# Patient Record
Sex: Male | Born: 1985 | Smoking: Current some day smoker
Health system: Southern US, Community
[De-identification: ages and names within clinical notes are randomized; demographics above are authoritative.]

## PROBLEM LIST (undated history)

## (undated) DIAGNOSIS — B20 Human immunodeficiency virus [HIV] disease: Secondary | ICD-10-CM

## (undated) DIAGNOSIS — E119 Type 2 diabetes mellitus without complications: Secondary | ICD-10-CM

## (undated) DIAGNOSIS — F209 Schizophrenia, unspecified: Secondary | ICD-10-CM

## (undated) DIAGNOSIS — Z21 Asymptomatic human immunodeficiency virus [HIV] infection status: Secondary | ICD-10-CM

## (undated) DIAGNOSIS — B029 Zoster without complications: Secondary | ICD-10-CM

## (undated) DIAGNOSIS — F319 Bipolar disorder, unspecified: Secondary | ICD-10-CM

## (undated) HISTORY — PX: IRRIGATION AND DEBRIDEMENT FOOT: SHX6602

## (undated) HISTORY — DX: Human immunodeficiency virus (HIV) disease: B20

## (undated) HISTORY — DX: Asymptomatic human immunodeficiency virus (hiv) infection status: Z21

---

## 2007-03-13 ENCOUNTER — Emergency Department (HOSPITAL_COMMUNITY): Admission: EM | Admit: 2007-03-13 | Discharge: 2007-03-13 | Payer: Self-pay | Admitting: Emergency Medicine

## 2016-08-14 ENCOUNTER — Encounter (INDEPENDENT_AMBULATORY_CARE_PROVIDER_SITE_OTHER): Payer: Self-pay

## 2016-08-14 ENCOUNTER — Ambulatory Visit (INDEPENDENT_AMBULATORY_CARE_PROVIDER_SITE_OTHER): Payer: No Typology Code available for payment source | Admitting: Family Medicine

## 2016-08-14 VITALS — BP 145/74 | HR 66 | Temp 97.4°F | Resp 18 | Ht 69.0 in | Wt 165.0 lb

## 2016-08-14 DIAGNOSIS — B029 Zoster without complications: Secondary | ICD-10-CM

## 2016-08-14 MED ORDER — VALACYCLOVIR HCL 1 G PO TABS
1000.0000 mg | ORAL_TABLET | Freq: Three times a day (TID) | ORAL | Status: AC
Start: 2016-08-14 — End: 2016-08-21

## 2016-08-14 NOTE — Patient Instructions (Signed)
Shingles (Herpes Zoster)     The shingles rash usually appears on just one side of the body.   Shingles is also called herpes zoster. Itis a painful skin rash caused by the herpes zoster virus. This is the same virus that causes chickenpox. After a personhas chickenpox, the virus remains inactive in the nerve cells. Years later, the virus can become active again and travel to the skin. Most people have shingles only once, but it is possible to have it more than once.  What are the risk factors for shingles?  Anyone who has ever had chickenpox can develop shingles. But your risk is greater if you:   STRESS   Take medicines that weaken your immune system  What are the symptoms of shingles?   The first sign of shingles is usually pain, burning, tingling, or itching on one part of your face or body. You may also feel as if you have the flu, with fever and chills.   A red rash with small blisters appears within a few days. The rash may appear as follows:   The blisters can occur anywhere, but they're most common on the back, chest, or abdomen.   They usually appear on only one side of the body, spreading along the nerve pathway where the virus was inactive.   The rash can also form around an eye, along one side of the face or neck, or in the mouth.   In a few people, usually those with weakened immune systems,shingles appear on more than one part of the body at once.   After a few days, the blisters become dry and form a crust. The crust falls off in days to weeks. The blisters generally do not leave scars.  How is shingles treated?  For most people, shingles heals on its own in a few weeks. But treatment is recommended tohelp relieve pain, speed healing, and reduce the risk of complications.Antiviral medicines are prescribed within the first 72 hours of the appearance of the rash.To lessen symptoms:   Apply ice packs (wrapped in a thin towel) or cool compresses, or soak in a cool bath.   Use  calamine lotion to calm itchy skin.   Ask your healthcare provider about over-the-counter pain relievers. If your pain is severe, your healthcare provider may prescribe stronger pain medicines.  What are the complicationsof shingles?  Shingles often goes away with no lasting effects. But some people have serious problems long after the blisters have healed:   Postherpetic neuralgia.This is the most common complication. It is severe nerve pain at the place where the rash used to be. It can last for months, or even years after you have had shingles. Medicines can be prescribed to help relieve the pain and improve quality of life.   Bacterial infection.Shingles blisters may become infected with bacteria. Antibiotic medicine is used to treat the infection.   Eye problems. A person with shingles on the face should see his or her healthcare provider right away. Shingles can cause serious problems with vision, and even blindness.  Very rarely shingles can also lead to pneumonia, hearing problems, brain inflammation, or even death.  When to seek medical care  Contact your healthcare provider if you experience any of the following:   Symptoms that don't go away with treatment   A rash or blisters near your eye   Increased drainage, fever, or rash after treatment, or severe pain that doesn't go away   How can shingles be prevented?  You can only get shingles if you have had chicken pox in the past. Those who have never had chickenpox can get the virus from you. Although instead of developing shingles, the person may get chickenpox. Until your blisters form scabs, avoid contact with others, especially the following:   Pregnant women who have never had chickenpox or the vaccine   Infants who were born early (prematurely) or who had low weight at birth   People with weak immune system (for example, people receiving chemotherapy for cancer, people who have had organ transplants, or people with HIV  infections)    The shingles vaccine  If you're 69 years of age or older, ask your healthcare provider if you should receive the shingles vaccine. The vaccine makes it less likely that you will develop shingles. If you do develop shingles, your symptoms will likely be milder than if you hadn't been vaccinated. Note: Although the vaccine is licensed for people 46 years of age or older, the CDC does not recommend the vaccine for those who are 76 to 29 years old.   Date Last Reviewed: 10/01/2015   2000-2016 [employer] [address], [place], [place] [address]. All rights reserved. This information is not intended as a substitute for professional medical care. Always follow your healthcare professional's instructions.    [employer]    [employer]   [employer]

## 2016-08-14 NOTE — Progress Notes (Signed)
[facility]  PROGRESS NOTE     Patient: David Carson   Date: 08/14/2016   MRN: [id number]       David Carson is a 30 y.o. male      SUBJECTIVE     Chief Complaint   Patient presents with   . Rash     C/o a a skin rash that began a couple of days ago, the site goes from the left pectoral muscle to the back and the arm. Papules, dry skin,          Rash  This is a new problem. The current episode started yesterday. The problem is unchanged. The affected locations include the torso, chest and left arm. The rash is characterized by pain, itchiness, redness and blistering. It is unknown if there was an exposure to a precipitant. Pertinent negatives include no congestion, cough, fatigue, fever, rhinorrhea, shortness of breath, sore throat or vomiting. Past treatments include nothing. The treatment provided no relief.       Review of Systems   Constitutional: Negative for fever, chills, diaphoresis and fatigue.   HENT: Negative for congestion, rhinorrhea, sneezing and sore throat.    Eyes: Negative for redness and itching.   Respiratory: Negative for cough and shortness of breath.    Cardiovascular: Negative for chest pain.   Gastrointestinal: Negative for nausea, vomiting and abdominal pain.   Genitourinary: Negative for difficulty urinating.   Musculoskeletal: Negative for myalgias, back pain, arthralgias and neck pain.   Skin: Positive for rash.   Neurological: Negative for dizziness, light-headedness, numbness and headaches.   Hematological: Negative for adenopathy.   Psychiatric/Behavioral: Negative for confusion and sleep disturbance. The patient is not nervous/anxious.        The following portions of the patient's history were reviewed and updated as appropriate: Allergies, Current Medications, Past Family History, Past Medical history, Past social history, Past surgical history, and Problem List.    OBJECTIVE     Vitals   Filed Vitals:    08/14/16 1244   BP: 145/74   Pulse: 66   Temp: 97.4 F (36.3 C)    TempSrc: Tympanic   Resp: 18   Height: 1.753 m (5\' 9" )   Weight: 74.844 kg (165 lb)   SpO2: 99%       Physical Exam   Nursing note and vitals reviewed.  Constitutional: He is oriented to person, place, and time. He appears well-developed and well-nourished. No distress.   Eyes: EOM are normal. Pupils are equal, round, and reactive to light.   Neck: Normal range of motion. Neck supple.   Musculoskeletal: Normal range of motion.   Lymphadenopathy:     He has no cervical adenopathy.   Neurological: He is alert and oriented to person, place, and time. No cranial nerve deficit.   Skin: Skin is warm and dry. Rash noted. He is not diaphoretic. No erythema. No pallor.   Thoracic - 2 or 3 Dermatome erythematous areas with vesicles and umbilication continued from spine to midline    No LN     Psychiatric: He has a normal mood and affect. Judgment and thought content normal.       Lab Results (24 Hour)   Results     ** No results found for the last 24 hours. **          Radiology Results (24 Hour)     ** No results found for the last 24 hours. **  ASSESSMENT     Encounter Diagnosis   Name Primary?   . Herpes zoster without complication Yes          PLAN     Procedures    1. Herpes zoster without complication  - valacyclovir (VALTREX) 1000 MG tablet; Take 1 tablet (1,000 mg total) by mouth 3 (three) times daily.  Dispense: 21 tablet; Refill: 0    An After Visit Summary was printed and given to the patient.  WD CARE    Patient to follow up with [facility] primary care doctor or GO TO THE E.R. if symptoms worsening or not resolving as expected.   Patient verbalized understanding.   Reviewed discharge instructions that are included here with patient, and printed in AVS. Questions answered.        Signed,  Daneil Dan, MD  08/14/2016

## 2018-11-18 ENCOUNTER — Other Ambulatory Visit: Payer: Self-pay

## 2018-11-18 ENCOUNTER — Encounter (HOSPITAL_COMMUNITY): Payer: Self-pay

## 2018-11-18 ENCOUNTER — Emergency Department (HOSPITAL_COMMUNITY)
Admission: EM | Admit: 2018-11-18 | Discharge: 2018-11-18 | Disposition: A | Discharge status: Home / Self Care | Payer: Self-pay | Attending: Emergency Medicine | Admitting: Emergency Medicine

## 2018-11-18 ENCOUNTER — Encounter (HOSPITAL_COMMUNITY): Payer: Self-pay | Admitting: Emergency Medicine

## 2018-11-18 DIAGNOSIS — F17200 Nicotine dependence, unspecified, uncomplicated: Secondary | ICD-10-CM | POA: Insufficient documentation

## 2018-11-18 DIAGNOSIS — Z7721 Contact with and (suspected) exposure to potentially hazardous body fluids: Secondary | ICD-10-CM | POA: Insufficient documentation

## 2018-11-18 DIAGNOSIS — B029 Zoster without complications: Secondary | ICD-10-CM | POA: Insufficient documentation

## 2018-11-18 HISTORY — DX: Zoster without complications: B02.9

## 2018-11-18 LAB — COMPREHENSIVE METABOLIC PANEL
ALK PHOS: 56 U/L (ref 38–126)
ALT: 23 U/L (ref 0–44)
AST: 34 U/L (ref 15–41)
Albumin: 5.2 g/dL — ABNORMAL HIGH (ref 3.5–5.0)
Anion gap: 8 (ref 5–15)
BILIRUBIN TOTAL: 0.6 mg/dL (ref 0.3–1.2)
BUN: 10 mg/dL (ref 6–20)
CALCIUM: 9.9 mg/dL (ref 8.9–10.3)
CO2: 26 mmol/L (ref 22–32)
Chloride: 104 mmol/L (ref 98–111)
Creatinine, Ser: 0.77 mg/dL (ref 0.61–1.24)
GFR calc Af Amer: >60 mL/min (ref >60)
GLUCOSE: 103 mg/dL — AB (ref 70–99)
POTASSIUM: 3.7 mmol/L (ref 3.5–5.1)
Sodium: 138 mmol/L (ref 135–145)
TOTAL PROTEIN: 10.8 g/dL — AB (ref 6.5–8.1)

## 2018-11-18 MED ORDER — ELVITEG-COBIC-EMTRICIT-TENOFAF 150-150-200-10 MG PO TABS: 1.0000 | ORAL_TABLET | Freq: Every day | ORAL | 0 refills | Status: DC

## 2018-11-18 MED ORDER — VALACYCLOVIR HCL 1 G PO TABS: 1000.0000 mg | ORAL_TABLET | Freq: Three times a day (TID) | ORAL | 0 refills | Status: AC

## 2018-11-18 MED ORDER — ELVITEG-COBIC-EMTRICIT-TENOFAF 150-150-200-10 MG PREPACK
5.0000 | ORAL_TABLET | Freq: Once | ORAL | Status: AC
  Administered 2018-11-18: 5 via ORAL
  Filled 2018-11-18: qty 5

## 2018-11-18 MED ORDER — GABAPENTIN 100 MG PO CAPS: 100.0000 mg | ORAL_CAPSULE | Freq: Three times a day (TID) | ORAL | 0 refills | Status: DC

## 2018-11-18 NOTE — Discharge Instructions (Signed)
Take prescribed postexposure prophylaxis medications regularly as directed, you will need to follow-up with infectious disease, you have lab work collected and pending and will be contacted if any abnormal results return.

## 2018-11-18 NOTE — Discharge Instructions (Addendum)
Take Valtrex and gabapentin as directed for the next week, if rash persists you will need to follow-up with a regular doctor for further evaluation.

## 2018-11-18 NOTE — ED Triage Notes (Signed)
Pt reports hx of rash on torso, intermittently x 1 years. Tx for shingles in the past. Pronounced rash on r/upper chest , extending to r/axillary area. Pt reports pain and itching

## 2018-11-18 NOTE — ED Provider Notes (Signed)
[facility]-EMERGENCY DEPT Provider Note   CSN: [id number] Arrival date & time: 11/18/18  1422     History   Chief Complaint Chief Complaint  Patient presents with  . Rash    HPI Mark Galloway is a 32 y.o. male.  Mark Galloway is a 32 y.o. Male with a history of shingles, who presents for evaluation of a rash that he noticed a few days ago history of similar rash in the past about a year ago that he was told was shingles, he was treated with antivirals and gabapentin with improvement.  Reports he usually sees a doctor in [place] but recently transition to the area.  He reports the rash is somewhat painful and he reports a tingling hypersensitive sensation over the area, occasional itching.  Describes it as several little bumps in a distribution across the right side of his chest and back.  No lesions elsewhere.  No visual symptoms, no headache, neck pain or stiffness.  He has not taken anything for the rash prior to arrival, denies any other aggravating or relieving factors.     Past Medical History:  Diagnosis Date  . Shingles     There are no active problems to display for this patient.   History reviewed. No pertinent surgical history.      Home Medications    Prior to Admission medications   Medication Sig Start Date End Date Taking? Authorizing Provider  gabapentin (NEURONTIN) 100 MG capsule Take 1 capsule (100 mg total) by mouth 3 (three) times daily for 7 days. 11/18/18 11/25/18  Dartha LodgeFord,  N, PA-C  valACYclovir (VALTREX) 1000 MG tablet Take 1 tablet (1,000 mg total) by mouth 3 (three) times daily for 7 days. 11/18/18 11/25/18  Dartha LodgeFord,  N, PA-C    Family History History reviewed. No pertinent family history.  Social History Social History   Tobacco Use  . Smoking status: Current Some Day Smoker  . Smokeless tobacco: Never Used  Substance Use Topics  . Alcohol use: Yes    Comment: weekly  . Drug use: Never     Allergies     Patient has no known allergies.   Review of Systems Review of Systems  Constitutional: Negative for chills and fever.  Eyes: Negative for visual disturbance.  Gastrointestinal: Negative for nausea and vomiting.  Skin: Positive for rash.  Neurological: Negative for weakness, numbness and headaches.  All other systems reviewed and are negative.    Physical Exam Updated Vital Signs BP (!) 145/80 (BP Location: Left Arm)   Pulse 90   Temp 98.8 F (37.1 C) (Oral)   Resp 16   Wt 76.2 kg   SpO2 100%   Physical Exam  Constitutional: He appears well-developed and well-nourished. No distress.  HENT:  Head: Normocephalic and atraumatic.  Eyes: Right eye exhibits no discharge. Left eye exhibits no discharge.  Cardiovascular: Normal rate, regular rhythm, normal heart sounds and intact distal pulses.  Pulmonary/Chest: Effort normal and breath sounds normal. No stridor. No respiratory distress. He has no wheezes. He has no rales.  Vesicular rash across the T4 dermatome that is mildly tender to palpation, consistent with shingles.  History of similar infection on the other side with hyperpigmentation from prior lesions noted  Neurological: He is alert. Coordination normal.  Skin: Skin is warm and dry. Capillary refill takes less than 2 seconds. Rash noted. He is not diaphoretic.  Rash as noted above  Psychiatric: He has a normal mood and affect. His behavior is  normal.  Nursing note and vitals reviewed.    ED Treatments / Results  Labs (all labs ordered are listed, but only abnormal results are displayed) Labs Reviewed - No data to display  EKG None  Radiology No results found.  Procedures Procedures (including critical care time)  Medications Ordered in ED Medications - No data to display   Initial Impression / Assessment and Plan / ED Course  I have reviewed the triage vital signs and the nursing notes.  Pertinent labs & imaging results that were available during my  care of the patient were reviewed by me and considered in my medical decision making (see chart for details).  Rash is tender with grouped clear vesicles on an erythematous base located along dermatome T4 unilaterally on the right.  Pt without signs of CNS involvement and there is no involvement of the face or eyes; no concern for opthalmic zoster.  Will discharge home with pain management and valtrex.  Also recommend calamine lotion and cool compresses as needed for pain control  Final Clinical Impressions(s) / ED Diagnoses   Final diagnoses:  Herpes zoster without complication    ED Discharge Orders         Ordered    valACYclovir (VALTREX) 1000 MG tablet  3 times daily     11/18/18 1451    gabapentin (NEURONTIN) 100 MG capsule  3 times daily     11/18/18 1451           Legrand Rams 11/18/18 1538    Azalia Bilis, MD 11/18/18 1631
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
## 2018-11-20 LAB — HEPATITIS PANEL, ACUTE
HCV AB: 0.2 {s_co_ratio} (ref 0.0–0.9)
HEP A IGM: NEGATIVE
HEP B S AG: NEGATIVE
Hep B C IgM: NEGATIVE

## 2018-11-20 LAB — HIV ANTIBODY (ROUTINE TESTING W REFLEX): HIV SCREEN 4TH GENERATION: REACTIVE — AB

## 2018-11-20 LAB — HIV 1/2 AB DIFFERENTIATION
HIV 1 Ab: POSITIVE — AB
HIV 2 Ab: UNDETERMINED

## 2018-11-20 LAB — RPR, QUANT+TP ABS (REFLEX)
Rapid Plasma Reagin, Quant: 1:1 {titer} — ABNORMAL HIGH
TREPONEMA PALLIDUM AB: NONREACTIVE

## 2018-11-20 LAB — RPR: RPR: REACTIVE — AB

## 2018-11-21 ENCOUNTER — Telehealth: Payer: Self-pay | Admitting: *Deleted

## 2018-11-21 NOTE — Telephone Encounter (Signed)
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

## 2018-11-21 NOTE — Telephone Encounter (Signed)
Information sent to DIS per Hatcher.

## 2018-12-25 LAB — HIV GENOSURE(R) MG

## 2018-12-25 LAB — REFLEX TO GENOSURE(R) MG EDI: HIV GenoSure(R): 1

## 2018-12-25 LAB — HIV-1 RNA, PCR (GRAPH) RFX/GENO EDI
HIV-1 RNA BY PCR: 21500 copies/mL
HIV-1 RNA QUANT, LOG: 4.332 {Log_copies}/mL

## 2020-03-22 ENCOUNTER — Ambulatory Visit (HOSPITAL_COMMUNITY)
Admission: AD | Admit: 2020-03-22 | Discharge: 2020-03-22 | Disposition: A | Discharge status: Home / Self Care | Payer: Self-pay | Attending: Psychiatry | Admitting: Psychiatry

## 2020-03-22 ENCOUNTER — Inpatient Hospital Stay (HOSPITAL_COMMUNITY): Admit: 2020-03-22 | Payer: Self-pay

## 2020-03-22 DIAGNOSIS — F419 Anxiety disorder, unspecified: Secondary | ICD-10-CM | POA: Insufficient documentation

## 2020-03-22 DIAGNOSIS — R52 Pain, unspecified: Secondary | ICD-10-CM | POA: Insufficient documentation

## 2020-03-22 DIAGNOSIS — B0229 Other postherpetic nervous system involvement: Secondary | ICD-10-CM | POA: Insufficient documentation
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
## 2020-03-25 ENCOUNTER — Encounter (HOSPITAL_COMMUNITY): Payer: Self-pay

## 2020-03-25 ENCOUNTER — Emergency Department (HOSPITAL_COMMUNITY)
Admission: EM | Admit: 2020-03-25 | Discharge: 2020-03-29 | Disposition: A | Discharge status: Other Acute Inpatient Hospital | Payer: Self-pay | Attending: Emergency Medicine | Admitting: Emergency Medicine

## 2020-03-25 ENCOUNTER — Other Ambulatory Visit: Payer: Self-pay

## 2020-03-25 DIAGNOSIS — F29 Unspecified psychosis not due to a substance or known physiological condition: Secondary | ICD-10-CM

## 2020-03-25 DIAGNOSIS — F312 Bipolar disorder, current episode manic severe with psychotic features: Secondary | ICD-10-CM | POA: Insufficient documentation

## 2020-03-25 DIAGNOSIS — R442 Other hallucinations: Secondary | ICD-10-CM | POA: Insufficient documentation

## 2020-03-25 DIAGNOSIS — F419 Anxiety disorder, unspecified: Secondary | ICD-10-CM

## 2020-03-25 DIAGNOSIS — Z20822 Contact with and (suspected) exposure to covid-19: Secondary | ICD-10-CM | POA: Insufficient documentation

## 2020-03-25 DIAGNOSIS — R441 Visual hallucinations: Secondary | ICD-10-CM

## 2020-03-25 DIAGNOSIS — F17200 Nicotine dependence, unspecified, uncomplicated: Secondary | ICD-10-CM | POA: Insufficient documentation

## 2020-03-25 DIAGNOSIS — F319 Bipolar disorder, unspecified: Secondary | ICD-10-CM

## 2020-03-25 DIAGNOSIS — Z79899 Other long term (current) drug therapy: Secondary | ICD-10-CM | POA: Insufficient documentation

## 2020-03-25 LAB — ACETAMINOPHEN LEVEL: Acetaminophen (Tylenol), Serum: <10 ug/mL — ABNORMAL LOW (ref 10–30)

## 2020-03-25 LAB — COMPREHENSIVE METABOLIC PANEL
ALT: 19 U/L (ref 0–44)
AST: 32 U/L (ref 15–41)
Albumin: 3.7 g/dL (ref 3.5–5.0)
Alkaline Phosphatase: 64 U/L (ref 38–126)
Anion gap: 6 (ref 5–15)
BUN: 8 mg/dL (ref 6–20)
CO2: 28 mmol/L (ref 22–32)
Calcium: 8.9 mg/dL (ref 8.9–10.3)
Chloride: 102 mmol/L (ref 98–111)
Creatinine, Ser: 0.77 mg/dL (ref 0.61–1.24)
GFR calc Af Amer: >60 mL/min (ref >60)
GFR calc non Af Amer: >60 mL/min (ref >60)
Glucose, Bld: 93 mg/dL (ref 70–99)
Potassium: 4.2 mmol/L (ref 3.5–5.1)
Sodium: 136 mmol/L (ref 135–145)
Total Bilirubin: 0.4 mg/dL (ref 0.3–1.2)
Total Protein: 8.8 g/dL — ABNORMAL HIGH (ref 6.5–8.1)

## 2020-03-25 LAB — CBC WITH DIFFERENTIAL/PLATELET
Abs Immature Granulocytes: 0.01 10*3/uL (ref 0.00–0.07)
Basophils Absolute: 0 10*3/uL (ref 0.0–0.1)
Basophils Relative: 1 %
Eosinophils Absolute: 0.1 10*3/uL (ref 0.0–0.5)
Eosinophils Relative: 3 %
HCT: 33.2 % — ABNORMAL LOW (ref 39.0–52.0)
Hemoglobin: 10.6 g/dL — ABNORMAL LOW (ref 13.0–17.0)
Immature Granulocytes: 0 %
Lymphocytes Relative: 33 %
Lymphs Abs: 1.1 10*3/uL (ref 0.7–4.0)
MCH: 31.9 pg (ref 26.0–34.0)
MCHC: 31.9 g/dL (ref 30.0–36.0)
MCV: 100 fL (ref 80.0–100.0)
Monocytes Absolute: 0.4 10*3/uL (ref 0.1–1.0)
Monocytes Relative: 13 %
Neutro Abs: 1.6 10*3/uL — ABNORMAL LOW (ref 1.7–7.7)
Neutrophils Relative %: 50 %
Platelets: 190 10*3/uL (ref 150–400)
RBC: 3.32 MIL/uL — ABNORMAL LOW (ref 4.22–5.81)
RDW: 12.4 % (ref 11.5–15.5)
WBC: 3.3 10*3/uL — ABNORMAL LOW (ref 4.0–10.5)
nRBC: 0 % (ref 0.0–0.2)

## 2020-03-25 LAB — URINALYSIS, ROUTINE W REFLEX MICROSCOPIC
Bilirubin Urine: NEGATIVE
Glucose, UA: NEGATIVE mg/dL
Hgb urine dipstick: NEGATIVE
Ketones, ur: NEGATIVE mg/dL
Nitrite: NEGATIVE
Protein, ur: NEGATIVE mg/dL
Specific Gravity, Urine: 1.003 — ABNORMAL LOW (ref 1.005–1.030)
pH: 7 (ref 5.0–8.0)

## 2020-03-25 LAB — SALICYLATE LEVEL: Salicylate Lvl: <7 mg/dL — ABNORMAL LOW (ref 7.0–30.0)

## 2020-03-25 LAB — CBG MONITORING, ED
Glucose-Capillary: 80 mg/dL (ref 70–99)
Glucose-Capillary: 68 mg/dL — ABNORMAL LOW (ref 70–99)
Glucose-Capillary: 116 mg/dL — ABNORMAL HIGH (ref 70–99)
Glucose-Capillary: 157 mg/dL — ABNORMAL HIGH (ref 70–99)
Glucose-Capillary: 95 mg/dL (ref 70–99)
Glucose-Capillary: 74 mg/dL (ref 70–99)
Glucose-Capillary: 238 mg/dL — ABNORMAL HIGH (ref 70–99)

## 2020-03-25 LAB — RAPID URINE DRUG SCREEN, HOSP PERFORMED
Amphetamines: NOT DETECTED
Barbiturates: NOT DETECTED
Benzodiazepines: NOT DETECTED
Cocaine: NOT DETECTED
Opiates: NOT DETECTED
Tetrahydrocannabinol: NOT DETECTED

## 2020-03-25 LAB — ETHANOL: Alcohol, Ethyl (B): <10 mg/dL (ref <10)

## 2020-03-25 LAB — RESPIRATORY PANEL BY RT PCR (FLU A&B, COVID)
Influenza A by PCR: NEGATIVE
Influenza B by PCR: NEGATIVE
SARS Coronavirus 2 by RT PCR: NEGATIVE

## 2020-03-25 MED ORDER — DEXTROSE 50 % IV SOLN
1.0000 | Freq: Once | INTRAVENOUS | Status: AC
  Administered 2020-03-25: 50 mL via INTRAVENOUS
  Filled 2020-03-25: qty 50

## 2020-03-25 MED ORDER — STERILE WATER FOR INJECTION IJ SOLN
INTRAMUSCULAR | Status: AC
  Filled 2020-03-25: qty 10

## 2020-03-25 MED ORDER — CEPHALEXIN 500 MG PO CAPS
500.0000 mg | ORAL_CAPSULE | Freq: Four times a day (QID) | ORAL | Status: DC
  Administered 2020-03-25 – 2020-03-29 (×16): 500 mg via ORAL
  Filled 2020-03-25 (×14): qty 1

## 2020-03-25 MED ORDER — CEPHALEXIN 500 MG PO CAPS
500.0000 mg | ORAL_CAPSULE | Freq: Once | ORAL | Status: AC
  Administered 2020-03-25: 500 mg via ORAL
  Filled 2020-03-25: qty 1

## 2020-03-25 MED ORDER — ZIPRASIDONE MESYLATE 20 MG IM SOLR
20.0000 mg | Freq: Once | INTRAMUSCULAR | Status: AC
  Administered 2020-03-25: 20 mg via INTRAMUSCULAR
  Filled 2020-03-25: qty 20

## 2020-03-25 MED ORDER — OLANZAPINE 10 MG PO TBDP
10.0000 mg | ORAL_TABLET | Freq: Every day | ORAL | Status: DC
  Administered 2020-03-25 – 2020-03-28 (×4): 10 mg via ORAL
  Filled 2020-03-25 (×4): qty 1

## 2020-03-25 MED ORDER — DEXTROSE 50 % IV SOLN
INTRAVENOUS | Status: AC
  Filled 2020-03-25: qty 50

## 2020-03-25 MED ORDER — LORAZEPAM 2 MG/ML IJ SOLN
1.0000 mg | Freq: Four times a day (QID) | INTRAMUSCULAR | Status: DC | PRN
  Administered 2020-03-25 – 2020-03-26 (×2): 1 mg via INTRAMUSCULAR
  Filled 2020-03-25 (×2): qty 1

## 2020-03-25 MED ORDER — LORAZEPAM 1 MG PO TABS
1.0000 mg | ORAL_TABLET | Freq: Four times a day (QID) | ORAL | Status: DC | PRN
  Administered 2020-03-27 – 2020-03-28 (×2): 1 mg via ORAL
  Filled 2020-03-25 (×2): qty 1

## 2020-03-25 NOTE — Progress Notes (Addendum)
Received Mark Galloway from the main ED, he is loud, arrogant and assigning the staff personal names. He attempted to leave the unit. He refused to use his bathroom in his room because it is small and stated he is claustrophobia. Security was called and he was directed back to his room. This Clinical research associate suggested he leave the bathroom door open to prevent from feeling closed in. He eventually drifted off to sleep. The sitter remains at the bedside.

## 2020-03-25 NOTE — ED Notes (Signed)
Pt interactions with staff has improved. He is asking for what he wants politely and not insulting people.
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
## 2020-03-25 NOTE — ED Notes (Signed)
Pt is disrespectful to staff and is asking sitter to "rub my feet with [employer]".

## 2020-03-25 NOTE — ED Notes (Addendum)
Pt became angry when this writer gave him lotion and suggested that he rub his own feet, that staff here will not be rubbing his feet. He said, "Get out of here you white-haired whore.."  He then got OOB and came out to further insult this Clinical research associate by saying, "You should be able to reach your old titties, bitch."
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
## 2020-03-25 NOTE — ED Notes (Signed)
Called another staff member a, "Faggot".

## 2020-03-25 NOTE — ED Provider Notes (Signed)
Patient apparently became quite agitated and required Geodon by psychiatry.  Now in handcuffs by police.  Will place in restraints.  Appears medically stable and is currently awaiting psychiatric admission.   Pricilla Loveless, MD 03/25/20 1258

## 2020-03-25 NOTE — BH Assessment (Addendum)
[facility] Assessment Progress Note  Per Fransisca Kaufmann, NP, this pt requires psychiatric hospitalization at this time.  Pt presents under IVC initiated by law enforcement and upheld by EDP Kristen Ward, DO.  The following facilities have been contacted to seek placement for this pt, with results as noted:  Beds available, information sent, decision pending: [facility] [place] [facility]  Unable to reach: [facility] (left message at 14:33) [facility] (left message at 15:01) [facility] (left message at 16:16)  At capacity: [facility] [place] [place] [facility]   [place], [place] [facility] Coordinator [phone]

## 2020-03-25 NOTE — ED Provider Notes (Signed)
Patient seen after reported history of falling off the toilet.  He has no acute injuries at this time.  Will monitor   Lorre Nick, MD 03/25/20 1645

## 2020-03-25 NOTE — Consult Note (Signed)
[facility] Consult   Reason for Consult:  Aggressive behavior  Referring Physician: EDP Patient Identification: Mark Galloway MRN:  [id number] Principal Diagnosis: Severe manic bipolar 1 disorder with psychotic behavior ([place]) Diagnosis:  Principal Problem:   Severe manic bipolar 1 disorder with psychotic behavior ([place])   Total Time spent with patient: 20 minutes  Subjective:   Deny Mark Galloway is a 34 y.o. male patient admitted with aggressive behaviors toward self. Patient states "I'm ready to go. You would be mad too if your mother would not let you into your own house."   HPI:    Per initial [facility] Assessment by Windell Hummingbird, Counselor 03/25/2020:   Mark Galloway is a 34 y.o. male who was brought to [facility] via [facility] and EMS due to his mother calling the police when pt was attempting to get into the home and his mother would not allow him, stating she was scared of him. Pt allegedly threw a grill into the back window and was arrested and being taken to jail when pt had an "unresponsive episode;" when pt awoke, he was screaming and kicking and requested a mental health assessment. The IVC states:  "Attempted to harm himself by pending head between cage and backseat of patrol car attempting to break his neck. Respondent told LEO that he had been hallucinating and suffers from anxiety and does not take his med. Respondent was released from [facility] on 03/23/3020 after checking himself out. Respondent was throwing himself around the backseat of the patrol car to injure his body."  Clinician inquired about the actions listed above; to each one, pt responded, "never," and, "no, baby," indicating that he would never behave in such a manner.  Pt denies SI or a hx of SI; he denies any previous hospitalizations for mental health reasons, any attempts to kill himself, or any current/prior plans to kill himself. Pt denies HI, AVH, NSSIB, access to guns/weapons, or SA. Pt  verified he was charged for some of his actions tonight and stated he has several upcoming court dates.  Pt stated his current therapist is "Dorita Fray," and when clinician inquired as to whether he has a real therapist, he repeated this. When clinician inquired as to whether pt has a therapist in [place], pt again stated "A[facility] Rose." Pt denied he has, or has had, a psychiatrist. Pt states he bought his mother's house today and that he has a home in [place] as well.  Pt's protective factors include that he denies HI and AVH.  Pt denies having supports that clinician can contact for collateral support at this time.  Pt is oriented x4. His recent and remote memory is UTA. Pt was cooperative throughout the assessment process, though it was evident that pt was not being forthright with information at times; for example, with Dorita Fray being his therapist and with pt not behaving in the way in which the IVC paperwork describes. Pt's insight is fair; his judgement and impulse control is poor at this time.   Diagnosis: F31.2, Bipolar I disorder, Current or most recent episode manic, With psychotic features    Per psychiatric assessment 03/25/2020 by Elmarie Shiley NP:  Patient was agitated during the assessment. He was dismissive of all events occurring prior to admission. He continued to blame his mother for his recent erratic behaviors. Patient requesting to leave the hospital. Nursing notes from this morning indicate that patient has continued to be verbally abusive to staff calling them degrading names. Writer noted  that his breakfast tray was thrown on the floor with milk splattered all over. Patient does not appear stable for discharge. He appears to be a danger to himself and others. The patient is noted to have very poor insight into his symptoms.    Past Psychiatric History: Denies   Risk to Self: Suicidal Ideation: No Suicidal Intent: No Is patient at risk for suicide?: No Suicidal  Plan?: No What has been your use of drugs/alcohol within the last 12 months?: Pt denies SA How many times?: 0 Other Self Harm Risks: Pt is currently delirius/in denial re: his behaviors Triggers for Past Attempts: None known Intentional Self Injurious Behavior: None Risk to Others: Homicidal Ideation: No Thoughts of Harm to Others: No Current Homicidal Intent: No Current Homicidal Plan: No Access to Homicidal Means: No(Pt denies access to guns/weapons) Identified Victim: None noted History of harm to others?: No(Pt denies hx of harm to others) Assessment of Violence: On admission Violent Behavior Description: Pt's mother states she is scared of him; was trying to get into her house, threw a grill through her back window Does patient have access to weapons?: No(Pt denies access to guns/weapons) Criminal Charges Pending?: Yes(Pt states he received charges for his actions tonight) Describe Pending Criminal Charges: Pt states he received charges for his actions tonight Does patient have a court date: Yes Court Date: (Multiple for his actions tonight) Prior Inpatient Therapy: Prior Inpatient Therapy: No Prior Outpatient Therapy: Prior Outpatient Therapy: No(Pt states, "Amber Rose.") Does patient have an ACCT team?: No Does patient have Intensive In-House Services?  : No Does patient have [facility] services? : No Does patient have P4CC services?: No  Past Medical History:  Past Medical History:  Diagnosis Date  . Shingles    History reviewed. No pertinent surgical history. Family History: No family history on file. Family Psychiatric  History: Patient unwilling to provide due to current level of agitation.  Social History:  Social History   Substance and Sexual Activity  Alcohol Use Yes   Comment: weekly     Social History   Substance and Sexual Activity  Drug Use Never    Social History   Socioeconomic History  . Marital status: Single    Spouse name: Not on file  . Number  of children: Not on file  . Years of education: Not on file  . Highest education level: Not on file  Occupational History  . Not on file  Tobacco Use  . Smoking status: Current Some Day Smoker  . Smokeless tobacco: Never Used  Substance and Sexual Activity  . Alcohol use: Yes    Comment: weekly  . Drug use: Never  . Sexual activity: Not Currently  Other Topics Concern  . Not on file  Social History Narrative  . Not on file   Social Determinants of Health   Financial Resource Strain:   . Difficulty of Paying Living Expenses:   Food Insecurity:   . Worried About Programme researcher, broadcasting/film/video in the Last Year:   . Barista in the Last Year:   Transportation Needs:   . Freight forwarder (Medical):   Marland Kitchen Lack of Transportation (Non-Medical):   Physical Activity:   . Days of Exercise per Week:   . Minutes of Exercise per Session:   Stress:   . Feeling of Stress :   Social Connections:   . Frequency of Communication with Friends and Family:   . Frequency of Social Gatherings with Friends and  Family:   . Attends Religious Services:   . Active Member of Clubs or Organizations:   . Attends Banker Meetings:   Marland Kitchen Marital Status:    Additional Social History:    Allergies:  No Known Allergies  Labs:  Results for orders placed or performed during the hospital encounter of 03/25/20 (from the past 48 hour(s))  Respiratory Panel by RT PCR (Flu A&B, Covid) - Nasopharyngeal Swab     Status: None   Collection Time: 03/25/20  3:08 AM   Specimen: Nasopharyngeal Swab  Result Value Ref Range   SARS Coronavirus 2 by RT PCR NEGATIVE NEGATIVE    Comment: (NOTE) SARS-CoV-2 target nucleic acids are NOT DETECTED. The SARS-CoV-2 RNA is generally detectable in upper respiratoy specimens during the acute phase of infection. The lowest concentration of SARS-CoV-2 viral copies this assay can detect is 131 copies/mL. A negative result does not preclude SARS-Cov-2 infection and  should not be used as the sole basis for treatment or other patient management decisions. A negative result may occur with  improper specimen collection/handling, submission of specimen other than nasopharyngeal swab, presence of viral mutation(s) within the areas targeted by this assay, and inadequate number of viral copies (<131 copies/mL). A negative result must be combined with clinical observations, patient history, and epidemiological information. The expected result is Negative. Fact Sheet for Patients:  https://www.moore.com/ Fact Sheet for Healthcare Providers:  https://www.young.biz/ This test is not yet ap proved or cleared by the [place] FDA and  has been authorized for detection and/or diagnosis of SARS-CoV-2 by FDA under an Emergency Use Authorization (EUA). This EUA will remain  in effect (meaning this test can be used) for the duration of the COVID-19 declaration under Section 564(b)(1) of the Act, 21 U.S.C. section 360bbb-3(b)(1), unless the authorization is terminated or revoked sooner.    Influenza A by PCR NEGATIVE NEGATIVE   Influenza B by PCR NEGATIVE NEGATIVE    Comment: (NOTE) The Xpert Xpress SARS-CoV-2/FLU/RSV assay is intended as an aid in  the diagnosis of influenza from Nasopharyngeal swab specimens and  should not be used as a sole basis for treatment. Nasal washings and  aspirates are unacceptable for Xpert Xpress SARS-CoV-2/FLU/RSV  testing. Fact Sheet for Patients: https://www.moore.com/ Fact Sheet for Healthcare Providers: https://www.young.biz/ This test is not yet approved or cleared by the [place] FDA and  has been authorized for detection and/or diagnosis of SARS-CoV-2 by  FDA under an Emergency Use Authorization (EUA). This EUA will remain  in effect (meaning this test can be used) for the duration of the  Covid-19 declaration under Section 564(b)(1) of  the Act, 21  U.S.C. section 360bbb-3(b)(1), unless the authorization is  terminated or revoked. Performed at [facility], [address], [place], [place] [address]   Comprehensive metabolic panel     Status: Abnormal   Collection Time: 03/25/20  3:08 AM  Result Value Ref Range   Sodium 136 135 - 145 mmol/L   Potassium 4.2 3.5 - 5.1 mmol/L   Chloride 102 98 - 111 mmol/L   CO2 28 22 - 32 mmol/L   Glucose, Bld 93 70 - 99 mg/dL    Comment: Glucose reference range applies only to samples taken after fasting for at least 8 hours.   BUN 8 6 - 20 mg/dL   Creatinine, Ser 5.95 0.61 - 1.24 mg/dL   Calcium 8.9 8.9 - 63.8 mg/dL   Total Protein 8.8 (H) 6.5 - 8.1 g/dL  Albumin 3.7 3.5 - 5.0 g/dL   AST 32 15 - 41 U/L   ALT 19 0 - 44 U/L   Alkaline Phosphatase 64 38 - 126 U/L   Total Bilirubin 0.4 0.3 - 1.2 mg/dL   GFR calc non Af Amer >60 >60 mL/min   GFR calc Af Amer >60 >60 mL/min   Anion gap 6 5 - 15    Comment: Performed at [facility], [address], [place], [place] [address]  Ethanol     Status: None   Collection Time: 03/25/20  3:08 AM  Result Value Ref Range   Alcohol, Ethyl (B) <10 <10 mg/dL    Comment: (NOTE) Lowest detectable limit for serum alcohol is 10 mg/dL. For medical purposes only. Performed at [facility], [address] [place], [place] [address]   Urine rapid drug screen (hosp performed)     Status: None   Collection Time: 03/25/20  3:08 AM  Result Value Ref Range   Opiates NONE DETECTED NONE DETECTED   Cocaine NONE DETECTED NONE DETECTED   Benzodiazepines NONE DETECTED NONE DETECTED   Amphetamines NONE DETECTED NONE DETECTED   Tetrahydrocannabinol NONE DETECTED NONE DETECTED   Barbiturates NONE DETECTED NONE DETECTED    Comment: (NOTE) DRUG SCREEN FOR MEDICAL PURPOSES ONLY.  IF CONFIRMATION IS NEEDED FOR ANY PURPOSE, NOTIFY LAB WITHIN 5 DAYS. LOWEST DETECTABLE LIMITS FOR URINE DRUG SCREEN Drug  Class                     Cutoff (ng/mL) Amphetamine and metabolites    1000 Barbiturate and metabolites    200 Benzodiazepine                 200 Tricyclics and metabolites     300 Opiates and metabolites        300 Cocaine and metabolites        300 THC                            50 Performed at [facility], [address] [place], [place] [address]   CBC with Diff     Status: Abnormal   Collection Time: 03/25/20  3:08 AM  Result Value Ref Range   WBC 3.3 (L) 4.0 - 10.5 K/uL   RBC 3.32 (L) 4.22 - 5.81 MIL/uL   Hemoglobin 10.6 (L) 13.0 - 17.0 g/dL   HCT 91.4 (L) 78.2 - 95.6 %   MCV 100.0 80.0 - 100.0 fL   MCH 31.9 26.0 - 34.0 pg   MCHC 31.9 30.0 - 36.0 g/dL   RDW 21.3 08.6 - 57.8 %   Platelets 190 150 - 400 K/uL   nRBC 0.0 0.0 - 0.2 %   Neutrophils Relative % 50 %   Neutro Abs 1.6 (L) 1.7 - 7.7 K/uL   Lymphocytes Relative 33 %   Lymphs Abs 1.1 0.7 - 4.0 K/uL   Monocytes Relative 13 %   Monocytes Absolute 0.4 0.1 - 1.0 K/uL   Eosinophils Relative 3 %   Eosinophils Absolute 0.1 0.0 - 0.5 K/uL   Basophils Relative 1 %   Basophils Absolute 0.0 0.0 - 0.1 K/uL   Immature Granulocytes 0 %   Abs Immature Granulocytes 0.01 0.00 - 0.07 K/uL   Reactive, Benign Lymphocytes PRESENT     Comment: Performed at [facility], [address] [place], [place] [address]  Salicylate  level     Status: Abnormal   Collection Time: 03/25/20  3:08 AM  Result Value Ref Range   Salicylate Lvl <7.0 (L) 7.0 - 30.0 mg/dL    Comment: Performed at [facility], [address] [place], [place] [address]  Acetaminophen level     Status: Abnormal   Collection Time: 03/25/20  3:08 AM  Result Value Ref Range   Acetaminophen (Tylenol), Serum <10 (L) 10 - 30 ug/mL    Comment: (NOTE) Therapeutic concentrations vary significantly. A range of 10-30 ug/mL  may be an effective concentration for many patients. However, some  are best treated at  concentrations outside of this range. Acetaminophen concentrations >150 ug/mL at 4 hours after ingestion  and >50 ug/mL at 12 hours after ingestion are often associated with  toxic reactions. Performed at [facility], [address], [place], [place] [address]   Urinalysis, Routine w reflex microscopic     Status: Abnormal   Collection Time: 03/25/20  3:08 AM  Result Value Ref Range   Color, Urine STRAW (A) YELLOW   APPearance CLEAR CLEAR   Specific Gravity, Urine 1.003 (L) 1.005 - 1.030   pH 7.0 5.0 - 8.0   Glucose, UA NEGATIVE NEGATIVE mg/dL   Hgb urine dipstick NEGATIVE NEGATIVE   Bilirubin Urine NEGATIVE NEGATIVE   Ketones, ur NEGATIVE NEGATIVE mg/dL   Protein, ur NEGATIVE NEGATIVE mg/dL   Nitrite NEGATIVE NEGATIVE   Leukocytes,Ua MODERATE (A) NEGATIVE   RBC / HPF 0-5 0 - 5 RBC/hpf   WBC, UA 21-50 0 - 5 WBC/hpf   Bacteria, UA RARE (A) NONE SEEN    Comment: Performed at [facility] [address], [place], [place] [address]    Current Facility-Administered Medications  Medication Dose Route Frequency Provider Last Rate Last Admin  . cephALEXin (KEFLEX) capsule 500 mg  500 mg Oral Q6H Muthersbaugh, Hannah, PA-C       Current Outpatient Medications  Medication Sig Dispense Refill  . elvitegravir-cobicistat-emtricitabine-tenofovir (GENVOYA) 150-150-200-10 MG TABS tablet Take 1 tablet by mouth daily with breakfast. (Patient not taking: Reported on 03/25/2020) 30 tablet 0  . gabapentin (NEURONTIN) 100 MG capsule Take 1 capsule (100 mg total) by mouth 3 (three) times daily for 7 days. 21 capsule 0    Musculoskeletal: Resting in bed during assessment, unable to assess   Psychiatric Specialty Exam: Physical Exam  Constitutional: He is oriented to person, place, and time.  Neurological: He is alert and oriented to person, place, and time.  Psychiatric: His affect is angry and labile. His speech is rapid and/or pressured. He is agitated  and aggressive. Thought content is paranoid. Cognition and memory are impaired. He expresses impulsivity.    Review of Systems  Blood pressure 112/77, pulse 81, temperature 98.6 F (37 C), temperature source Oral, resp. rate 19, height 5\' 9"  (1.753 m), weight 74.8 kg, SpO2 94 %.Body mass index is 24.37 kg/m.  General Appearance: Disheveled  Eye Contact:  Minimal  Speech:  Pressured  Volume:  Increased  Mood:  Angry and Irritable  Affect:  Labile  Thought Process:  Coherent  Orientation:  Full (Time, Place, and Person)  Thought Content:  Rumination  Suicidal Thoughts:  No Patient denies but IVC paperwork from [employer] indicates patient self harmed in the police car  Homicidal Thoughts:  No Notes in chart indicate patient has been aggressive recently (threw a grill and broke a window)  Memory:  Immediate;   Fair Recent;  Poor Remote;   Poor  Judgement:  Impaired  Insight:  Lacking  Psychomotor Activity:  Restlessness  Concentration:  Concentration: Fair and Attention Span: Fair  Recall:  FiservFair  Fund of Knowledge:  Fair  Language:  Good  Akathisia:  No  Handed:  Right  AIMS (if indicated):     Assets:  Manufacturing systems engineerCommunication Skills Housing Leisure Time Physical Health Resilience Social Support  ADL's:  Intact  Cognition:  WNL  Sleep:        Treatment Plan Summary: Daily contact with patient to assess and evaluate symptoms and progress in treatment, Medication management and Plan Admit to psychiatric inpatient for mood stabilization.    Start Zyprexa Zydis 10 mg 1 po hs for agitation, Bipolar 1 symptoms. Reviewed most recent EKG that shows QT interval of 376.   Disposition: Recommend psychiatric Inpatient admission when medically cleared.  Fransisca KaufmannAVIS, Weaver Tweed, NP 03/25/2020 11:26 AM

## 2020-03-25 NOTE — ED Notes (Signed)
Pt taken out of seclusion to go to the bathroom.

## 2020-03-25 NOTE — ED Notes (Signed)
Patient changed into purple scrubs, all personal belongings placed into belongings bag. Patient wanded by security and belongings bag put into locket #27. Patient cooperating at this time.

## 2020-03-25 NOTE — ED Notes (Signed)
TTS machine at bedside. 

## 2020-03-25 NOTE — ED Triage Notes (Addendum)
Per EMS, patient was being transported by [facility] PD when he became unresponsive. Police stopped vehicle to see what was going on, then patient attempted to kick out the car window. [employer] then called EMS, who transported patient here. Per EMS, patient was very agitated upon arrival and has been having frequent mood swings. Patient in custody.

## 2020-03-26 MED ORDER — HALOPERIDOL LACTATE 5 MG/ML IJ SOLN
10.0000 mg | Freq: Once | INTRAMUSCULAR | Status: AC
  Administered 2020-03-26: 10 mg via INTRAMUSCULAR
  Filled 2020-03-26: qty 2

## 2020-03-26 NOTE — ED Provider Notes (Signed)
Emergency Medicine Observation Re-evaluation Note  Mark Galloway is a 34 y.o. male, seen on rounds today.  Pt initially presented to the ED for complaints of Medical Clearance Currently, the patient is awaiting placement..  Physical Exam  BP (!) 97/57 (BP Location: Right Arm)   Pulse 72   Temp 99.3 F (37.4 C) (Oral)   Resp 18   Ht 1.753 m (5\' 9" )   Wt 74.8 kg   SpO2 98%   BMI 24.37 kg/m  Physical Exam deferred ED Course / MDM  EKG:EKG Interpretation  Date/Time:  Friday March 25 2020 04:29:12 EDT Ventricular Rate:  77 PR Interval:    QRS Duration: 92 QT Interval:  376 QTC Calculation: 426 R Axis:   41 Text Interpretation: Sinus rhythm RSR' in V1 or V2, probably normal variant LVH by voltage ST elev, probable normal early repol pattern No old tracing to compare Confirmed by Ward, 12-17-1969 [phone]) on 03/25/2020 4:36:43 AM   I have reviewed the labs performed to date as well as medications administered while in observation.  Recent changes in the last 24 hours include being treated for uti.  Awaiting placement.  In restraints yesterday.. Plan  Current plan is for inpatient tx.    03/27/2020, MD 03/26/20 [phone]
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
## 2020-03-27 NOTE — ED Notes (Addendum)
Pt is sleeping at this time. Will defer vital check until patient is awake.

## 2020-03-27 NOTE — ED Notes (Signed)
Assumed care at 11am.  Pt resting with sitter at bedside.  Will continue to monitor. 

## 2020-03-27 NOTE — Progress Notes (Signed)
Patient continues to meet criteria for inpatient treatment per Berneice Heinrich, NP. No appropriate beds at [facility] currently. CSW faxed referrals to the following facilities for review:  [name]   [facility]   [facility]   [facility]   [facility]   [facility]   [facility]   [facility]   [facility]   [facility]   [facility]      TTS will continue to seek bed placement.     Ruthann Cancer MSW, [facility] Social Worker Disposition  [facility] Ph: [phone] Fax: [phone] 03/27/2020 9:19 AM

## 2020-03-27 NOTE — ED Notes (Signed)
Pt continues to sleep in room. No acute signs of distress noted.

## 2020-03-28 ENCOUNTER — Encounter (HOSPITAL_COMMUNITY): Payer: Self-pay | Admitting: Registered Nurse

## 2020-03-28 DIAGNOSIS — F419 Anxiety disorder, unspecified: Secondary | ICD-10-CM | POA: Insufficient documentation
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
## 2020-03-28 NOTE — ED Provider Notes (Signed)
Emergency Medicine Observation Re-evaluation Note  Mark Galloway is a 34 y.o. male, seen on rounds today.  Pt initially presented to the ED for complaints of Medical Clearance Currently, the patient is awaiting placement.  Physical Exam  BP (!) 104/55 (BP Location: Left Arm)   Pulse 60   Temp 98.6 F (37 C) (Oral)   Resp 17   Ht 1.753 m (5\' 9" )   Wt 74.8 kg   SpO2 99%   BMI 24.37 kg/m  Physical Exam  ED Course / MDM  EKG:EKG Interpretation  Date/Time:  Friday March 25 2020 04:29:12 EDT Ventricular Rate:  77 PR Interval:    QRS Duration: 92 QT Interval:  376 QTC Calculation: 426 R Axis:   41 Text Interpretation: Sinus rhythm RSR' in V1 or V2, probably normal variant LVH by voltage ST elev, probable normal early repol pattern No old tracing to compare Confirmed by Ward, 12-17-1969 [phone]) on 03/25/2020 4:36:43 AM  Clinical Course as of Mar 28 826  Fri Mar 25, 2020  0341 Anemia noted.  No previous values in our system.  Hemoglobin(!): 10.6 [HM]  0341 Moderate leukocytes with white blood cells.  Patient does report several days of urinary urgency.  We will treat for UTI.  0342Glori Luis): MODERATE [HM]    Clinical Course User Index [HM] Muthersbaugh, Marland Kitchen   I have reviewed the labs performed to date as well as medications administered while in observation.  Recent changes in the last 24 hours include more calm. Plan  Current plan is for inpatient psychiatric treatment. Patient is under full IVC at this time.   Boyd Kerbs, MD 03/28/20 [id number]
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
## 2020-03-29 ENCOUNTER — Other Ambulatory Visit: Payer: Self-pay

## 2020-03-29 ENCOUNTER — Inpatient Hospital Stay
Admission: EM | Admit: 2020-03-29 | Discharge: 2020-04-08 | DRG: 885 | Disposition: A | Discharge status: Home / Self Care | Payer: Self-pay | Source: Intra-hospital | Attending: Psychiatry | Admitting: Psychiatry

## 2020-03-29 ENCOUNTER — Encounter: Payer: Self-pay | Admitting: Behavioral Health

## 2020-03-29 DIAGNOSIS — Z79899 Other long term (current) drug therapy: Secondary | ICD-10-CM

## 2020-03-29 DIAGNOSIS — F419 Anxiety disorder, unspecified: Secondary | ICD-10-CM

## 2020-03-29 DIAGNOSIS — F23 Brief psychotic disorder: Secondary | ICD-10-CM | POA: Diagnosis present

## 2020-03-29 DIAGNOSIS — A530 Latent syphilis, unspecified as early or late: Secondary | ICD-10-CM

## 2020-03-29 DIAGNOSIS — D638 Anemia in other chronic diseases classified elsewhere: Secondary | ICD-10-CM | POA: Diagnosis present

## 2020-03-29 DIAGNOSIS — Z21 Asymptomatic human immunodeficiency virus [HIV] infection status: Secondary | ICD-10-CM

## 2020-03-29 DIAGNOSIS — F29 Unspecified psychosis not due to a substance or known physiological condition: Secondary | ICD-10-CM | POA: Diagnosis present

## 2020-03-29 DIAGNOSIS — K5900 Constipation, unspecified: Secondary | ICD-10-CM | POA: Diagnosis present

## 2020-03-29 DIAGNOSIS — F312 Bipolar disorder, current episode manic severe with psychotic features: Secondary | ICD-10-CM

## 2020-03-29 DIAGNOSIS — F319 Bipolar disorder, unspecified: Secondary | ICD-10-CM | POA: Diagnosis present

## 2020-03-29 DIAGNOSIS — F322 Major depressive disorder, single episode, severe without psychotic features: Secondary | ICD-10-CM | POA: Diagnosis present

## 2020-03-29 DIAGNOSIS — B20 Human immunodeficiency virus [HIV] disease: Secondary | ICD-10-CM

## 2020-03-29 DIAGNOSIS — F17200 Nicotine dependence, unspecified, uncomplicated: Secondary | ICD-10-CM | POA: Diagnosis present

## 2020-03-29 MED ORDER — ACETAMINOPHEN 325 MG PO TABS
650.0000 mg | ORAL_TABLET | Freq: Four times a day (QID) | ORAL | Status: DC | PRN
  Administered 2020-03-29 – 2020-04-07 (×20): 650 mg via ORAL
  Filled 2020-03-29 (×21): qty 2

## 2020-03-29 MED ORDER — GABAPENTIN 100 MG PO CAPS
100.0000 mg | ORAL_CAPSULE | Freq: Three times a day (TID) | ORAL | Status: DC
  Administered 2020-03-30 (×2): 100 mg via ORAL
  Filled 2020-03-29 (×2): qty 1

## 2020-03-29 MED ORDER — OLANZAPINE 10 MG PO TBDP: 10.0000 mg | ORAL_TABLET | Freq: Every day | ORAL | 0 refills | Status: DC

## 2020-03-29 MED ORDER — HYDROXYZINE HCL 25 MG PO TABS
25.0000 mg | ORAL_TABLET | Freq: Four times a day (QID) | ORAL | Status: DC | PRN
  Administered 2020-03-29 – 2020-04-07 (×9): 25 mg via ORAL
  Filled 2020-03-29 (×9): qty 1

## 2020-03-29 MED ORDER — ALUM & MAG HYDROXIDE-SIMETH 200-200-20 MG/5ML PO SUSP
30.0000 mL | ORAL | Status: DC | PRN
  Administered 2020-04-02: 16:00:00 30 mL via ORAL
  Filled 2020-03-29: qty 30

## 2020-03-29 MED ORDER — TRAZODONE HCL 50 MG PO TABS
50.0000 mg | ORAL_TABLET | Freq: Every evening | ORAL | Status: DC | PRN
  Administered 2020-03-29 – 2020-04-07 (×8): 50 mg via ORAL
  Filled 2020-03-29 (×9): qty 1

## 2020-03-29 MED ORDER — OLANZAPINE 5 MG PO TBDP
10.0000 mg | ORAL_TABLET | Freq: Every day | ORAL | Status: DC
  Administered 2020-03-29: 10 mg via ORAL
  Filled 2020-03-29: qty 2

## 2020-03-29 MED ORDER — MAGNESIUM HYDROXIDE 400 MG/5ML PO SUSP: 30.0000 mL | Freq: Every day | ORAL | Status: DC | PRN

## 2020-03-29 MED ORDER — CEPHALEXIN 500 MG PO CAPS: 500.0000 mg | ORAL_CAPSULE | Freq: Four times a day (QID) | ORAL | 0 refills | Status: DC
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
## 2020-03-29 NOTE — ED Notes (Signed)
Patient is appropriate, resting in bed quietly.

## 2020-03-29 NOTE — Plan of Care (Signed)
Pt new to the unit tonight  Problem: Education: Goal: Knowledge of [facility] Education information/materials will improve Outcome: Not Progressing Goal: Emotional status will improve Outcome: Not Progressing Goal: Mental status will improve Outcome: Not Progressing Goal: Verbalization of understanding the information provided will improve Outcome: Not Progressing   Problem: Safety: Goal: Periods of time without injury will increase Outcome: Not Progressing   Problem: Activity: Goal: Will verbalize the importance of balancing activity with adequate rest periods Outcome: Not Progressing   Problem: Safety: Goal: Ability to redirect hostility and anger into socially appropriate behaviors will improve Outcome: Not Progressing Goal: Ability to remain free from injury will improve Outcome: Not Progressing   

## 2020-03-29 NOTE — BH Assessment (Signed)
[facility] Assessment Progress Note  Per Nelly Rout, MD, this pt continues to require psychiatric hospitalization at this time.  Pt remains under IVC initiated by EDP Kristen Ward, DO.  Pt has been recommended for review by [facility].  As of this writing, decision is pending.  Doylene Canning, [place] Behavioral Health Coordinator [phone]

## 2020-03-29 NOTE — Discharge Summary (Addendum)
  Patient is to be transferred to [facility] for inpatient psychiatric treatment  

## 2020-03-30 DIAGNOSIS — F312 Bipolar disorder, current episode manic severe with psychotic features: Principal | ICD-10-CM

## 2020-03-30 DIAGNOSIS — Z21 Asymptomatic human immunodeficiency virus [HIV] infection status: Secondary | ICD-10-CM

## 2020-03-30 DIAGNOSIS — B20 Human immunodeficiency virus [HIV] disease: Secondary | ICD-10-CM

## 2020-03-30 MED ORDER — DOCUSATE SODIUM 100 MG PO CAPS: 100.0000 mg | ORAL_CAPSULE | Freq: Two times a day (BID) | ORAL | Status: DC

## 2020-03-30 MED ORDER — DOCUSATE SODIUM 100 MG PO CAPS
100.0000 mg | ORAL_CAPSULE | Freq: Two times a day (BID) | ORAL | Status: DC
  Administered 2020-03-30 – 2020-04-08 (×19): 100 mg via ORAL
  Filled 2020-03-30 (×21): qty 1

## 2020-03-30 MED ORDER — HALOPERIDOL LACTATE 5 MG/ML IJ SOLN: 5.0000 mg | Freq: Four times a day (QID) | INTRAMUSCULAR | Status: DC | PRN

## 2020-03-30 MED ORDER — OLANZAPINE 5 MG PO TBDP
10.0000 mg | ORAL_TABLET | Freq: Two times a day (BID) | ORAL | Status: DC
  Administered 2020-03-30 – 2020-04-01 (×4): 10 mg via ORAL
  Filled 2020-03-30 (×5): qty 2

## 2020-03-30 MED ORDER — HALOPERIDOL 5 MG PO TABS
5.0000 mg | ORAL_TABLET | Freq: Four times a day (QID) | ORAL | Status: DC | PRN
  Administered 2020-03-31: 21:00:00 5 mg via ORAL
  Filled 2020-03-30 (×2): qty 1

## 2020-03-30 NOTE — Progress Notes (Signed)
Pt has calmed down since the sitter order was put in place. Torrie Mayers RN

## 2020-03-30 NOTE — H&P (Signed)
Psychiatric Admission Assessment Adult  Patient Identification: Mark Galloway MRN:  [id number] Date of Evaluation:  03/30/2020 Chief Complaint:  MDD (major depressive disorder), severe ([name]) [F32.2] Principal Diagnosis: Severe manic bipolar 1 disorder with psychotic behavior ([name]) Diagnosis:  Principal Problem:   Severe manic bipolar 1 disorder with psychotic behavior ([name]) Active Problems:   Anxiety   Psychosis ([name])   HIV positive ([name])  History of Present Illness: Patient seen chart reviewed.  34 year old man brought under IVC to [facility] by Patent examiner.  The report from law enforcement is that the patient's mother called him to her home because the patient was becoming agitated trying to break into the house by throwing objects through a window and that she was becoming frightened.  Police took the patient into custody and report that he behaved dangerously banging his head trying to harm himself talking crazy in the car and when they took him in.  They brought him to the hospital and initiated the IVC.  In the emergency room in [facility] patient also appears to have been disorganized and hyperactive and was thought to be having manic symptoms.  In interview with me today the patient does not acknowledge any of his behavior as being symptoms.  He says that he lives in [place] and is staying with his mother in [place] for the past month.  Patient reports that he purchased his mother a new house and that the house he was breaking into is actually his own.  Also says that he bought 2 cars out right on the same day.  He says that his grandfather died recently and that he and his mother were having a dispute over whether or not he could have a party at her house to celebrate his grandfather.  Patient's interview becomes increasingly disorganized as we go on.  He contradicts himself multiple times especially about medical history.  He is hyperverbal and pressured in his speech throughout  the interview and difficult to redirect.  He appears grandiose and loud and uses inappropriately intimate language throughout the interview.  Patient claims that he has never seen any kind of mental health provider before.  I pointed out to him the positive HIV and RPR tests from 2019 and he had first denied any knowledge of it then immediately reversed himself saying that he was well aware of it.  Then he said that he takes medicine for it and then immediately reversed himself saying he really never taken any medicine for it.  Patient denies any drug or alcohol use and there was no evidence of that on admission.  Information in the chart shows that the family says he has been showing increasingly bizarre behavior unlike himself since being in [place] this past month.  This is the second time they had him brought to the hospital for psychiatric evaluation.  I was unable to reach any family for collateral information Associated Signs/Symptoms: Depression Symptoms:  psychomotor agitation, (Hypo) Manic Symptoms:  Delusions, Elevated Mood, Flight of Ideas, Licensed conveyancer, Grandiosity, Impulsivity, Irritable Mood, Labiality of Mood, Sexually Inapproprite Behavior, Anxiety Symptoms:  None specifically reported Psychotic Symptoms:  Delusions, Paranoia, PTSD Symptoms: Negative Total Time spent with patient: 1 hour  Past Psychiatric History: Little information available.  The patient claims he has never seen any kind of mental health provider in the past and claims to have never been prescribed any psychiatric medicine.  Notes report that he had said in [place] that he had a therapist named Restaurant manager, fast food  Rose".  When I asked him about that today he came up with some odd tangential excuse for it.  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  I do not know if that is  true or what other follow-up there is to that.  Is the patient at risk to self? Yes.    Has the patient been a risk to self in the past 6 months? Yes.    Has the patient been a risk to self within the distant past? No.  Is the patient a risk to others? Yes.    Has the patient been a risk to others in the past 6 months? Yes.    Has the patient been a risk to others within the distant past? No.   Prior Inpatient Therapy:   Prior Outpatient Therapy:    Alcohol Screening: 1. How often do you have a drink containing alcohol?: Never 2. How many drinks containing alcohol do you have on a typical day when you are drinking?: 1 or 2 3. How often do you have six or more drinks on one occasion?: Never AUDIT-C Score: 0 4. How often during the last year have you found that you were not able to stop drinking once you had started?: Never 5. How often during the last year have you failed to do what was normally expected from you becasue of drinking?: Never 6. How often during the last year have you needed a first drink in the morning to get yourself going after a heavy drinking session?: Never 7. How often during the last year have you had a feeling of guilt of remorse after drinking?: Never 8. How often during the last year have you been unable to remember what happened the night before because you had been drinking?: Never 9. Have you or someone else been injured as a result of your drinking?: No 10. Has a relative or friend or a doctor or another health worker been concerned about your drinking or suggested you cut down?: No Alcohol Use Disorder Identification Test Final Score (AUDIT): 0 Alcohol Brief Interventions/Follow-up: AUDIT Score <7 follow-up not indicated Substance Abuse History in the last 12 months:  No. Consequences of Substance Abuse: Negative Previous Psychotropic Medications: No  Psychological Evaluations: No  Past Medical History:  Past Medical History:  Diagnosis Date  . Shingles     History reviewed. No pertinent surgical history. Family History: History reviewed. No pertinent family history. Family Psychiatric  History: The patient tells me he thinks that people in his family have "schizo bipolar disorder".  I asked him how he knew that he said that they just act funny. Tobacco Screening: Have you used any form of tobacco in the last 30 days? (Cigarettes, Smokeless Tobacco, Cigars, and/or Pipes): Yes Tobacco use, Select all that apply: 5 or more cigarettes per day Are you interested in Tobacco Cessation Medications?: No, patient refused Counseled patient on smoking cessation including recognizing danger situations, developing coping skills and basic information about quitting provided: Refused/Declined practical counseling Social History:  Social History   Substance and Sexual Activity  Alcohol Use Yes   Comment: weekly     Social History   Substance and Sexual Activity  Drug Use Never    Additional Social History: Marital status: Single What is your sexual orientation?: homosexual Does patient have children?: No  Allergies:  No Known Allergies Lab Results: No results found for this or any previous visit (from the past 48 hour(s)).  Blood Alcohol level:  Lab Results  Component Value Date   ETH <10 78/24/2353    Metabolic Disorder Labs:  No results found for: HGBA1C, MPG No results found for: PROLACTIN No results found for: CHOL, TRIG, HDL, CHOLHDL, VLDL, LDLCALC  Current Medications: Current Facility-Administered Medications  Medication Dose Route Frequency Provider Last Rate Last Admin  . acetaminophen (TYLENOL) tablet 650 mg  650 mg Oral Q6H PRN Caroline Sauger, NP   650 mg at 03/30/20 0918  . alum & mag hydroxide-simeth (MAALOX/MYLANTA) 200-200-20 MG/5ML suspension 30 mL  30 mL Oral Q4H PRN Caroline Sauger, NP      . docusate sodium (COLACE) capsule 100 mg  100 mg Oral BID Ouida Abeyta T, MD   100 mg  at 03/30/20 1211  . haloperidol (HALDOL) tablet 5 mg  5 mg Oral Q6H PRN Celene Pippins, Madie Reno, MD       Or  . haloperidol lactate (HALDOL) injection 5 mg  5 mg Intramuscular Q6H PRN Vernie Vinciguerra T, MD      . hydrOXYzine (ATARAX/VISTARIL) tablet 25 mg  25 mg Oral Q6H PRN Caroline Sauger, NP   25 mg at 03/29/20 2119  . magnesium hydroxide (MILK OF MAGNESIA) suspension 30 mL  30 mL Oral Daily PRN Caroline Sauger, NP      . OLANZapine zydis (ZYPREXA) disintegrating tablet 10 mg  10 mg Oral BID Lavetta Geier T, MD      . traZODone (DESYREL) tablet 50 mg  50 mg Oral QHS PRN Caroline Sauger, NP   50 mg at 03/29/20 2119   PTA Medications: Medications Prior to Admission  Medication Sig Dispense Refill Last Dose  . cephALEXin (KEFLEX) 500 MG capsule Take 1 capsule (500 mg total) by mouth every 6 (six) hours. 10 capsule 0   . elvitegravir-cobicistat-emtricitabine-tenofovir (GENVOYA) 150-150-200-10 MG TABS tablet Take 1 tablet by mouth daily with breakfast. (Patient not taking: Reported on 03/25/2020) 30 tablet 0   . gabapentin (NEURONTIN) 100 MG capsule Take 1 capsule (100 mg total) by mouth 3 (three) times daily for 7 days. 21 capsule 0   . OLANZapine zydis (ZYPREXA) 10 MG disintegrating tablet Take 1 tablet (10 mg total) by mouth at bedtime. 30 tablet 0     Musculoskeletal: Strength & Muscle Tone: within normal limits Gait & Station: normal Patient leans: N/A  Psychiatric Specialty Exam: Physical Exam  Nursing note and vitals reviewed. Constitutional: He appears well-developed and well-nourished.  HENT:  Head: Normocephalic and atraumatic.  Eyes: Pupils are equal, round, and reactive to light. Conjunctivae are normal.  Cardiovascular: Regular rhythm and normal heart sounds.  Respiratory: Effort normal. No respiratory distress.  GI: Soft.  Musculoskeletal:        General: Normal range of motion.     Cervical back: Normal range of motion.  Neurological: He is alert.  Skin: Skin is  warm and dry.  Psychiatric: His affect is labile and inappropriate. His speech is rapid and/or pressured and tangential. He is agitated. He is not aggressive. Thought content is paranoid and delusional. Cognition and memory are impaired. He expresses impulsivity and inappropriate judgment. He expresses no homicidal and no suicidal ideation.    Review of Systems  Constitutional: Negative.   HENT: Negative.   Eyes: Negative.   Respiratory: Negative.   Cardiovascular: Negative.   Gastrointestinal: Negative.   Musculoskeletal: Negative.   Skin: Negative.  Neurological: Negative.   Psychiatric/Behavioral: Negative for suicidal ideas. The patient is hyperactive.     Blood pressure 105/87, pulse 77, temperature 98.8 F (37.1 C), temperature source Oral, resp. rate 16, height 5\' 9"  (1.753 m), weight 74.8 kg, SpO2 100 %.Body mass index is 24.35 kg/m.  General Appearance: Casual  Eye Contact:  Good  Speech:  Pressured  Volume:  Increased  Mood:  Euphoric and Irritable  Affect:  Inappropriate and Labile  Thought Process:  Disorganized  Orientation:  Full (Time, Place, and Person)  Thought Content:  Illogical, Delusions, Paranoid Ideation, Rumination and Tangential  Suicidal Thoughts:  No  Homicidal Thoughts:  No  Memory:  Immediate;   Fair Recent;   Poor Remote;   Poor  Judgement:  Impaired  Insight:  Lacking  Psychomotor Activity:  Increased  Concentration:  Concentration: Poor  Recall:  Poor  Fund of Knowledge:  Poor  Language:  Poor  Akathisia:  No  Handed:  Right  AIMS (if indicated):     Assets:  Housing Resilience Social Support  ADL's:  Impaired  Cognition:  Impaired,  Mild  Sleep:  Number of Hours: 8.25    Treatment Plan Summary: Daily contact with patient to assess and evaluate symptoms and progress in treatment, Medication management and Plan 34 year old man without any known past psychiatric history presents with multiple behaviors and symptoms most consistent with  a manic episode.  He was behaving in a dangerous and threatening manner towards his mother and towards himself.  He continues to behave inappropriately and show symptoms of mania in the hospital.  I agree that he meets commitment criteria and requires inpatient hospitalization.  I tried to discuss this with him but he overrode me with his pressured speech and his statement that he would refuse medicine.  I will go ahead and increase the dissolvable Zyprexa to 10 mg twice a day and have a as needed of Haldol for now.  I am very concerned about the HIV test which was positive in 2019.  I have no idea whether he has ever gotten any treatment for this or not.  The RPR test looks like it may be a false positive but I am going to repeat both of them if possible now.  This raises the possibility of psychosis related to HIV disease in the differential diagnosis.  Observation Level/Precautions:  15 minute checks  Laboratory:  HIV test as noted above  Psychotherapy:    Medications:    Consultations:    Discharge Concerns:    Estimated LOS:  Other:     Physician Treatment Plan for Primary Diagnosis: Severe manic bipolar 1 disorder with psychotic behavior ([name]) Long Term Goal(s): Improvement in symptoms so as ready for discharge  Short Term Goals: Ability to demonstrate self-control will improve and Ability to identify and develop effective coping behaviors will improve  Physician Treatment Plan for Secondary Diagnosis: Principal Problem:   Severe manic bipolar 1 disorder with psychotic behavior ([name]) Active Problems:   Anxiety   Psychosis ([name])   HIV positive ([name])  Long Term Goal(s): Improvement in symptoms so as ready for discharge  Short Term Goals: Ability to maintain clinical measurements within normal limits will improve and Compliance with prescribed medications will improve  I certify that inpatient services furnished can reasonably be expected to improve the patient's condition.    2020, MD 3/31/20211:04 PM

## 2020-03-30 NOTE — Progress Notes (Signed)
Recreation Therapy Notes  Date: 03/30/2020  Time: 9:30 am  Location: Craft Room  Behavioral response: Appropriate   Intervention Topic: Problem-Solving    Discussion/Intervention:  Group content on today was focused on problem solving. The group described what problem solving is. Patients expressed how problems affect them and how they deal with problems. Individuals identified healthy ways to deal with problems. Patients explained what normally happens to them when they do not deal with problems. The group expressed reoccurring problems for them. The group participated in the intervention "Ways to Solve problems" where patients were given a chance to explore different ways to solve problems.  Clinical Observations/Feedback:  Patient came to group late due to unknown reasons. Individual was social with peers and staff while participant in the intervention during group.  Hakiem Malizia LRT/CTRS         Nirvi Boehler 03/30/2020 12:20 PM

## 2020-03-30 NOTE — Tx Team (Signed)
Initial Treatment Plan 03/30/2020 12:06 AM [name] HIP:[id number]    PATIENT STRESSORS: Marital or family conflict Medication change or noncompliance   PATIENT STRENGTHS: Ability for insight Motivation for treatment/growth   PATIENT IDENTIFIED PROBLEMS: Suicidal Ideation     Bipolar disorder                  DISCHARGE CRITERIA:  Improved stabilization in mood, thinking, and/or behavior Motivation to continue treatment in a less acute level of care  PRELIMINARY DISCHARGE PLAN: Outpatient therapy  PATIENT/FAMILY INVOLVEMENT: This treatment plan has been presented to and reviewed with the patient, Mark Galloway, The patient and family have been given the opportunity to ask questions and make suggestions.  Trula Ore, RN 03/30/2020, 12:06 AM

## 2020-03-30 NOTE — [facility] Group Notes (Signed)
Balance In Life 03/30/2020 1PM  Type of Therapy/Topic:  Group Therapy:  Balance in Life  Participation Level:  Minimal  Description of Group:   This group will address the concept of balance and how it feels and looks when one is unbalanced. Patients will be encouraged to process areas in their lives that are out of balance and identify reasons for remaining unbalanced. Facilitators will guide patients in utilizing problem-solving interventions to address and correct the stressor making their life unbalanced. Understanding and applying boundaries will be explored and addressed for obtaining and maintaining a balanced life. Patients will be encouraged to explore ways to assertively make their unbalanced needs known to significant others in their lives, using other group members and facilitator for support and feedback.  Therapeutic Goals: 1. Patient will identify two or more emotions or situations they have that consume much of in their lives. 2. Patient will identify signs/triggers that life has become out of balance:  3. Patient will identify two ways to set boundaries in order to achieve balance in their lives:  4. Patient will demonstrate ability to communicate their needs through discussion and/or role plays  Summary of Patient Progress: Pt came to group for a very brief period of time and was removed from group by nurse. Pt did not return.   Therapeutic Modalities:   Cognitive Behavioral Therapy Solution-Focused Therapy Assertiveness Training  Alyssabeth Bruster Philip Aspen, LCSW

## 2020-03-30 NOTE — [facility] Suicide Risk Assessment (Signed)
[facility] INPATIENT:  Family/Significant Other Suicide Prevention Education  Suicide Prevention Education:  Patient Refusal for Family/Significant Other Suicide Prevention Education: The patient Mark Galloway has refused to provide written consent for family/significant other to be provided Family/Significant Other Suicide Prevention Education during admission and/or prior to discharge.  Physician notified.   SPE completed with pt, as pt refused to consent to family contact. SPI pamphlet provided to pt and pt was encouraged to share information with support network, ask questions, and talk about any concerns relating to SPE. Pt denies access to guns/firearms and verbalized understanding of information provided. Mobile Crisis information also provided to pt.    Charlann Lange Clary Meeker MSW LCSW 03/30/2020, 11:32 AM

## 2020-03-30 NOTE — [facility] Suicide Risk Assessment (Signed)
[facility] Admission Suicide Risk Assessment   Nursing information obtained from:  Patient Demographic factors:  Male Current Mental Status:  NA Loss Factors:  NA Historical Factors:  NA Risk Reduction Factors:  NA  Total Time spent with patient: 1 hour Principal Problem: Severe manic bipolar 1 disorder with psychotic behavior ([name]) Diagnosis:  Principal Problem:   Severe manic bipolar 1 disorder with psychotic behavior ([name]) Active Problems:   Anxiety   Psychosis ([name])   HIV positive ([name])  Subjective Data: 34 year old man brought in under IVC with reports of agitated possibly threatening bizarre behavior.  No reports of suicidal threats or behavior.  Patient comes across as most likely having a manic episode.  No evidence of self-harm.  Continued Clinical Symptoms:  Alcohol Use Disorder Identification Test Final Score (AUDIT): 0 The "Alcohol Use Disorders Identification Test", Guidelines for Use in Primary Care, Second Edition.  World Science writer [facility] Score between 0-7:  no or low risk or alcohol related problems. Score between 8-15:  moderate risk of alcohol related problems. Score between 16-19:  high risk of alcohol related problems. Score 20 or above:  warrants further diagnostic evaluation for alcohol dependence and treatment.   CLINICAL FACTORS:   Bipolar Disorder:   Mixed State   Musculoskeletal: Strength & Muscle Tone: within normal limits Gait & Station: normal Patient leans: N/A  Psychiatric Specialty Exam: Physical Exam  Nursing note and vitals reviewed. Constitutional: He appears well-developed and well-nourished.  HENT:  Head: Normocephalic and atraumatic.  Eyes: Pupils are equal, round, and reactive to light. Conjunctivae are normal.  Cardiovascular: Regular rhythm and normal heart sounds.  Respiratory: Effort normal. No respiratory distress.  GI: Soft.  Musculoskeletal:        General: Normal range of motion.     Cervical back: Normal range of  motion.  Neurological: He is alert.  Skin: Skin is warm and dry.  Psychiatric: His affect is labile and inappropriate. His speech is rapid and/or pressured. He is agitated. He is not aggressive. Thought content is paranoid and delusional. Cognition and memory are impaired. He expresses impulsivity and inappropriate judgment. He expresses no homicidal and no suicidal ideation.    Review of Systems  Constitutional: Negative.   HENT: Negative.   Eyes: Negative.   Respiratory: Negative.   Cardiovascular: Negative.   Gastrointestinal: Negative.   Musculoskeletal: Negative.   Skin: Negative.   Neurological: Negative.   Psychiatric/Behavioral: Positive for behavioral problems. The patient is hyperactive.     Blood pressure 105/87, pulse 77, temperature 98.8 F (37.1 C), temperature source Oral, resp. rate 16, height 5\' 9"  (1.753 m), weight 74.8 kg, SpO2 100 %.Body mass index is 24.35 kg/m.  General Appearance: Casual  Eye Contact:  Good  Speech:  Pressured  Volume:  Increased  Mood:  Irritable  Affect:  Inappropriate and Labile  Thought Process:  Disorganized  Orientation:  Full (Time, Place, and Person)  Thought Content:  Illogical and Tangential  Suicidal Thoughts:  No  Homicidal Thoughts:  No  Memory:  Immediate;   Fair Recent;   Poor Remote;   Poor  Judgement:  Impaired  Insight:  Lacking  Psychomotor Activity:  Increased  Concentration:  Concentration: Poor  Recall:  Poor  Fund of Knowledge:  Poor  Language:  Fair  Akathisia:  No  Handed:  Right  AIMS (if indicated):     Assets:  Housing Social Support  ADL's:  Impaired  Cognition:  Impaired,  Mild  Sleep:  Number of Hours:  8.25      COGNITIVE FEATURES THAT CONTRIBUTE TO RISK:  Loss of executive function    SUICIDE RISK:   Minimal: No identifiable suicidal ideation.  Patients presenting with no risk factors but with morbid ruminations; may be classified as minimal risk based on the severity of the depressive  symptoms  PLAN OF CARE: Continue 15-minute checks.  Initiate medication treatment for mania.  Try and get collateral information as much as possible.  Include in individual and group work-up and evaluation.  Reassess dangerousness prior to discharge  I certify that inpatient services furnished can reasonably be expected to improve the patient's condition.   Alethia Berthold, MD 03/30/2020, 12:59 PM

## 2020-03-30 NOTE — [facility] Group Notes (Signed)
[facility] Group Notes:  (Nursing/MHT/Case Management/Adjunct)  Date:  03/30/2020  Time:  8:43 PM  Type of Therapy:  Wrap-Up Group  Participation Level:  Did Not Attend  Participation Quality:    Affect:    Cognitive:    Insight:    Engagement in Group:    Modes of Intervention:    Summary of Progress/Problems:  Mark Galloway 03/30/2020, 8:43 PM

## 2020-03-30 NOTE — Progress Notes (Signed)
Pt denies depression, anxiety, SI, HI and AVH. Pt was educated on care plan and verbalizes understanding. Pt has attended groups today. Pt has been bizarre, animated, pressured speech, took four or five showers, has irritated other patients, abused his call bell in his room and demanding. He has not been verbally or physically abusive. A sitter was ordered and he is behind the doors.  Torrie Mayers RN

## 2020-03-30 NOTE — Progress Notes (Signed)
Admission Note:  34 yr male who presents IVC for treatment of SI and bipolar disorder. Patient upon arrival he was noted restless, hyper verbal, speech is pressured, he appears manic.Patient's  thoughts are disorganized, speech is tangential, mood is congruent with affect, he was  cooperative with admission process. Patient currently denies SI/HI/AVH and contracts for safety upon admission. Patient stated ' I am having issues with my mother l bought her 2 cars and a new house she is still not satisfiedPatent attorney cannot clarify if its through or not.   Patient has Past medical Hx of Anxiety, Psychosis, MDD, and Bipolar diosrder. Skin was assessed it was warm and dry, he had older scars on his upper chest he stated " it was from shingles last year he was searched in presence of Molli Hazard RN  no contraband found, POC and unit policies explained and understanding verbalized. Consents obtained. Food and fluids offered, and he accepted it. 15 minutes safety checks maintained will continue to monitor.

## 2020-03-30 NOTE — [facility] Group Notes (Signed)
[facility] Group Notes:  (Nursing/MHT/Case Management/Adjunct)  Date:  03/30/2020  Time:  3:48 AM  Type of Therapy:  Group Therapy  Participation Level:  Active  Participation Quality:  Appropriate, Sharing and Supportive  Affect:  Appropriate  Cognitive:  Appropriate and Disorganized  Insight:  Good  Engagement in Group:  Engaged and Supportive  Modes of Intervention:  Discussion, Education and Support  Summary of Progress/Problems: PT was very supportive and encouraging towards the other PT's. He stated that he wants to be better organized and at discharge, he wants to stay organized...world peace  Mark Galloway 03/30/2020, 3:48 AM

## 2020-03-31 LAB — RPR
RPR Ser Ql: REACTIVE — AB
RPR Titer: 1:4 {titer}

## 2020-03-31 LAB — HIV ANTIBODY (ROUTINE TESTING W REFLEX): HIV Screen 4th Generation wRfx: REACTIVE — AB

## 2020-03-31 NOTE — Progress Notes (Signed)
Pt has been calm and cooperative this afternoon. He has mainly been in his room sleeping. He says the medication makes him sleepy. Torrie Mayers RN

## 2020-03-31 NOTE — Progress Notes (Signed)
Recreation Therapy Notes  INPATIENT RECREATION THERAPY ASSESSMENT  Patient Details Name: Mark Galloway MRN: [id number] DOB: 10-15-1986 Today's Date: 03/31/2020       Information Obtained From: Patient  Able to Participate in Assessment/Interview: Yes  Patient Presentation: Responsive  Reason for Admission (Per Patient): Active Symptoms  Patient Stressors:    Coping Skills:   Deep Breathing, Hot Bath/Shower  Leisure Interests (2+):  Social - Friends, Sports - Dance  Frequency of Recreation/Participation: Marketing executive Resources:     [employer]:     Current Use:    If no, Barriers?:    Expressed Interest in [employer] Information:    [employer] of Residence:  [employer]  Patient Main Form of Transportation: Walk  Patient Strengths:  Ability to adapt  Patient Identified Areas of Improvement:  Putting myself in others shoes  Patient Goal for Hospitalization:  To become more organized and finish my poem  Current SI (including self-harm):  No  Current HI:  No  Current AVH: No  Staff Intervention Plan: Group Attendance, Collaborate with Interdisciplinary Treatment Team  Consent to Intern Participation: N/A  Azalyn Sliwa 03/31/2020, 3:52 PM

## 2020-03-31 NOTE — [facility] Group Notes (Signed)
[facility] Group Notes:  (Nursing/MHT/Case Management/Adjunct)  Date:  03/31/2020  Time:  10:02 PM  Type of Therapy:  Group Therapy  Participation Level:  Active  Participation Quality:  Appropriate, Sharing and Supportive  Affect:  Appropriate  Cognitive:  Appropriate  Insight:  Appropriate  Engagement in Group:  Engaged  Modes of Intervention:  Discussion, Education and Support  Summary of Progress/Problems: PT has in house goals which to be more organized and have better thoughts. Upon discharge he wants to continue being more organized Landry Mellow 03/31/2020, 10:02 PM

## 2020-03-31 NOTE — Progress Notes (Signed)
Pt has remained calm. cooperative and appropriate with sitter. He has not been intrusive today. Will continue to monitor and assess. Torrie Mayers RN

## 2020-03-31 NOTE — Tx Team (Addendum)
Interdisciplinary Treatment and Diagnostic Plan Update  03/31/2020 Time of Session: 9:00AM Less Woolsey MRN: [id number]  Principal Diagnosis: Severe manic bipolar 1 disorder with psychotic behavior ([place])  Secondary Diagnoses: Principal Problem:   Severe manic bipolar 1 disorder with psychotic behavior ([place]) Active Problems:   Anxiety   Psychosis ([place])   HIV positive ([place])   Current Medications:  Current Facility-Administered Medications  Medication Dose Route Frequency Provider Last Rate Last Admin  . acetaminophen (TYLENOL) tablet 650 mg  650 mg Oral Q6H PRN Caroline Sauger, NP   650 mg at 03/31/20 0306  . alum & mag hydroxide-simeth (MAALOX/MYLANTA) 200-200-20 MG/5ML suspension 30 mL  30 mL Oral Q4H PRN Caroline Sauger, NP      . docusate sodium (COLACE) capsule 100 mg  100 mg Oral BID [name], [name], MD   100 mg at 03/31/20 0758  . haloperidol (HALDOL) tablet 5 mg  5 mg Oral Q6H PRN [name], Madie Reno, MD       Or  . haloperidol lactate (HALDOL) injection 5 mg  5 mg Intramuscular Q6H PRN [name], [name], MD      . hydrOXYzine (ATARAX/VISTARIL) tablet 25 mg  25 mg Oral Q6H PRN Caroline Sauger, NP   25 mg at 03/31/20 0306  . magnesium hydroxide (MILK OF MAGNESIA) suspension 30 mL  30 mL Oral Daily PRN Caroline Sauger, NP      . OLANZapine zydis (ZYPREXA) disintegrating tablet 10 mg  10 mg Oral BID [name], Madie Reno, MD   10 mg at 03/31/20 0759  . traZODone (DESYREL) tablet 50 mg  50 mg Oral QHS PRN Caroline Sauger, NP   50 mg at 03/30/20 2118   PTA Medications: Medications Prior to Admission  Medication Sig Dispense Refill Last Dose  . cephALEXin (KEFLEX) 500 MG capsule Take 1 capsule (500 mg total) by mouth every 6 (six) hours. 10 capsule 0   . elvitegravir-cobicistat-emtricitabine-tenofovir (GENVOYA) 150-150-200-10 MG TABS tablet Take 1 tablet by mouth daily with breakfast. (Patient not taking: Reported on 03/25/2020) 30 tablet 0   . gabapentin (NEURONTIN) 100  MG capsule Take 1 capsule (100 mg total) by mouth 3 (three) times daily for 7 days. 21 capsule 0   . OLANZapine zydis (ZYPREXA) 10 MG disintegrating tablet Take 1 tablet (10 mg total) by mouth at bedtime. 30 tablet 0     Patient Stressors: Marital or family conflict Medication change or noncompliance  Patient Strengths: Ability for insight Motivation for treatment/growth  Treatment Modalities: Medication Management, Group therapy, Case management,  1 to 1 session with clinician, Psychoeducation, Recreational therapy.   Physician Treatment Plan for Primary Diagnosis: Severe manic bipolar 1 disorder with psychotic behavior ([place]) Long Term Goal(s): Improvement in symptoms so as ready for discharge Improvement in symptoms so as ready for discharge   Short Term Goals: Ability to demonstrate self-control will improve Ability to identify and develop effective coping behaviors will improve Ability to maintain clinical measurements within normal limits will improve Compliance with prescribed medications will improve  Medication Management: Evaluate patient's response, side effects, and tolerance of medication regimen.  Therapeutic Interventions: 1 to 1 sessions, Unit Group sessions and Medication administration.  Evaluation of Outcomes: Not Met  Physician Treatment Plan for Secondary Diagnosis: Principal Problem:   Severe manic bipolar 1 disorder with psychotic behavior ([place]) Active Problems:   Anxiety   Psychosis ([place])   HIV positive ([place])  Long Term Goal(s): Improvement in symptoms so as ready for discharge Improvement in symptoms so as ready  for discharge   Short Term Goals: Ability to demonstrate self-control will improve Ability to identify and develop effective coping behaviors will improve Ability to maintain clinical measurements within normal limits will improve Compliance with prescribed medications will improve     Medication Management: Evaluate patient's response, side  effects, and tolerance of medication regimen.  Therapeutic Interventions: 1 to 1 sessions, Unit Group sessions and Medication administration.  Evaluation of Outcomes: Not Met   RN Treatment Plan for Primary Diagnosis: Severe manic bipolar 1 disorder with psychotic behavior ([place]) Long Term Goal(s): Knowledge of disease and therapeutic regimen to maintain health will improve  Short Term Goals: Ability to demonstrate self-control, Ability to participate in decision making will improve, Ability to verbalize feelings will improve, Ability to disclose and discuss suicidal ideas, Ability to identify and develop effective coping behaviors will improve and Compliance with prescribed medications will improve  Medication Management: RN will administer medications as ordered by provider, will assess and evaluate patient's response and provide education to patient for prescribed medication. RN will report any adverse and/or side effects to prescribing provider.  Therapeutic Interventions: 1 on 1 counseling sessions, Psychoeducation, Medication administration, Evaluate responses to treatment, Monitor vital signs and CBGs as ordered, Perform/monitor CIWA, COWS, AIMS and Fall Risk screenings as ordered, Perform wound care treatments as ordered.  Evaluation of Outcomes: Not Met   LCSW Treatment Plan for Primary Diagnosis: Severe manic bipolar 1 disorder with psychotic behavior ([place]) Long Term Goal(s): Safe transition to appropriate next level of care at discharge, Engage patient in therapeutic group addressing interpersonal concerns.  Short Term Goals: Engage patient in aftercare planning with referrals and resources, Increase social support, Increase ability to appropriately verbalize feelings, Increase emotional regulation, Facilitate acceptance of mental health diagnosis and concerns and Increase skills for wellness and recovery  Therapeutic Interventions: Assess for all discharge needs, 1 to 1 time with  Social worker, Explore available resources and support systems, Assess for adequacy in community support network, Educate family and significant other(s) on suicide prevention, Complete Psychosocial Assessment, Interpersonal group therapy.  Evaluation of Outcomes: Not Met   Progress in Treatment: Attending groups: Yes. Participating in groups: Yes. Taking medication as prescribed: Yes. Toleration medication: Yes. Family/Significant other contact made: No, will contact:  pt declined. Patient understands diagnosis: Yes. Discussing patient identified problems/goals with staff: Yes. Medical problems stabilized or resolved: Yes. Denies suicidal/homicidal ideation: Yes. Issues/concerns per patient self-inventory: No. Other: none  New problem(s) identified: No, Describe:  none  New Short Term/Long Term Goal(s): elimination of symptoms of psychosis, medication management for mood stabilization; elimination of SI thoughts; development of comprehensive mental wellness plan.  Patient Goals:  "working on my organization"  Discharge Plan or Barriers:  Patient has declined aftercare at this time.  CSW will continue to assist the patient with aftercare plans should he change his mind.   Reason for Continuation of Hospitalization: Aggression Anxiety Depression Medication stabilization  Estimated Length of Stay: 1-7 days  Recreational Therapy: Patient Stressors: N/A Patient Goal: Patient will engage in groups without prompting or encouragement from LRT x3 group sessions within 5 recreation therapy group sessions  Attendees: Patient:Mark Galloway 03/31/2020 10:03 AM  Physician: Dr. Weber Cooks, MD 03/31/2020 10:03 AM  Nursing: Collier Bullock, RN 03/31/2020 10:03 AM  RN Care Manager: 03/31/2020 10:03 AM  Social Worker: Assunta Curtis, LCSW 03/31/2020 10:03 AM  Recreational Therapist: Roanna Epley, CTRS, LRT 03/31/2020 10:03 AM  Other: Evalina Field, LCSW 03/31/2020 10:03 AM  Other:  03/31/2020 10:03 AM  Other: 03/31/2020 10:03 AM    Scribe for Treatment Team: Rozann Lesches, LCSW 03/31/2020 10:03 AM

## 2020-03-31 NOTE — Progress Notes (Signed)
[facility] MD Progress Note  03/31/2020 1:38 PM Mark Galloway  MRN:  [id number]   Subjective: Follow-up for this 34 year old male diagnosed with severe bipolar 1 disorder manic episode with psychotic behavior.  Patient reports that he is doing good today.  He states that the medication does seem to be helping him some.  He states that he feels like his mind has slowed down somewhat he feels a lot calmer today.  Patient reports that he did go to some of the groups today but stated that he was feeling a little bit sleepy and thinks that it may be due to the medication so he came back to his room to lay down.  Patient denies any suicidal or homicidal ideations and denies any hallucinations.  Patient states that he will continue his medications and will continue to try to go to more groups.  Principal Problem: Severe manic bipolar 1 disorder with psychotic behavior ([name]) Diagnosis: Principal Problem:   Severe manic bipolar 1 disorder with psychotic behavior ([name]) Active Problems:   Anxiety   Psychosis ([name])   HIV positive ([name])  Total Time spent with patient: 30 minutes  Past Psychiatric History: Little information available.  The patient claims he has never seen any kind of mental health provider in the past and claims to have never been prescribed any psychiatric medicine.  Notes report that he had said in [place] that he had a therapist named "Amber Rose".  When I asked him about that today he came up with some odd tangential excuse for it.  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  I do not know if that is true or what other follow-up there is to that.  Past Medical History:  Past Medical History:  Diagnosis Date  . Shingles    History reviewed. No pertinent surgical history. Family History: History reviewed. No pertinent family history. Family Psychiatric  History:  The patient tells me he thinks that people in his family have "schizo bipolar disorder".  I asked him how he knew that he said that they just act funny. Social History:  Social History   Substance and Sexual Activity  Alcohol Use Yes   Comment: weekly     Social History   Substance and Sexual Activity  Drug Use Never    Social History   Socioeconomic History  . Marital status: Single    Spouse name: Not on file  . Number of children: Not on file  . Years of education: Not on file  . Highest education level: Not on file  Occupational History  . Not on file  Tobacco Use  . Smoking status: Current Some Day Smoker  . Smokeless tobacco: Never Used  Substance and Sexual Activity  . Alcohol use: Yes    Comment: weekly  . Drug use: Never  . Sexual activity: Not Currently  Other Topics Concern  . Not on file  Social History Narrative  . Not on file   Social Determinants of Health   Financial Resource Strain:   . Difficulty of Paying Living Expenses:   Food Insecurity:   . Worried About Programme researcher, broadcasting/film/video in the Last Year:   . Barista in the Last Year:   Transportation Needs:   . Freight forwarder (Medical):   Marland Kitchen Lack of Transportation (Non-Medical):  Physical Activity:   . Days of Exercise per Week:   . Minutes of Exercise per Session:   Stress:   . Feeling of Stress :   Social Connections:   . Frequency of Communication with Friends and Family:   . Frequency of Social Gatherings with Friends and Family:   . Attends Religious Services:   . Active Member of Clubs or Organizations:   . Attends Banker Meetings:   Marland Kitchen Marital Status:    Additional Social History:                         Sleep: Good  Appetite:  Good  Current Medications: Current Facility-Administered Medications  Medication Dose Route Frequency Provider Last Rate Last Admin  . acetaminophen (TYLENOL) tablet 650 mg  650 mg Oral Q6H PRN Gillermo Murdoch, NP    650 mg at 03/31/20 1158  . alum & mag hydroxide-simeth (MAALOX/MYLANTA) 200-200-20 MG/5ML suspension 30 mL  30 mL Oral Q4H PRN Gillermo Murdoch, NP      . docusate sodium (COLACE) capsule 100 mg  100 mg Oral BID [name], [name], MD   100 mg at 03/31/20 0758  . haloperidol (HALDOL) tablet 5 mg  5 mg Oral Q6H PRN [name], Jackquline Denmark, MD       Or  . haloperidol lactate (HALDOL) injection 5 mg  5 mg Intramuscular Q6H PRN [name], [name], MD      . hydrOXYzine (ATARAX/VISTARIL) tablet 25 mg  25 mg Oral Q6H PRN Gillermo Murdoch, NP   25 mg at 03/31/20 0306  . magnesium hydroxide (MILK OF MAGNESIA) suspension 30 mL  30 mL Oral Daily PRN Gillermo Murdoch, NP      . OLANZapine zydis (ZYPREXA) disintegrating tablet 10 mg  10 mg Oral BID [name], Jackquline Denmark, MD   10 mg at 03/31/20 0759  . traZODone (DESYREL) tablet 50 mg  50 mg Oral QHS PRN Gillermo Murdoch, NP   50 mg at 03/30/20 2118    Lab Results:  Results for orders placed or performed during the hospital encounter of 03/29/20 (from the past 48 hour(s))  RPR     Status: Abnormal   Collection Time: 03/30/20  1:27 PM  Result Value Ref Range   RPR Ser Ql Reactive (A) NON REACTIVE    Comment: SENT FOR CONFIRMATION   RPR Titer 1:4     Comment: Performed at [facility], [address], [place], [place] [address]  HIV Antibody (routine testing w rflx)     Status: Abnormal   Collection Time: 03/30/20  1:27 PM  Result Value Ref Range   HIV Screen 4th Generation wRfx Reactive (A) NON REACTIVE    Comment: REPEATED TO VERIFY (NOTE) Reactive result does not distinguish HIV-1 p24 antigen, HIV-1  antibody, HIV-2 antibody, and HIV-1 group O antibody. Results  reactive by HIV Antigen/Antibody EIA must be confirmed. Sent for  confirmation. Performed at [facility], [address], [place], [place] [address]     Blood Alcohol level:  Lab Results  Component Value Date   ETH <10 03/25/2020    Metabolic Disorder Labs: No results  found for: HGBA1C, MPG No results found for: PROLACTIN No results found for: CHOL, TRIG, HDL, CHOLHDL, VLDL, LDLCALC  Physical Findings: AIMS:  , ,  ,  ,    CIWA:    COWS:     Musculoskeletal: Strength & Muscle Tone: within normal limits Gait & Station:  normal Patient leans: N/A  Psychiatric Specialty Exam: Physical Exam  Nursing note and vitals reviewed. Constitutional: He is oriented to person, place, and time. He appears well-developed and well-nourished.  Cardiovascular: Normal rate.  Respiratory: Effort normal.  Musculoskeletal:        General: Normal range of motion.  Neurological: He is alert and oriented to person, place, and time.  Skin: Skin is warm.    Review of Systems  Constitutional: Negative.   HENT: Negative.   Eyes: Negative.   Respiratory: Negative.   Cardiovascular: Negative.   Gastrointestinal: Negative.   Genitourinary: Negative.   Musculoskeletal: Negative.   Skin: Negative.   Neurological: Negative.   Psychiatric/Behavioral: The patient is hyperactive.     Blood pressure 116/71, pulse 75, temperature 97.7 F (36.5 C), temperature source Oral, resp. rate 16, height 5\' 9"  (1.753 m), weight 74.8 kg, SpO2 98 %.Body mass index is 24.35 kg/m.  General Appearance: Casual  Eye Contact:  Fair  Speech:  Clear and Coherent and Pressured  Volume:  Increased  Mood:  Euphoric  Affect:  Congruent  Thought Process:  Goal Directed, Linear and Descriptions of Associations: Tangential  Orientation:  Full (Time, Place, and Person)  Thought Content:  Ideas of Reference:   Paranoia Delusions, Rumination and Tangential  Suicidal Thoughts:  No  Homicidal Thoughts:  No  Memory:  Immediate;   Fair Recent;   Fair Remote;   Fair  Judgement:  Fair  Insight:  Fair  Psychomotor Activity:  Normal  Concentration:  Concentration: Fair  Recall:  [employer] of Knowledge:  Fair  Language:  Fair  Akathisia:  No  Handed:  Right  AIMS (if indicated):     Assets:   Desire for Improvement Financial Resources/Insurance Housing Social Support Transportation  ADL's:  Intact  Cognition:  WNL  Sleep:  Number of Hours: 7.5   Assessment: Patient presents in his room lying in the bed and has been attending group.  Patient is much less intrusive today and has been more cooperative and compliant.  Patient has been taking his medications.  Patient has not been as loud as he was yesterday and his speech has slowed some.  Patient is showing some improvement with taking his medications last night and this morning.  Patient's speech continues to be tangential and he does have ideas of reference but still showing an improvement over yesterday.  He has continued to deny any suicidal homicidal ideations and denies any hallucinations.  No medication changes today.  Reviewed patient's labs and his HIV came back reactive, RPR came back reactive with titer at 1:4.  Consulted with Dr. Weber Cooks about this and a consultation to infectious disease has been placed.  Patient has continued to be on 1:1 today due to his extreme intrusiveness and inappropriate comments yesterday.  However with the patient's continued compliance and cooperation feel that the 1:1 will be dropped potentially by the end of the shift.  Treatment Plan Summary: Daily contact with patient to assess and evaluate symptoms and progress in treatment and Medication management Continue Zyprexa Zydis 10 mg p.o. twice daily for bipolar 1 disorder Continue Colace 100 mg p.o. twice daily for constipation Continue Haldol p.o. or IM 5 mg every 6 hours as needed for agitation Continue Vistaril 25 mg p.o. every 6 hours as needed for anxiety Continue trazodone 50 mg p.o. nightly as needed for insomnia Encourage group therapy participation Continue every 15 minute safety checks  Lewis Shock, [name] 03/31/2020, 1:38 PM

## 2020-03-31 NOTE — [facility] Group Notes (Signed)
  LCSW Group Therapy Note  03/31/2020 2:31 PM   Type of Therapy/Topic:  Group Therapy:  Feelings about Diagnosis  Participation Level:  Active   Description of Group:   This group will allow patients to explore their thoughts and feelings about diagnoses they have received. Patients will be guided to explore their level of understanding and acceptance of these diagnoses. Facilitator will encourage patients to process their thoughts and feelings about the reactions of others to their diagnosis and will guide patients in identifying ways to discuss their diagnosis with significant others in their lives. This group will be process-oriented, with patients participating in exploration of their own experiences, giving and receiving support, and processing challenge from other group members.   Therapeutic Goals: 1. Patient will demonstrate understanding of diagnosis as evidenced by identifying two or more symptoms of the disorder 2. Patient will be able to express two feelings regarding the diagnosis 3. Patient will demonstrate their ability to communicate their needs through discussion and/or role play  Summary of Patient Progress: Pt was appropriate and respectful in group. Pt was able to identify a few different diagnosis that were discussed. Pt reported that his friend has seasonal affective disorder. Pt reported that he has anxiety. Pt asked questions regarding Dissociative Identify Disorder and nicknames and alter egos and inquired if they were the same thing.   Therapeutic Modalities:   Cognitive Behavioral Therapy Brief Therapy Feelings Identification    Iris Pert, MSW, LCSW Clinical Social Work 03/31/2020 2:31 PM

## 2020-03-31 NOTE — Progress Notes (Signed)
Recreation Therapy Notes  Date: 03/31/2020  Time: 9:30 am  Location: Room 21   Behavioral response: Appropriate   Intervention Topic: Animal Assisted Therapy   Discussion/Intervention:  Animal Assisted Therapy took place today during group.  Animal Assisted Therapy is the planned inclusion of an animal in a patient's treatment plan. The patients were able to engage in therapy with an animal during group. Participants were educated on what a service dog is and the different between a support dog and a service dog. Patient were informed on the many animal needs there are and how their needs are similar. Individuals were enlightened on the process to get a service animal or support animal. Patients got the opportunity to pet the animal and were offered emotional support from the animal and staff.  Clinical Observations/Feedback:  Patient came to group and was on topic and was focused on what peers and staff had to say. Participant shared their experiences and history with animals. Individual was social with peers, staff and animal while participating in group.  Mark Galloway LRT/CTRS         Mark Galloway 03/31/2020 11:23 AM

## 2020-03-31 NOTE — Progress Notes (Signed)
Pt has been calm and cooperative today. I:I sitter is going to be d/c. Will continue to monitor.  Torrie Mayers RN

## 2020-03-31 NOTE — Plan of Care (Signed)
Pt denies depression, anxiety, SI, Hi and AVH. Pt was educated on care plan and verbalizes understanding. Pt was encouraged to attend groups and has. Pt has been med compliant, calm and cooperative with sitter. Torrie Mayers RN Problem: Education: Goal: Knowledge of [facility] Education information/materials will improve Outcome: Progressing Goal: Emotional status will improve Outcome: Progressing Goal: Mental status will improve Outcome: Progressing Goal: Verbalization of understanding the information provided will improve Outcome: Progressing   Problem: Safety: Goal: Periods of time without injury will increase Outcome: Progressing   Problem: Activity: Goal: Will verbalize the importance of balancing activity with adequate rest periods Outcome: Progressing   Problem: Safety: Goal: Ability to redirect hostility and anger into socially appropriate behaviors will improve Outcome: Progressing Goal: Ability to remain free from injury will improve Outcome: Progressing

## 2020-03-31 NOTE — Plan of Care (Signed)
  Problem: Safety: Goal: Periods of time without injury will increase Outcome: Progressing  D: Patient is very hyperactive at beginning of shift. Grandiose. 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 Asking to have his mail sent to his apartment in [place] because he is not getting along with his mom. Listing names of movies he has been in. Denies SI, HI and AVH. Has showered 4-5 times this shift. A: Continue to monitor for safety R: Safety maintained.

## 2020-03-31 NOTE — Progress Notes (Signed)
1900-2300 D: Patient is very hyperactive at beginning of shift. Grandiose. 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 Asking to have his mail sent to his apartment in [place] because he is not getting along with his mom. Listing names of movies he has been in. Denies SI, HI and AVH. Has showered 4-5 times this shift. A: Continue to monitor for safety 1:1 R: Safety maintained. 2400-0400 D: Patient resting in bed. Up x1 for Tylenol and Vistaril for aches in arms and legs.  A :Continue to monitor for safety 1:1 R: safety maintained. 0400-0700 D: Patient rested during the night. A: Continue to monitor 1:1 for safety R: safety maintained.

## 2020-04-01 LAB — T.PALLIDUM AB, TOTAL: T Pallidum Abs: REACTIVE — AB

## 2020-04-01 LAB — HIV-1/2 AB - DIFFERENTIATION
HIV 1 Ab: POSITIVE — AB
HIV 2 Ab: NEGATIVE

## 2020-04-01 MED ORDER — OLANZAPINE 5 MG PO TBDP
15.0000 mg | ORAL_TABLET | Freq: Two times a day (BID) | ORAL | Status: DC
  Administered 2020-04-01 – 2020-04-05 (×8): 15 mg via ORAL
  Filled 2020-04-01 (×8): qty 3

## 2020-04-01 NOTE — Plan of Care (Signed)
  Problem: Activity: Goal: Will verbalize the importance of balancing activity with adequate rest periods Outcome: Progressing  Patient acknowledges and stated  " l need sleep to make him feel better "

## 2020-04-01 NOTE — Progress Notes (Addendum)
Client alert and ambulatory. Denies SI/HI/AVH. C/O of pain, rated 7/10 for left knee pain and 9/10 for back pain. Requested Prn for pain around 2000. Unable to give per last PRN per request was too soon. When f/u to access pain was done around 2100, client barely awake, stated "pain much better girl". Mood seems pleasant but anxious, with rapid speech at times. Client compliant with meds, cooperative and interacting with other residents.

## 2020-04-01 NOTE — [facility] Group Notes (Signed)
[facility] Group Notes:  (Nursing/MHT/Case Management/Adjunct)  Date:  04/01/2020  Time:  9:54 AM  Type of Therapy:  Community Meeting  Participation Level:  Did Not Attend   Mark Galloway 04/01/2020, 9:54 AM

## 2020-04-01 NOTE — Consult Note (Signed)
NAME: Mark Galloway  DOB: 02-13-1986  MRN: [id number]  Date/Time: 04/01/2020 11:27 AM  REQUESTING PROVIDER: [name] Subjective:  REASON FOR CONSULT: HIv, RPR positive ? Mark Galloway is a 34 y.o.male  Is admitted to the psych floor with Manic episode. I am asked to see him for HIv and a positive RPR. I went to the patient's room to talk to him- pt was moving around, brushing his teeth with flight of ideas and speech. Pt is an evasive historian. He knows that has HIV. 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 He has a private doctor in [place] who is treating him for HIV. He mentioned that doctor as Dr.Ghavami. He does not know the name of the medication.  Past Medical History:  Diagnosis Date  . Shingles     History reviewed. No pertinent surgical history.  Social History   Socioeconomic History  . Marital status: Single    Spouse name: Not on file  . Number of children: Not on file  . Years of education: Not on file  . Highest education level: Not on file  Occupational History  . Not on file  Tobacco Use  . Smoking status: Current Some Day Smoker  . Smokeless tobacco: Never Used  Substance and Sexual Activity  . Alcohol use: Yes    Comment: weekly  . Drug use: Never  . Sexual activity: Not Currently  Other Topics Concern  . Not on file  Social History Narrative  . Not on file   Family history  not available? Current Facility-Administered Medications  Medication Dose Route Frequency Provider Last Rate Last Admin  . acetaminophen (TYLENOL) tablet 650 mg  650 mg Oral Q6H PRN Gillermo Murdoch, NP   650 mg at 03/31/20 1945  . alum & mag hydroxide-simeth (MAALOX/MYLANTA) 200-200-20 MG/5ML suspension 30 mL  30 mL Oral Q4H PRN Gillermo Murdoch, NP      . docusate sodium (COLACE) capsule 100 mg  100 mg Oral BID [name], [name], MD   100 mg at 04/01/20 5573  . haloperidol (HALDOL) tablet 5 mg  5 mg Oral Q6H PRN [name], [name], MD    5 mg at 03/31/20 2125   Or  . haloperidol lactate (HALDOL) injection 5 mg  5 mg Intramuscular Q6H PRN [name], [name], MD      . hydrOXYzine (ATARAX/VISTARIL) tablet 25 mg  25 mg Oral Q6H PRN Gillermo Murdoch, NP   25 mg at 03/31/20 2125  . magnesium hydroxide (MILK OF MAGNESIA) suspension 30 mL  30 mL Oral Daily PRN Gillermo Murdoch, NP      . OLANZapine zydis (ZYPREXA) disintegrating tablet 10 mg  10 mg Oral BID [name], Jackquline Denmark, MD   10 mg at 04/01/20 0926  . traZODone (DESYREL) tablet 50 mg  50 mg Oral QHS PRN Gillermo Murdoch, NP   50 mg at 03/31/20 2125     Abtx:  Anti-infectives (From admission, onward)   None      REVIEW OF SYSTEMS:  Const: negative fever, negative chills, negative weight loss Eyes: negative diplopia or visual changes, negative eye pain ENT: negative coryza, negative sore throat Resp: negative cough, hemoptysis, dyspnea Cards: negative for chest pain, palpitations, lower extremity edema GU: negative for frequency, dysuria and hematuria GI: Negative for abdominal pain, diarrhea, bleeding, constipation Skin: negative for rash and pruritus Heme: negative for easy bruising and gum/nose bleeding MS: negative for myalgias, arthralgias, back pain and muscle weakness  Neurolo:negative for headaches, dizziness, vertigo, memory problems  Allergy/Immunology- negative for any medication or food allergies  Objective:  Did not examine patient as he was moving around in his room and then started walking out   Pertinent Labs Lab Results CBC    Component Value Date/Time   WBC 3.3 (L) 03/25/2020 0308   RBC 3.32 (L) 03/25/2020 0308   HGB 10.6 (L) 03/25/2020 0308   HCT 33.2 (L) 03/25/2020 0308   PLT 190 03/25/2020 0308   MCV 100.0 03/25/2020 0308   MCH 31.9 03/25/2020 0308   MCHC 31.9 03/25/2020 0308   RDW 12.4 03/25/2020 0308   LYMPHSABS 1.1 03/25/2020 0308   MONOABS 0.4 03/25/2020 0308   EOSABS 0.1 03/25/2020 0308   BASOSABS 0.0 03/25/2020 0308     CMP Latest Ref Rng & Units 03/25/2020 11/18/2018  Glucose 70 - 99 mg/dL 93 498(Y)  BUN 6 - 20 mg/dL 8 10  Creatinine 6.41 - 1.24 mg/dL 5.83 0.94  Sodium 076 - 145 mmol/L 136 138  Potassium 3.5 - 5.1 mmol/L 4.2 3.7  Chloride 98 - 111 mmol/L 102 104  CO2 22 - 32 mmol/L 28 26  Calcium 8.9 - 10.3 mg/dL 8.9 9.9  Total Protein 6.5 - 8.1 g/dL 8.0(S) 10.8(H)  Total Bilirubin 0.3 - 1.2 mg/dL 0.4 0.6  Alkaline Phos 38 - 126 U/L 64 56  AST 15 - 41 U/L 32 34  ALT 0 - 44 U/L 19 23      Microbiology: Recent Results (from the past 240 hour(s))  Respiratory Panel by RT PCR (Flu A&B, Covid) - Nasopharyngeal Swab     Status: None   Collection Time: 03/25/20  3:08 AM   Specimen: Nasopharyngeal Swab  Result Value Ref Range Status   SARS Coronavirus 2 by RT PCR NEGATIVE NEGATIVE Final    Comment: (NOTE) SARS-CoV-2 target nucleic acids are NOT DETECTED. The SARS-CoV-2 RNA is generally detectable in upper respiratoy specimens during the acute phase of infection. The lowest concentration of SARS-CoV-2 viral copies this assay can detect is 131 copies/mL. A negative result does not preclude SARS-Cov-2 infection and should not be used as the sole basis for treatment or other patient management decisions. A negative result may occur with  improper specimen collection/handling, submission of specimen other than nasopharyngeal swab, presence of viral mutation(s) within the areas targeted by this assay, and inadequate number of viral copies (<131 copies/mL). A negative result must be combined with clinical observations, patient history, and epidemiological information. The expected result is Negative. Fact Sheet for Patients:  https://www.moore.com/ Fact Sheet for Healthcare Providers:  https://www.young.biz/ This test is not yet ap proved or cleared by the [place] FDA and  has been authorized for detection and/or diagnosis of SARS-CoV-2 by FDA under an  Emergency Use Authorization (EUA). This EUA will remain  in effect (meaning this test can be used) for the duration of the COVID-19 declaration under Section 564(b)(1) of the Act, 21 U.S.C. section 360bbb-3(b)(1), unless the authorization is terminated or revoked sooner.    Influenza A by PCR NEGATIVE NEGATIVE Final   Influenza B by PCR NEGATIVE NEGATIVE Final    Comment: (NOTE) The Xpert Xpress SARS-CoV-2/FLU/RSV assay is intended as an aid in  the diagnosis of influenza from Nasopharyngeal swab specimens and  should not be used as a sole basis for treatment. Nasal washings and  aspirates are unacceptable for Xpert Xpress SARS-CoV-2/FLU/RSV  testing. Fact Sheet for Patients: https://www.moore.com/ Fact Sheet for Healthcare Providers: https://www.young.biz/ This test is  not yet approved or cleared by the [place] and  has been authorized for detection and/or diagnosis of SARS-CoV-2 by  FDA under an Emergency Use Authorization (EUA). This EUA will remain  in effect (meaning this test can be used) for the duration of the  Covid-19 declaration under Section 564(b)(1) of the Act, 21  U.S.C. section 360bbb-3(b)(1), unless the authorization is  terminated or revoked. Performed at [facility], [place] [address], [place], [place] [address]     ? Impression/Recommendation ? HIV- positive since 2019 ( as per Epic records)) ?unsure whether he has been treated eventhough he said he has a doctor in [place] and is on meds- did not have the phone number of the doctor as he did not have his phone-   Will get Cd4  To assess his immune status Will get Viral load  as well Pt though refusing labs or Rx   RPR- was 1;1 in 2019 and neg TPA  RPR now is 1:4- TPA pending  If TPA positive may need LP  Leucopenia could be due to HIV  Anemia - likely of chronic disease  Manic episode- management per Dr.Klapacs  HIV usually causes  neurocognitive deficiency   Discussed with patient,and Dr.Klapacs

## 2020-04-01 NOTE — Progress Notes (Signed)
Patient alert and oriented x 4, affect is blunted he is assertive, mood is labile, his thoughts are disorganized and pressured , he appears needy and intrusive at the nursing station.  Patient was frequently redirected he was receptive to staff. Patient denies SI/HI/AVH, he appears less manic, less pacing around and interacting appropriately with peers and staff. No distress noted 15 minutes safety checks maintained will continue to monitor.

## 2020-04-01 NOTE — Plan of Care (Signed)
D- Patient alert and oriented. Patient presented in a pleasant mood on assessment stating that he slept "really good" last night and had no complaints to voice to this Clinical research associate. Patient denied SI, HI, AVH, and pain at this time, stating "never". Patient also denied any signs/symptons of depression/anxiety, reporting that "I feel great, amazing. Love is in the air". Patient's goal for today is "organization of thoughts", in which he will "write out a plan", in order to achieve his goal.  A- Scheduled medications administered to patient, per MD orders. Support and encouragement provided.  Routine safety checks conducted every 15 minutes.  Patient informed to notify staff with problems or concerns.  R- No adverse drug reactions noted. Patient contracts for safety at this time. Patient compliant with medications and treatment plan. Patient receptive, calm, and cooperative. Patient interacts well with others on the unit.  Patient remains safe at this time.  Problem: Education: Goal: Knowledge of [facility] Education information/materials will improve Outcome: Progressing Goal: Emotional status will improve Outcome: Progressing Goal: Mental status will improve Outcome: Progressing Goal: Verbalization of understanding the information provided will improve Outcome: Progressing   Problem: Safety: Goal: Periods of time without injury will increase Outcome: Progressing   Problem: Activity: Goal: Will verbalize the importance of balancing activity with adequate rest periods Outcome: Progressing   Problem: Safety: Goal: Ability to redirect hostility and anger into socially appropriate behaviors will improve Outcome: Progressing Goal: Ability to remain free from injury will improve Outcome: Progressing

## 2020-04-01 NOTE — Progress Notes (Signed)
BRIEF PHARMACY NOTE   This patient attended and participated in Medication Management Group counseling led by [facility] staff pharmacist.  This interactive class reviews basic information about prescription medications and education on personal responsibility in medication management.  The class also includes general knowledge of 3 main classes of behavioral medications, including antipsychotics, antidepressants, and mood stabilizers.     Patient behavior was appropriate for group setting.   Educational materials sourced from:  "Medication Do's and Don'ts" from [email or web address]   "Mental Health Medications" from [name] [email or web address] [id number]    Albina Billet, PharmD, BCPS Clinical Pharmacist 04/01/2020 3:23 PM

## 2020-04-01 NOTE — [facility] Group Notes (Signed)
[facility] Group Notes:  (Nursing/MHT/Case Management/Adjunct)  Date:  04/01/2020  Time:  10:39 PM  Type of Therapy:  Group Therapy  Participation Level:  Did Not Attend    Mark Galloway 04/01/2020, 10:39 PM

## 2020-04-01 NOTE — Progress Notes (Signed)
[name] MD Progress Note  04/01/2020 4:53 PM Mark Galloway  MRN:  [id number] Subjective: Patient seen chart reviewed.  Follow-up for this 34 year old man presenting with what appears to be a manic episode.  Spoke with the patient's mother as well.  She confirmed the recent history but did not have any specific new information other than to confirm that the patient did not really buy her house.  Patient has been taking his medicine since yesterday and seems slightly calmer although in interview today he is still inappropriate and uncooperative.  Particularly when I asked him about the consult with the infectious disease doctor and whether he had had treatment of his HIV disease in the past patient escalated and became extremely inappropriate with sexually inappropriate behavior and paranoia and hostility. Principal Problem: Severe manic bipolar 1 disorder with psychotic behavior ([name]) Diagnosis: Principal Problem:   Severe manic bipolar 1 disorder with psychotic behavior ([name]) Active Problems:   Anxiety   Psychosis ([name])   HIV positive ([name])  Total Time spent with patient: 30 minutes  Past Psychiatric History: Really no specifically known past mental health history other than this episode which according to the mother has been going on probably since the end of 2020  Past Medical History:  Past Medical History:  Diagnosis Date  . Shingles    History reviewed. No pertinent surgical history. Family History: History reviewed. No pertinent family history. Family Psychiatric  History: None known Social History:  Social History   Substance and Sexual Activity  Alcohol Use Yes   Comment: weekly     Social History   Substance and Sexual Activity  Drug Use Never    Social History   Socioeconomic History  . Marital status: Single    Spouse name: Not on file  . Number of children: Not on file  . Years of education: Not on file  . Highest education level: Not on file  Occupational History   . Not on file  Tobacco Use  . Smoking status: Current Some Day Smoker  . Smokeless tobacco: Never Used  Substance and Sexual Activity  . Alcohol use: Yes    Comment: weekly  . Drug use: Never  . Sexual activity: Not Currently  Other Topics Concern  . Not on file  Social History Narrative  . Not on file   Social Determinants of Health   Financial Resource Strain:   . Difficulty of Paying Living Expenses:   Food Insecurity:   . Worried About Programme researcher, broadcasting/film/video in the Last Year:   . Barista in the Last Year:   Transportation Needs:   . Freight forwarder (Medical):   Marland Kitchen Lack of Transportation (Non-Medical):   Physical Activity:   . Days of Exercise per Week:   . Minutes of Exercise per Session:   Stress:   . Feeling of Stress :   Social Connections:   . Frequency of Communication with Friends and Family:   . Frequency of Social Gatherings with Friends and Family:   . Attends Religious Services:   . Active Member of Clubs or Organizations:   . Attends Banker Meetings:   Marland Kitchen Marital Status:    Additional Social History:                         Sleep: Fair  Appetite:  Fair  Current Medications: Current Facility-Administered Medications  Medication Dose Route Frequency Provider Last Rate Last Admin  .  acetaminophen (TYLENOL) tablet 650 mg  650 mg Oral Q6H PRN Gillermo Murdoch, NP   650 mg at 03/31/20 1945  . alum & mag hydroxide-simeth (MAALOX/MYLANTA) 200-200-20 MG/5ML suspension 30 mL  30 mL Oral Q4H PRN Gillermo Murdoch, NP      . docusate sodium (COLACE) capsule 100 mg  100 mg Oral BID Karrah Mangini T, MD   100 mg at 04/01/20 2956  . haloperidol (HALDOL) tablet 5 mg  5 mg Oral Q6H PRN Hadleigh Felber T, MD   5 mg at 03/31/20 2125   Or  . haloperidol lactate (HALDOL) injection 5 mg  5 mg Intramuscular Q6H PRN Karely Hurtado T, MD      . hydrOXYzine (ATARAX/VISTARIL) tablet 25 mg  25 mg Oral Q6H PRN Gillermo Murdoch, NP   25  mg at 03/31/20 2125  . magnesium hydroxide (MILK OF MAGNESIA) suspension 30 mL  30 mL Oral Daily PRN Gillermo Murdoch, NP      . OLANZapine zydis (ZYPREXA) disintegrating tablet 15 mg  15 mg Oral BID Aliya Sol T, MD      . traZODone (DESYREL) tablet 50 mg  50 mg Oral QHS PRN Gillermo Murdoch, NP   50 mg at 03/31/20 2125    Lab Results: No results found for this or any previous visit (from the past 48 hour(s)).  Blood Alcohol level:  Lab Results  Component Value Date   ETH <10 03/25/2020    Metabolic Disorder Labs: No results found for: HGBA1C, MPG No results found for: PROLACTIN No results found for: CHOL, TRIG, HDL, CHOLHDL, VLDL, LDLCALC  Physical Findings: AIMS:  , ,  ,  ,    CIWA:    COWS:     Musculoskeletal: Strength & Muscle Tone: within normal limits Gait & Station: normal Patient leans: N/A  Psychiatric Specialty Exam: Physical Exam  Nursing note and vitals reviewed. Constitutional: He appears well-developed and well-nourished.  HENT:  Head: Normocephalic and atraumatic.  Eyes: Pupils are equal, round, and reactive to light. Conjunctivae are normal.  Cardiovascular: Regular rhythm and normal heart sounds.  Respiratory: Effort normal. No respiratory distress.  GI: Soft.  Musculoskeletal:        General: Normal range of motion.     Cervical back: Normal range of motion.  Neurological: He is alert.  Skin: Skin is warm and dry.  Psychiatric: His affect is labile and inappropriate. His speech is rapid and/or pressured and tangential. He is agitated. He is not aggressive. Cognition and memory are impaired. He expresses impulsivity and inappropriate judgment. He expresses no homicidal and no suicidal ideation.    Review of Systems  Constitutional: Negative.   HENT: Negative.   Eyes: Negative.   Respiratory: Negative.   Cardiovascular: Negative.   Gastrointestinal: Negative.   Musculoskeletal: Negative.   Skin: Negative.   Neurological: Negative.    Psychiatric/Behavioral: Negative.     Blood pressure (!) 118/59, pulse 72, temperature 97.8 F (36.6 C), temperature source Oral, resp. rate 17, height 5\' 9"  (1.753 m), weight 74.8 kg, SpO2 100 %.Body mass index is 24.35 kg/m.  General Appearance: Fairly Groomed  Eye Contact:  Good  Speech:  Pressured  Volume:  Increased  Mood:  Euphoric  Affect:  Inappropriate and Labile  Thought Process:  Disorganized  Orientation:  Full (Time, Place, and Person)  Thought Content:  Illogical, Paranoid Ideation, Rumination and Tangential  Suicidal Thoughts:  No  Homicidal Thoughts:  No  Memory:  Immediate;   Fair Recent;   Fair  Remote;   Fair  Judgement:  Poor  Insight:  Shallow  Psychomotor Activity:  Increased and Restlessness  Concentration:  Concentration: Poor  Recall:  [employer] of Knowledge:  Fair  Language:  Fair  Akathisia:  No  Handed:  Right  AIMS (if indicated):     Assets:  Financial Resources/Insurance Housing Resilience Social Support Talents/Skills  ADL's:  Impaired  Cognition:  Impaired,  Mild  Sleep:  Number of Hours: 8     Treatment Plan Summary: Daily contact with patient to assess and evaluate symptoms and progress in treatment, Medication management and Plan Patient continues with symptoms of mania.  He is less agitated than he was yesterday to the degree that he can be out in public on the unit better but he still is having episodes when he will start singing loudly in the hallway, there have been episodes this afternoon of him loudly taunting other patients and during the interview with me he became bizarrely sexually inappropriate throwing kisses at me and sarcastically calling me names simply because I ask him about his history of treatment.  Patient's dose of olanzapine will be increased to 15 mg twice a day.  Alethia Berthold, MD 04/01/2020, 4:53 PM

## 2020-04-01 NOTE — Progress Notes (Signed)
Recreation Therapy Notes   Date: 04/01/2020  Time: 9:30 am  Location: Craft Room  Behavioral response: Appropriate,redirection needed  Intervention Topic: Anger management    Discussion/Intervention:  Group content on today was focused on anger management. The group defined anger and reasons they become angry. Individuals expressed negative way they have dealt with anger in the past. Patients stated some positive ways they could deal with anger in the future. The group described how anger can affect your health and daily plans. Individuals participated in the intervention "Blowing up anger" where they had a chance to answer questions about themselves and see how much energy they waste on being angry.  Clinical Observations/Feedback:  Patient came to group and explained that being angry normally affects him than others. He explained that anger can cause chemical levels to go up and down which can affect your health. Participant express that seeking professional help could improve his anger management skills. Individual was social with peers and staff while participating in the intervention during group.  Quanika Solem LRT/CTRS        Shaneen Reeser 04/01/2020 11:01 AM

## 2020-04-02 MED ORDER — LORAZEPAM 1 MG PO TABS
1.0000 mg | ORAL_TABLET | Freq: Once | ORAL | Status: AC
  Administered 2020-04-02: 22:00:00 1 mg via ORAL
  Filled 2020-04-02: qty 1

## 2020-04-02 MED ORDER — DIPHENHYDRAMINE HCL 25 MG PO CAPS
50.0000 mg | ORAL_CAPSULE | Freq: Once | ORAL | Status: AC
  Administered 2020-04-02: 22:00:00 50 mg via ORAL
  Filled 2020-04-02: qty 2

## 2020-04-02 NOTE — Progress Notes (Signed)
[facility] MD Progress Note  04/02/2020 12:23 PM Mark Galloway  MRN:  [id number]   Mark Galloway is a 34yo M with h/o bipolar disorder, who was admitted to [facility] unit due to acute manic episode.  Patient seen.  Chart reviewed. Patient discussed with nursing; no overnight events reported.  Subjective:   Patient is calm, coo[perative and flirtatious. He reports feeling "better" after the dose of his medication was increased. Reports better sleep at night and taking naps during the day. Reports he has been feeling calmer overall since he is taking that medicine. He disagree with his diagnosis of bipolar disorder and thinks he has an OCD. He says "I clean like crazy all the time" and states this behavior is also calmer since he is on medication. He reports he is planning to continue taking the medicine as he likes how he feels on in and because it makes his mother happy. He denies thoughts of harming self or others. Denies any hallucinations. Denies side effects from current medications.  Principal Problem: Severe manic bipolar 1 disorder with psychotic behavior ([name]) Diagnosis: Principal Problem:   Severe manic bipolar 1 disorder with psychotic behavior ([name]) Active Problems:   Anxiety   Psychosis ([name])   HIV positive ([name])  Total Time spent with patient: 15 minutes  Past Psychiatric History: as per H&P  Past Medical History:  Past Medical History:  Diagnosis Date  . Shingles    History reviewed. No pertinent surgical history. Family History: History reviewed. No pertinent family history. Family Psychiatric  History: as per H&P Social History:  Social History   Substance and Sexual Activity  Alcohol Use Yes   Comment: weekly     Social History   Substance and Sexual Activity  Drug Use Never    Social History   Socioeconomic History  . Marital status: Single    Spouse name: Not on file  . Number of children: Not on file  . Years of education: Not on file  . Highest education level: Not  on file  Occupational History  . Not on file  Tobacco Use  . Smoking status: Current Some Day Smoker  . Smokeless tobacco: Never Used  Substance and Sexual Activity  . Alcohol use: Yes    Comment: weekly  . Drug use: Never  . Sexual activity: Not Currently  Other Topics Concern  . Not on file  Social History Narrative  . Not on file   Social Determinants of Health   Financial Resource Strain:   . Difficulty of Paying Living Expenses:   Food Insecurity:   . Worried About Programme researcher, broadcasting/film/video in the Last Year:   . Barista in the Last Year:   Transportation Needs:   . Freight forwarder (Medical):   Marland Kitchen Lack of Transportation (Non-Medical):   Physical Activity:   . Days of Exercise per Week:   . Minutes of Exercise per Session:   Stress:   . Feeling of Stress :   Social Connections:   . Frequency of Communication with Friends and Family:   . Frequency of Social Gatherings with Friends and Family:   . Attends Religious Services:   . Active Member of Clubs or Organizations:   . Attends Banker Meetings:   Marland Kitchen Marital Status:    Additional Social History:                         Sleep: Good  Appetite:  Good  Current Medications: Current Facility-Administered Medications  Medication Dose Route Frequency Provider Last Rate Last Admin  . acetaminophen (TYLENOL) tablet 650 mg  650 mg Oral Q6H PRN Gillermo Murdoch, NP   650 mg at 04/02/20 0748  . alum & mag hydroxide-simeth (MAALOX/MYLANTA) 200-200-20 MG/5ML suspension 30 mL  30 mL Oral Q4H PRN Gillermo Murdoch, NP      . docusate sodium (COLACE) capsule 100 mg  100 mg Oral BID [name], [name], MD   100 mg at 04/02/20 0748  . haloperidol (HALDOL) tablet 5 mg  5 mg Oral Q6H PRN [name], [name], MD   5 mg at 03/31/20 2125   Or  . haloperidol lactate (HALDOL) injection 5 mg  5 mg Intramuscular Q6H PRN [name], [name], MD      . hydrOXYzine (ATARAX/VISTARIL) tablet 25 mg  25 mg Oral Q6H  PRN Gillermo Murdoch, NP   25 mg at 04/01/20 2043  . magnesium hydroxide (MILK OF MAGNESIA) suspension 30 mL  30 mL Oral Daily PRN Gillermo Murdoch, NP      . OLANZapine zydis (ZYPREXA) disintegrating tablet 15 mg  15 mg Oral BID [name], Jackquline Denmark, MD   15 mg at 04/02/20 0748  . traZODone (DESYREL) tablet 50 mg  50 mg Oral QHS PRN Gillermo Murdoch, NP   50 mg at 04/01/20 2043    Lab Results: No results found for this or any previous visit (from the past 48 hour(s)).  Blood Alcohol level:  Lab Results  Component Value Date   ETH <10 03/25/2020    Metabolic Disorder Labs: No results found for: HGBA1C, MPG No results found for: PROLACTIN No results found for: CHOL, TRIG, HDL, CHOLHDL, VLDL, LDLCALC  Physical Findings: AIMS:  , ,  ,  ,    CIWA:    COWS:     Musculoskeletal: Strength & Muscle Tone: within normal limits Gait & Station: normal Patient leans: N/A  Psychiatric Specialty Exam: Physical Exam  Review of Systems  Blood pressure 120/71, pulse 74, temperature 98.4 F (36.9 C), temperature source Oral, resp. rate 17, height 5\' 9"  (1.753 m), weight 74.8 kg, SpO2 95 %.Body mass index is 24.35 kg/m.  General Appearance: Fairly Groomed  Eye Contact:  Good  Speech:  Pressured  Volume:  Increased  Mood:  Euphoric  Affect:  Appropriate, Congruent and Full Range  Thought Process:  Coherent and Goal Directed  Orientation:  Full (Time, Place, and Person)  Thought Content:  Illogical  Suicidal Thoughts:  No  Homicidal Thoughts:  No  Memory:  Immediate;   Fair Recent;   Fair Remote;   Fair  Judgement:  Fair  Insight:  Shallow  Psychomotor Activity:  Increased  Concentration:  Concentration: Fair and Attention Span: Fair  Recall:  of Knowledge:  Fair  Language:  Good  Akathisia:  No  Handed:  Right  AIMS (if indicated):     Assets:  Communication Skills Desire for Improvement Housing Social Support  ADL's:  Intact  Cognition:  WNL  Sleep:   Number of Hours: 7.75     Treatment Plan Summary: Daily contact with patient to assess and evaluate symptoms and progress in treatment and Medication management  34yo M with h/o bipolar disorder, who was admitted to [facility] unit due to acute manic episode. Patient appears calmer and more cooperative, although he still expresses symptoms of acute mania - he is hyperactive in a milieu, hypertalkative, flirtatious. He expresses poor insight in his  condition, although his judgement is improving. The dose of antipsychotic was increased yesterday, so it will be left without changes today.      Impression: Bipolar disorder, acute manic episode.    Plan: -continue inpatient psych admission; 15-minute checks; daily contact with patient to assess and evaluate symptoms and progress in treatment; psychoeducation.  -continue Zyprexa 15mg  PO BID for mood stabilization and psychosis.   -Disposition: to be determined.  Larita Fife, MD 04/02/2020, 12:23 PM

## 2020-04-02 NOTE — [facility] Group Notes (Signed)
[facility] Group Notes:  (Nursing/MHT/Case Management/Adjunct)  Date:  04/02/2020  Time:  5:34 PM  Type of Therapy:  Psychoeducational Skills  Participation Level:  Active  Participation Quality:  Appropriate  Affect:  Appropriate  Cognitive:  Alert and Appropriate  Insight:  Appropriate and Good  Engagement in Group:  Engaged  Modes of Intervention:  Clarification  Summary of Progress/Problems:  Mark Galloway 04/02/2020, 5:34 PM

## 2020-04-02 NOTE — Plan of Care (Signed)
D: Patient denies SI/HI/AVH. Patient verbally contracts for safety. Patient is calm, cooperative and pleasant. Patient is seen in milieu interacting with peers. Patient has no complaints at this time.Patient completed self inventory.   A: Patient was assessed by this nurse. Patient was oriented to unit. Patient's safety was maintained on unit. Q x 15 minute observation checks were completed for safety. Patient care plan was reviewed. Patient was offered support and encouragement. Patient was encourage to attend groups, participate in unit activities and continue with plan of care.    R: Patient has no complaints of pain at this time. Patient is receptive to treatment and safety maintained on unit.     Problem: Education: Goal: Knowledge of [place] [facility] Education information/materials will improve Outcome: Progressing Goal: Verbalization of understanding the information provided will improve Outcome: Progressing   Problem: Safety: Goal: Periods of time without injury will increase Outcome: Progressing   Problem: Safety: Goal: Ability to redirect hostility and anger into socially appropriate behaviors will improve Outcome: Progressing

## 2020-04-02 NOTE — [facility] Group Notes (Signed)
[facility] Group Notes:  (Nursing/MHT/Case Management/Adjunct)  Date:  04/02/2020  Time:  8:58 PM  Type of Therapy:  Group Therapy  Participation Level:  Did Not Attend  Participation Quality:  He said he is sick.  Summary of Progress/Problems:  Mayra Neer 04/02/2020, 8:58 PM

## 2020-04-02 NOTE — [facility] Group Notes (Signed)
LCSW Group Therapy Note  04/02/2020 1:00pm  Type of Therapy and Topic:  Group Therapy:  Cognitive Distortions  Participation Level:  Active   Description of Group:    Patients in this group will be introduced to the topic of cognitive distortions.  Patients will identify and describe cognitive distortions, describe the feelings these distortions create for them.  Patients will identify one or more situations in their personal life where they have cognitively distorted thinking and will verbalize challenging this cognitive distortion through positive thinking skills.  Patients will practice the skill of using positive affirmations to challenge cognitive distortions using affirmation cards.    Therapeutic Goals:  1. Patient will identify two or more cognitive distortions they have used 2. Patient will identify one or more emotions that stem from use of a cognitive distortion 3. Patient will demonstrate use of a positive affirmation to counter a cognitive distortion through discussion and/or role play. 4. Patient will describe one way cognitive distortions can be detrimental to wellness   Summary of Patient Progress:  Pt identified current cognitive distortions he uses to process events in her life. The pt was able to identify a current issue, the automatic thought and challenge and process a healthier thought process regarding the identified issue.    Therapeutic Modalities:   Cognitive Behavioral Therapy Motivational Interviewing   Teresita Madura, MSW, [facility] Clinical Social Worker  04/02/2020 5:08 PM

## 2020-04-03 LAB — HIV-1 RNA QUANT-NO REFLEX-BLD
HIV 1 RNA Quant: 157000 copies/mL
LOG10 HIV-1 RNA: 5.196 log10copy/mL

## 2020-04-03 NOTE — Plan of Care (Signed)
Patient remains somatic, and requires redirection to stay on task, at shift change patient complained of his tongue swelling but after constant reassessment and calling the provider on call patient was able to calm down. He was seen in the dayroom singing and dancing with peers. He appears to be attention seeking and argumentative with staff during the evening. He is currently in bed resting quietly eyes closed at this time.

## 2020-04-03 NOTE — Plan of Care (Signed)
D- Patient alert and oriented. Patient presents in a pleasant mood on assessment stating that he slept good last night and had no complaints to voice to this Clinical research associate. Patient denies SI, HI, AVH, and pain at this time. Patient also denies any signs/symptoms of depression/anxiety, reporting that overall, he is feeling "amazing". Patient's goal for today is to "organize materials", in which he will "spend more time in my room", in order to achieve his goals.  A- Scheduled medications administered to patient, per MD orders. Support and encouragement provided.  Routine safety checks conducted every 15 minutes.  Patient informed to notify staff with problems or concerns.  R- No adverse drug reactions noted. Patient contracts for safety at this time. Patient compliant with medications and treatment plan. Patient receptive, calm, and cooperative. Patient interacts well with others on the unit.  Patient remains safe at this time.  Problem: Education: Goal: Knowledge of [place] [facility] Education information/materials will improve Outcome: Progressing Goal: Emotional status will improve Outcome: Progressing Goal: Mental status will improve Outcome: Progressing Goal: Verbalization of understanding the information provided will improve Outcome: Progressing   Problem: Safety: Goal: Periods of time without injury will increase Outcome: Progressing   Problem: Activity: Goal: Will verbalize the importance of balancing activity with adequate rest periods Outcome: Progressing   Problem: Safety: Goal: Ability to redirect hostility and anger into socially appropriate behaviors will improve Outcome: Progressing Goal: Ability to remain free from injury will improve Outcome: Progressing

## 2020-04-03 NOTE — Progress Notes (Signed)
Mark Galloway  04/03/2020 11:21 AM Mark Galloway  MRN:  [id number]   Mark Galloway is a 34yo M with h/o bipolar disorder, who was admitted to [facility] unit due to acute manic episode.  Patient seen. Chart reviewed. Patient discussed with nursing; last night patient complained about swollen tongue, received one dose of Benadryl with subjective improvement; no other overnight events reported.  Subjective:   Patient appears more disorganized today and his room looks trashed. He reports feeling "good". Denies any mental or physical complaints, denies tongue swelling. Reports 'okay" sleep at night and taking naps during the day. He denies thoughts of harming self or others. Denies any hallucinations.  Attempted to talk to patient about his active PRP and HIV tests. Patient refused any treatment for that in the hospital. He states he follows with outpatient infection disease clinic in [place]. 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   Principal Problem: Severe manic bipolar 1 disorder with psychotic behavior ([place]) Diagnosis: Principal Problem:   Severe manic bipolar 1 disorder with psychotic behavior ([place]) Active Problems:   Anxiety   Psychosis ([place])   HIV positive ([place])  Total Time spent with patient: 15 minutes  Past Psychiatric History: as per H&P  Past Medical History:  Past Medical History:  Diagnosis Date  . Shingles    History reviewed. No pertinent surgical history. Family History: History reviewed. No pertinent family history. Family Psychiatric  History: as per H&P Social History:  Social History   Substance and Sexual Activity  Alcohol Use Yes   Comment: weekly     Social History   Substance and Sexual Activity  Drug Use Never    Social History   Socioeconomic History  . Marital status: Single    Spouse name: Not on file  . Number of children: Not on file  . Years of education: Not on file  . Highest education level: Not on file  Occupational  History  . Not on file  Tobacco Use  . Smoking status: Current Some Day Smoker  . Smokeless tobacco: Never Used  Substance and Sexual Activity  . Alcohol use: Yes    Comment: weekly  . Drug use: Never  . Sexual activity: Not Currently  Other Topics Concern  . Not on file  Social History Narrative  . Not on file   Social Determinants of Health   Financial Resource Strain:   . Difficulty of Paying Living Expenses:   Food Insecurity:   . Worried About Charity fundraiser in the Last Year:   . Arboriculturist in the Last Year:   Transportation Needs:   . Film/video editor (Medical):   Marland Kitchen Lack of Transportation (Non-Medical):   Physical Activity:   . Days of Exercise per Week:   . Minutes of Exercise per Session:   Stress:   . Feeling of Stress :   Social Connections:   . Frequency of Communication with Friends and Family:   . Frequency of Social Gatherings with Friends and Family:   . Attends Religious Services:   . Active Member of Clubs or Organizations:   . Attends Archivist Meetings:   Marland Kitchen Marital Status:    Additional Social History:                         Sleep: Good  Appetite:  Good  Current Medications: Current Facility-Administered Medications  Medication Dose Route Frequency Provider Last Rate Last Admin  . acetaminophen (TYLENOL) tablet 650 mg  650 mg Oral Q6H PRN Gillermo Murdoch, NP   650 mg at 04/03/20 0701  . alum & mag hydroxide-simeth (MAALOX/MYLANTA) 200-200-20 MG/5ML suspension 30 mL  30 mL Oral Q4H PRN Gillermo Murdoch, NP   30 mL at 04/02/20 1619  . docusate sodium (COLACE) capsule 100 mg  100 mg Oral BID [name], [name], MD   100 mg at 04/03/20 0916  . haloperidol (HALDOL) tablet 5 mg  5 mg Oral Q6H PRN [name], [name], MD   5 mg at 03/31/20 2125   Or  . haloperidol lactate (HALDOL) injection 5 mg  5 mg Intramuscular Q6H PRN [name], [name], MD      . hydrOXYzine (ATARAX/VISTARIL) tablet 25 mg  25 mg Oral Q6H PRN  Gillermo Murdoch, NP   25 mg at 04/02/20 1929  . magnesium hydroxide (MILK OF MAGNESIA) suspension 30 mL  30 mL Oral Daily PRN Gillermo Murdoch, NP      . OLANZapine zydis (ZYPREXA) disintegrating tablet 15 mg  15 mg Oral BID [name], Jackquline Denmark, MD   15 mg at 04/03/20 0916  . traZODone (DESYREL) tablet 50 mg  50 mg Oral QHS PRN Gillermo Murdoch, NP   50 mg at 04/02/20 2151    Lab Results: No results found for this or any previous visit (from the past 48 hour(s)).  Blood Alcohol level:  Lab Results  Component Value Date   ETH <10 03/25/2020    Metabolic Disorder Labs: No results found for: HGBA1C, MPG No results found for: PROLACTIN No results found for: CHOL, TRIG, HDL, CHOLHDL, VLDL, LDLCALC  Physical Findings: AIMS:  , ,  ,  ,    CIWA:    COWS:     Musculoskeletal: Strength & Muscle Tone: within normal limits Gait & Station: normal Patient leans: N/A  Psychiatric Specialty Exam: Physical Exam   Review of Systems   Blood pressure 111/64, pulse 91, temperature 98.5 F (36.9 C), temperature source Oral, resp. rate 18, height 5\' 9"  (1.753 m), weight 74.8 kg, SpO2 100 %.Body mass index is 24.35 kg/m.  General Appearance: Casual  Eye Contact:  Good  Speech:  Pressured  Volume:  Increased  Mood:  Euphoric  Affect:  Appropriate, Congruent and Full Range  Thought Process:  Coherent and Goal Directed  Orientation:  Full (Time, Place, and Person)  Thought Content:  Illogical Grandiose delusions  Suicidal Thoughts:  No  Homicidal Thoughts:  No  Memory:  Immediate;   Fair Recent;   Fair Remote;   Fair  Judgement:  Fair  Insight:  Shallow  Psychomotor Activity:  Increased  Concentration:  Concentration: Fair and Attention Span: Fair  Recall:  of Knowledge:  Fair  Language:  Good  Akathisia:  No  Handed:  Right  AIMS (if indicated):     Assets:  Communication Skills Desire for Improvement Housing Social Support  ADL's:  Intact  Cognition:  WNL   Sleep:  Number of Hours: 5     Treatment Plan Summary: Daily contact with patient to assess and evaluate symptoms and progress in treatment and Medication management  34yo M with h/o bipolar disorder, who was admitted to [facility] unit due to acute manic episode. Patient appears calmer overall compare to admission, although he still expresses symptoms of acute mania - hyperactivity, grandiose delusions, poor insight in his condition. The patient is compliant with medications though.  The dose of antipsychotic was increased two days ago will not be changed today.      Impression: Bipolar disorder, acute manic episode.    Plan: -continue inpatient psych admission; 15-minute checks; daily contact with patient to assess and evaluate symptoms and progress in treatment; psychoeducation.  -continue Zyprexa 15mg  PO BID for mood stabilization and psychosis.   -Disposition: to be determined.  , MD 04/03/2020, 11:21 AM

## 2020-04-03 NOTE — [facility] Group Notes (Signed)
04/03/2020 1:00pm   Type of Therapy and Topic:  Group Therapy:  Change and Accountability  Participation Level:  None  Description of Group In this group, patients discussed power and accountability for change.  The group identified the challenges related to accountability and the difficulty of accepting the outcomes of negative behaviors.  Patients were encouraged to openly discuss a challenge/change they could take responsibility for.  Patients discussed the use of "change talk" and positive thinking as ways to support achievement of personal goals.  The group discussed ways to give support and empowerment to peers.  Therapeutic Goals: 1. Patients will state the relationship between personal power and accountability in the change process 2. Patients will identify the positive and negative consequences of a personal choice they have made 3. Patients will identify one challenge/choice they will take responsibility for making 4. Patients will discuss the role of "change talk" and the impact of positive thinking as it supports successful personal change 5. Patients will verbalize support and affirmation of change efforts in peers  Summary of Patient Progress:  The patient attended the group 10 minutes before it ended.   Therapeutic Modalities Solution Focused Brief Therapy Motivational Interviewing Cognitive Behavioral Therapy    Teresita Madura, MSW, [facility] Clinical Social Worker  04/03/2020 3:45 PM

## 2020-04-04 LAB — HELPER T-LYMPH-CD4 (ARMC ONLY)
% CD 4 Pos. Lymph.: 8.7 % — ABNORMAL LOW (ref 30.8–58.5)
Absolute CD 4 Helper: 122 /uL — ABNORMAL LOW (ref 359–1519)
Basophils Absolute: 0 10*3/uL (ref 0.0–0.2)
Basos: 0 %
EOS (ABSOLUTE): 0.1 10*3/uL (ref 0.0–0.4)
Eos: 4 %
Hematocrit: 35.6 % — ABNORMAL LOW (ref 37.5–51.0)
Hemoglobin: 11.6 g/dL — ABNORMAL LOW (ref 13.0–17.7)
Immature Grans (Abs): 0 10*3/uL (ref 0.0–0.1)
Immature Granulocytes: 1 %
Lymphocytes Absolute: 1.4 10*3/uL (ref 0.7–3.1)
Lymphs: 50 %
MCH: 32.3 pg (ref 26.6–33.0)
MCHC: 32.6 g/dL (ref 31.5–35.7)
MCV: 99 fL — ABNORMAL HIGH (ref 79–97)
Monocytes Absolute: 0.4 10*3/uL (ref 0.1–0.9)
Monocytes: 14 %
Neutrophils Absolute: 0.9 10*3/uL — ABNORMAL LOW (ref 1.4–7.0)
Neutrophils: 31 %
Platelets: 183 10*3/uL (ref 150–450)
RBC: 3.59 x10E6/uL — ABNORMAL LOW (ref 4.14–5.80)
RDW: 12.3 % (ref 11.6–15.4)
WBC: 2.9 10*3/uL — ABNORMAL LOW (ref 3.4–10.8)

## 2020-04-04 MED ORDER — PENICILLIN G BENZATHINE 1200000 UNIT/2ML IM SUSP
2.4000 10*6.[IU] | INTRAMUSCULAR | Status: DC
  Administered 2020-04-04: 16:00:00 2.4 10*6.[IU] via INTRAMUSCULAR
  Filled 2020-04-04: qty 4

## 2020-04-04 MED ORDER — LITHIUM CARBONATE ER 300 MG PO TBCR
300.0000 mg | EXTENDED_RELEASE_TABLET | Freq: Two times a day (BID) | ORAL | Status: DC
  Administered 2020-04-04 – 2020-04-05 (×3): 300 mg via ORAL
  Filled 2020-04-04 (×3): qty 1

## 2020-04-04 MED ORDER — POLYETHYLENE GLYCOL 3350 17 G PO PACK: 17.0000 g | PACK | Freq: Every day | ORAL | Status: DC | PRN

## 2020-04-04 NOTE — [facility] Group Notes (Signed)
LCSW Group Therapy Note   04/04/2020 1:58 PM   Type of Therapy and Topic:  Group Therapy:  Overcoming Obstacles   Participation Level:  Active   Description of Group:    In this group patients will be encouraged to explore what they see as obstacles to their own wellness and recovery. They will be guided to discuss their thoughts, feelings, and behaviors related to these obstacles. The group will process together ways to cope with barriers, with attention given to specific choices patients can make. Each patient will be challenged to identify changes they are motivated to make in order to overcome their obstacles. This group will be process-oriented, with patients participating in exploration of their own experiences as well as giving and receiving support and challenge from other group members.   Therapeutic Goals: 1. Patient will identify personal and current obstacles as they relate to admission. 2. Patient will identify barriers that currently interfere with their wellness or overcoming obstacles.  3. Patient will identify feelings, thought process and behaviors related to these barriers. 4. Patient will identify two changes they are willing to make to overcome these obstacles:      Summary of Patient Progress Pt came to group late and was an active participant once he arrived. Pt was intrusive at times and had to be redirected. For example, pt was rushing another group member to end their discussion saying "you need to wrap it up". Pt appeared to be very attention seeking while in group as well and had to stand every time he shared something to the group.     Therapeutic Modalities:   Cognitive Behavioral Therapy Solution Focused Therapy Motivational Interviewing Relapse Prevention Therapy  Iris Pert, MSW, LCSW Clinical Social Work 04/04/2020 1:58 PM

## 2020-04-04 NOTE — Plan of Care (Signed)
D- Patient alert and oriented. Patient presents in an animated, but pleasant mood on assessment stating that he slept ok last night and had no complaints to voice to this Clinical research associate. Patient denies any signs/symptoms of depression/anxiety. Patient also denies SI, HI, AVH, and pain at this time. Patient has been inappropriate at times throughout the day, however, he can be easily redirectable. Patient's goal for today is to "help others".    A- Scheduled medications administered to patient, per MD orders. Support and encouragement provided.  Routine safety checks conducted every 15 minutes.  Patient informed to notify staff with problems or concerns.  R- No adverse drug reactions noted. Patient contracts for safety at this time. Patient compliant with medications and treatment plan. Patient receptive, calm, and cooperative. Patient interacts well with others on the unit.  Patient remains safe at this time.  Problem: Education: Goal: Knowledge of [facility] Education information/materials will improve Outcome: Progressing Goal: Emotional status will improve Outcome: Progressing Goal: Mental status will improve Outcome: Progressing Goal: Verbalization of understanding the information provided will improve Outcome: Progressing   Problem: Safety: Goal: Periods of time without injury will increase Outcome: Progressing   Problem: Activity: Goal: Will verbalize the importance of balancing activity with adequate rest periods Outcome: Progressing   Problem: Safety: Goal: Ability to redirect hostility and anger into socially appropriate behaviors will improve Outcome: Progressing Goal: Ability to remain free from injury will improve Outcome: Progressing

## 2020-04-04 NOTE — Progress Notes (Signed)
Recreation Therapy Notes  Date: 04/04/2020  Time: 9:30 am  Location: Craft Room  Behavioral response: Appropriate   Intervention Topic: Happiness   Discussion/Intervention:  Group content today was focused on Happiness. The group defined happiness and described where happiness comes from. Individuals identified what makes them happy and how they go about making others happy. Patients expressed things that stop them from being happy and ways they can improve their happiness. The group stated reasons why it is important to be happy. The group participated in the intervention "My Happiness", where they had a chance to identify and express things that make them happy. Clinical Observations/Feedback:  Patient came to group and explained that [name], [name] and dancing makes him happy. He left group early and did not return.  Kenwood Rosiak LRT/CTRS          Mark Galloway 04/04/2020 11:36 AM

## 2020-04-04 NOTE — Progress Notes (Addendum)
Patient was in his room upon arrival to the unit. Patient stated during assessment it was a bad day on the unit. Patient stated, "This girl is saying shit to me but everyone keeps blaming me. I didn't do anything." Patient given support and encouragement. Patient denies SI/HI/AVH, pain, anxiety and depression with this Clinical research associate. Pt given education. Patient being monitored Q 15 minutes for safety per unit protocol. Patient remains safe on the unit.   Patient had to be taken to the second dayroom after getting into a verbal altercation with another patient.

## 2020-04-04 NOTE — Plan of Care (Signed)
  Problem: Education: Goal: Knowledge of [facility] Education information/materials will improve Outcome: Progressing Goal: Emotional status will improve Outcome: Progressing Goal: Mental status will improve Outcome: Progressing Goal: Verbalization of understanding the information provided will improve Outcome: Progressing  D: Patient has been calm, pleasant and cooperative. Voice is somewhat hoarse. Patient says he thinks he has allergies. Denies SI HI and AVH.  A: Continue to monitor for safety R: Safety maintained.

## 2020-04-04 NOTE — Plan of Care (Signed)
Pt had to be taken to the second dayroom to watch TV this evening after getting into a verbal altercation with another patient.   Problem: Education: Goal: Emotional status will improve Outcome: Not Progressing Goal: Mental status will improve Outcome: Not Progressing

## 2020-04-04 NOTE — Progress Notes (Signed)
   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

## 2020-04-04 NOTE — Progress Notes (Signed)
D: Patient has been calm, pleasant and cooperative. Voice is somewhat hoarse. Patient says he thinks he has allergies. Denies SI HI and AVH.  A: Continue to monitor for safety R: Safety maintained.

## 2020-04-04 NOTE — Progress Notes (Signed)
[facility] MD Progress Note  04/04/2020 1:59 PM Mark Galloway  MRN:  [id number]    Subjective: Follow-up for this 34 year old male diagnosed with bipolar 1 disorder.  Patient reports that he is doing "great" today.  He states that he has been continue his medications and that he has been sleeping good and eating good as well.  Patient reports that he feels like his knee is doing much better since he has been here.  Patient then states that he bought his mother's house and he is upset because she would not allow him back in the house and she locked him out.  He continues reporting and asking me how I would feel if my mom and denied to me.  Patient was informed that I am not in the same situation as he is.  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  He also reports that he has a private doctor to treat his HIV in [place] and he does not want to discuss it with me and then there is none of my business and he does not need to start medication at this time.  Principal Problem: Severe manic bipolar 1 disorder with psychotic behavior ([place]) Diagnosis: Principal Problem:   Severe manic bipolar 1 disorder with psychotic behavior ([place]) Active Problems:   Anxiety   Psychosis ([place])   HIV positive ([place])  Total Time spent with patient: 30 minutes  Past Psychiatric History: Little information available.  The patient claims he has never seen any kind of mental health provider in the past and claims to have never been prescribed any psychiatric medicine.  Notes report that he had said in [place] that he had a therapist named "Amber Rose".  When I asked him about that today he came up with some odd tangential excuse for it.  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  I do not know if that is true or what other follow-up there is to that.  Past Medical History:  Past Medical History:  Diagnosis Date  . Shingles    History reviewed. No pertinent surgical history. Family History: History reviewed. No pertinent family history. Family Psychiatric  History: The patient tells me he thinks that people in his family have "schizo bipolar disorder".  I asked him how he knew that he said that they just act funny. Social History:  Social History   Substance and Sexual Activity  Alcohol Use Yes   Comment: weekly     Social History   Substance and Sexual Activity  Drug Use Never    Social History   Socioeconomic History  . Marital status: Single    Spouse name: Not on file  . Number of children: Not on file  . Years of education: Not on file  . Highest education level: Not on file  Occupational History  . Not on file  Tobacco Use  . Smoking status: Current Some Day Smoker  . Smokeless tobacco: Never Used  Substance and Sexual Activity  . Alcohol use: Yes    Comment: weekly  . Drug use: Never  . Sexual activity: Not Currently  Other Topics Concern  .  Not on file  Social History Narrative  . Not on file   Social Determinants of Health   Financial Resource Strain:   . Difficulty of Paying Living Expenses:   Food Insecurity:   . Worried About Programme researcher, broadcasting/film/video in the Last Year:   . Barista in the Last Year:   Transportation Needs:   . Freight forwarder (Medical):   Marland Kitchen Lack of Transportation (Non-Medical):   Physical Activity:   . Days of Exercise per Week:   . Minutes of Exercise per Session:   Stress:   . Feeling of Stress :   Social Connections:   . Frequency of Communication with Friends and Family:   . Frequency of Social Gatherings with Friends and Family:   . Attends Religious Services:   . Active Member of Clubs or Organizations:   . Attends Banker Meetings:   Marland Kitchen Marital  Status:    Additional Social History:                         Sleep: Good  Appetite:  Good  Current Medications: Current Facility-Administered Medications  Medication Dose Route Frequency Provider Last Rate Last Admin  . acetaminophen (TYLENOL) tablet 650 mg  650 mg Oral Q6H PRN Gillermo Murdoch, NP   650 mg at 04/04/20 0646  . alum & mag hydroxide-simeth (MAALOX/MYLANTA) 200-200-20 MG/5ML suspension 30 mL  30 mL Oral Q4H PRN Gillermo Murdoch, NP   30 mL at 04/02/20 1619  . docusate sodium (COLACE) capsule 100 mg  100 mg Oral BID [name], [name], MD   100 mg at 04/04/20 7619  . haloperidol (HALDOL) tablet 5 mg  5 mg Oral Q6H PRN [name], [name], MD   5 mg at 03/31/20 2125   Or  . haloperidol lactate (HALDOL) injection 5 mg  5 mg Intramuscular Q6H PRN [name], [name], MD      . hydrOXYzine (ATARAX/VISTARIL) tablet 25 mg  25 mg Oral Q6H PRN Gillermo Murdoch, NP   25 mg at 04/02/20 1929  . lithium carbonate (LITHOBID) CR tablet 300 mg  300 mg Oral Q12H Illeana Edick, Gerlene Burdock, [name]   300 mg at 04/04/20 1234  . magnesium hydroxide (MILK OF MAGNESIA) suspension 30 mL  30 mL Oral Daily PRN Gillermo Murdoch, NP      . OLANZapine zydis (ZYPREXA) disintegrating tablet 15 mg  15 mg Oral BID [name], Jackquline Denmark, MD   15 mg at 04/04/20 0806  . penicillin g benzathine (BICILLIN LA) 1200000 UNIT/2ML injection 2.4 Million Units  2.4 Million Units Intramuscular Weekly Ravishankar, Rhodia Albright, MD      . traZODone (DESYREL) tablet 50 mg  50 mg Oral QHS PRN Gillermo Murdoch, NP   50 mg at 04/02/20 2151    Lab Results: No results found for this or any previous visit (from the past 48 hour(s)).  Blood Alcohol level:  Lab Results  Component Value Date   ETH <10 03/25/2020    Metabolic Disorder Labs: No results found for: HGBA1C, MPG No results found for: PROLACTIN No results found for: CHOL, TRIG, HDL, CHOLHDL, VLDL, LDLCALC  Physical Findings: AIMS:  , ,  ,  ,    CIWA:    COWS:      Musculoskeletal: Strength & Muscle Tone: within normal limits Gait & Station: normal Patient leans: N/A  Psychiatric Specialty Exam: Physical Exam  Nursing note and vitals reviewed. Constitutional: He is oriented to  person, place, and time. He appears well-developed and well-nourished.  Cardiovascular: Normal rate.  Respiratory: Effort normal.  Musculoskeletal:        General: Normal range of motion.  Neurological: He is alert and oriented to person, place, and time.  Skin: Skin is warm.  Psychiatric: His speech is rapid and/or pressured. Thought content is delusional. Cognition and memory are impaired. He expresses impulsivity and inappropriate judgment.    Review of Systems  Constitutional: Negative.   HENT: Negative.   Eyes: Negative.   Respiratory: Negative.   Cardiovascular: Negative.   Gastrointestinal: Negative.   Genitourinary: Negative.   Musculoskeletal: Negative.   Skin: Negative.   Neurological: Negative.   Psychiatric/Behavioral: Negative.     Blood pressure 126/73, pulse 84, temperature 98.2 F (36.8 C), temperature source Oral, resp. rate 18, height 5\' 9"  (1.753 m), weight 74.8 kg, SpO2 99 %.Body mass index is 24.35 kg/m.  General Appearance: Casual  Eye Contact:  Fair  Speech:  Pressured  Volume:  Increased  Mood:  Euphoric  Affect:  Congruent and Inappropriate  Thought Process:  Disorganized, Goal Directed, Irrelevant and Descriptions of Associations: Tangential  Orientation:  Full (Time, Place, and Person)  Thought Content:  Illogical, Delusions, Ideas of Reference:   Paranoia Delusions, Paranoid Ideation, Rumination and Tangential  Suicidal Thoughts:  No  Homicidal Thoughts:  No  Memory:  Immediate;   Fair Recent;   Fair Remote;   Fair  Judgement:  Impaired  Insight:  Shallow  Psychomotor Activity:  Increased  Concentration:  Concentration: Fair  Recall:  of Knowledge:  Fair  Language:  Fair  Akathisia:  No  Handed:  Right   AIMS (if indicated):     Assets:  Desire for Improvement Housing Resilience Social Support  ADL's:  Intact  Cognition:  WNL  Sleep:  Number of Hours: 8   Assessment: Pleasant, calm, and cooperative during the evaluation.  However the patient has continued to be intrusive in conversation and is inappropriate with his comments as well as his actions.  Patient can be loud and disruptive.  Patient is constantly in and out of group and has been seen cutting lights off in the day room and before entering conversing.  Patient's room has remained trashed with food and papers laying on the windowsill and on the floor.  His bed is disheveled severely.  Patient has been tolerating his medications thus far.  After consulting with Dr. Fiserv feel that we should possibly start the patient on some lithium to assist with his manic episode.  The patient did deny having any suicidal homicidal ideations and denies any hallucinations.  However the patient continues to present with delusional and paranoid thoughts.  Multiple people have attempted to discuss with the patient his HIV and positive RPR results and the patient continues to dismiss it and refused to discuss it with anyone and his stating that he has a private doctor and refuses any medications at this time.  Treatment Plan Summary: Daily contact with patient to assess and evaluate symptoms and progress in treatment and Medication management Start lithium 300 mg p.o. every 12 hours for bipolar 1 disorder Continue Zyprexa Zydis 15 mg p.o. twice daily for bipolar 1 disorder Continue trazodone 50 mg p.o. nightly as needed for insomnia Continue Haldol 5 mg p.o. or IM every 6 hours as needed for agitation or psychosis Continue Vistaril 25 mg p.o. every 6 hours as needed for anxiety Encourage group therapy participation Continue every 15  minute safety  Maryfrances Bunnell, [name] 04/04/2020, 1:59 PM

## 2020-04-05 MED ORDER — OLANZAPINE 10 MG PO TBDP
20.0000 mg | ORAL_TABLET | Freq: Two times a day (BID) | ORAL | Status: DC
  Administered 2020-04-05 – 2020-04-08 (×7): 20 mg via ORAL
  Filled 2020-04-05: qty 2
  Filled 2020-04-05: qty 4
  Filled 2020-04-05: qty 2
  Filled 2020-04-05 (×2): qty 4
  Filled 2020-04-05 (×2): qty 2
  Filled 2020-04-05: qty 4
  Filled 2020-04-05 (×4): qty 2
  Filled 2020-04-05: qty 4

## 2020-04-05 MED ORDER — LITHIUM CARBONATE ER 300 MG PO TBCR
600.0000 mg | EXTENDED_RELEASE_TABLET | Freq: Two times a day (BID) | ORAL | Status: DC
  Administered 2020-04-05 – 2020-04-08 (×6): 600 mg via ORAL
  Filled 2020-04-05 (×6): qty 2

## 2020-04-05 NOTE — Plan of Care (Signed)
Pt presented with a better affect today than yesterday  Problem: Education: Goal: Emotional status will improve Outcome: Progressing Goal: Mental status will improve Outcome: Progressing

## 2020-04-05 NOTE — Progress Notes (Signed)
[facility] MD Progress Note  04/05/2020 11:39 AM Mark Galloway  MRN:  [id number] Subjective: Patient seen chart reviewed.  I tried to meet with the patient in my office today to talk about both his behavior and his mood and also to talk about his medical situation specifically the results of his HIV and syphilis tests.  Patient tells me that he is feeling fantastic.  He admits that he has been insulting to several other patients and says that is just his normal behavior.  We have been getting multiple complaints from other patients on the unit that he is disruptive and hostile.  I suggested to him that he consider changing his behavior but his conversation was so tangential and pressured it was difficult to have any back-and-forth discussion.  I then attempted to discuss his HIV tests and the results of his syphilis test.  Patient started out by telling me "my tests are really good".  I was trying to point out to him that that is not actually the case.  He has abnormal CD4 counts and elevated copies of HIV.  His lab tests are certainly not consistent with someone who has been on appropriate modern therapy.  Furthermore I wish to discuss with him that the results of his syphilis antibody indicate that he should have a lumbar puncture.  However I was unable to discuss any of this because as soon as I mentioned that I wanted to discuss his lab test the patient's demeanor changed to become even worse.  He began cursing me out and insulting me and my family and stormed out of the room.  Extremely irrational behavior. Principal Problem: Severe manic bipolar 1 disorder with psychotic behavior ([name]) Diagnosis: Principal Problem:   Severe manic bipolar 1 disorder with psychotic behavior ([name]) Active Problems:   Anxiety   Psychosis ([name])   HIV positive ([name])  Total Time spent with patient: 30 minutes  Past Psychiatric History: Not very clear except that the patient seems to have changed his behavior late last  year.  Past Medical History:  Past Medical History:  Diagnosis Date  . Shingles    History reviewed. No pertinent surgical history. Family History: History reviewed. No pertinent family history. Family Psychiatric  History: None known Social History:  Social History   Substance and Sexual Activity  Alcohol Use Yes   Comment: weekly     Social History   Substance and Sexual Activity  Drug Use Never    Social History   Socioeconomic History  . Marital status: Single    Spouse name: Not on file  . Number of children: Not on file  . Years of education: Not on file  . Highest education level: Not on file  Occupational History  . Not on file  Tobacco Use  . Smoking status: Current Some Day Smoker  . Smokeless tobacco: Never Used  Substance and Sexual Activity  . Alcohol use: Yes    Comment: weekly  . Drug use: Never  . Sexual activity: Not Currently  Other Topics Concern  . Not on file  Social History Narrative  . Not on file   Social Determinants of Health   Financial Resource Strain:   . Difficulty of Paying Living Expenses:   Food Insecurity:   . Worried About Programme researcher, broadcasting/film/video in the Last Year:   . Barista in the Last Year:   Transportation Needs:   . Freight forwarder (Medical):   Marland Kitchen Lack of Transportation (  Non-Medical):   Physical Activity:   . Days of Exercise per Week:   . Minutes of Exercise per Session:   Stress:   . Feeling of Stress :   Social Connections:   . Frequency of Communication with Friends and Family:   . Frequency of Social Gatherings with Friends and Family:   . Attends Religious Services:   . Active Member of Clubs or Organizations:   . Attends Banker Meetings:   Marland Kitchen Marital Status:    Additional Social History:                         Sleep: Poor  Appetite:  Fair  Current Medications: Current Facility-Administered Medications  Medication Dose Route Frequency Provider Last Rate Last  Admin  . acetaminophen (TYLENOL) tablet 650 mg  650 mg Oral Q6H PRN Gillermo Murdoch, NP   650 mg at 04/05/20 0406  . alum & mag hydroxide-simeth (MAALOX/MYLANTA) 200-200-20 MG/5ML suspension 30 mL  30 mL Oral Q4H PRN Gillermo Murdoch, NP   30 mL at 04/02/20 1619  . docusate sodium (COLACE) capsule 100 mg  100 mg Oral BID Daysy Santini T, MD   100 mg at 04/05/20 4174  . haloperidol (HALDOL) tablet 5 mg  5 mg Oral Q6H PRN Anijah Spohr T, MD   5 mg at 03/31/20 2125   Or  . haloperidol lactate (HALDOL) injection 5 mg  5 mg Intramuscular Q6H PRN Saidi Santacroce T, MD      . hydrOXYzine (ATARAX/VISTARIL) tablet 25 mg  25 mg Oral Q6H PRN Gillermo Murdoch, NP   25 mg at 04/04/20 2053  . lithium carbonate (LITHOBID) CR tablet 600 mg  600 mg Oral Q12H Aerica Rincon T, MD      . magnesium hydroxide (MILK OF MAGNESIA) suspension 30 mL  30 mL Oral Daily PRN Gillermo Murdoch, NP      . OLANZapine zydis (ZYPREXA) disintegrating tablet 20 mg  20 mg Oral BID Caydon Feasel T, MD      . penicillin g benzathine (BICILLIN LA) 1200000 UNIT/2ML injection 2.4 Million Units  2.4 Million Units Intramuscular Weekly Lynn Ito, MD   2.4 Million Units at 04/04/20 1538  . polyethylene glycol (MIRALAX / GLYCOLAX) packet 17 g  17 g Oral Daily PRN Money, Gerlene Burdock, [name]      . traZODone (DESYREL) tablet 50 mg  50 mg Oral QHS PRN Gillermo Murdoch, NP   50 mg at 04/04/20 2053    Lab Results: No results found for this or any previous visit (from the past 48 hour(s)).  Blood Alcohol level:  Lab Results  Component Value Date   ETH <10 03/25/2020    Metabolic Disorder Labs: No results found for: HGBA1C, MPG No results found for: PROLACTIN No results found for: CHOL, TRIG, HDL, CHOLHDL, VLDL, LDLCALC  Physical Findings: AIMS:  , ,  ,  ,    CIWA:    COWS:     Musculoskeletal: Strength & Muscle Tone: within normal limits Gait & Station: normal Patient leans: N/A  Psychiatric Specialty  Exam: Physical Exam  Nursing note and vitals reviewed. Constitutional: He appears well-developed and well-nourished.  HENT:  Head: Normocephalic and atraumatic.  Eyes: Pupils are equal, round, and reactive to light. Conjunctivae are normal.  Cardiovascular: Regular rhythm and normal heart sounds.  Respiratory: Effort normal.  GI: Soft.  Musculoskeletal:        General: Normal range of motion.  Cervical back: Normal range of motion.  Neurological: He is alert.  Skin: Skin is warm and dry.  Psychiatric: His affect is labile and inappropriate. His speech is rapid and/or pressured and tangential. He is agitated and aggressive. Thought content is paranoid and delusional. Cognition and memory are impaired. He expresses impulsivity and inappropriate judgment. He is inattentive.    Review of Systems  Constitutional: Negative.   HENT: Negative.   Eyes: Negative.   Respiratory: Negative.   Cardiovascular: Negative.   Gastrointestinal: Negative.   Musculoskeletal: Negative.   Skin: Negative.   Neurological: Negative.   Psychiatric/Behavioral: Negative.     Blood pressure 130/71, pulse (!) 102, temperature 98.8 F (37.1 C), temperature source Oral, resp. rate 18, height 5\' 9"  (1.753 m), weight 74.8 kg, SpO2 98 %.Body mass index is 24.35 kg/m.  General Appearance: Disheveled  Eye Contact:  Fair  Speech:  Garbled and Pressured  Volume:  Increased  Mood:  Angry, Euphoric and Irritable  Affect:  Inappropriate  Thought Process:  Disorganized  Orientation:  Full (Time, Place, and Person)  Thought Content:  Illogical, Delusions, Paranoid Ideation, Rumination and Tangential  Suicidal Thoughts:  No  Homicidal Thoughts:  No  Memory:  Immediate;   Fair Recent;   Poor Remote;   Poor  Judgement:  Impaired  Insight:  Lacking  Psychomotor Activity:  Restlessness  Concentration:  Concentration: Poor  Recall:  Poor  Fund of Knowledge:  Poor  Language:  Fair  Akathisia:  No  Handed:  Right   AIMS (if indicated):     Assets:  Communication Skills  ADL's:  Impaired  Cognition:  Impaired,  Mild  Sleep:  Number of Hours: 6.45     Treatment Plan Summary: Daily contact with patient to assess and evaluate symptoms and progress in treatment, Medication management and Plan Patient continues to display symptoms of mania and of impulse control problems that are beyond anything that would normally be acceptable and a personality.  His behavior is putting himself at obvious danger and that he is refusing to even have a conversation about his HIV disease.  Treatment team reviewed patient this morning and we will make an effort to keep him isolated from other patients while his behavior continues to be inappropriate.  Meanwhile I am increasing his olanzapine to 40 mg twice a day and also increasing his lithium to 600 mg twice a day.  I should mention that given the results of his HIV test and his syphilis test we cannot rule out that either of those diseases could be contributing to his psychiatric complaints however this is a very difficult thing to pursue when we cannot get him to cooperate for a lumbar puncture or an MRI or any other testing.  For now the best thing seems to be to treat this for mania while daily trying to get through to him with rational arguments.  Alethia Berthold, MD 04/05/2020, 11:39 AM

## 2020-04-05 NOTE — Progress Notes (Addendum)
   04/05/20 1400  Clinical Encounter Type  Visited With Patient;Other (Comment)  Visit Type Spiritual support;Social support;Behavioral Health  Referral From Chaplain  Consult/Referral To Chaplain  Chaplain encountered Patient in the hall before starting her group. Patient engaged Chaplain in conversation and was willing to talk. Patient participated in group on "What's in a Smile?" He participated having a lot of input. The group was held outside in the courtyard. Abhinav feels as though a smiling is inviting and shows that one is joyful. During the group Tjay mentioned that his fraternal grandfather died last week. While talking he walked away from group with tears in his eyes, saying just thinking about my granddad. After the group was over, Hardin talked about his relationship with his mother and what brought him to [facility]. At first Mattie complained about his mother not allowing him back into the house that he brought her. Chaplain asked whose house was it and after about three tries, he admitted that it was his mother's house, that he brought. After talking for a while, he admitted that his mouth gets him in trouble. He said that he wasn't going to change what he said, but might need to change the delivery. Chaplain asked him if he thought that he owed his mother and apology for the way he talked to her and for how he acted. Aside from being disrespectful, he admitted to pouring a bottle of spite down his mother's stairs. He talked about his father, who is a retired Land, Scientist, physiological. Patient spoke about having a good relationship with his stepmother in the beginning, but that has changed. He also talked about him and his dad being hommies and than his dad wanted to change and be a dad. Zorawar has a problem with that change. Chaplain said that she would be back to see him tomorrow.

## 2020-04-05 NOTE — [facility] Group Notes (Signed)
[facility] Group Notes:  (Nursing/MHT/Case Management/Adjunct)  Date:  04/05/2020  Time:  1:42 PM  Type of Therapy:  Psychoeducational Skills  Participation Level:  Active  Participation Quality:  Monopolizing  Affect:  Appropriate  Cognitive:  Appropriate  Insight:  Appropriate  Engagement in Group:  Monopolizing  Modes of Intervention:  Activity, Discussion, Education and Socialization  Summary of Progress/Problems:  Mark Galloway 04/05/2020, 1:42 PM

## 2020-04-05 NOTE — Tx Team (Signed)
Interdisciplinary Treatment and Diagnostic Plan Update  04/05/2020 Time of Session: 8:30AM Mark Galloway MRN: [id number]  Principal Diagnosis: Severe manic bipolar 1 disorder with psychotic behavior ([name])  Secondary Diagnoses: Principal Problem:   Severe manic bipolar 1 disorder with psychotic behavior ([place]) Active Problems:   Anxiety   Psychosis ([name])   HIV positive ([name])   Current Medications:  Current Facility-Administered Medications  Medication Dose Route Frequency Provider Last Rate Last Admin  . acetaminophen (TYLENOL) tablet 650 mg  650 mg Oral Q6H PRN Caroline Sauger, NP   650 mg at 04/05/20 1224  . alum & mag hydroxide-simeth (MAALOX/MYLANTA) 200-200-20 MG/5ML suspension 30 mL  30 mL Oral Q4H PRN Caroline Sauger, NP   30 mL at 04/02/20 1619  . docusate sodium (COLACE) capsule 100 mg  100 mg Oral BID [name], [name], MD   100 mg at 04/05/20 3875  . haloperidol (HALDOL) tablet 5 mg  5 mg Oral Q6H PRN [name], [name], MD   5 mg at 03/31/20 2125   Or  . haloperidol lactate (HALDOL) injection 5 mg  5 mg Intramuscular Q6H PRN [name], [name], MD      . hydrOXYzine (ATARAX/VISTARIL) tablet 25 mg  25 mg Oral Q6H PRN Caroline Sauger, NP   25 mg at 04/04/20 2053  . lithium carbonate (LITHOBID) CR tablet 600 mg  600 mg Oral Q12H [name], [name], MD      . magnesium hydroxide (MILK OF MAGNESIA) suspension 30 mL  30 mL Oral Daily PRN Caroline Sauger, NP      . OLANZapine zydis (ZYPREXA) disintegrating tablet 20 mg  20 mg Oral BID [name], [name], MD      . penicillin g benzathine (BICILLIN LA) 1200000 UNIT/2ML injection 2.4 Million Units  2.4 Million Units Intramuscular Weekly [name], MD   2.4 Million Units at 04/04/20 1538  . polyethylene glycol (MIRALAX / GLYCOLAX) packet 17 g  17 g Oral Daily PRN Money, [name], [name]      . traZODone (DESYREL) tablet 50 mg  50 mg Oral QHS PRN Caroline Sauger, NP   50 mg at 04/04/20 2053   PTA  Medications: Medications Prior to Admission  Medication Sig Dispense Refill Last Dose  . cephALEXin (KEFLEX) 500 MG capsule Take 1 capsule (500 mg total) by mouth every 6 (six) hours. 10 capsule 0   . elvitegravir-cobicistat-emtricitabine-tenofovir (GENVOYA) 150-150-200-10 MG TABS tablet Take 1 tablet by mouth daily with breakfast. (Patient not taking: Reported on 03/25/2020) 30 tablet 0   . gabapentin (NEURONTIN) 100 MG capsule Take 1 capsule (100 mg total) by mouth 3 (three) times daily for 7 days. 21 capsule 0   . OLANZapine zydis (ZYPREXA) 10 MG disintegrating tablet Take 1 tablet (10 mg total) by mouth at bedtime. 30 tablet 0     Patient Stressors: Marital or family conflict Medication change or noncompliance  Patient Strengths: Ability for insight Motivation for treatment/growth  Treatment Modalities: Medication Management, Group therapy, Case management,  1 to 1 session with clinician, Psychoeducation, Recreational therapy.   Physician Treatment Plan for Primary Diagnosis: Severe manic bipolar 1 disorder with psychotic behavior ([name]) Long Term Goal(s): Improvement in symptoms so as ready for discharge Improvement in symptoms so as ready for discharge   Short Term Goals: Ability to demonstrate self-control will improve Ability to identify and develop effective coping behaviors will improve Ability to maintain clinical measurements within normal limits will improve Compliance with prescribed medications will improve  Medication Management: Evaluate patient's  response, side effects, and tolerance of medication regimen.  Therapeutic Interventions: 1 to 1 sessions, Unit Group sessions and Medication administration.  Evaluation of Outcomes: Progressing  Physician Treatment Plan for Secondary Diagnosis: Principal Problem:   Severe manic bipolar 1 disorder with psychotic behavior ([name]) Active Problems:   Anxiety   Psychosis ([name])   HIV positive ([name])  Long Term Goal(s):  Improvement in symptoms so as ready for discharge Improvement in symptoms so as ready for discharge   Short Term Goals: Ability to demonstrate self-control will improve Ability to identify and develop effective coping behaviors will improve Ability to maintain clinical measurements within normal limits will improve Compliance with prescribed medications will improve     Medication Management: Evaluate patient's response, side effects, and tolerance of medication regimen.  Therapeutic Interventions: 1 to 1 sessions, Unit Group sessions and Medication administration.  Evaluation of Outcomes: Progressing   RN Treatment Plan for Primary Diagnosis: Severe manic bipolar 1 disorder with psychotic behavior ([name]) Long Term Goal(s): Knowledge of disease and therapeutic regimen to maintain health will improve  Short Term Goals: Ability to demonstrate self-control, Ability to participate in decision making will improve, Ability to verbalize feelings will improve, Ability to disclose and discuss suicidal ideas, Ability to identify and develop effective coping behaviors will improve and Compliance with prescribed medications will improve  Medication Management: RN will administer medications as ordered by provider, will assess and evaluate patient's response and provide education to patient for prescribed medication. RN will report any adverse and/or side effects to prescribing provider.  Therapeutic Interventions: 1 on 1 counseling sessions, Psychoeducation, Medication administration, Evaluate responses to treatment, Monitor vital signs and CBGs as ordered, Perform/monitor CIWA, COWS, AIMS and Fall Risk screenings as ordered, Perform wound care treatments as ordered.  Evaluation of Outcomes: Progressing   LCSW Treatment Plan for Primary Diagnosis: Severe manic bipolar 1 disorder with psychotic behavior ([name]) Long Term Goal(s): Safe transition to appropriate next level of care at discharge, Engage  patient in therapeutic group addressing interpersonal concerns.  Short Term Goals: Engage patient in aftercare planning with referrals and resources, Increase social support, Increase ability to appropriately verbalize feelings, Increase emotional regulation, Facilitate acceptance of mental health diagnosis and concerns and Increase skills for wellness and recovery  Therapeutic Interventions: Assess for all discharge needs, 1 to 1 time with Social worker, Explore available resources and support systems, Assess for adequacy in community support network, Educate family and significant other(s) on suicide prevention, Complete Psychosocial Assessment, Interpersonal group therapy.  Evaluation of Outcomes: Progressing   Progress in Treatment: Attending groups: Yes. Participating in groups: Yes. Taking medication as prescribed: Yes. Toleration medication: Yes. Family/Significant other contact made: No, will contact:  pt declined. Patient understands diagnosis: Yes. Discussing patient identified problems/goals with staff: Yes. Medical problems stabilized or resolved: No. Denies suicidal/homicidal ideation: Yes. Issues/concerns per patient self-inventory: No. Other: none  New problem(s) identified: No, Describe:  none  New Short Term/Long Term Goal(s): elimination of symptoms of psychosis, medication management for mood stabilization; elimination of SI thoughts; development of comprehensive mental wellness plan.  Patient Goals:  "working on my organization"  Discharge Plan or Barriers:  Patient has declined aftercare at this time.  CSW will continue to assist the patient with aftercare plans should he change his mind.   Reason for Continuation of Hospitalization: Medication stabilization  Estimated Length of Stay: 1-7 days  Recreational Therapy: Patient Stressors: N/A Patient Goal: Patient will engage in groups without prompting or encouragement from LRT x3 group  sessions within 5  recreation therapy group sessions  Attendees: Patient: 04/05/2020 3:13 PM  Physician: Dr. Toni Amend, MD 04/05/2020 3:13 PM  Nursing: Torrie Mayers, RN, Demetria Ravenell RN 04/05/2020 3:13 PM  RN Care Manager: 04/05/2020 3:13 PM  Social Worker: Iris Pert, LCSW 04/05/2020 3:13 PM  Recreational Therapist:  04/05/2020 3:13 PM  Other: Lowella Dandy LCSW 04/05/2020 3:13 PM  Other:  04/05/2020 3:13 PM  Other: 04/05/2020 3:13 PM    Scribe for Treatment Team: Charlann Lange Zaneta Lightcap, LCSW 04/05/2020 3:13 PM

## 2020-04-05 NOTE — Progress Notes (Signed)
Patient was in his room upon arrival to the unit. Patient pleasant during assessment denying SI/HI/AVH, anxiety and depression with this Clinical research associate. Pt endorses pain from shots he received yesterday (see MAR). Pt given education, support and encouragement. Patient being monitored Q 15 minutes for safety per unit protocol. Patient remains safe on the unit. Pt presents with better affect today than yesterday, isolative to his room but did get up for snack and medication administration which he was compliant with.

## 2020-04-05 NOTE — Progress Notes (Signed)
Patient stated to this writer that this is a mental health facility, so he does not want to talk about anything other than mental health.

## 2020-04-05 NOTE — Progress Notes (Signed)
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

## 2020-04-05 NOTE — [facility] Group Notes (Signed)
Overcoming Obstacles  04/05/2020 1PM  Type of Therapy and Topic:  Group Therapy:  Overcoming Obstacles  Participation Level:  Active    Description of Group:    In this group patients will be encouraged to explore what they see as obstacles to their own wellness and recovery. They will be guided to discuss their thoughts, feelings, and behaviors related to these obstacles. The group will process together ways to cope with barriers, with attention given to specific choices patients can make. Each patient will be challenged to identify changes they are motivated to make in order to overcome their obstacles. This group will be process-oriented, with patients participating in exploration of their own experiences as well as giving and receiving support and challenge from other group members.   Therapeutic Goals: 1. Patient will identify personal and current obstacles as they relate to admission. 2. Patient will identify barriers that currently interfere with their wellness or overcoming obstacles.  3. Patient will identify feelings, thought process and behaviors related to these barriers. 4. Patient will identify two changes they are willing to make to overcome these obstacles:      Summary of Patient Progress Actively and appropriately engaged in the group. Patient was able to provide support and validation to other group members. Patient identified lack of patience and relationship with family as obstacles he is working to overcome. Patient demonstrated insight and respected boundaries during session.    Therapeutic Modalities:   Cognitive Behavioral Therapy Solution Focused Therapy Motivational Interviewing Relapse Prevention Therapy    Lowella Dandy, MSW, LCSW 04/05/2020 2:43 PM

## 2020-04-05 NOTE — Progress Notes (Signed)
Recreation Therapy Notes   Date: 04/05/2020  Time: 9:30 am   Location: Craft room   Behavioral response: N/A   Intervention Topic: Leisure    Discussion/Intervention: Patient did not attend group.   Clinical Observations/Feedback:  Patient did not attend group.   Caroly Purewal LRT/CTRS        Diavian Furgason 04/05/2020 12:08 PM

## 2020-04-05 NOTE — Plan of Care (Signed)
D- Patient alert and oriented. Patient presents in a pleasant mood on assessment stating that he slept good last night and had no complaints to voice to this Clinical research associate. Patient denies SI, HI, AVH, and pain at this time. Patient also denies any signs/symptoms of depression/anxiety. Patient's goal for today is "organization", in which he will "think analytically", in order to achieve his goal.   A- Scheduled medications administered to patient, per MD orders. Support and encouragement provided.  Routine safety checks conducted every 15 minutes.  Patient informed to notify staff with problems or concerns.  R- No adverse drug reactions noted. Patient contracts for safety at this time. Patient compliant with medications and treatment plan. Patient receptive, calm, and cooperative. Patient interacts well with others on the unit.  Patient remains safe at this time.  Problem: Education: Goal: Knowledge of [place] [facility] Education information/materials will improve Outcome: Progressing Goal: Emotional status will improve Outcome: Progressing Goal: Mental status will improve Outcome: Progressing Goal: Verbalization of understanding the information provided will improve Outcome: Progressing   Problem: Safety: Goal: Periods of time without injury will increase Outcome: Progressing   Problem: Activity: Goal: Will verbalize the importance of balancing activity with adequate rest periods Outcome: Progressing   Problem: Safety: Goal: Ability to redirect hostility and anger into socially appropriate behaviors will improve Outcome: Progressing Goal: Ability to remain free from injury will improve Outcome: Progressing

## 2020-04-06 MED ORDER — WHITE PETROLATUM EX OINT
TOPICAL_OINTMENT | CUTANEOUS | Status: AC
  Filled 2020-04-06: qty 5

## 2020-04-06 NOTE — Progress Notes (Signed)
Patient had an explosive episode this am, angered, states spoke to MD and did not speak back. Patient verbally aggressive as well as threw tea on nurses station window. Patient then went to room. Continues to be in denial of medical condition. When staff approaches with it, patient immediately becomes defensive and does not want to talk about it. Patient complained of pain to bilateral knees. Tylenol given with good relief.  Encouragement and support offered. Safety checks maintained. Medications given as prescribed. Pt receptive and remains safe on unit with q 15 min checks.

## 2020-04-06 NOTE — Progress Notes (Signed)
[facility] MD Progress Note  04/06/2020 1:41 PM Mark Galloway  MRN:  [id number]   Subjective: Follow-up for this 34 year old male diagnosed with bipolar 1 disorder with psychotic behavior.  Patient reports today that he is great.  He states he does not have any issues today and he feels like he is doing fine.  Patient reports he has no side effects from medications and denies having any suicidal or homicidal ideations and denies any hallucinations.  Patient states that he feels that he needs to be discharged home.  Patient was reassessed about the potential of a medical issue causing a mental health issue and immediately he stated "were not talking about this anymore.  I am tired of her body pushing this only.  You are dismissed."  Patient walked away from me and down the hall I attempted to reengage the patient and he continued to walk away and walked around the nurses station and then back to his room and refused to speak to me anymore.  Principal Problem: Severe manic bipolar 1 disorder with psychotic behavior ([name]) Diagnosis: Principal Problem:   Severe manic bipolar 1 disorder with psychotic behavior ([name]) Active Problems:   Anxiety   Psychosis ([name])   HIV positive ([name])  Total Time spent with patient: 20 minutes  Past Psychiatric History: Little information available.  The patient claims he has never seen any kind of mental health provider in the past and claims to have never been prescribed any psychiatric medicine.  Notes report that he had said in [place] that he had a therapist named "Amber Rose".  When I asked him about that today he came up with some odd tangential excuse for it.  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  I do not know if that is true or what other follow-up there is to that.  Past Medical History:  Past Medical History:  Diagnosis Date  .  Shingles    History reviewed. No pertinent surgical history. Family History: History reviewed. No pertinent family history. Family Psychiatric  History: The patient tells me he thinks that people in his family have "schizo bipolar disorder".  I asked him how he knew that he said that they just act funny. Social History:  Social History   Substance and Sexual Activity  Alcohol Use Yes   Comment: weekly     Social History   Substance and Sexual Activity  Drug Use Never    Social History   Socioeconomic History  . Marital status: Single    Spouse name: Not on file  . Number of children: Not on file  . Years of education: Not on file  . Highest education level: Not on file  Occupational History  . Not on file  Tobacco Use  . Smoking status: Current Some Day Smoker  . Smokeless tobacco: Never Used  Substance and Sexual Activity  . Alcohol use: Yes    Comment: weekly  . Drug use: Never  . Sexual activity: Not Currently  Other Topics Concern  . Not on file  Social History Narrative  . Not on file   Social Determinants of Health   Financial Resource Strain:   . Difficulty of Paying Living Expenses:   Food Insecurity:   . Worried About Programme researcher, broadcasting/film/video in the Last Year:   . [employer] of  Food in the Last Year:   Transportation Needs:   . Freight forwarder (Medical):   Marland Kitchen Lack of Transportation (Non-Medical):   Physical Activity:   . Days of Exercise per Week:   . Minutes of Exercise per Session:   Stress:   . Feeling of Stress :   Social Connections:   . Frequency of Communication with Friends and Family:   . Frequency of Social Gatherings with Friends and Family:   . Attends Religious Services:   . Active Member of Clubs or Organizations:   . Attends Banker Meetings:   Marland Kitchen Marital Status:    Additional Social History:                         Sleep: Fair  Appetite:  Fair  Current Medications: Current Facility-Administered  Medications  Medication Dose Route Frequency Provider Last Rate Last Admin  . acetaminophen (TYLENOL) tablet 650 mg  650 mg Oral Q6H PRN Gillermo Murdoch, NP   650 mg at 04/06/20 1223  . alum & mag hydroxide-simeth (MAALOX/MYLANTA) 200-200-20 MG/5ML suspension 30 mL  30 mL Oral Q4H PRN Gillermo Murdoch, NP   30 mL at 04/02/20 1619  . docusate sodium (COLACE) capsule 100 mg  100 mg Oral BID [name], [name], MD   100 mg at 04/06/20 0748  . haloperidol (HALDOL) tablet 5 mg  5 mg Oral Q6H PRN [name], [name], MD   5 mg at 03/31/20 2125   Or  . haloperidol lactate (HALDOL) injection 5 mg  5 mg Intramuscular Q6H PRN [name], [name], MD      . hydrOXYzine (ATARAX/VISTARIL) tablet 25 mg  25 mg Oral Q6H PRN Gillermo Murdoch, NP   25 mg at 04/05/20 2123  . lithium carbonate (LITHOBID) CR tablet 600 mg  600 mg Oral Q12H [name], Jackquline Denmark, MD   600 mg at 04/06/20 0748  . magnesium hydroxide (MILK OF MAGNESIA) suspension 30 mL  30 mL Oral Daily PRN Gillermo Murdoch, NP      . OLANZapine zydis (ZYPREXA) disintegrating tablet 20 mg  20 mg Oral BID [name], Jackquline Denmark, MD   20 mg at 04/06/20 0749  . penicillin g benzathine (BICILLIN LA) 1200000 UNIT/2ML injection 2.4 Million Units  2.4 Million Units Intramuscular Weekly Lynn Ito, MD   2.4 Million Units at 04/04/20 1538  . polyethylene glycol (MIRALAX / GLYCOLAX) packet 17 g  17 g Oral Daily PRN Demoni Gergen, Gerlene Burdock, [name]      . traZODone (DESYREL) tablet 50 mg  50 mg Oral QHS PRN Gillermo Murdoch, NP   50 mg at 04/05/20 2123    Lab Results: No results found for this or any previous visit (from the past 48 hour(s)).  Blood Alcohol level:  Lab Results  Component Value Date   ETH <10 03/25/2020    Metabolic Disorder Labs: No results found for: HGBA1C, MPG No results found for: PROLACTIN No results found for: CHOL, TRIG, HDL, CHOLHDL, VLDL, LDLCALC  Physical Findings: AIMS:  , ,  ,  ,    CIWA:    COWS:      Musculoskeletal: Strength & Muscle Tone: within normal limits Gait & Station: normal Patient leans: N/A  Psychiatric Specialty Exam: Physical Exam  Nursing note and vitals reviewed. Constitutional: He is oriented to person, place, and time. He appears well-developed and well-nourished.  Respiratory: Effort normal.  Musculoskeletal:        General: Normal range of  motion.  Neurological: He is alert and oriented to person, place, and time.  Skin: Skin is warm.  Psychiatric: His speech is rapid and/or pressured and tangential. He is agitated and hyperactive. He expresses impulsivity and inappropriate judgment.    Review of Systems  Constitutional: Negative.   HENT: Negative.   Eyes: Negative.   Respiratory: Negative.   Cardiovascular: Negative.   Gastrointestinal: Negative.   Genitourinary: Negative.   Musculoskeletal: Negative.   Skin: Negative.   Neurological: Negative.   Psychiatric/Behavioral: Negative.     Blood pressure 108/73, pulse (!) 113, temperature 98.6 F (37 C), temperature source Oral, resp. rate 17, height 5\' 9"  (1.753 m), weight 74.8 kg, SpO2 100 %.Body mass index is 24.35 kg/m.  General Appearance: Disheveled  Eye Contact:  Fair  Speech:  Pressured  Volume:  Increased  Mood:  Angry, Euphoric and Irritable  Affect:  Inappropriate  Thought Process:  Disorganized and Descriptions of Associations: Tangential  Orientation:  Full (Time, Place, and Person)  Thought Content:  Illogical, Delusions, Paranoid Ideation, Rumination and Tangential  Suicidal Thoughts:  No  Homicidal Thoughts:  No  Memory:  Immediate;   Fair Recent;   Fair Remote;   Fair  Judgement:  Impaired  Insight:  Lacking  Psychomotor Activity:  Restlessness  Concentration:  Concentration: Poor  Recall:  Poor  Fund of Knowledge:  Poor  Language:  Fair  Akathisia:  No  Handed:  Right  AIMS (if indicated):     Assets:  Communication Skills  ADL's:  Impaired  Cognition:  Impaired,  Mild   Sleep:  Number of Hours: 8.45   Assessment: Patient presents in his room and is standing at the window looking outside.  At first patient is pleasant and states that he is feeling great today.  However most of the conversation turned to trying to discuss the patient's diagnosis of syphilis and HIV the patient gets upset and angry man walks out of the room.  He is continued to do this with everyone who was attempted to discuss this with him.  Feel the patient will need to stabilize so we can have logical conversation about his treatment options so that he can make a sound judgment about his treatment.  Patient has continued to be intrusive, loud, disruptive, condescending to others, etc. with continued manic symptoms.  Treatment Plan Summary: Daily contact with patient to assess and evaluate symptoms and progress in treatment and Medication management Continue Lithobid 600 mg p.o. every 12 hours for manic episode Continue Zyprexa Zydis 20 mg p.o. twice daily bipolar 1 disorder Continue penicillin G 2,400,000 units IM weekly for syphilis Continue trazodone 50 mg p.o. nightly as needed for insomnia Continue Vistaril 25 mg p.o. every 6 hours as needed for anxiety Continue Haldol 5 mg p.o. or IM every 6 hours as needed for agitation Continue every 15 minute safety checks Encourage group therapy participation  Lewis Shock, [name] 04/06/2020, 1:41 PM

## 2020-04-06 NOTE — Plan of Care (Signed)
  Problem: Education: Goal: Knowledge of [place] [facility] Education information/materials will improve Outcome: Progressing Goal: Emotional status will improve Outcome: Not Progressing Goal: Mental status will improve Outcome: Not Progressing Goal: Verbalization of understanding the information provided will improve Outcome: Not Progressing   Problem: Activity: Goal: Will verbalize the importance of balancing activity with adequate rest periods Outcome: Progressing   Problem: Safety: Goal: Ability to redirect hostility and anger into socially appropriate behaviors will improve Outcome: Not Progressing Goal: Ability to remain free from injury will improve Outcome: Progressing

## 2020-04-06 NOTE — [facility] Group Notes (Signed)
LCSW Group Therapy Note  04/06/2020 1:00 PM  Type of Therapy/Topic:  Group Therapy:  Emotion Regulation  Participation Level:  Did Not Attend   Description of Group:   The purpose of this group is to assist patients in learning to regulate negative emotions and experience positive emotions. Patients will be guided to discuss ways in which they have been vulnerable to their negative emotions. These vulnerabilities will be juxtaposed with experiences of positive emotions or situations, and patients will be challenged to use positive emotions to combat negative ones. Special emphasis will be placed on coping with negative emotions in conflict situations, and patients will process healthy conflict resolution skills.  Therapeutic Goals: 1. Patient will identify two positive emotions or experiences to reflect on in order to balance out negative emotions 2. Patient will label two or more emotions that they find the most difficult to experience 3. Patient will demonstrate positive conflict resolution skills through discussion and/or role plays  Summary of Patient Progress: Patient walked into group, however, left before group began.   Therapeutic Modalities:   Cognitive Behavioral Therapy Feelings Identification Dialectical Behavioral Therapy  Penni Homans, MSW, LCSW 04/06/2020 2:44 PM

## 2020-04-06 NOTE — Progress Notes (Signed)
   04/06/20 1445  Clinical Encounter Type  Visited With Patient  Visit Type Follow-up;Spiritual support;Social support;Behavioral Health  Referral From Chaplain  Consult/Referral To Chaplain  While rounding Chaplain visited with patient. Patient said he has problems with male authority figures. He believes this to be the case because his father was not a positive authority figure in his life. Patient admitted that he has problems with all authority and doesn't like people to telling him what to do. Patient mentioned his father buying his younger sister an Duffy Bruce and than said he never brought me a car. Patient said that he has a positive outlook, but did say he is not getting a lot from his stay at [facility]. Patient said that he is brilliant and has two degrees one in business and the other in marketing. Patient is still somewhat upset with his mother for bringing him to [facility]. Chaplain offered pastoral presence and empathy and told patient she would see him tomorrow.

## 2020-04-06 NOTE — Progress Notes (Signed)
Patient ID: Mark Galloway, male   DOB: 1986/09/15, 34 y.o.   MRN: [id number] At her request, I attempted to reach the patient's mother by telephone this afternoon.  Phone rang until eventually a message said that the party was not available.  There was no opportunity to leave a voicemail message.  I will continue to try to reach her.

## 2020-04-06 NOTE — Progress Notes (Signed)
Recreation Therapy Notes   Date: 04/06/2020  Time: 9:30 am  Location: Craft Room  Behavioral response: Appropriate   Intervention Topic: Goals   Discussion/Intervention:  Group content on today was focused on goals. Patients described what goals are and how they define goals. Individuals expressed how they go about setting goals and reaching them. The group identified how important goals are and if they make short term goals to reach long term goals. Patients described how many goals they work on at a time and what affects them not reaching their goal. Individuals described how much time they put into planning and obtaining their goals. The group participated in the intervention "My Goal Board" and made personal goal boards to help them achieve their goal. Clinical Observations/Feedback:  Patient came to group and defined goals as specific and measurable things you aspire to do. He explained that his goal is to be better organized. Individual was social with peers and staff while participating in the intervention during group.  Kyli Sorter LRT/CTRS         Ulisses Vondrak 04/06/2020 11:23 AM

## 2020-04-07 MED ORDER — WHITE PETROLATUM EX OINT
TOPICAL_OINTMENT | CUTANEOUS | Status: AC
  Filled 2020-04-07: qty 5

## 2020-04-07 MED ORDER — ASPIRIN-ACETAMINOPHEN-CAFFEINE 250-250-65 MG PO TABS
2.0000 | ORAL_TABLET | Freq: Three times a day (TID) | ORAL | Status: DC | PRN
  Administered 2020-04-07: 16:00:00 2 via ORAL
  Filled 2020-04-07 (×2): qty 2

## 2020-04-07 NOTE — Progress Notes (Signed)
F - Stabilize mood and improve insight/judgment.  D - The patient ate breakfast, minimally participated in groups, and spent the majority of the day in his room.  He was pleasant upon contact and endorsed an intermittent headache.  Mark Galloway denied thoughts of harming himself and others.  He was behaviorally appropriate and did not display any unsafe behaviors.  Medications were accepted without issue.  Trip showered and ate adequate food.  No significant issues to note.  A - Support and medications provided.  Mark Galloway continues to deny the need for medications.  He easily becomes agitated when discussing medical concerns.  Attempts to educated were met with resistance.   PRN medications ordered to help manage headaches.  R - Continue with current treatments and attempt to improve insight/judgment.

## 2020-04-07 NOTE — Plan of Care (Signed)
  Problem: Coping Skills Goal: STG - Patient will identify 3 positive coping skills strategies to use post d/c within 5 recreation therapy group sessions Description: STG - Patient will identify 3 positive coping skills strategies to use post d/c within 5 recreation therapy group sessions Outcome: Progressing   

## 2020-04-07 NOTE — Plan of Care (Signed)
The patient appeared depressed with a flat affect.  Problem: Education: Goal: Emotional status will improve Outcome: Progressing Goal: Mental status will improve Outcome: Progressing

## 2020-04-07 NOTE — Progress Notes (Signed)
Recreation Therapy Notes   Date: 04/07/2020  Time: 9:30 am   Location: Outside    Behavioral response: N/A   Intervention Topic: Animal Assisted therapy    Discussion/Intervention: Patient did not attend group.   Clinical Observations/Feedback:  Patient did not attend group.   Curran Lenderman LRT/CTRS        Cambren Helm 04/07/2020 10:25 AM

## 2020-04-07 NOTE — Plan of Care (Signed)
  Problem: Education: Goal: Knowledge of [place] [facility] Education information/materials will improve Outcome: Progressing Goal: Emotional status will improve Outcome: Progressing Goal: Mental status will improve Outcome: Progressing Goal: Verbalization of understanding the information provided will improve Outcome: Progressing  D: Patient has been isolative to room. Lying in bed in the dark. Denies SI, HI and AVH. Mood is depressed. Affect is sad, but, when asked about his earlier episode he smiled and said "yeah, we made a scene" and seemed pleased with himself.  A: Continue to monitor for safety R: Safety maintained.

## 2020-04-07 NOTE — Plan of Care (Signed)
Patient presents with an improving affect and was observed interacting appropriately with staff and peers on the unit.   Problem: Education: Goal: Emotional status will improve Outcome: Progressing Goal: Mental status will improve Outcome: Progressing

## 2020-04-07 NOTE — Progress Notes (Signed)
Patient was in the day room upon arrival to the unit. Patient pleasant during assessment denying SI/HI/AVH, anxiety and depression with this Clinical research associate. Pt given education, support and encouragement. Patient being monitored Q 15 minutes for safety per unit protocol. Patient remains safe on the unit.Pt observed interacting appropriately with staff and peers on the unit.

## 2020-04-07 NOTE — [facility] Group Notes (Signed)
[facility] Group Notes:  (Nursing/MHT/Case Management/Adjunct)  Date:  04/07/2020  Time:  8:49 AM  Type of Therapy:  Community Meeting  Participation Level:  Minimal  Participation Quality:  Appropriate  Affect:  Appropriate  Cognitive:  Appropriate  Insight:  Appropriate  Engagement in Group:  Engaged  Modes of Intervention:  Discussion, Education and Support  Summary of Progress/Problems:  Mark Galloway Mark Galloway 04/07/2020, 8:49 AM

## 2020-04-07 NOTE — Progress Notes (Signed)
[facility] MD Progress Note  04/07/2020 4:20 PM Mark Galloway  MRN:  [id number] Subjective: Patient seen and chart reviewed.  34 year old man with acute psychosis continues to display agitated disorganized behavior.  Patient had no specific complaints today.  Once again I tried to discuss with him his serious medical issues specifically that he has untreated HIV disease and untreated syphilis that needs a proper work-up.  Patient once again refused to even allow me to speak to him about it.  As soon as I broached the subject he began speaking loudly and dismissively and walked out of the room as quickly as he could.  Patient continues to show grandiose thinking and behavior with intrusive behavior and inappropriate behavior on the unit Principal Problem: Severe manic bipolar 1 disorder with psychotic behavior ([name]) Diagnosis: Principal Problem:   Severe manic bipolar 1 disorder with psychotic behavior ([name]) Active Problems:   Anxiety   Psychosis ([name])   HIV positive ([name])  Total Time spent with patient: 30 minutes  Past Psychiatric History: Unknown other than that the current episode appears to have been going on for a few months.  Past Medical History:  Past Medical History:  Diagnosis Date  . Shingles    History reviewed. No pertinent surgical history. Family History: History reviewed. No pertinent family history. Family Psychiatric  History: None known Social History:  Social History   Substance and Sexual Activity  Alcohol Use Yes   Comment: weekly     Social History   Substance and Sexual Activity  Drug Use Never    Social History   Socioeconomic History  . Marital status: Single    Spouse name: Not on file  . Number of children: Not on file  . Years of education: Not on file  . Highest education level: Not on file  Occupational History  . Not on file  Tobacco Use  . Smoking status: Current Some Day Smoker  . Smokeless tobacco: Never Used  Substance and Sexual Activity   . Alcohol use: Yes    Comment: weekly  . Drug use: Never  . Sexual activity: Not Currently  Other Topics Concern  . Not on file  Social History Narrative  . Not on file   Social Determinants of Health   Financial Resource Strain:   . Difficulty of Paying Living Expenses:   Food Insecurity:   . Worried About Programme researcher, broadcasting/film/video in the Last Year:   . Barista in the Last Year:   Transportation Needs:   . Freight forwarder (Medical):   Marland Kitchen Lack of Transportation (Non-Medical):   Physical Activity:   . Days of Exercise per Week:   . Minutes of Exercise per Session:   Stress:   . Feeling of Stress :   Social Connections:   . Frequency of Communication with Friends and Family:   . Frequency of Social Gatherings with Friends and Family:   . Attends Religious Services:   . Active Member of Clubs or Organizations:   . Attends Banker Meetings:   Marland Kitchen Marital Status:    Additional Social History:                         Sleep: Fair  Appetite:  Fair  Current Medications: Current Facility-Administered Medications  Medication Dose Route Frequency Provider Last Rate Last Admin  . alum & mag hydroxide-simeth (MAALOX/MYLANTA) 200-200-20 MG/5ML suspension 30 mL  30 mL Oral Q4H PRN Janee Morn,  Adela Lank, NP   30 mL at 04/02/20 1619  . aspirin-acetaminophen-caffeine (EXCEDRIN MIGRAINE) per tablet 2 tablet  2 tablet Oral Q8H PRN Money, Gerlene Burdock, [name]   2 tablet at 04/07/20 1551  . docusate sodium (COLACE) capsule 100 mg  100 mg Oral BID Brenden Rudman T, MD   100 mg at 04/07/20 0818  . haloperidol (HALDOL) tablet 5 mg  5 mg Oral Q6H PRN Nalla Purdy T, MD   5 mg at 03/31/20 2125   Or  . haloperidol lactate (HALDOL) injection 5 mg  5 mg Intramuscular Q6H PRN Vania Rosero T, MD      . hydrOXYzine (ATARAX/VISTARIL) tablet 25 mg  25 mg Oral Q6H PRN Gillermo Murdoch, NP   25 mg at 04/07/20 1204  . lithium carbonate (LITHOBID) CR tablet 600 mg  600 mg Oral  Q12H Aniza Shor, Jackquline Denmark, MD   600 mg at 04/07/20 0818  . magnesium hydroxide (MILK OF MAGNESIA) suspension 30 mL  30 mL Oral Daily PRN Gillermo Murdoch, NP      . OLANZapine zydis (ZYPREXA) disintegrating tablet 20 mg  20 mg Oral BID Keniya Schlotterbeck, Jackquline Denmark, MD   20 mg at 04/07/20 0818  . penicillin g benzathine (BICILLIN LA) 1200000 UNIT/2ML injection 2.4 Million Units  2.4 Million Units Intramuscular Weekly Lynn Ito, MD   2.4 Million Units at 04/04/20 1538  . polyethylene glycol (MIRALAX / GLYCOLAX) packet 17 g  17 g Oral Daily PRN Money, Gerlene Burdock, [name]      . traZODone (DESYREL) tablet 50 mg  50 mg Oral QHS PRN Gillermo Murdoch, NP   50 mg at 04/05/20 2123    Lab Results: No results found for this or any previous visit (from the past 48 hour(s)).  Blood Alcohol level:  Lab Results  Component Value Date   ETH <10 03/25/2020    Metabolic Disorder Labs: No results found for: HGBA1C, MPG No results found for: PROLACTIN No results found for: CHOL, TRIG, HDL, CHOLHDL, VLDL, LDLCALC  Physical Findings: AIMS:  , ,  ,  ,    CIWA:    COWS:     Musculoskeletal: Strength & Muscle Tone: within normal limits Gait & Station: normal Patient leans: N/A  Psychiatric Specialty Exam: Physical Exam  Nursing note and vitals reviewed. Constitutional: He appears well-developed and well-nourished.  HENT:  Head: Normocephalic and atraumatic.  Eyes: Pupils are equal, round, and reactive to light. Conjunctivae are normal.  Cardiovascular: Regular rhythm and normal heart sounds.  Respiratory: Effort normal.  GI: Soft.  Musculoskeletal:        General: Normal range of motion.     Cervical back: Normal range of motion.  Neurological: He is alert.  Skin: Skin is warm and dry.  Psychiatric: His affect is labile and inappropriate. His speech is tangential. He is agitated. Thought content is paranoid. Cognition and memory are impaired. He expresses inappropriate judgment.    Review of  Systems  Constitutional: Negative.   HENT: Negative.   Eyes: Negative.   Respiratory: Negative.   Cardiovascular: Negative.   Gastrointestinal: Negative.   Musculoskeletal: Negative.   Skin: Negative.   Neurological: Negative.   Psychiatric/Behavioral: Positive for agitation.    Blood pressure 133/75, pulse 96, temperature 99 F (37.2 C), temperature source Oral, resp. rate 18, height 5\' 9"  (1.753 m), weight 74.8 kg, SpO2 99 %.Body mass index is 24.35 kg/m.  General Appearance: Casual  Eye Contact:  Fair  Speech:  Pressured  Volume:  Increased  Mood:  Euphoric and Irritable  Affect:  Inappropriate  Thought Process:  Disorganized  Orientation:  Negative  Thought Content:  Illogical and Paranoid Ideation  Suicidal Thoughts:  No  Homicidal Thoughts:  No  Memory:  Immediate;   Fair Recent;   Poor Remote;   Poor  Judgement:  Poor  Insight:  Lacking  Psychomotor Activity:  Restlessness  Concentration:  Concentration: Poor  Recall:  Poor  Fund of Knowledge:  Poor  Language:  Poor  Akathisia:  No  Handed:  Right  AIMS (if indicated):     Assets:  Resilience Social Support  ADL's:  Impaired  Cognition:  Impaired,  Mild  Sleep:  Number of Hours: 9     Treatment Plan Summary: Daily contact with patient to assess and evaluate symptoms and progress in treatment, Medication management and Plan Patient with recent onset psychosis continues with agitated impulsive disorganized behavior.  The fact that he will not even allow himself to be engaged in a conversation about his medical issues greatly complicates decisions for discharge.  If we could have a discussion that showed evidence of him at least having a capacity and understanding then we could potentially justify him refusing care but in the current circumstance I cannot even say he has had capacity for that.  Continue with lithium and antipsychotics.  It is probably been long enough that we can check a lithium level tomorrow.   Every day we will continue to try to see if we can get him to engage in something like a reasonable conversation.  Once again I have attempted to reach his mother without any success.  Alethia Berthold, MD 04/07/2020, 4:20 PM

## 2020-04-07 NOTE — Progress Notes (Signed)
D: Patient has been isolative to room. Lying in bed in the dark. Denies SI, HI and AVH. Mood is depressed. Affect is sad, but, when asked about his earlier episode he smiled and said "yeah, we made a scene" and seemed pleased with himself.  A: Continue to monitor for safety R: Safety maintained.

## 2020-04-07 NOTE — [facility] Group Notes (Signed)
[facility] Group Notes:  (Nursing/MHT/Case Management/Adjunct)  Date:  04/07/2020  Time:  9:12 PM  Type of Therapy:  Group Therapy  Participation Level:  Did Not Attend   Mayra Neer 04/07/2020, 9:12 PM

## 2020-04-08 DIAGNOSIS — A530 Latent syphilis, unspecified as early or late: Secondary | ICD-10-CM

## 2020-04-08 MED ORDER — OLANZAPINE 20 MG PO TBDP: 20.0000 mg | ORAL_TABLET | Freq: Two times a day (BID) | ORAL | 0 refills | Status: DC

## 2020-04-08 MED ORDER — LITHIUM CARBONATE ER 300 MG PO TBCR: 600.0000 mg | EXTENDED_RELEASE_TABLET | Freq: Two times a day (BID) | ORAL | 0 refills | Status: DC

## 2020-04-08 NOTE — Progress Notes (Signed)
Recreation Therapy Notes  INPATIENT RECREATION TR PLAN  Patient Details Name: Melik Blancett MRN: [id number] DOB: Apr 22, 1986 Today's Date: 04/08/2020  Rec Therapy Plan Is patient appropriate for Therapeutic Recreation?: Yes Treatment times per week: At least 3 Estimated Length of Stay: 5-7 days TR Treatment/Interventions: Group participation (Comment)  Discharge Criteria Pt will be discharged from therapy if:: Discharged Treatment plan/goals/alternatives discussed and agreed upon by:: Patient/family  Discharge Summary Short term goals set: Patient will identify 3 positive coping skills strategies to use post d/c within 5 recreation therapy group sessions Short term goals met: Complete Progress toward goals comments: Groups attended Which groups?: Goal setting, AAA/T, Anger management, Other (Comment)(Happiness, Problem- Solving) Reason goals not met: N/A Therapeutic equipment acquired: N/A Reason patient discharged from therapy: Discharge from hospital Pt/family agrees with progress & goals achieved: Yes Date patient discharged from therapy: 04/08/20   Kaydince Towles 04/08/2020, 1:34 PM

## 2020-04-08 NOTE — Discharge Summary (Signed)
Physician Discharge Summary Note  Patient:  Mark Galloway is an 34 y.o., male MRN:  [id number] DOB:  02/05/1986 Patient phone:  [phone] (home)  Patient address:   [address] [place] [address],  Total Time spent with patient: 30 minutes  Date of Admission:  03/29/2020 Date of Discharge: April 08, 2020  Reason for Admission: Patient was admitted to the hospital under IVC based on family's report that he had been aggressive at home combined with reports of alterations in mental status and behavior  Principal Problem: Severe manic bipolar 1 disorder with psychotic behavior [facility]) Discharge Diagnoses: Principal Problem:   Severe manic bipolar 1 disorder with psychotic behavior ([name]) Active Problems:   Anxiety   Psychosis ([name])   HIV positive ([name])   Positive RPR test   Past Psychiatric History: There is no known past psychiatric history of treatment that has been identified  Past Medical History:  Past Medical History:  Diagnosis Date  . Shingles    History reviewed. No pertinent surgical history. Family History: History reviewed. No pertinent family history. Family Psychiatric  History: See previous notes. Social History:  Social History   Substance and Sexual Activity  Alcohol Use Yes   Comment: weekly     Social History   Substance and Sexual Activity  Drug Use Never    Social History   Socioeconomic History  . Marital status: Single    Spouse name: Not on file  . Number of children: Not on file  . Years of education: Not on file  . Highest education level: Not on file  Occupational History  . Not on file  Tobacco Use  . Smoking status: Current Some Day Smoker  . Smokeless tobacco: Never Used  Substance and Sexual Activity  . Alcohol use: Yes    Comment: weekly  . Drug use: Never  . Sexual activity: Not Currently  Other Topics Concern  . Not on file  Social History Narrative  . Not on file   Social Determinants of Health    Financial Resource Strain:   . Difficulty of Paying Living Expenses:   Food Insecurity:   . Worried About Programme researcher, broadcasting/film/video in the Last Year:   . Barista in the Last Year:   Transportation Needs:   . Freight forwarder (Medical):   Marland Kitchen Lack of Transportation (Non-Medical):   Physical Activity:   . Days of Exercise per Week:   . Minutes of Exercise per Session:   Stress:   . Feeling of Stress :   Social Connections:   . Frequency of Communication with Friends and Family:   . Frequency of Social Gatherings with Friends and Family:   . Attends Religious Services:   . Active Member of Clubs or Organizations:   . Attends Banker Meetings:   Marland Kitchen Marital Status:     Hospital Course: Patient was admitted to the psychiatric unit under IVC.  He was kept under 15-minute checks during his time in the hospital.  Patient has been treated with medication based on symptoms most consistent with mania.  He has been treated with lithium carbonate and olanzapine.  There have been no reports of noncompliance with medicine but the patient refused to allow a lithium blood level so we cannot confirm definitively any level of compliance.  During his time in the hospital the patient has been consistently unpleasant towards other patients.  He has had frequent episodes of insulting hurtful speech and behavior  towards other patients on the unit which have caused a marked decline in the ability of multiple other patients to receive appropriate treatment.  He has not however been physically assaultive although on at least 1 occasion he threw a drink of water at a closed door.  Nevertheless he has not actually been physically assaultive towards any person indicating some level of self-control.  Patient continues to insist in his wealth and be generally grandiose but in the last couple of days speaking with him I have not confirmed anything definitely delusional.  He does not appear to be having  hallucinations.  At no time has the patient made any suicidal statements nor has he made any effort to harm himself.  His general attention to his personal hygiene has been adequate.  Based on previous records we have followed up on a positive HIV test in the records.  HIV test was redrawn and came back as positive.  There was also an old record of a positive syphilis test which from the previous drawl would have probably indicated a false positive but this was repeated as well since there was no history of treatment.  His RPR came back again as positive but this time with a higher titer and with a positive confirmatory test.  Infectious disease consultation was obtained.  Patient's behavior with infectious disease specialist was flippant and uncooperative.  He was unable to hold a conversation and refused to engage in any sort of dialogue about the results of his test or the appropriateness of treatment.  Several times in the past week I have sat down with the patient and calmly told him that I wanted to discuss these important lab results and the recommended work-up and treatment.  On every 1 of these occasions the patient has immediately stood up and insulted me and walked out of the room saying that he refused to have any discussion on the topic whatsoever.  This morning I saw the patient in his room and told him very directly that he was HIV positive, that his blood tests indicated untreated illness, that this put him at risk of the well-known morbid and fatal consequences of HIV disease.  I told him that medication available now could have a profound effect on that outcome and was strongly recommended.  Patient talked over me throughout my attempt to educate him hurling insults towards me and using profanity.  Nevertheless, he did say that he was aware of all that and did not care about it.  I also informed him of the positive RPR and of the potentially fatal outcome and otherwise negative outcomes of advanced  syphilis including neurosyphilis.  Advised him that it was recommended that he have a spinal tap to complete diagnosis and continue treatment.  Again, he talked over me throughout this in an insulting manner making it clear he had no interest in the conversation whatsoever.  Patient does continue to have symptoms that appear to be different than his previous baseline with behaviors that put him at risk of negative outcome although at this point the primary mechanism of negative outcome that seems likely is either through his own neglect of his healthcare or through behaviors that could get him in trouble with the authorities.  He does not appear based on his behavior in the hospital to be at high risk of physical assault to other people and there has not appeared to be a specific high risk of suicidal behavior at any point.  His behavior  on the unit throughout this hospitalization has been disruptive to the degree that it has had adverse effects on the treatment of multiple other patients.  There is no indication that he will be changing any of that behavior.  Given the resources and nature of our facility it does not appear possible to pursue any more aggressive treatment such as isolation or forced medications repeatedly and it is not clear that that would be appropriate in any case.  Therefore, despite the patient still having symptoms, I am electing to discharge him from the hospital today.  I have tried several times in the past week to contact his mother by telephone but have received no answer and most recently the phone number said that no one was available.  Patient is unwilling to cooperate with appropriate follow-up planning.  He appears to have financial and educational resources that would allow him to safely take care of himself and find a place to live.  He has been encouraged multiple times to follow-up with outpatient mental health care and he will be given a prescription of lithium and Zyprexa  at discharge with instructions that he follow-up with current medication and outpatient treatment.  Physical Findings: AIMS:  , ,  ,  ,    CIWA:    COWS:     Musculoskeletal: Strength & Muscle Tone: within normal limits Gait & Station: normal Patient leans: N/A  Psychiatric Specialty Exam: Physical Exam  Nursing note and vitals reviewed. Constitutional: He appears well-developed and well-nourished.  HENT:  Head: Normocephalic and atraumatic.  Eyes: Pupils are equal, round, and reactive to light. Conjunctivae are normal.  Cardiovascular: Normal heart sounds.  Respiratory: Effort normal.  GI: Soft.  Musculoskeletal:        General: Normal range of motion.     Cervical back: Normal range of motion.  Neurological: He is alert.  Skin: Skin is warm and dry.  Psychiatric: His affect is inappropriate. His speech is rapid and/or pressured. He is agitated. He is not aggressive, not hyperactive and not combative. Thought content is not paranoid. Cognition and memory are impaired. He expresses impulsivity. He expresses no homicidal and no suicidal ideation.    Review of Systems  Constitutional: Negative.   HENT: Negative.   Eyes: Negative.   Respiratory: Negative.   Cardiovascular: Negative.   Gastrointestinal: Negative.   Musculoskeletal: Negative.   Skin: Negative.   Neurological: Negative.   Psychiatric/Behavioral: Negative.     Blood pressure 131/71, pulse 98, temperature 98.7 F (37.1 C), temperature source Oral, resp. rate 18, height 5\' 9"  (1.753 m), weight 74.8 kg, SpO2 100 %.Body mass index is 24.35 kg/m.  General Appearance: Casual  Eye Contact:  Good  Speech:  Clear and Coherent  Volume:  Increased  Mood:  Euthymic  Affect:  Inappropriate  Thought Process:  Coherent  Orientation:  Full (Time, Place, and Person)  Thought Content:  Illogical  Suicidal Thoughts:  No  Homicidal Thoughts:  No  Memory:  Immediate;   Fair Recent;   Fair Remote;   Fair  Judgement:   Impaired  Insight:  Shallow  Psychomotor Activity:  Restlessness  Concentration:  Concentration: Poor  Recall:  of Knowledge:  Fair  Language:  Fair  Akathisia:  No  Handed:  Right  AIMS (if indicated):     Assets:  Resilience  ADL's:  Intact  Cognition:  Impaired,  Mild  Sleep:  Number of Hours: 7     Have you used  any form of tobacco in the last 30 days? (Cigarettes, Smokeless Tobacco, Cigars, and/or Pipes): Yes  Has this patient used any form of tobacco in the last 30 days? (Cigarettes, Smokeless Tobacco, Cigars, and/or Pipes) Yes, No  Blood Alcohol level:  Lab Results  Component Value Date   ETH <10 17/61/6073    Metabolic Disorder Labs:  No results found for: HGBA1C, MPG No results found for: PROLACTIN No results found for: CHOL, TRIG, HDL, CHOLHDL, VLDL, LDLCALC  See Psychiatric Specialty Exam and Suicide Risk Assessment completed by Attending Physician prior to discharge.  Discharge destination:  Home  Is patient on multiple antipsychotic therapies at discharge:  No   Has Patient had three or more failed trials of antipsychotic monotherapy by history:  No  Recommended Plan for Multiple Antipsychotic Therapies: NA  Discharge Instructions    Diet - low sodium heart healthy   Complete by: As directed    Increase activity slowly   Complete by: As directed      Allergies as of 04/08/2020   No Known Allergies     Medication List    STOP taking these medications   cephALEXin 500 MG capsule Commonly known as: KEFLEX   elvitegravir-cobicistat-emtricitabine-tenofovir 150-150-200-10 MG Tabs tablet Commonly known as: GENVOYA   gabapentin 100 MG capsule Commonly known as: NEURONTIN     TAKE these medications     Indication  lithium carbonate 300 MG CR tablet Commonly known as: LITHOBID Take 2 tablets (600 mg total) by mouth every 12 (twelve) hours.  Indication: Manic-Depression   OLANZapine zydis 20 MG disintegrating tablet Commonly known  as: ZYPREXA Take 1 tablet (20 mg total) by mouth 2 (two) times daily. What changed:   medication strength  how much to take  when to take this  Indication: Manic Phase of Manic-Depression        Follow-up recommendations:  Activity:  Activity as tolerated Diet:  Regular diet Other:  Follow-up with outpatient mental health and medical treatment as directed multiple times  Comments: Patient will be provided with prescriptions for current medication which to all observation he appears to be compliant with and tolerating  Signed: Alethia Berthold, MD 04/08/2020, 9:28 AM

## 2020-04-08 NOTE — Progress Notes (Signed)
Recreation Therapy Notes   Date: 04/08/2020  Time: 9:30 am   Location: Craft room   Behavioral response: N/A   Intervention Topic: Communication     Discussion/Intervention: Patient did not attend group.   Clinical Observations/Feedback:  Patient did not attend group.   Caiya Bettes LRT/CTRS        Amaia Lavallie 04/08/2020 11:25 AM

## 2020-04-08 NOTE — Progress Notes (Signed)
Patient ID: Mark Galloway, male   DOB: July 05, 1986, 34 y.o.   MRN: [id number]   Discharge Note:  Patient denies SI/HI/AVH at this time. Discharge instructions, AVS, prescriptions, and transition record gone over with patient. Patient agrees to comply with medication management, however, he declined follow-up. Patient belongings returned to patient. Patient questions and concerns addressed and answered. Patient ambulatory off unit. Patient discharged to home with Mother.

## 2020-04-08 NOTE — Discharge Summary (Signed)
Addendum to discharge summary: After reviewing discharge summary but while patient is still here I would like to add another note clarifying the thought process of his discharge.  Multiple attempts have been made to engage the patient in conversation about his medical problems.  Patient is able to engage in conversation on other topics and it is only when we try and talk about his sexually transmitted diseases that the patient becomes oppositional.  Responses he has made during those times indicate that he does understand the nature of the conversation.  He was clearly able to hear me and understand the content of what I was saying today regarding both the HIV and the RPR test.  He responded to me with specific curses and insults about those things and why he did not care about them.  Patient does not appear to be delusional in most areas of interaction.  Although he is still grandiose he has not been saying frankly delusional things.  He otherwise appears to be able to interact with people in a nonpsychotic manner.  Based on all of this I think the patient does have basic capacity around discussion and treatment of his sexually transmitted diseases and is just expressing a refusal to engage in any treatment about them.  I do not think that this current attitude or behavior is strictly the result of a mental health problem so much as a attitude and personality problem.  I think this is unlikely to change from treatment particularly given the response we have seen so far.  Again, another very strong consideration is that the patient's behavior is consistently inhibiting other patients on the ward from benefiting from treatment.  Because of this balance of the pros and cons of decision making I have decided to discharge the patient today but with recommendations for outpatient follow-up.

## 2020-04-08 NOTE — Plan of Care (Signed)
  Problem: Coping Skills Goal: STG - Patient will identify 3 positive coping skills strategies to use post d/c within 5 recreation therapy group sessions Description: STG - Patient will identify 3 positive coping skills strategies to use post d/c within 5 recreation therapy group sessions Outcome: Completed/Met

## 2020-04-08 NOTE — [facility] Group Notes (Signed)
[facility] Group Notes:  (Nursing/MHT/Case Management/Adjunct)  Date:  04/08/2020  Time:  11:31 AM  Type of Therapy:  Psychoeducational Skills  Participation Level:  Active  Participation Quality:  Appropriate  Affect:  Appropriate  Cognitive:  Alert and Appropriate  Insight:  Appropriate and Good  Engagement in Group:  Engaged  Modes of Intervention:  Clarification  Summary of Progress/Problems:  Mark Galloway 04/08/2020, 11:31 AM

## 2020-04-08 NOTE — [facility] Group Notes (Signed)
[facility] Group Notes:  (Nursing/MHT/Case Management/Adjunct)  Date:  04/08/2020  Time:  9:30 AM  Type of Therapy:  Community Meeting  Participation Level:  Active  Participation Quality:  Appropriate, Attentive and Sharing  Affect:  Appropriate  Cognitive:  Alert and Appropriate  Insight:  Appropriate  Engagement in Group:  Engaged  Modes of Intervention:  Discussion, Education and Support  Summary of Progress/Problems:  Mark Galloway 04/08/2020, 9:30 AM

## 2020-04-08 NOTE — [facility] Suicide Risk Assessment (Signed)
[facility] Discharge Suicide Risk Assessment   Principal Problem: Severe manic bipolar 1 disorder with psychotic behavior ([name]) Discharge Diagnoses: Principal Problem:   Severe manic bipolar 1 disorder with psychotic behavior ([name]) Active Problems:   Anxiety   Psychosis ([name])   HIV positive ([name])   Total Time spent with patient: 30 minutes  Musculoskeletal: Strength & Muscle Tone: within normal limits Gait & Station: normal Patient leans: N/A  Psychiatric Specialty Exam: Review of Systems  Constitutional: Negative.   HENT: Negative.   Eyes: Negative.   Respiratory: Negative.   Cardiovascular: Negative.   Gastrointestinal: Negative.   Musculoskeletal: Negative.   Skin: Negative.   Neurological: Negative.   Psychiatric/Behavioral: Negative for agitation and behavioral problems.    Blood pressure 131/71, pulse 98, temperature 98.7 F (37.1 C), temperature source Oral, resp. rate 18, height 5\' 9"  (1.753 m), weight 74.8 kg, SpO2 100 %.Body mass index is 24.35 kg/m.  General Appearance: Casual  Eye Contact::  Good  Speech:  Clear and Coherent409  Volume:  Increased  Mood:  Angry and Hopeless  Affect:  Inappropriate and Labile  Thought Process:  Coherent  Orientation:  Full (Time, Place, and Person)  Thought Content:  Logical  Suicidal Thoughts:  No  Homicidal Thoughts:  No  Memory:  Immediate;   Fair Recent;   Fair Remote;   Fair  Judgement:  Poor  Insight:  Shallow  Psychomotor Activity:  Restlessness  Concentration:  Poor  Recall:  [phone] of Knowledge:Fair  Language: Poor  Akathisia:  No  Handed:  Right  AIMS (if indicated):     Assets:  Resilience  Sleep:  Number of Hours: 7  Cognition: WNL  ADL's:  Intact   Mental Status Per Nursing Assessment::   On Admission:  NA  Demographic Factors:  Male, Gay, lesbian, or bisexual orientation and Living alone  Loss Factors: Decline in physical health  Historical Factors: Impulsivity  Risk Reduction Factors:    Employed and Positive social support  Continued Clinical Symptoms:  Bipolar Disorder:   Mixed State  Cognitive Features That Contribute To Risk:  Closed-mindedness    Suicide Risk:  Minimal: No identifiable suicidal ideation.  Patients presenting with no risk factors but with morbid ruminations; may be classified as minimal risk based on the severity of the depressive symptoms    Plan Of Care/Follow-up recommendations:  Activity:  Activity as tolerated Diet:  Regular diet Other:  Patient has been educated multiple times about psychiatric illness medical illness potential bad outcomes including death if he does not follow-up with outpatient treatment.  [phone], MD 04/08/2020, 9:13 AM

## 2020-04-08 NOTE — Progress Notes (Signed)
D- Patient alert and oriented. Patient presents in a pleasant mood on assessment reporting that he slept "fair" last night and had no complaints to voice to this Clinical research associate. Patient denies SI, HI, AVH, and pain at this time. Patient also denies any signs/symptoms of depression/anxiety. Patient's goals for today is "organization/creating peace", in which he will "be happy", in order to achieve his goal.  A- Scheduled medications administered to patient, per MD orders. Support and encouragement provided.  Routine safety checks conducted every 15 minutes.  Patient informed to notify staff with problems or concerns.  R- No adverse drug reactions noted. Patient contracts for safety at this time. Patient compliant with medications and treatment plan. Patient receptive, calm, and cooperative. Patient interacts well with others on the unit.  Patient remains safe at this time.

## 2020-04-08 NOTE — BHH Counselor (Signed)
Pt declined aftercare when asked by CSW team.  Penni Homans, MSW, LCSW 04/08/2020 11:29 AM

## 2020-04-08 NOTE — [facility] Group Notes (Signed)
LCSW Group Therapy Note  04/08/2020 1:00 PM  Type of Therapy/Topic:  Group Therapy:  Emotion Regulation  Participation Level:  Did Not Attend   Description of Group:   The purpose of this group is to assist patients in learning to regulate negative emotions and experience positive emotions. Patients will be guided to discuss ways in which they have been vulnerable to their negative emotions. These vulnerabilities will be juxtaposed with experiences of positive emotions or situations, and patients will be challenged to use positive emotions to combat negative ones. Special emphasis will be placed on coping with negative emotions in conflict situations, and patients will process healthy conflict resolution skills.  Therapeutic Goals: 1. Patient will identify two positive emotions or experiences to reflect on in order to balance out negative emotions 2. Patient will label two or more emotions that they find the most difficult to experience 3. Patient will demonstrate positive conflict resolution skills through discussion and/or role plays  Summary of Patient Progress: X  Therapeutic Modalities:   Cognitive Behavioral Therapy Feelings Identification Dialectical Behavioral Therapy  Penni Homans, MSW, LCSW 04/08/2020 2:50 PM

## 2020-04-08 NOTE — Progress Notes (Addendum)
  [facility] Adult Case Management Discharge Plan :  Will you be returning to the same living situation after discharge:  Yes,  pt states he plans to go to his mother's house and then fly back to his home in [place] At discharge, do you have transportation home?: Yes,  safe transport. Pt will go to [employer] in [place] to pick up his vehicle Do you have the ability to pay for your medications: No.  Release of information consent forms completed and in the chart;  Patient's signature needed at discharge.  Patient to Follow up at: Follow-up Information    Patient declined follow up Follow up.           Next level of care provider has access to [facility] Link:no  Safety Planning and Suicide Prevention discussed: Yes,  with pt; declined family contact  Have you used any form of tobacco in the last 30 days? (Cigarettes, Smokeless Tobacco, Cigars, and/or Pipes): Yes  Has patient been referred to the Quitline?: Patient refused referral  Patient has been referred for addiction treatment: N/A  Suzan Slick, LCSW 04/08/2020, 3:01 PM

## 2020-11-30 ENCOUNTER — Emergency Department (HOSPITAL_COMMUNITY)
Admission: EM | Admit: 2020-11-30 | Discharge: 2020-12-01 | Disposition: A | Discharge status: Home / Self Care | Payer: Commercial Managed Care - PPO | Attending: Emergency Medicine | Admitting: Emergency Medicine

## 2020-11-30 ENCOUNTER — Encounter (HOSPITAL_COMMUNITY): Payer: Self-pay

## 2020-11-30 DIAGNOSIS — F17200 Nicotine dependence, unspecified, uncomplicated: Secondary | ICD-10-CM | POA: Diagnosis not present

## 2020-11-30 DIAGNOSIS — Z21 Asymptomatic human immunodeficiency virus [HIV] infection status: Secondary | ICD-10-CM | POA: Insufficient documentation

## 2020-11-30 DIAGNOSIS — Z20822 Contact with and (suspected) exposure to covid-19: Secondary | ICD-10-CM | POA: Diagnosis not present

## 2020-11-30 DIAGNOSIS — J181 Lobar pneumonia, unspecified organism: Secondary | ICD-10-CM | POA: Insufficient documentation

## 2020-11-30 DIAGNOSIS — R1031 Right lower quadrant pain: Secondary | ICD-10-CM | POA: Diagnosis present

## 2020-11-30 DIAGNOSIS — J189 Pneumonia, unspecified organism: Secondary | ICD-10-CM

## 2020-11-30 LAB — COMPREHENSIVE METABOLIC PANEL
ALT: 27 U/L (ref 0–44)
AST: 32 U/L (ref 15–41)
Albumin: 3.8 g/dL (ref 3.5–5.0)
Alkaline Phosphatase: 60 U/L (ref 38–126)
Anion gap: 8 (ref 5–15)
BUN: 8 mg/dL (ref 6–20)
CO2: 25 mmol/L (ref 22–32)
Calcium: 9.5 mg/dL (ref 8.9–10.3)
Chloride: 102 mmol/L (ref 98–111)
Creatinine, Ser: 0.81 mg/dL (ref 0.61–1.24)
GFR, Estimated: >60 mL/min (ref >60)
Glucose, Bld: 114 mg/dL — ABNORMAL HIGH (ref 70–99)
Potassium: 3.5 mmol/L (ref 3.5–5.1)
Sodium: 135 mmol/L (ref 135–145)
Total Bilirubin: 0.5 mg/dL (ref 0.3–1.2)
Total Protein: 9.7 g/dL — ABNORMAL HIGH (ref 6.5–8.1)

## 2020-11-30 LAB — CBC
HCT: 33.4 % — ABNORMAL LOW (ref 39.0–52.0)
Hemoglobin: 11.2 g/dL — ABNORMAL LOW (ref 13.0–17.0)
MCH: 33.6 pg (ref 26.0–34.0)
MCHC: 33.5 g/dL (ref 30.0–36.0)
MCV: 100.3 fL — ABNORMAL HIGH (ref 80.0–100.0)
Platelets: 103 10*3/uL — ABNORMAL LOW (ref 150–400)
RBC: 3.33 MIL/uL — ABNORMAL LOW (ref 4.22–5.81)
RDW: 12.5 % (ref 11.5–15.5)
WBC: 4.3 10*3/uL (ref 4.0–10.5)
nRBC: 0 % (ref 0.0–0.2)

## 2020-11-30 LAB — URINALYSIS, ROUTINE W REFLEX MICROSCOPIC
Bacteria, UA: NONE SEEN
Bilirubin Urine: NEGATIVE
Glucose, UA: NEGATIVE mg/dL
Ketones, ur: NEGATIVE mg/dL
Leukocytes,Ua: NEGATIVE
Nitrite: NEGATIVE
Protein, ur: NEGATIVE mg/dL
Specific Gravity, Urine: 1.023 (ref 1.005–1.030)
pH: 6 (ref 5.0–8.0)

## 2020-11-30 LAB — LIPASE, BLOOD: Lipase: 37 U/L (ref 11–51)

## 2020-11-30 NOTE — ED Triage Notes (Signed)
Pt reports that since yesterday he has been having severe RLQ abd pain, denies n/v/d/fevers

## 2020-12-01 ENCOUNTER — Ambulatory Visit: Payer: Commercial Managed Care - PPO

## 2020-12-01 ENCOUNTER — Emergency Department (HOSPITAL_COMMUNITY): Payer: Commercial Managed Care - PPO

## 2020-12-01 ENCOUNTER — Other Ambulatory Visit (HOSPITAL_COMMUNITY)
Admission: RE | Admit: 2020-12-01 | Discharge: 2020-12-01 | Disposition: A | Discharge status: Home / Self Care | Payer: Commercial Managed Care - PPO | Source: Ambulatory Visit | Attending: Family | Admitting: Family

## 2020-12-01 ENCOUNTER — Ambulatory Visit (INDEPENDENT_AMBULATORY_CARE_PROVIDER_SITE_OTHER): Payer: Commercial Managed Care - PPO | Admitting: Family

## 2020-12-01 ENCOUNTER — Encounter: Payer: Self-pay | Admitting: Family

## 2020-12-01 ENCOUNTER — Ambulatory Visit (INDEPENDENT_AMBULATORY_CARE_PROVIDER_SITE_OTHER): Payer: Commercial Managed Care - PPO | Admitting: Pharmacist

## 2020-12-01 ENCOUNTER — Other Ambulatory Visit: Payer: Self-pay

## 2020-12-01 VITALS — BP 108/66 | HR 89 | Temp 98.0°F | Ht 68.0 in | Wt 192.0 lb

## 2020-12-01 DIAGNOSIS — Z113 Encounter for screening for infections with a predominantly sexual mode of transmission: Secondary | ICD-10-CM

## 2020-12-01 DIAGNOSIS — Z79899 Other long term (current) drug therapy: Secondary | ICD-10-CM | POA: Diagnosis not present

## 2020-12-01 DIAGNOSIS — A530 Latent syphilis, unspecified as early or late: Secondary | ICD-10-CM

## 2020-12-01 DIAGNOSIS — Z21 Asymptomatic human immunodeficiency virus [HIV] infection status: Secondary | ICD-10-CM

## 2020-12-01 DIAGNOSIS — B20 Human immunodeficiency virus [HIV] disease: Secondary | ICD-10-CM

## 2020-12-01 LAB — RESP PANEL BY RT-PCR (FLU A&B, COVID) ARPGX2
Influenza A by PCR: NEGATIVE
Influenza B by PCR: NEGATIVE
SARS Coronavirus 2 by RT PCR: NEGATIVE

## 2020-12-01 MED ORDER — SULFAMETHOXAZOLE-TRIMETHOPRIM 800-160 MG PO TABS: 1.0000 | ORAL_TABLET | Freq: Every day | ORAL | 3 refills | Status: DC

## 2020-12-01 MED ORDER — IOHEXOL 300 MG/ML  SOLN
100.0000 mL | Freq: Once | INTRAMUSCULAR | Status: AC | PRN
  Administered 2020-12-01: 100 mL via INTRAVENOUS

## 2020-12-01 MED ORDER — KETOROLAC TROMETHAMINE 30 MG/ML IJ SOLN
30.0000 mg | Freq: Once | INTRAMUSCULAR | Status: AC
  Administered 2020-12-01: 30 mg via INTRAVENOUS
  Filled 2020-12-01: qty 1

## 2020-12-01 MED ORDER — DOXYCYCLINE HYCLATE 100 MG PO CAPS: 100.0000 mg | ORAL_CAPSULE | Freq: Two times a day (BID) | ORAL | 0 refills | Status: DC

## 2020-12-01 MED ORDER — DOXYCYCLINE HYCLATE 100 MG PO CAPS: 100.0000 mg | ORAL_CAPSULE | Freq: Two times a day (BID) | ORAL | 0 refills | Status: AC

## 2020-12-01 MED ORDER — BICTEGRAVIR-EMTRICITAB-TENOFOV 50-200-25 MG PO TABS: 1.0000 | ORAL_TABLET | Freq: Every day | ORAL | 3 refills | Status: DC

## 2020-12-01 NOTE — Discharge Instructions (Signed)
You have an appointment with the infectious disease clinic at 11:30am. Please go to their office.   Your CT scan showed evidence of some pneumonia in the left lower lung.  We will plan to treat this.  Additionally, he has some lymphadenopathy noted in your CT scan.  As we discussed, this can be because of chronic HIV infection.  Return the emergency department for any fever, cough, difficulty breathing or any other worsening concerning symptoms.

## 2020-12-01 NOTE — ED Provider Notes (Signed)
[facility] EMERGENCY DEPARTMENT Provider Note   CSN: [id number] Arrival date & time: 11/30/20  2153     History Chief Complaint  Patient presents with  . Abdominal Pain    Keshaun Dubey is a 34 y.o. male with PMH/o HIV (CD4 is 28 as of 4/21) who presents for evaluation of RLQ abdominal pain that began yesterday. He describes it as a dull ache that does not radiate. He will occasionally get some pain in his back but does not know if that's his normal back pain. He does not feel that the RLQ abdominal pain radiates to his back. He has been able to eat and drink without any difficulty since onset of pain. He has not had any nausea/vomiting/diarrhea. He has not noted any fevers. He states that the pain is sometimes worse when he bends or changes position. He does not recall any preceding trauma, injury or fall. He states he has been having some nasal congestion and rhinorrhea as well. He has not been vaccinated for COVID. He denies any CP, SOB, dysuria, hematuria, testicular pain/swelling, penile pain/swelling. No prior history of abdominal surgeries.   The history is provided by the patient.       Past Medical History:  Diagnosis Date  . Shingles     Patient Active Problem List   Diagnosis Date Noted  . 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 HIV positive ([name]) 03/30/2020  . Psychosis ([name])   . Anxiety   . Severe manic bipolar 1 disorder with psychotic behavior ([name]) 03/25/2020    History reviewed. No pertinent surgical history.     No family history on file.  Social History   Tobacco Use  . Smoking status: Current Some Day Smoker  . Smokeless tobacco: Never Used  Vaping Use  . Vaping Use: Never used  Substance Use Topics  . Alcohol use: Yes    Comment: weekly  . Drug use: Never    Home Medications Prior to Admission medications   Medication Sig Start Date End Date Taking? Authorizing Provider  doxycycline (VIBRAMYCIN) 100 MG capsule Take 1 capsule  (100 mg total) by mouth 2 (two) times daily for 7 days. 12/01/20 12/08/20  Maxwell Caul, PA-C  lithium carbonate (LITHOBID) 300 MG CR tablet Take 2 tablets (600 mg total) by mouth every 12 (twelve) hours. 04/08/20   [name], Jackquline Denmark, MD  OLANZapine zydis (ZYPREXA) 20 MG disintegrating tablet Take 1 tablet (20 mg total) by mouth 2 (two) times daily. 04/08/20   [name], Jackquline Denmark, MD    Allergies    Patient has no known allergies.  Review of Systems   Review of Systems  Constitutional: Negative for fever.  HENT: Positive for congestion and rhinorrhea.   Respiratory: Negative for cough and shortness of breath.   Cardiovascular: Negative for chest pain.  Gastrointestinal: Positive for abdominal pain. Negative for nausea and vomiting.  Genitourinary: Negative for dysuria and hematuria.  Neurological: Negative for headaches.  All other systems reviewed and are negative.   Physical Exam Updated Vital Signs BP 113/77   Pulse 85   Temp 98.2 F (36.8 C)   Resp 19   SpO2 97%   Physical Exam Vitals and nursing note reviewed.  Constitutional:      Appearance: Normal appearance. He is well-developed.  HENT:     Head: Normocephalic and atraumatic.  Eyes:     General: Lids are normal.     Conjunctiva/sclera: Conjunctivae normal.     Pupils: Pupils are  equal, round, and reactive to light.  Cardiovascular:     Rate and Rhythm: Normal rate and regular rhythm.     Pulses: Normal pulses.     Heart sounds: Normal heart sounds. No murmur heard.  No friction rub. No gallop.   Pulmonary:     Effort: Pulmonary effort is normal.     Breath sounds: Normal breath sounds.     Comments: Lungs clear to auscultation bilaterally.  Symmetric chest rise.  No wheezing, rales, rhonchi. Abdominal:     Palpations: Abdomen is soft. Abdomen is not rigid.     Tenderness: There is abdominal tenderness in the right lower quadrant. There is no guarding.     Comments: Abdomen is soft, nondistended.  Tenderness  palpation of the right lower quadrant.  No rigidity, guarding.  No CVA tenderness.   Musculoskeletal:        General: Normal range of motion.     Cervical back: Full passive range of motion without pain.  Skin:    General: Skin is warm and dry.     Capillary Refill: Capillary refill takes less than 2 seconds.  Neurological:     Mental Status: He is alert and oriented to person, place, and time.  Psychiatric:        Speech: Speech normal.     ED Results / Procedures / Treatments   Labs (all labs ordered are listed, but only abnormal results are displayed) Labs Reviewed  COMPREHENSIVE METABOLIC PANEL - Abnormal; Notable for the following components:      Result Value   Glucose, Bld 114 (*)    Total Protein 9.7 (*)    All other components within normal limits  CBC - Abnormal; Notable for the following components:   RBC 3.33 (*)    Hemoglobin 11.2 (*)    HCT 33.4 (*)    MCV 100.3 (*)    Platelets 103 (*)    All other components within normal limits  URINALYSIS, ROUTINE W REFLEX MICROSCOPIC - Abnormal; Notable for the following components:   Hgb urine dipstick SMALL (*)    All other components within normal limits  RESP PANEL BY RT-PCR (FLU A&B, COVID) ARPGX2  LIPASE, BLOOD    EKG None  Radiology CT ABDOMEN PELVIS W CONTRAST  Result Date: 12/01/2020 CLINICAL DATA:  Right lower quadrant abdominal pain, HIV positive EXAM: CT ABDOMEN AND PELVIS WITH CONTRAST TECHNIQUE: Multidetector CT imaging of the abdomen and pelvis was performed using the standard protocol following bolus administration of intravenous contrast. CONTRAST:  OMNIPAQUE IOHEXOL 300 MG/ML  SOLN COMPARISON:  None. FINDINGS: Lower chest: Left lower lobe mixed interstitial and airspace opacity noted compatible with pneumonia. Minor atelectasis in the right lower lobe. Normal heart size. No pericardial or pleural effusion. Hepatobiliary: No focal liver abnormality is seen. No gallstones, gallbladder wall  thickening, or biliary dilatation. Pancreas: Unremarkable. No pancreatic ductal dilatation or surrounding inflammatory changes. Spleen: Normal in size without focal abnormality. Adrenals/Urinary Tract: Adrenal glands are unremarkable. Kidneys are normal, without renal calculi, focal lesion, or hydronephrosis. Bladder is unremarkable. Stomach/Bowel: Stomach is within normal limits. Appendix appears normal. No evidence of bowel wall thickening, distention, or inflammatory changes. Vascular/Lymphatic: Normal caliber aorta. No acute vascular finding. Mesenteric and renal vasculature all appear patent. No veno-occlusive process. Mild diffuse retroperitoneal, pelvic iliac, and inguinal adenopathy noted. Retroperitoneal lymph nodes have short axis measurements of 1 cm. Bilateral external iliac lymph nodes measure up to 1.5 cm in short axis. Index inguinal lymph node on  the right measures 1.3 cm in short axis. This can be seen with chronic HIV infection versus low-grade lymphoproliferative process. Reproductive: No significant finding by CT Other: No abdominal wall hernia or abnormality. No abdominopelvic ascites. Musculoskeletal: No acute or significant osseous findings. IMPRESSION: No acute intra-abdominal or pelvic finding by CT. Left lower lobe mixed interstitial and airspace opacity compatible with pneumonia. Mild diffuse retroperitoneal, pelvic iliac, and inguinal adenopathy. See above comment. Electronically Signed   By: Judie Petit.  Shick M.D.   On: 12/01/2020 07:45    Procedures Procedures (including critical care time)  Medications Ordered in ED Medications  ketorolac (TORADOL) 30 MG/ML injection 30 mg (30 mg Intravenous Given 12/01/20 0753)  iohexol (OMNIPAQUE) 300 MG/ML solution 100 mL (100 mLs Intravenous Contrast Given 12/01/20 0109)    ED Course  I have reviewed the triage vital signs and the nursing notes.  Pertinent labs & imaging results that were available during my care of the patient were reviewed  by me and considered in my medical decision making (see chart for details).    MDM Rules/Calculators/A&P                          34 year old male past history of HIV who presents for evaluation of right lower quadrant abdominal pain that began yesterday.  No associated fever, nausea/vomiting/diarrhea. Patient is afebrile, non-toxic appearing, sitting comfortably on examination table. Vital signs reviewed and stable.  On exam, he has tenderness palpation in the right lower quadrant.  No rigidity, guarding.  No CVA tenderness.  Question if this is musculoskeletal in etiology given that it is worse when he bends, moves.  Low suspicion for appendicitis given history/physical exam but does have tenderness in the right lower quadrant.  Also consider kidney stone though he does not have any CVA tenderness, urinary complaints.  Labs ordered at triage.  CMP is unremarkable.  CBC shows no leukocytosis.  UA shows small amount of hemoglobin.  No infectious etiology.  Lipase normal.   CT scan shows no acute intra-abdominal or pelvic finding.  Appendix appears normal. There is left lower lobe opacity compatible with pneumonia.  He has mild diffuse retroperitoneal, pelvic, iliac and inguinal adenopathy. This could be due to chronic HIV.  Discussed results with patient.  Patient has not had any lab work done since April 2021 when he was admitted to the hospital.  He states he travels back and forth from [place] but is staying in [place] for period of time.  He has not seen his infectious disease doctor in quite a while.  He states he has previously been on Genvoya but is not currently on any medication.  I discussed with Dr. Earlene Plater (infectious disease).  He will attempt to have him seen in the outpatient clinic.  Discussed with infectious disease nurse practitioner.  Patient has an appointment today at 11:30 AM.  We will discharge him with doxycycline for pneumonia.  Patient instructed follow-up with infectious  disease as directed. At this time, patient exhibits no emergent life-threatening condition that require further evaluation in ED. Question if his pain is MSK given that its worse when moving. Patient had ample opportunity for questions and discussion. All patient's questions were answered with full understanding. Strict return precautions discussed. Patient expresses understanding and agreement to plan.   Portions of this note were generated with Scientist, clinical (histocompatibility and immunogenetics). Dictation errors may occur despite best attempts at proofreading.  Final Clinical Impression(s) / ED Diagnoses Final diagnoses:  Right lower quadrant abdominal pain  Community acquired pneumonia of left lower lobe of lung    Rx / DC Orders ED Discharge Orders         Ordered    doxycycline (VIBRAMYCIN) 100 MG capsule  2 times daily        12/01/20 0910           Maxwell CaulLayden, Kearia Yin A, PA-C 12/01/20 0942    Melene PlanFloyd, Dan, DO 12/01/20 1222

## 2020-12-01 NOTE — Progress Notes (Signed)
HPI: Mark Galloway is a 34 y.o. male who presents to the [facility] today to initiate care for a newly diagnosed HIV infection.  Patient Active Problem List   Diagnosis Date Noted   Positive RPR test 04/08/2020   HIV positive ([name]) 03/30/2020   Psychosis ([name])    Anxiety    Severe manic bipolar 1 disorder with psychotic behavior ([name]) 03/25/2020    Patient's Medications  New Prescriptions   No medications on file  Previous Medications   DOXYCYCLINE (VIBRAMYCIN) 100 MG CAPSULE    Take 1 capsule (100 mg total) by mouth 2 (two) times daily for 7 days.  Modified Medications   No medications on file  Discontinued Medications   No medications on file    Allergies: No Known Allergies  Past Medical History: Past Medical History:  Diagnosis Date   HIV infection ([name])    Shingles     Social History: Social History   Socioeconomic History   Marital status: Single    Spouse name: Not on file   Number of children: 0   Years of education: 16   Highest education level: Not on file  Occupational History   Occupation: Public relations account executive  Tobacco Use   Smoking status: Former Smoker   Smokeless tobacco: Never Used  Building services engineer Use: Never used  Substance and Sexual Activity   Alcohol use: Not Currently   Drug use: Never   Sexual activity: Not Currently  Other Topics Concern   Not on file  Social History Narrative   Not on file   Social Determinants of Health   Financial Resource Strain:    Difficulty of Paying Living Expenses: Not on file  Food Insecurity:    Worried About Programme researcher, broadcasting/film/video in the Last Year: Not on file   [employer] of Food in the Last Year: Not on file  Transportation Needs:    Lack of Transportation (Medical): Not on file   Lack of Transportation (Non-Medical): Not on file  Physical Activity:    Days of Exercise per Week: Not on file   Minutes of Exercise per Session: Not on file  Stress:    Feeling of Stress :  Not on file  Social Connections:    Frequency of Communication with Friends and Family: Not on file   Frequency of Social Gatherings with Friends and Family: Not on file   Attends Religious Services: Not on file   Active Member of Clubs or Organizations: Not on file   Attends Banker Meetings: Not on file   Marital Status: Not on file    Labs: Lab Results  Component Value Date   HIV1RNAQUANT 157,000 04/02/2020    RPR and STI Lab Results  Component Value Date   LABRPR Reactive (A) 03/30/2020   LABRPR Reactive (A) 11/18/2018    No flowsheet data found.  Hepatitis B Lab Results  Component Value Date   HEPBSAG Negative 11/18/2018   Hepatitis C No results found for: HEPCAB, HCVRNAPCRQN Hepatitis A No results found for: HAV Lipids: No results found for: CHOL, TRIG, HDL, CHOLHDL, VLDL, LDLCALC  Current HIV Regimen: Treatment naive  Assessment: Mark Galloway is here today to initiate care with Tammy Sours for his newly diagnosed HIV infection.  He is treatment naive with a HIV viral load of 157,000.  Will start patient on Biktarvy.  Explained that Susanne Borders is a one pill once daily medication with or without food and the importance of not missing any  doses. Explained resistance and how it develops and why it is so important to take Biktarvy daily and not skip days or doses. Counseled patient to take it around the same time each day. Counseled on what to do if dose is missed, if closer to missed dose take immediately, if closer to next dose then skip and resume normal schedule.   Cautioned on possible side effects the first week or so including nausea, diarrhea, dizziness, and headaches but that they should resolve after the first couple of weeks. I reviewed patient medications and found no drug interactions. Counseled patient to separate Biktarvy from divalent cations including multivitamins. Discussed with patient to call clinic if he starts a new medication or herbal  supplement. I gave the patient my card and told him to call me with any issues/questions/concerns.   Plan: - Start Biktarvy - F/u with Tammy Sours in 4 weeks  Berit Raczkowski L. Dilyn Osoria, PharmD, BCIDP, AAHIVP, CPP Clinical Pharmacist Practitioner Infectious Diseases Clinical Pharmacist [facility] 12/01/2020, 12:20 PM

## 2020-12-01 NOTE — Patient Instructions (Signed)
Nice to see you.  We will get you started on Biktarvy and Bactrim.  We will check your lab work today.  Plan for follow-up in 1 month or sooner if needed.  Please let us know if you have any questions or concerns.  Have a great day and stay safe!

## 2020-12-01 NOTE — Assessment & Plan Note (Signed)
Mr. 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 Started on doxycycline in the ED. Will confirm treatment with the Health Department. Recheck RPR in 3 months.

## 2020-12-01 NOTE — Assessment & Plan Note (Signed)
Mr. Mavis is a 34 y/o male with AIDS initially diagnosed on 11/18/18 with risk factors including possible needle stick and cannot rule out sexual contact. No symptoms consistent with acute retroviral syndrome or current symptoms of opportunistic infection. We discussed the pathogenesis, risks if left untreated/progression, and treatment options for HIV. He has [employer] and will get him started on Biktarvy and Bactrim. Provided introduction to the clinic. Will obtain initial B20 lab work today. He met with pharmacy staff to discuss medications and financial counselors for medication assistance. Plan for follow up in 1 month or sooner if needed.
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
## 2020-12-02 LAB — URINE CYTOLOGY ANCILLARY ONLY
Chlamydia: NEGATIVE
Comment: NEGATIVE
Comment: NORMAL
Neisseria Gonorrhea: NEGATIVE

## 2020-12-02 LAB — T-HELPER CELL (CD4) - (RCID CLINIC ONLY)
CD4 % Helper T Cell: 7 % — ABNORMAL LOW (ref 33–65)
CD4 T Cell Abs: 72 /uL — ABNORMAL LOW (ref 400–1790)

## 2020-12-10 LAB — HIV-1 RNA ULTRAQUANT REFLEX TO GENTYP+
HIV 1 RNA Quant: 296000 copies/mL — ABNORMAL HIGH
HIV-1 RNA Quant, Log: 5.47 Log copies/mL — ABNORMAL HIGH

## 2020-12-10 LAB — HLA B*5701: HLA-B*5701 w/rflx HLA-B High: NEGATIVE

## 2020-12-10 LAB — HEPATITIS A ANTIBODY, TOTAL: Hepatitis A AB,Total: NONREACTIVE

## 2020-12-10 LAB — FLUORESCENT TREPONEMAL AB(FTA)-IGG-BLD: Fluorescent Treponemal ABS: REACTIVE — AB

## 2020-12-10 LAB — HEPATITIS C ANTIBODY
Hepatitis C Ab: NONREACTIVE
SIGNAL TO CUT-OFF: 0.42 (ref <1.00)

## 2020-12-10 LAB — HIV-1 GENOTYPE: HIV-1 Genotype: DETECTED — AB

## 2020-12-10 LAB — LIPID PANEL
Cholesterol: 120 mg/dL (ref <200)
HDL: 25 mg/dL — ABNORMAL LOW (ref >=40)
LDL Cholesterol (Calc): 72 mg/dL (calc)
Non-HDL Cholesterol (Calc): 95 mg/dL (calc) (ref <130)
Total CHOL/HDL Ratio: 4.8 (calc) (ref <5.0)
Triglycerides: 150 mg/dL — ABNORMAL HIGH (ref <150)

## 2020-12-10 LAB — RPR: RPR Ser Ql: REACTIVE — AB

## 2020-12-10 LAB — HEPATITIS B SURFACE ANTIGEN: Hepatitis B Surface Ag: NONREACTIVE

## 2020-12-10 LAB — RPR TITER: RPR Titer: 1:2 {titer} — ABNORMAL HIGH

## 2020-12-10 LAB — HEPATITIS B SURFACE ANTIBODY,QUALITATIVE: Hep B S Ab: NONREACTIVE

## 2020-12-10 LAB — GLUCOSE 6 PHOSPHATE DEHYDROGENASE: G-6PDH: 7.8 U/g Hgb (ref 7.0–20.5)

## 2021-01-05 ENCOUNTER — Encounter: Payer: Self-pay | Admitting: Family

## 2021-01-05 ENCOUNTER — Ambulatory Visit (INDEPENDENT_AMBULATORY_CARE_PROVIDER_SITE_OTHER): Payer: Commercial Managed Care - PPO | Admitting: Family

## 2021-01-05 ENCOUNTER — Other Ambulatory Visit: Payer: Self-pay

## 2021-01-05 VITALS — BP 106/66 | HR 67 | Temp 98.0°F | Ht 69.0 in | Wt 196.0 lb

## 2021-01-05 DIAGNOSIS — Z0000 Encounter for general adult medical examination without abnormal findings: Secondary | ICD-10-CM

## 2021-01-05 DIAGNOSIS — B20 Human immunodeficiency virus [HIV] disease: Secondary | ICD-10-CM | POA: Diagnosis not present

## 2021-01-05 NOTE — Progress Notes (Signed)
Subjective:    Patient ID: Mark Galloway, male    DOB: 09-11-1986, 35 y.o.   MRN: [id number]  Chief Complaint  Patient presents with  . HIV Positive/AIDS     HPI:  Mark Galloway is a 35 y.o. male with AIDS who was last seen on 12/01/2020 with newly diagnosed virus.  Initial blood work showed a viral load of 296,000 with CD4 count of 72.  Enters care and CDC stage III.  Genotype was without significant resistance and determined to be wild-type with subtype B.  Started on Biktarvy supplementing with Bactrim for OI prophylaxis.  Here today for first follow-up.  Mark Galloway has been taking his Biktarvy and Bactrim as prescribed.  He has noted nausea without vomiting.  Believes he may have had the symptoms prior to starting medications and believes it may be related to nutritional intake.  Otherwise feeling well today with no new concerns. Denies fevers, chills, night sweats, headaches, changes in vision, neck pain/stiffness, diarrhea, vomiting, lesions or rashes.  Mark Galloway has not had to obtain a refill of his medication.  Denies feelings of being down, depressed, or hopeless recently.  No recreational or illicit drug use, tobacco use, alcohol consumption.  Declines vaccines and condoms.  He is scheduled for routine dental care in the coming weeks.  No Known Allergies    Outpatient Medications Prior to Visit  Medication Sig Dispense Refill  . bictegravir-emtricitabine-tenofovir AF (BIKTARVY) 50-200-25 MG TABS tablet Take 1 tablet by mouth daily. 30 tablet 3  . sulfamethoxazole-trimethoprim (BACTRIM DS) 800-160 MG tablet Take 1 tablet by mouth daily. 30 tablet 3   No facility-administered medications prior to visit.     Past Medical History:  Diagnosis Date  . HIV infection ([name])   . Shingles      History reviewed. No pertinent surgical history.   Review of Systems  Constitutional: Negative for appetite change, chills, fatigue, fever and unexpected weight change.  Eyes:  Negative for visual disturbance.  Respiratory: Negative for cough, chest tightness, shortness of breath and wheezing.   Cardiovascular: Negative for chest pain and leg swelling.  Gastrointestinal: Negative for abdominal pain, constipation, diarrhea, nausea and vomiting.  Genitourinary: Negative for dysuria, flank pain, frequency, genital sores, hematuria and urgency.  Skin: Negative for rash.  Allergic/Immunologic: Negative for immunocompromised state.  Neurological: Negative for dizziness and headaches.      Objective:    BP 106/66   Pulse 67   Temp 98 F (36.7 C)   Ht 5\' 9"  (1.753 m)   Wt 196 lb (88.9 kg)   BMI 28.94 kg/m  Nursing note and vital signs reviewed.  Physical Exam Constitutional:      General: He is not in acute distress.    Appearance: He is well-developed.  HENT:     Mouth/Throat:     Mouth: Oropharynx is clear and moist.  Eyes:     Conjunctiva/sclera: Conjunctivae normal.  Cardiovascular:     Rate and Rhythm: Normal rate and regular rhythm.     Pulses: Intact distal pulses.     Heart sounds: Normal heart sounds. No murmur heard. No friction rub. No gallop.   Pulmonary:     Effort: Pulmonary effort is normal. No respiratory distress.     Breath sounds: Normal breath sounds. No wheezing or rales.  Chest:     Chest wall: No tenderness.  Abdominal:     General: Bowel sounds are normal.     Palpations: Abdomen is soft.  Tenderness: There is no abdominal tenderness.  Musculoskeletal:     Cervical back: Neck supple.  Lymphadenopathy:     Cervical: No cervical adenopathy.  Skin:    General: Skin is warm and dry.     Findings: No rash.  Neurological:     Mental Status: He is alert and oriented to person, place, and time.  Psychiatric:        Mood and Affect: Mood and affect normal.        Behavior: Behavior normal.        Thought Content: Thought content normal.        Judgment: Judgment normal.      No flowsheet data found.     Assessment &  Plan:    Patient Active Problem List   Diagnosis Date Noted  . Healthcare maintenance 01/05/2021  . 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 AIDS (acquired immune deficiency syndrome) ([name]) 03/30/2020  . Psychosis ([name])   . Anxiety   . Severe manic bipolar 1 disorder with psychotic behavior ([name]) 03/25/2020     Problem List Items Addressed This Visit      Other   AIDS (acquired immune deficiency syndrome) ([name]) - Primary    Mark Galloway appears to be doing well following his first month of therapy with Biktarvy supplemented with Bactrim for OI prophylaxis.  He does have nausea which is unclear if it started prior to medication or related to medication.  He wishes to continue with the current regimen for now.  No signs/symptoms of opportunistic infection or progressive HIV disease.  We reviewed previous lab work and discuss plan of care.  Check blood work today.  Continue current dose of Biktarvy supplemented with Bactrim for OI prophylaxis.  Plan for follow-up in 6 weeks or sooner if needed.      Relevant Orders   COMPLETE METABOLIC PANEL WITH GFR   HIV-1 RNA quant-no reflex-bld   T-helper cell (CD4)- ([facility] clinic only)   Healthcare maintenance     Discussed importance of safe sexual practice to reduce the risk of STI.  Condoms declined.  Declines influenza and COVID vaccines.  Will schedule routine dental care independently.         I am having Mark Galloway maintain his bictegravir-emtricitabine-tenofovir AF and sulfamethoxazole-trimethoprim.   Follow-up: Return in about 6 weeks (around 02/16/2021), or if symptoms worsen or fail to improve.   [name], MSN, [name]-C Nurse Practitioner [facility] for Infectious Disease [facility] number: [phone]

## 2021-01-05 NOTE — Assessment & Plan Note (Signed)
   Discussed importance of safe sexual practice to reduce the risk of STI.  Condoms declined.  Declines influenza and COVID vaccines.  Will schedule routine dental care independently.

## 2021-01-05 NOTE — Patient Instructions (Signed)
Nice to see you.  Continue to take your Biktarvy and Bactrim daily as prescribed.   Refills are available at the pharmacy.  Let us know about the nausea.   Plan for follow up in 6 weeks or sooner if needed.   Have a great day and stay safe!  Happy New Year!

## 2021-01-05 NOTE — Assessment & Plan Note (Signed)
Mark Galloway appears to be doing well following his first month of therapy with Biktarvy supplemented with Bactrim for OI prophylaxis.  He does have nausea which is unclear if it started prior to medication or related to medication.  He wishes to continue with the current regimen for now.  No signs/symptoms of opportunistic infection or progressive HIV disease.  We reviewed previous lab work and discuss plan of care.  Check blood work today.  Continue current dose of Biktarvy supplemented with Bactrim for OI prophylaxis.  Plan for follow-up in 6 weeks or sooner if needed.

## 2021-01-06 LAB — T-HELPER CELL (CD4) - (RCID CLINIC ONLY)
CD4 % Helper T Cell: 10 % — ABNORMAL LOW (ref 33–65)
CD4 T Cell Abs: 135 /uL — ABNORMAL LOW (ref 400–1790)

## 2021-01-13 LAB — COMPLETE METABOLIC PANEL WITH GFR
AG Ratio: 0.9 (calc) — ABNORMAL LOW (ref 1.0–2.5)
ALT: 16 U/L (ref 9–46)
AST: 24 U/L (ref 10–40)
Albumin: 4.4 g/dL (ref 3.6–5.1)
Alkaline phosphatase (APISO): 66 U/L (ref 36–130)
BUN: 7 mg/dL (ref 7–25)
CO2: 25 mmol/L (ref 20–32)
Calcium: 9.9 mg/dL (ref 8.6–10.3)
Chloride: 103 mmol/L (ref 98–110)
Creat: 1.01 mg/dL (ref 0.60–1.35)
GFR, Est African American: 113 mL/min/{1.73_m2} (ref >=60)
GFR, Est Non African American: 97 mL/min/{1.73_m2} (ref >=60)
Globulin: 4.8 g/dL (calc) — ABNORMAL HIGH (ref 1.9–3.7)
Glucose, Bld: 85 mg/dL (ref 65–99)
Potassium: 4.6 mmol/L (ref 3.5–5.3)
Sodium: 135 mmol/L (ref 135–146)
Total Bilirubin: 0.3 mg/dL (ref 0.2–1.2)
Total Protein: 9.2 g/dL — ABNORMAL HIGH (ref 6.1–8.1)

## 2021-01-13 LAB — HIV-1 RNA QUANT-NO REFLEX-BLD
HIV 1 RNA Quant: 157 Copies/mL — ABNORMAL HIGH
HIV-1 RNA Quant, Log: 2.2 Log cps/mL — ABNORMAL HIGH

## 2021-02-20 ENCOUNTER — Ambulatory Visit: Payer: Commercial Managed Care - PPO | Admitting: Family

## 2021-04-17 ENCOUNTER — Other Ambulatory Visit: Payer: Self-pay

## 2021-04-17 ENCOUNTER — Emergency Department (HOSPITAL_BASED_OUTPATIENT_CLINIC_OR_DEPARTMENT_OTHER): Payer: Self-pay

## 2021-04-17 ENCOUNTER — Emergency Department (HOSPITAL_BASED_OUTPATIENT_CLINIC_OR_DEPARTMENT_OTHER)
Admission: EM | Admit: 2021-04-17 | Discharge: 2021-04-17 | Disposition: A | Discharge status: Home / Self Care | Payer: Self-pay | Attending: Emergency Medicine | Admitting: Emergency Medicine

## 2021-04-17 ENCOUNTER — Encounter (HOSPITAL_BASED_OUTPATIENT_CLINIC_OR_DEPARTMENT_OTHER): Payer: Self-pay | Admitting: Emergency Medicine

## 2021-04-17 DIAGNOSIS — B20 Human immunodeficiency virus [HIV] disease: Secondary | ICD-10-CM | POA: Diagnosis not present

## 2021-04-17 DIAGNOSIS — S7012XA Contusion of left thigh, initial encounter: Secondary | ICD-10-CM | POA: Diagnosis not present

## 2021-04-17 DIAGNOSIS — S79922A Unspecified injury of left thigh, initial encounter: Secondary | ICD-10-CM | POA: Diagnosis present

## 2021-04-17 DIAGNOSIS — R6884 Jaw pain: Secondary | ICD-10-CM | POA: Diagnosis not present

## 2021-04-17 DIAGNOSIS — M542 Cervicalgia: Secondary | ICD-10-CM | POA: Diagnosis not present

## 2021-04-17 DIAGNOSIS — Y92410 Unspecified street and highway as the place of occurrence of the external cause: Secondary | ICD-10-CM | POA: Diagnosis not present

## 2021-04-17 DIAGNOSIS — Z87891 Personal history of nicotine dependence: Secondary | ICD-10-CM | POA: Insufficient documentation

## 2021-04-17 DIAGNOSIS — S3991XA Unspecified injury of abdomen, initial encounter: Secondary | ICD-10-CM | POA: Diagnosis not present

## 2021-04-17 LAB — URINALYSIS, ROUTINE W REFLEX MICROSCOPIC
Bilirubin Urine: NEGATIVE
Glucose, UA: NEGATIVE mg/dL
Hgb urine dipstick: NEGATIVE
Ketones, ur: NEGATIVE mg/dL
Leukocytes,Ua: NEGATIVE
Nitrite: NEGATIVE
Specific Gravity, Urine: 1.023 (ref 1.005–1.030)
pH: 8 (ref 5.0–8.0)

## 2021-04-17 LAB — COMPREHENSIVE METABOLIC PANEL
ALT: 16 U/L (ref 0–44)
AST: 18 U/L (ref 15–41)
Albumin: 4.1 g/dL (ref 3.5–5.0)
Alkaline Phosphatase: 67 U/L (ref 38–126)
Anion gap: 7 (ref 5–15)
BUN: 9 mg/dL (ref 6–20)
CO2: 25 mmol/L (ref 22–32)
Calcium: 9.6 mg/dL (ref 8.9–10.3)
Chloride: 106 mmol/L (ref 98–111)
Creatinine, Ser: 0.79 mg/dL (ref 0.61–1.24)
GFR, Estimated: >60 mL/min (ref >60)
Glucose, Bld: 108 mg/dL — ABNORMAL HIGH (ref 70–99)
Potassium: 3.8 mmol/L (ref 3.5–5.1)
Sodium: 138 mmol/L (ref 135–145)
Total Bilirubin: 0.4 mg/dL (ref 0.3–1.2)
Total Protein: 8.1 g/dL (ref 6.5–8.1)

## 2021-04-17 LAB — CBC WITH DIFFERENTIAL/PLATELET
Abs Immature Granulocytes: 0.01 10*3/uL (ref 0.00–0.07)
Basophils Absolute: 0 10*3/uL (ref 0.0–0.1)
Basophils Relative: 0 %
Eosinophils Absolute: 0.1 10*3/uL (ref 0.0–0.5)
Eosinophils Relative: 2 %
HCT: 37.9 % — ABNORMAL LOW (ref 39.0–52.0)
Hemoglobin: 12.7 g/dL — ABNORMAL LOW (ref 13.0–17.0)
Immature Granulocytes: 0 %
Lymphocytes Relative: 50 %
Lymphs Abs: 1.7 10*3/uL (ref 0.7–4.0)
MCH: 33.3 pg (ref 26.0–34.0)
MCHC: 33.5 g/dL (ref 30.0–36.0)
MCV: 99.5 fL (ref 80.0–100.0)
Monocytes Absolute: 0.4 10*3/uL (ref 0.1–1.0)
Monocytes Relative: 11 %
Neutro Abs: 1.3 10*3/uL — ABNORMAL LOW (ref 1.7–7.7)
Neutrophils Relative %: 37 %
Platelets: 261 10*3/uL (ref 150–400)
RBC: 3.81 MIL/uL — ABNORMAL LOW (ref 4.22–5.81)
RDW: 12.7 % (ref 11.5–15.5)
WBC: 3.5 10*3/uL — ABNORMAL LOW (ref 4.0–10.5)
nRBC: 0 % (ref 0.0–0.2)

## 2021-04-17 MED ORDER — ACETAMINOPHEN 500 MG PO TABS: 1000.0000 mg | ORAL_TABLET | Freq: Four times a day (QID) | ORAL | 0 refills | Status: DC | PRN

## 2021-04-17 MED ORDER — METHOCARBAMOL 500 MG PO TABS: 500.0000 mg | ORAL_TABLET | Freq: Four times a day (QID) | ORAL | 0 refills | Status: DC | PRN

## 2021-04-17 MED ORDER — IOHEXOL 300 MG/ML  SOLN
100.0000 mL | Freq: Once | INTRAMUSCULAR | Status: AC | PRN
  Administered 2021-04-17: 100 mL via INTRAVENOUS
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
## 2021-05-06 ENCOUNTER — Other Ambulatory Visit: Payer: Self-pay | Admitting: Family

## 2021-05-06 DIAGNOSIS — B20 Human immunodeficiency virus [HIV] disease: Secondary | ICD-10-CM

## 2021-11-30 IMAGING — CT CT ABD-PELV W/ CM
2 of 4 series · 16 of 46 positions shown, 18 images · IV contrast (Omni 300)
Comparison: None.

CLINICAL DATA: Right lower quadrant abdominal pain, HIV positive

EXAM:
CT ABDOMEN AND PELVIS WITH CONTRAST
TECHNIQUE: Multidetector CT imaging of the abdomen and pelvis was performed
using the standard protocol following bolus administration of
intravenous contrast.
CONTRAST:  100mL OMNIPAQUE IOHEXOL 300 MG/ML  SOLN

[Series 3: a/p w/ 5mm · axial · 0.77mm/px · z∈[+752,+1187]mm · 13 of 95 slices shown, 15 images]
[im 4/95  soft-tissue]
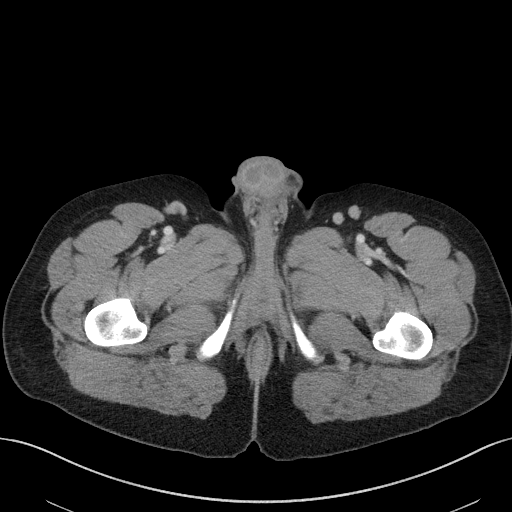
[im 4/95  bone]
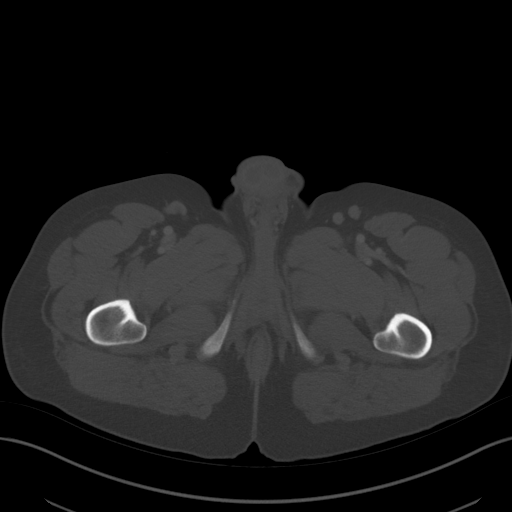
[im 11/95  soft-tissue]
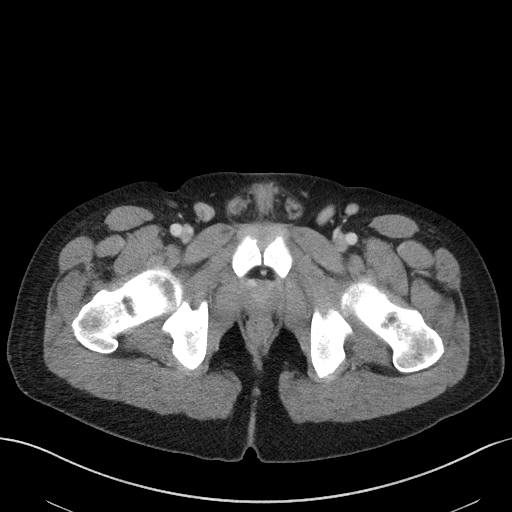
[im 19/95  soft-tissue]
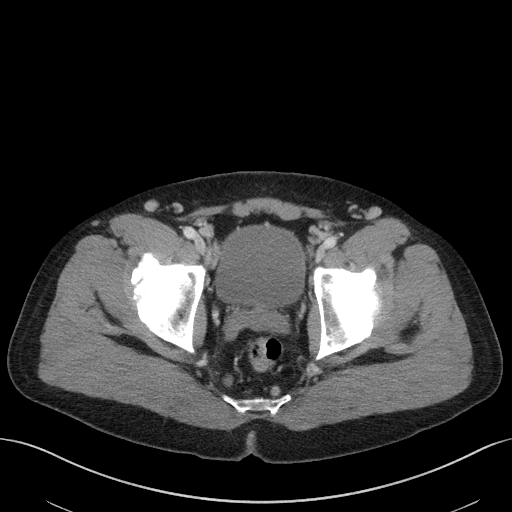
[im 26/95  soft-tissue]
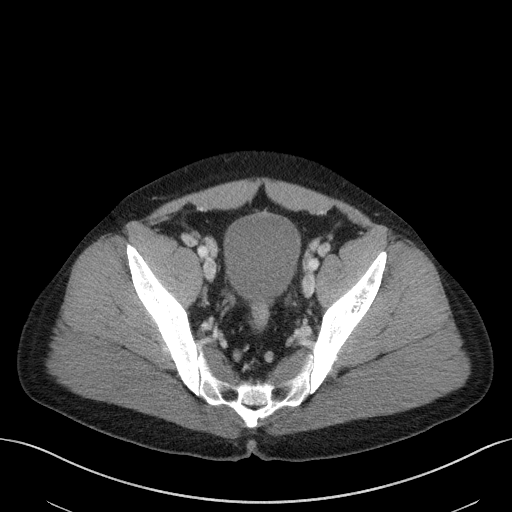
[im 33/95  soft-tissue]
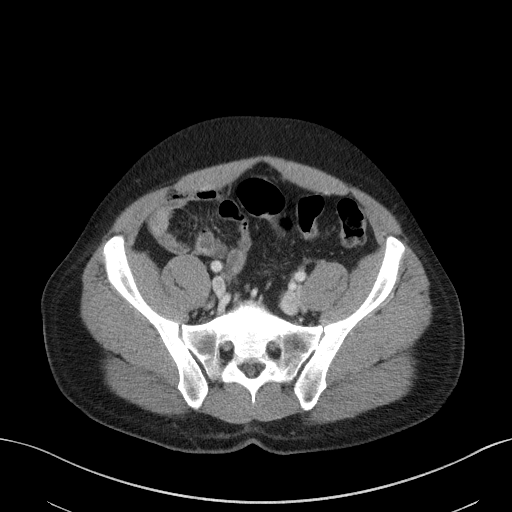
[im 40/95  soft-tissue]
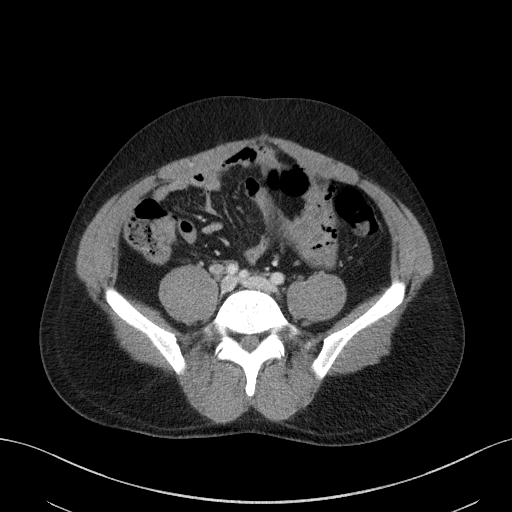
[im 48/95  soft-tissue]
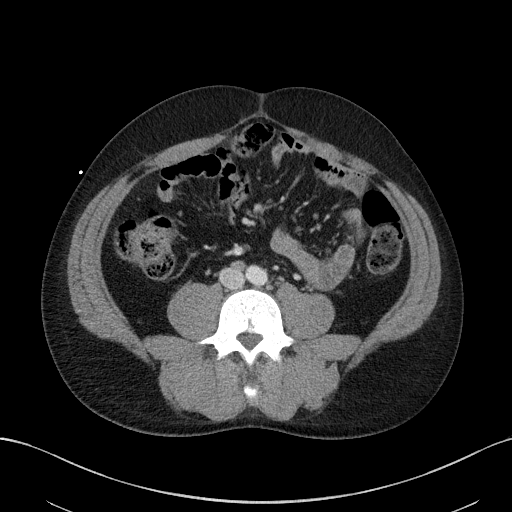
[im 55/95  soft-tissue]
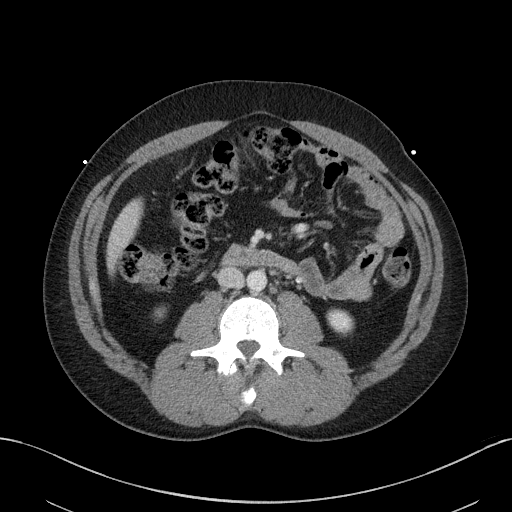
[im 62/95  soft-tissue]
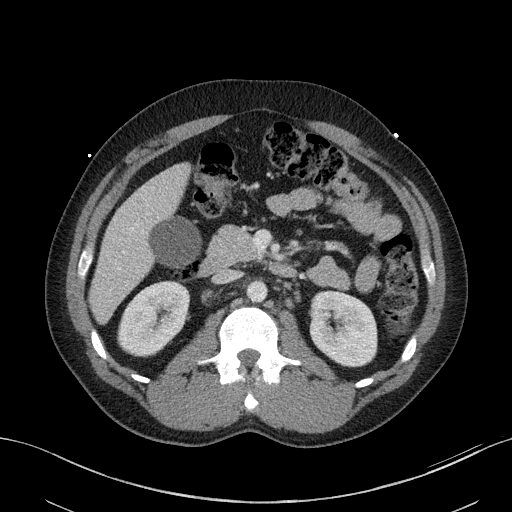
[im 62/95  bone]
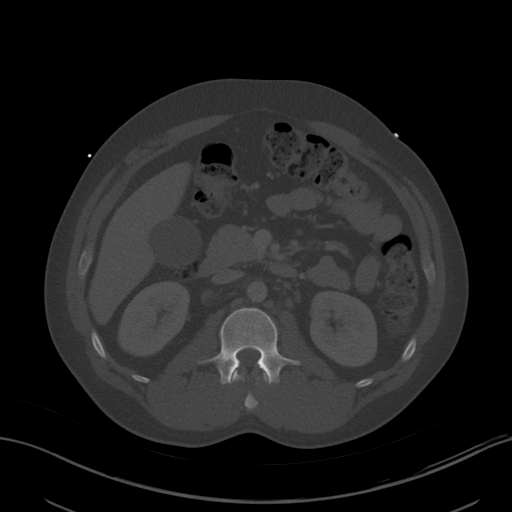
[im 69/95  soft-tissue]
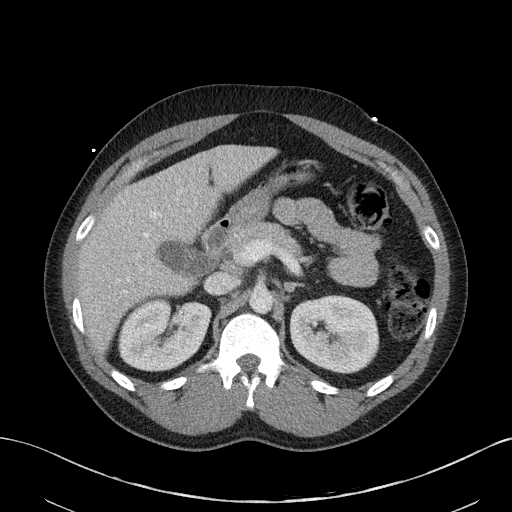
[im 76/95  soft-tissue]
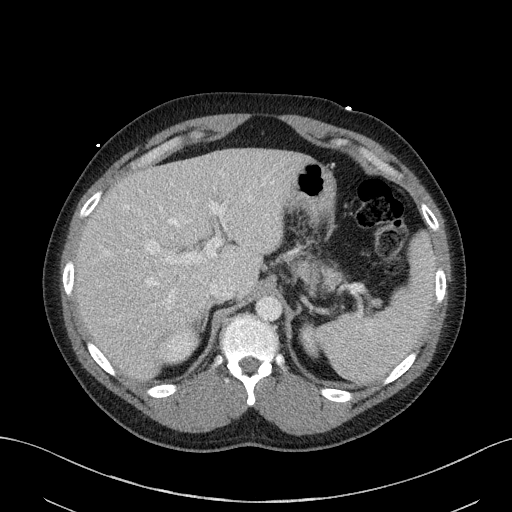
[im 84/95  soft-tissue]
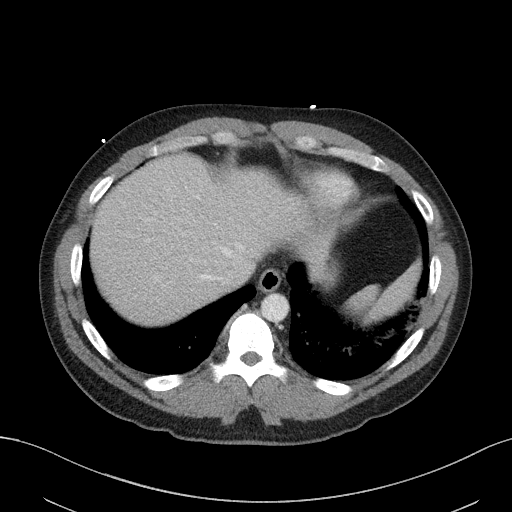
[im 91/95  soft-tissue]
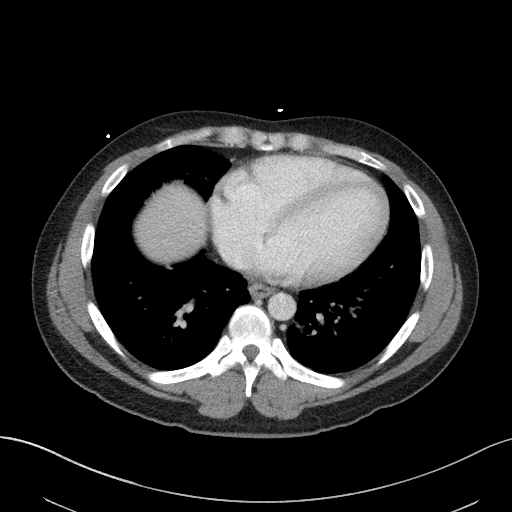

[Series 6: a/p w/ cor · coronal · 0.71mm/px · 3 of 151 slices shown]
[im 51/151  soft-tissue]
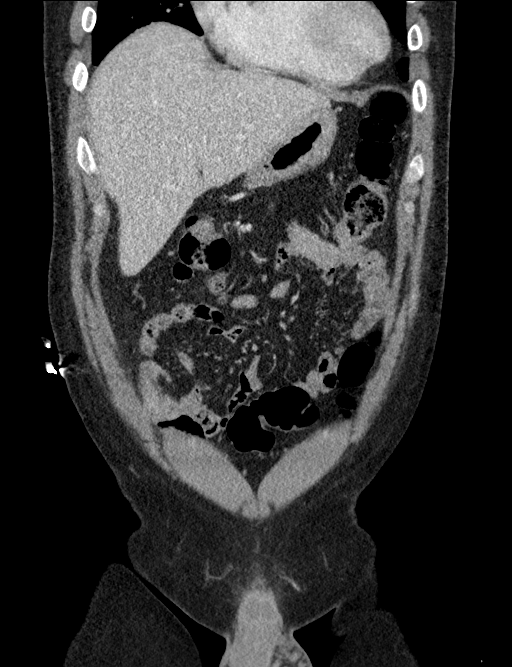
[im 67/151  soft-tissue]
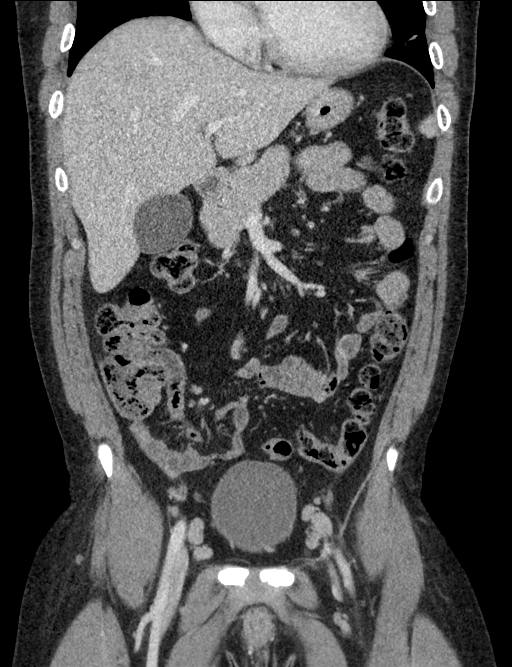
[im 84/151  soft-tissue]
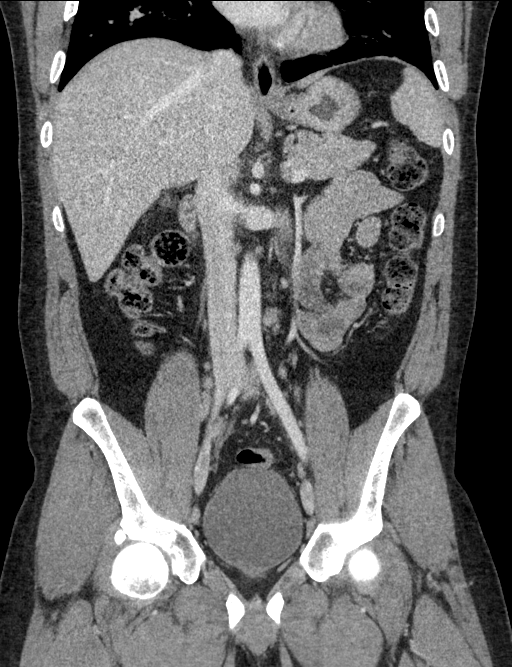

[16 of 46 positions shown; findings below may reference images not displayed]

FINDINGS: Lower chest: Left lower lobe mixed interstitial and airspace opacity
noted compatible with pneumonia. Minor atelectasis in the right
lower lobe. Normal heart size. No pericardial or pleural effusion.

Hepatobiliary: No focal liver abnormality is seen. No gallstones,
gallbladder wall thickening, or biliary dilatation.

Pancreas: Unremarkable. No pancreatic ductal dilatation or
surrounding inflammatory changes.

Spleen: Normal in size without focal abnormality.

Adrenals/Urinary Tract: Adrenal glands are unremarkable. Kidneys are
normal, without renal calculi, focal lesion, or hydronephrosis.
Bladder is unremarkable.

Stomach/Bowel: Stomach is within normal limits. Appendix appears
normal. No evidence of bowel wall thickening, distention, or
inflammatory changes.

Vascular/Lymphatic: Normal caliber aorta. No acute vascular finding.
Mesenteric and renal vasculature all appear patent. No
Chantel process.

Mild diffuse retroperitoneal, pelvic iliac, and inguinal adenopathy
noted. Retroperitoneal lymph nodes have short axis measurements of 1
cm. Bilateral external iliac lymph nodes measure up to 1.5 cm in
short axis. Index inguinal lymph node on the right measures 1.3 cm
in short axis. This can be seen with chronic HIV infection versus
low-grade lymphoproliferative process.

Reproductive: No significant finding by CT

Other: No abdominal wall hernia or abnormality. No abdominopelvic
ascites.

Musculoskeletal: No acute or significant osseous findings.
IMPRESSION: No acute intra-abdominal or pelvic finding by CT.

Left lower lobe mixed interstitial and airspace opacity compatible
with pneumonia.

Mild diffuse retroperitoneal, pelvic iliac, and inguinal adenopathy.
See above comment.

## 2022-10-01 ENCOUNTER — Encounter (HOSPITAL_COMMUNITY): Payer: Self-pay | Admitting: Emergency Medicine

## 2022-10-01 ENCOUNTER — Other Ambulatory Visit: Payer: Self-pay

## 2022-10-01 ENCOUNTER — Emergency Department (HOSPITAL_COMMUNITY)
Admission: EM | Admit: 2022-10-01 | Discharge: 2022-10-02 | Disposition: A | Discharge status: Home / Self Care | Payer: Commercial Managed Care - HMO | Attending: Emergency Medicine | Admitting: Emergency Medicine

## 2022-10-01 DIAGNOSIS — Z21 Asymptomatic human immunodeficiency virus [HIV] infection status: Secondary | ICD-10-CM | POA: Diagnosis not present

## 2022-10-01 DIAGNOSIS — M79672 Pain in left foot: Secondary | ICD-10-CM | POA: Insufficient documentation

## 2022-10-01 DIAGNOSIS — Z046 Encounter for general psychiatric examination, requested by authority: Secondary | ICD-10-CM | POA: Diagnosis present

## 2022-10-01 DIAGNOSIS — Z79899 Other long term (current) drug therapy: Secondary | ICD-10-CM | POA: Diagnosis not present

## 2022-10-01 DIAGNOSIS — F29 Unspecified psychosis not due to a substance or known physiological condition: Secondary | ICD-10-CM | POA: Diagnosis not present

## 2022-10-01 DIAGNOSIS — F310 Bipolar disorder, current episode hypomanic: Secondary | ICD-10-CM | POA: Diagnosis not present

## 2022-10-01 DIAGNOSIS — F989 Unspecified behavioral and emotional disorders with onset usually occurring in childhood and adolescence: Secondary | ICD-10-CM | POA: Diagnosis not present

## 2022-10-01 DIAGNOSIS — F69 Unspecified disorder of adult personality and behavior: Secondary | ICD-10-CM

## 2022-10-01 DIAGNOSIS — G43709 Chronic migraine without aura, not intractable, without status migrainosus: Secondary | ICD-10-CM | POA: Diagnosis not present

## 2022-10-01 HISTORY — DX: Schizophrenia, unspecified: F20.9

## 2022-10-01 HISTORY — DX: Bipolar disorder, unspecified: F31.9

## 2022-10-01 LAB — CBC WITH DIFFERENTIAL/PLATELET
Abs Immature Granulocytes: 0.06 10*3/uL (ref 0.00–0.07)
Basophils Absolute: 0 10*3/uL (ref 0.0–0.1)
Basophils Relative: 0 %
Eosinophils Absolute: 0.2 10*3/uL (ref 0.0–0.5)
Eosinophils Relative: 4 %
HCT: 28.3 % — ABNORMAL LOW (ref 39.0–52.0)
Hemoglobin: 9.4 g/dL — ABNORMAL LOW (ref 13.0–17.0)
Immature Granulocytes: 1 %
Lymphocytes Relative: 25 %
Lymphs Abs: 1.1 10*3/uL (ref 0.7–4.0)
MCH: 33.8 pg (ref 26.0–34.0)
MCHC: 33.2 g/dL (ref 30.0–36.0)
MCV: 101.8 fL — ABNORMAL HIGH (ref 80.0–100.0)
Monocytes Absolute: 0.7 10*3/uL (ref 0.1–1.0)
Monocytes Relative: 14 %
Neutro Abs: 2.6 10*3/uL (ref 1.7–7.7)
Neutrophils Relative %: 56 %
Platelets: 372 10*3/uL (ref 150–400)
RBC: 2.78 MIL/uL — ABNORMAL LOW (ref 4.22–5.81)
RDW: 13.3 % (ref 11.5–15.5)
WBC: 4.6 10*3/uL (ref 4.0–10.5)
nRBC: 0 % (ref 0.0–0.2)

## 2022-10-01 LAB — COMPREHENSIVE METABOLIC PANEL
ALT: 33 U/L (ref 0–44)
AST: 34 U/L (ref 15–41)
Albumin: 2.7 g/dL — ABNORMAL LOW (ref 3.5–5.0)
Alkaline Phosphatase: 53 U/L (ref 38–126)
Anion gap: 10 (ref 5–15)
BUN: 7 mg/dL (ref 6–20)
CO2: 24 mmol/L (ref 22–32)
Calcium: 9.3 mg/dL (ref 8.9–10.3)
Chloride: 103 mmol/L (ref 98–111)
Creatinine, Ser: 0.62 mg/dL (ref 0.61–1.24)
GFR, Estimated: >60 mL/min (ref >60)
Glucose, Bld: 97 mg/dL (ref 70–99)
Potassium: 3.8 mmol/L (ref 3.5–5.1)
Sodium: 137 mmol/L (ref 135–145)
Total Bilirubin: 0.1 mg/dL — ABNORMAL LOW (ref 0.3–1.2)
Total Protein: 7.3 g/dL (ref 6.5–8.1)

## 2022-10-01 LAB — ACETAMINOPHEN LEVEL: Acetaminophen (Tylenol), Serum: <10 ug/mL — ABNORMAL LOW (ref 10–30)

## 2022-10-01 LAB — SALICYLATE LEVEL: Salicylate Lvl: <7 mg/dL — ABNORMAL LOW (ref 7.0–30.0)

## 2022-10-01 LAB — ETHANOL: Alcohol, Ethyl (B): <10 mg/dL (ref <10)

## 2022-10-01 MED ORDER — NICOTINE 21 MG/24HR TD PT24: 21.0000 mg | MEDICATED_PATCH | Freq: Once | TRANSDERMAL | Status: DC
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
## 2022-10-01 NOTE — ED Triage Notes (Signed)
Pt BIB [facility] for psych evaluation; hx of bipolar and schizophrenia; Mother reports she is unable to handle pt behavior; sister was coming tomorrow to take pt to [name] but Mother reports she could not handle him until tomorrow   Per EMS v/s stable at:  112/58 P 112 97% on RA

## 2022-10-02 ENCOUNTER — Emergency Department (HOSPITAL_COMMUNITY)
Admission: EM | Admit: 2022-10-02 | Discharge: 2022-10-02 | Disposition: A | Discharge status: Home / Self Care | Payer: Commercial Managed Care - HMO | Source: Home / Self Care | Attending: Emergency Medicine | Admitting: Emergency Medicine

## 2022-10-02 ENCOUNTER — Encounter (HOSPITAL_COMMUNITY): Payer: Self-pay | Admitting: Emergency Medicine

## 2022-10-02 ENCOUNTER — Other Ambulatory Visit: Payer: Self-pay

## 2022-10-02 ENCOUNTER — Emergency Department (HOSPITAL_COMMUNITY): Payer: Commercial Managed Care - HMO

## 2022-10-02 ENCOUNTER — Ambulatory Visit: Payer: Commercial Managed Care - HMO | Admitting: Family

## 2022-10-02 DIAGNOSIS — G43709 Chronic migraine without aura, not intractable, without status migrainosus: Secondary | ICD-10-CM | POA: Insufficient documentation

## 2022-10-02 DIAGNOSIS — M79672 Pain in left foot: Secondary | ICD-10-CM | POA: Insufficient documentation

## 2022-10-02 DIAGNOSIS — Z21 Asymptomatic human immunodeficiency virus [HIV] infection status: Secondary | ICD-10-CM | POA: Insufficient documentation

## 2022-10-02 LAB — RAPID URINE DRUG SCREEN, HOSP PERFORMED
Amphetamines: NOT DETECTED
Barbiturates: NOT DETECTED
Benzodiazepines: NOT DETECTED
Cocaine: NOT DETECTED
Opiates: NOT DETECTED
Tetrahydrocannabinol: NOT DETECTED

## 2022-10-02 MED ORDER — KETOROLAC TROMETHAMINE 15 MG/ML IJ SOLN: 15.0000 mg | Freq: Once | INTRAMUSCULAR | Status: DC

## 2022-10-02 MED ORDER — NYSTATIN 100000 UNIT/ML MT SUSP: 500000.0000 [IU] | Freq: Four times a day (QID) | OROMUCOSAL | 0 refills | Status: DC

## 2022-10-02 NOTE — BH Assessment (Signed)
Comprehensive Clinical Assessment (CCA) Note  10/02/2022 [facility] [id number]  DISPOSITION: Gave clinical report to [name], NP who determined Pt does not meet criteria for inpatient psychiatric treatment. Recommendation is for Pt to follow up with his current mental health providers in [place]. If he needs additional mental health treatment prior to returning to [facility] [place], recommendation is for Pt to present to [facility]. Notified Mark Breach, PA-C and Mark Dec, RN of recommendation via secure message.  The patient demonstrates the following risk factors for suicide: Chronic risk factors for suicide include: psychiatric disorder of bipolar I disorder and medical illness HIV . Acute risk factors for suicide include: family or marital conflict. Protective factors for this patient include: positive social support, positive therapeutic relationship, hope for the future, and life satisfaction. Considering these factors, the overall suicide risk at this point appears to be low. Patient is appropriate for outpatient follow up.  [facility] from 10/01/2022 in [place] [facility] from 04/17/2021 in [facility] Admission (Discharged) from 03/29/2020 in [facility] No Risk No Risk No Risk      Pt is a 36 year old single male who presents unaccompanied to [facility] via EMS. Pt has a diagnosis of bipolar disorder and Pt had a conflict tonight with his mother and mother's boyfriend over the television being played too loud. Per reports from EMS, Pt's mother could not tolerate Pt's behavior and said his sister was going to take Pt to [facility] [place] in the morning but mother could not handle him until tomorrow. While at [facility] was reported to be calm but at one point became loud, demanding, and insulting to staff because he wanted food from the vending machines.  During assessment, Pt is  eating snacks and appears calm and cooperative. He says he had [place] on his left foot recently and has been experiencing pain. He also endorsed some chest discomfort. Pt says he resides in [place] and is in [place] visiting family. He acknowledges that he and his mother had a conflict tonight. Pt describes his mood recently as "stable" but acknowledges some anxiety and irritability. He says his sleep has been less recently, averaging 4-5 hours per night. He states he is prescribed Trazodone for sleep but has not been taking it regularly because he says it makes his tongue swell. He says he has been overeating recently. He acknowledges racing thoughts but states this is a chronic problem that is currently at baseline. He denies current suicidal ideation or history of suicide attempts. He denies current homicidal ideation or history of physical aggression. He denies auditory or visual hallucinations. He denies paranoid or other delusional thought content. He denies alcohol or other substance use.  Pt identifies moving to a new residence as his primary stressor. He explains he is moving from one place in [place] to another. Pt is diagnosed with HIV. He says he is employed for [employer] in [employer]. He identifies his sister as his primary support and indicates he has friends. He denies history of abuse or trauma. He denies legal problems. He denies access to firearms.   Pt says he sees a Teacher, music and therapist in [place]. He says he is prescribed Depakote, Trazodone, and Risperdal. He says he takes medications as prescribed. His medical record indicates he was psychiatrically hospitalized in March 2021 at [place] [facility] due to a manic episode with bizarre behavior.  Pt is dressed in [place] scrubs, alert  and oriented x4. Pt speaks in a clear tone, at moderate volume and normal pace. Motor behavior appears normal. Eye contact is good. Pt's mood during assessment is euthymic and affect is  congruent with mood. Thought process is coherent and relevant. There is no indication Pt is currently responding to internal stimuli or experiencing delusional thought content.   Regarding mental health concerns, Pt says he would like to speak with a psychiatrist concerning possible side effects of Trazodone. He says he will not be returning to his mother's residence and will be leaving with his sister. He would not give permission to speak with any of his family members.   Chief Complaint:  Chief Complaint  Patient presents with   Psychiatric Evaluation   Visit Diagnosis: F31.0 Bipolar I disorder, most recent episode hypomanic   CCA Screening, Triage and Referral (STR)  Patient Reported Information How did you hear about [place]? Family/Friend  What Is the Reason for Your Visit/Call Today? Pt has a diagnosis of bipolar disorder and came to [facility] via EMS because he had an argument with his mother, who reported to EMS she could not tolerate his behavior. Pt says he recently had Mark on his left foot and is experiencing pain in his foot and chest. He acknowledges he has been anxious and irritable recently with decreased sleep. He denies suicidal ideation, homicidal ideation, psychotic symptoms or substance use.  How Long Has This Been Causing You Problems? <Week  What Do You Feel Would Help You the Most Today? Treatment for Depression or other mood problem; Medication(s)   Have You Recently Had Any Thoughts About Hurting Yourself? No  Are You Planning to Commit Suicide/Harm Yourself At This time? No   Have you Recently Had Thoughts About [place]? No  Are You Planning to Harm Someone at This Time? No  Explanation: No data recorded  Have You Used Any Alcohol or Drugs in the Past 24 Hours? No  How Long Ago Did You Use Drugs or Alcohol? No data recorded What Did You Use and How Much? No data recorded  Do You Currently Have a Therapist/Psychiatrist? Yes  Name of  Therapist/Psychiatrist: Pt reports he has a psychiatrist and therapist in [place] Recently Discharged From Any Office Practice or Programs? No  Explanation of Discharge From Practice/Program: No data recorded    CCA Screening Triage Referral Assessment Type of Contact: Tele-Assessment  Telemedicine Service Delivery: Telemedicine service delivery: This service was provided via telemedicine using a 2-way, interactive audio and video technology  Is this Initial or Reassessment? Initial Assessment  Date Telepsych consult ordered in [facility]:  10/02/22  Time Telepsych consult ordered in [facility]:  0005  Location of Assessment: [facility]  Provider Location: [facility]   Collateral Involvement: Medical record. Pt would not give consent to speak to his family.   Does Patient Have a Stage manager Guardian? No  Legal Guardian Contact Information: No data recorded Copy of Legal Guardianship Form: No data recorded Legal Guardian Notified of Arrival: No data recorded Legal Guardian Notified of Pending Discharge: No data recorded If Minor and Not Living with Parent(s), Who has Custody? NA  Is CPS involved or ever been involved? Never  Is APS involved or ever been involved? Never   Patient Determined To Be At Risk for Harm To Self or Others Based on Review of Patient Reported Information or Presenting Complaint? No  Method: No data recorded Availability of Means: No data recorded  Intent: No data recorded Notification Required: No data recorded Additional Information for Danger to Others Potential: No data recorded Additional Comments for Danger to Others Potential: No data recorded Are There Guns or Other Weapons in Your Home? No data recorded Types of Guns/Weapons: No data recorded Are These Weapons Safely Secured?                            No data recorded Who Could Verify You Are Able To Have These Secured: No data recorded Do You Have any  Outstanding Charges, Pending Court Dates, Parole/Probation? No data recorded Contacted To Inform of Risk of Harm To Self or Others: No data recorded   Does Patient Present under Involuntary Commitment? No  IVC Papers Initial File Date: No data recorded  [place] of Residence: Other (Comment) (Pt reports he lives in [place])   Patient Currently Receiving the Following Services: Individual Therapy; Medication Management   Determination of Need: Emergent (2 hours)   Options For Referral: Inpatient Hospitalization; [facility]; Medication Management; Outpatient Therapy; Facility-Based Crisis     CCA Biopsychosocial Patient Reported Schizophrenia/Schizoaffective Diagnosis in Past: No   Strengths: Pt has family support.   Mental Health Symptoms Depression:   Change in energy/activity; Increase/decrease in appetite; Sleep (too much or little); Irritability   Duration of Depressive symptoms:  Duration of Depressive Symptoms: Less than two weeks   Mania:   Change in energy/activity; Irritability; Racing thoughts   Anxiety:    Tension; Sleep; Irritability   Psychosis:   None   Duration of Psychotic symptoms:    Trauma:   None   Obsessions:   None   Compulsions:   None   Inattention:   N/A   Hyperactivity/Impulsivity:   N/A   Oppositional/Defiant Behaviors:   N/A   Emotional Irregularity:   None   Other Mood/Personality Symptoms:   None    Mental Status Exam Appearance and self-care  Stature:   Average   Weight:   Average weight   Clothing:   -- (Scrubs)   Grooming:   Normal   Cosmetic use:   None   Posture/gait:   Normal   Motor activity:   Not Remarkable   Sensorium  Attention:   Normal   Concentration:   Normal   Orientation:   X5   Recall/memory:   Normal   Affect and Mood  Affect:   Labile   Mood:   Euthymic; Irritable; Anxious   Relating  Eye contact:   Normal   Facial expression:   Responsive    Attitude toward examiner:   Cooperative   Thought and Language  Speech flow:  Normal   Thought content:   Appropriate to Mood and Circumstances   Preoccupation:   None   Hallucinations:   None   Organization:  No data recorded  [employer] of Knowledge:   Average   Intelligence:   Average   Abstraction:   Normal   Judgement:   Fair   Reality Testing:   Adequate   Insight:   Gaps   Decision Making:   Impulsive   Social Functioning  Social Maturity:   Impulsive   Social Judgement:   Heedless   Stress  Stressors:   Family conflict; Illness   Coping Ability:   Normal   Skill Deficits:   None   Supports:   Family; Friends/Service system     Religion: Religion/Spirituality Are You A Religious  Person?: No How Might This Affect Treatment?: NA  Leisure/Recreation: Leisure / Recreation Do You Have Hobbies?: Yes Leisure and Hobbies: Dancing, travel, art exhibits  Exercise/Diet: Exercise/Diet Do You Exercise?: No Have You Gained or Lost A Significant Amount of Weight in the Past Six Months?: No Do You Follow a Special Diet?: No Do You Have Any Trouble Sleeping?: Yes Explanation of Sleeping Difficulties: Pt reports sleeping 4-5 hours per night.   CCA Employment/Education Employment/Work Situation: Employment / Work Situation Employment Situation: Unemployed Patient's Job has Been Impacted by Current Illness: No Has Patient ever Been in Passenger transport manager?: No  Education: Education Is Patient Currently Attending School?: No Last Grade Completed: 21 Did You Nutritional therapist?: Yes What Type of College Degree Do you Have?: Paediatric nurse in business Did You Have An Individualized Education Program (IIEP): No Did You Have Any Difficulty At [employer]?: No Patient's Education Has Been Impacted by Current Illness: No   CCA Family/Childhood History Family and Relationship History: Family history Marital status: Single Does  patient have children?: No  Childhood History:  Childhood History By whom was/is the patient raised?: Mother Did patient suffer any verbal/emotional/physical/sexual abuse as a child?: No Did patient suffer from severe childhood neglect?: No Has patient ever been sexually abused/assaulted/raped as an adolescent or adult?: No Was the patient ever a victim of a crime or a disaster?: No Witnessed domestic violence?: No Has patient been affected by domestic violence as an adult?: No  Child/Adolescent Assessment:     CCA Substance Use Alcohol/Drug Use: Alcohol / Drug Use Pain Medications: Please see MAR Prescriptions: Please see MAR Over the Counter: Please see MAR History of alcohol / drug use?: No history of alcohol / drug abuse Longest period of sobriety (when/how long): Pt denies SA                         ASAM's:  Six Dimensions of Multidimensional Assessment  Dimension 1:  Acute Intoxication and/or Withdrawal Potential:      Dimension 2:  Biomedical Conditions and Complications:      Dimension 3:  Emotional, Behavioral, or Cognitive Conditions and Complications:     Dimension 4:  Readiness to Change:     Dimension 5:  Relapse, Continued use, or Continued Problem Potential:     Dimension 6:  Recovery/Living Environment:     ASAM Severity Score:    ASAM Recommended Level of Treatment:     Substance use Disorder (SUD)    Recommendations for Services/Supports/Treatments:    Discharge Disposition:    DSM5 Diagnoses: Patient Active Problem List   Diagnosis Date Noted   Healthcare maintenance 01/05/2021   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

## 2022-10-02 NOTE — ED Notes (Signed)
TTS in process 

## 2022-10-02 NOTE — Progress Notes (Deleted)
Brief Narrative   Patient ID: Torrion Witter, male    DOB: 1986-06-01, 36 y.o.   MRN: [id number]  Mr. Rogacki is a 36 y/o male with HIV disease diagnosed in November 2019 with risk factor of possible needle stick or sexual contact.  Initial viral load was 21,500 with CD4 count of 122. Genosure with no significant medication resistant mutations.  No history of opportunistic infection.  Entered care at [facility] Stage 3. ART treatment experience with BIktarvy.  Subjective:    No chief complaint on file.   HPI:  Chaim [facility] is a 36 y.o. male with AIDS/HIV last seen on 01/05/21 with good adherence and tolerance to Biktarvy supplemented with Bactrim for OI prophylaxis. Viral load had improved from 296,000 to 157 and CD4 count from 72 to 135.  Kidney function, liver function and electrolytes within normal ranges. Seen in the ED on 10/01/22 for psychiatric evaluation and determined he did not meet criteria for admission. Here today for follow up.    No Known Allergies    Facility-Administered Medications Prior to Visit  Medication Dose Route Frequency Provider Last Rate Last Admin   ketorolac (TORADOL) 15 MG/ML injection 15 mg  15 mg Intramuscular Once Theressa Stamps R, PA       Outpatient Medications Prior to Visit  Medication Sig Dispense Refill   acetaminophen (TYLENOL) 500 MG tablet Take 2 tablets (1,000 mg total) by mouth every 6 (six) hours as needed. 30 tablet 0   amoxicillin-clavulanate (AUGMENTIN) 875-125 MG tablet Take 1 tablet by mouth 2 (two) times daily.     BIKTARVY 50-200-25 MG TABS tablet TAKE 1 TABLET BY MOUTH EVERY DAY (Patient not taking: Reported on 10/02/2022) 30 tablet 0   hydrOXYzine (VISTARIL) 50 MG capsule Take 50 mg by mouth every 6 (six) hours as needed. For anxiety     risperiDONE (RISPERDAL) 0.5 MG tablet Take 0.5 mg by mouth 2 (two) times daily.     traZODone (DESYREL) 50 MG tablet Take 50 mg by mouth at bedtime.       Past Medical History:  Diagnosis Date    Bipolar 1 disorder ([place])    HIV infection ([place])    Schizophrenia ([place])    Shingles      No past surgical history on file.    Review of Systems  Constitutional:  Negative for appetite change, chills, fatigue, fever and unexpected weight change.  Eyes:  Negative for visual disturbance.  Respiratory:  Negative for cough, chest tightness, shortness of breath and wheezing.   Cardiovascular:  Negative for chest pain and leg swelling.  Gastrointestinal:  Negative for abdominal pain, constipation, diarrhea, nausea and vomiting.  Genitourinary:  Negative for dysuria, flank pain, frequency, genital sores, hematuria and urgency.  Skin:  Negative for rash.  Allergic/Immunologic: Negative for immunocompromised state.  Neurological:  Negative for dizziness and headaches.      Objective:    There were no vitals taken for this visit. Nursing note and vital signs reviewed.  Physical Exam Constitutional:      General: He is not in acute distress.    Appearance: He is well-developed.  Eyes:     Conjunctiva/sclera: Conjunctivae normal.  Cardiovascular:     Rate and Rhythm: Normal rate and regular rhythm.     Heart sounds: Normal heart sounds. No murmur heard.    No friction rub. No gallop.  Pulmonary:     Effort: Pulmonary effort is normal. No respiratory distress.     Breath sounds: Normal  breath sounds. No wheezing or rales.  Chest:     Chest wall: No tenderness.  Abdominal:     General: Bowel sounds are normal.     Palpations: Abdomen is soft.     Tenderness: There is no abdominal tenderness.  Musculoskeletal:     Cervical back: Neck supple.  Lymphadenopathy:     Cervical: No cervical adenopathy.  Skin:    General: Skin is warm and dry.     Findings: No rash.  Neurological:     Mental Status: He is alert and oriented to person, place, and time.  Psychiatric:        Behavior: Behavior normal.        Thought Content: Thought content normal.        Judgment: Judgment normal.           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   No orders of the defined types were placed in this encounter.    Follow-up: No follow-ups on file.   Terri Piedra, MSN, [name]-C Nurse Practitioner [facility] [place] number: [phone]

## 2022-10-02 NOTE — ED Notes (Signed)
Patient arrives to unit demanding a sandwich bag from staff; staff advised there are no more bags in department; pt states he needs to go to vending machine, RN advised we are not allowed to let him go to the vending machine at this time; pt stands at desk and demands his belongings to leave; RN stated to patient "I will let the EDP know" Patient then states I need you to do it it now!! RN advised again that "I will let the EDP know.; Patient begins to get belligerent with this RN calling derogatory names. RN contacted [facility] counselor and EDP due to patient being voluntary and begin to gather patient's belongings to allow patient to leave; Patient the states "I'm not going anywhere now!, I'm going to sit right here!" This RN removed self from situation and allowed triage RN to handle care of patient; Patient agreeable to do TTS; Belongings put back in locker. [employer] in unit for security-Monique,RN

## 2022-10-02 NOTE — ED Provider Notes (Signed)
[facility] EMERGENCY DEPARTMENT Provider Note   CSN: [id number] Arrival date & time: 10/02/22  0440     History  Chief Complaint  Patient presents with   Foot Pain    Mark Galloway is a 36 y.o. male with history of schizophrenia, bipolar disorder, HIV (last CD4 count 135), presenting to the ED for left foot pain and migraine headache.  Patient was evaluated yesterday in ED for psychiatric complaint was cleared by TTS.  He states the foot pain is an ongoing issue since an I&D performed approximately 1 week ago after stepping on a piece of glass.  He also reports migraine headache has been persistent for the past 3 to 4 weeks and takes over-the-counter acetaminophen for pain control.  Patient states he was just discharged this morning from ED but did not receive prescription for pain medication.  He also reports taking an antibiotic for his foot and is complaining of oral thrush.  Patient is calm and cooperative in ED.  Denies SI/HI.  Patient is noncompliant with HIV medication.  He states he does have an appointment with infectious disease later today.  The history is provided by the patient.  Foot Pain This is a chronic problem. The current episode started more than 1 week ago. Episode frequency: intermittently. Associated symptoms include headaches. The symptoms are aggravated by walking and standing. The symptoms are relieved by NSAIDs, position and rest. He has tried acetaminophen and rest for the symptoms.       Home Medications Prior to Admission medications   Medication Sig Start Date End Date Taking? Authorizing Provider  acetaminophen (TYLENOL) 500 MG tablet Take 2 tablets (1,000 mg total) by mouth every 6 (six) hours as needed. 04/17/21   Arby Barrette, MD  amoxicillin-clavulanate (AUGMENTIN) 875-125 MG tablet Take 1 tablet by mouth 2 (two) times daily. 09/30/22   [provider]  BIKTARVY 50-200-25 MG TABS tablet TAKE 1 TABLET BY MOUTH EVERY  DAY Patient not taking: Reported on 10/02/2022 05/08/21   Veryl Speak, [name]  hydrOXYzine (VISTARIL) 50 MG capsule Take 50 mg by mouth every 6 (six) hours as needed. For anxiety 09/30/22   [provider]  risperiDONE (RISPERDAL) 0.5 MG tablet Take 0.5 mg by mouth 2 (two) times daily. 09/30/22   [provider]  traZODone (DESYREL) 50 MG tablet Take 50 mg by mouth at bedtime. 09/30/22   [provider]      Allergies    Patient has no known allergies.    Review of Systems   Review of Systems  Constitutional:  Negative for fever.  Musculoskeletal:        L foot pain  Skin:  Negative for color change.  Neurological:  Positive for headaches.  Psychiatric/Behavioral:  Negative for behavioral problems, self-injury and suicidal ideas.   All other systems reviewed and are negative.   Physical Exam Updated Vital Signs BP 106/60 (BP Location: Left Arm)   Pulse 91   Temp 98.3 F (36.8 C) (Oral)   Resp 18   Ht 5\' 9"  (1.753 m)   Wt 77.1 kg   SpO2 100%   BMI 25.10 kg/m  Physical Exam Vitals reviewed.  Constitutional:      Appearance: Normal appearance. He is well-developed and well-groomed. He is not ill-appearing.  Cardiovascular:     Rate and Rhythm: Normal rate and regular rhythm.     Pulses: Normal pulses.     Heart sounds: Normal heart sounds.  Feet:  Right foot:     Skin integrity: Skin integrity normal.     Left foot:     Skin integrity: Callus present. No ulcer, skin breakdown, erythema or warmth.     Comments: L foot callus, evidence of healing I&D Skin:    General: Skin is warm and dry.  Neurological:     Mental Status: He is alert and oriented to person, place, and time.  Psychiatric:        Attention and Perception: Attention and perception normal.        Mood and Affect: Mood and affect normal.        Speech: Speech normal.        Behavior: Behavior normal. Behavior is cooperative.        Thought Content: Thought content normal.         Judgment: Judgment normal.     ED Results / Procedures / Treatments   Labs (all labs ordered are listed, but only abnormal results are displayed) Labs Reviewed - No data to display  EKG None  Radiology DG Foot 2 Views Left  Result Date: 10/02/2022 CLINICAL DATA:  Left-sided foot pain. Dropped glass on foot 6 weeks ago. Laceration that is now infected. EXAM: LEFT FOOT - 2 VIEW COMPARISON:  None Available. FINDINGS: There is no acute fracture or dislocation. Bony alignment is normal. The joint spaces are preserved. There is no erosive or destructive change. The soft tissues are unremarkable. There is no radiopaque foreign body. IMPRESSION: Unremarkable foot radiographs. No acute osseous abnormality or radiopaque foreign body. Electronically Signed   By: Valetta Mole M.D.   On: 10/02/2022 09:24    Procedures Procedures    Medications Ordered in ED Medications  ketorolac (TORADOL) 15 MG/ML injection 15 mg (has no administration in time range)    ED Course/ Medical Decision Making/ A&P                           Medical Decision Making Problems Addressed: Chronic migraine without aura without status migrainosus, not intractable: chronic illness or injury Foot pain, left: chronic illness or injury  Amount and/or Complexity of Data Reviewed External Data Reviewed: labs and notes.    Details: Reviewed infectious disease encounter from 12/2020 and labs, CD4 count. Radiology: ordered and independent interpretation performed. Decision-making details documented in ED Course. ECG/medicine tests:  Decision-making details documented in ED Course.  Risk OTC drugs. Prescription drug management.   Social Determinants of Health:  HIV patient, non-compliant with medications  36 year old male here requesting pain medication for left foot.  Patient had psychiatric evaluation yesterday in ED and was cleared by TTS.  No evidence of psychosis, denies SI/HI.  Has been calm/cooperative  throughout ED visit.  Patient's main concern today is foot pain, headache, and oral thrush from antibiotic use.  X-ray of left foot is reassuring without acute findings or evidence of infection or bony injury.  Patient has appointment with infectious disease later today.  Sent nystatin oral suspension to patient's pharmacy for treatment of oral thrush.  10:06 AM informed by RN that patient eloped from ED after receiving IM Toradol.  Discussed with Dr. Mayra Neer who agrees with assessment and plan.          Final Clinical Impression(s) / ED Diagnoses Final diagnoses:  Foot pain, left  Chronic migraine without aura without status migrainosus, not intractable    Rx / DC Orders ED Discharge Orders     None  Melton Alar R, [place] 10/02/22 1029    Loetta Rough, MD 10/02/22 2145

## 2022-10-02 NOTE — ED Notes (Signed)
TTS started. Call answered. Patient speaking with counselor via video with door closed for privacy.

## 2022-10-02 NOTE — ED Notes (Signed)
PT belongings in locker 4

## 2022-10-02 NOTE — ED Triage Notes (Signed)
Pt was recently discharged and reports he did not receive pain medication or abx for his left sided foot pain; pt reports he dropped glass on his foot x 6 weeks ago while moving and cut his foot that is now infected

## 2022-10-30 ENCOUNTER — Ambulatory Visit: Payer: Commercial Managed Care - HMO | Admitting: Family

## 2022-11-05 ENCOUNTER — Ambulatory Visit: Payer: Commercial Managed Care - HMO | Admitting: Family

## 2022-11-06 ENCOUNTER — Telehealth: Payer: Self-pay

## 2022-11-07 ENCOUNTER — Encounter: Payer: Self-pay | Admitting: Family

## 2022-11-07 ENCOUNTER — Other Ambulatory Visit: Payer: Self-pay

## 2022-11-07 ENCOUNTER — Ambulatory Visit (INDEPENDENT_AMBULATORY_CARE_PROVIDER_SITE_OTHER): Payer: Commercial Managed Care - HMO | Admitting: Family

## 2022-11-07 VITALS — BP 117/71 | HR 95 | Temp 98.4°F | Resp 16 | Ht 69.0 in | Wt 182.0 lb

## 2022-11-07 DIAGNOSIS — B20 Human immunodeficiency virus [HIV] disease: Secondary | ICD-10-CM | POA: Diagnosis not present

## 2022-11-07 DIAGNOSIS — Z0000 Encounter for general adult medical examination without abnormal findings: Secondary | ICD-10-CM

## 2022-11-07 DIAGNOSIS — Z79899 Other long term (current) drug therapy: Secondary | ICD-10-CM | POA: Diagnosis not present

## 2022-11-07 DIAGNOSIS — B370 Candidal stomatitis: Secondary | ICD-10-CM

## 2022-11-07 DIAGNOSIS — Z113 Encounter for screening for infections with a predominantly sexual mode of transmission: Secondary | ICD-10-CM | POA: Diagnosis not present

## 2022-11-07 MED ORDER — FLUCONAZOLE 100 MG PO TABS: 200.0000 mg | ORAL_TABLET | Freq: Every day | ORAL | 0 refills | Status: DC

## 2022-11-07 MED ORDER — SULFAMETHOXAZOLE-TRIMETHOPRIM 800-160 MG PO TABS: 1.0000 | ORAL_TABLET | Freq: Every day | ORAL | 3 refills | Status: DC

## 2022-11-07 MED ORDER — BIKTARVY 50-200-25 MG PO TABS: 1.0000 | ORAL_TABLET | Freq: Every day | ORAL | 4 refills | Status: DC

## 2022-11-07 NOTE — Progress Notes (Signed)
Brief Narrative   Patient ID: Mark Galloway, male    DOB: Apr 30, 1986, 36 y.o.   MRN: [id number]  [name] is a 36 y/o AA male diagnosed with HIV-1 disease in November 2019 with risk factor of exposure to body fluid and possible needle stick. Initial viral load was 21,500 with CD4 count of 122. Genosure with no significant medication resistant mutations.Thrush with no other opportunistic infection. [id number] negative. Started on [place].   Subjective:    Chief Complaint  Patient presents with   Follow-up    B20 - c/o thrush x 2 months. Pt reports he is taking Nystatin - helps a little.     HPI:  Mark Galloway is a 36 y.o. male with AIDS last seen on 01/05/21 following the first month of Biktarvy with viral load of 157 and CD4 count 135. Continued on Biktarvy and Bactrim at the time. Recently seen in the ED at [facility] for left foot pain and thrush and was started on Nystatin. Here today to re-engage in care.  [name] has been living in [name] until about 1 month ago and has been off medication since May of this year. Had been taking the Biktarvy maybe once per week on average. Has insurance coverage through [place]. No problems when taking Biktarvy. Continues to have issues with thrush with no throat pain or difficulty swallowing. Not currently working and seeking employment.  Housing and access to food are stable. Condoms and STD testing offered. Declines vaccinations.   Denies fevers, chills, night sweats, headaches, changes in vision, neck pain/stiffness, nausea, diarrhea, vomiting, lesions or rashes.  No Known Allergies    Outpatient Medications Prior to Visit  Medication Sig Dispense Refill   hydrOXYzine (VISTARIL) 50 MG capsule Take 50 mg by mouth every 6 (six) hours as needed. For anxiety     nystatin (MYCOSTATIN) 100000 UNIT/ML suspension Take 5 mLs (500,000 Units total) by mouth 4 (four) times daily. 60 mL 0   risperiDONE (RISPERDAL) 0.5 MG tablet Take 0.5 mg by mouth 2  (two) times daily.     traZODone (DESYREL) 50 MG tablet Take 50 mg by mouth at bedtime.     acetaminophen (TYLENOL) 500 MG tablet Take 2 tablets (1,000 mg total) by mouth every 6 (six) hours as needed. (Patient not taking: Reported on 11/07/2022) 30 tablet 0   amoxicillin-clavulanate (AUGMENTIN) 875-125 MG tablet Take 1 tablet by mouth 2 (two) times daily. (Patient not taking: Reported on 11/07/2022)     BIKTARVY 50-200-25 MG TABS tablet TAKE 1 TABLET BY MOUTH EVERY DAY (Patient not taking: Reported on 10/02/2022) 30 tablet 0   No facility-administered medications prior to visit.     Past Medical History:  Diagnosis Date   Bipolar 1 disorder ([place])    HIV infection ([place])    Schizophrenia ([place])    Shingles      History reviewed. No pertinent surgical history.    Review of Systems  Constitutional:  Negative for appetite change, chills, fatigue, fever and unexpected weight change.  HENT:         Positive for oral thrush  Eyes:  Negative for visual disturbance.  Respiratory:  Negative for cough, chest tightness, shortness of breath and wheezing.   Cardiovascular:  Negative for chest pain and leg swelling.  Gastrointestinal:  Negative for abdominal pain, constipation, diarrhea, nausea and vomiting.  Genitourinary:  Negative for dysuria, flank pain, frequency, genital sores, hematuria and urgency.  Skin:  Negative for rash.  Allergic/Immunologic: Negative for  immunocompromised state.  Neurological:  Negative for dizziness and headaches.      Objective:    BP 117/71   Pulse 95   Temp 98.4 F (36.9 C) (Oral)   Resp 16   Ht 5\' 9"  (1.753 m)   Wt 182 lb (82.6 kg)   SpO2 99%   BMI 26.88 kg/m  Nursing note and vital signs reviewed.  Physical Exam Constitutional:      General: He is not in acute distress.    Appearance: He is well-developed.  HENT:     Mouth/Throat:     Comments: Numerous white patches consistent with thrush.  Eyes:     Conjunctiva/sclera: Conjunctivae  normal.  Cardiovascular:     Rate and Rhythm: Normal rate and regular rhythm.     Heart sounds: Normal heart sounds. No murmur heard.    No friction rub. No gallop.  Pulmonary:     Effort: Pulmonary effort is normal. No respiratory distress.     Breath sounds: Normal breath sounds. No wheezing or rales.  Chest:     Chest wall: No tenderness.  Abdominal:     General: Bowel sounds are normal.     Palpations: Abdomen is soft.     Tenderness: There is no abdominal tenderness.  Musculoskeletal:     Cervical back: Neck supple.  Lymphadenopathy:     Cervical: No cervical adenopathy.  Skin:    General: Skin is warm and dry.     Findings: No rash.  Neurological:     Mental Status: He is alert and oriented to person, place, and time.  Psychiatric:        Behavior: Behavior normal.        Thought Content: Thought content normal.        Judgment: Judgment normal.         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 Stairs continues to have oropharyngeal thrush related to poorly controlled AIDS and being immune suppressed. Start fluconazole. Starting back on Biktarvy. Additional treatment as needed if symptoms worsen or do not improve.       Relevant Medications   sulfamethoxazole-trimethoprim (BACTRIM DS) 800-160 MG tablet   bictegravir-emtricitabine-tenofovir AF (BIKTARVY) 50-200-25 MG TABS tablet   fluconazole (DIFLUCAN) 100 MG tablet     Other   AIDS (acquired immune deficiency  syndrome) ([name]) - Primary    [name] has poorly controlled virus with course complicated by development of thrush. Discussed importance of taking medication as prescribed and avoiding taking it sporadically to reduce risk of disease progression, complications or medication resistance. Check lab work today including Genosure. Start Bactrim for OI prophylaxis as previous CD4 count was 130. Plan for follow up in 1 month or sooner if needed with lab work on the same day.       Relevant Medications   sulfamethoxazole-trimethoprim (BACTRIM DS) 800-160 MG  tablet   bictegravir-emtricitabine-tenofovir AF (BIKTARVY) 50-200-25 MG TABS tablet   fluconazole (DIFLUCAN) 100 MG tablet   Other Relevant Orders   HIV RNA, RTPCR W/R GT (RTI, PI,INT)   T-helper cell (CD4)- ([facility] clinic only)   Comprehensive metabolic panel   CBC with Differential/Platelet   Healthcare maintenance    Discussed importance of safe sexual practice and condom use. Condoms and STD testing offered.  Declines vaccinations.       Other Visit Diagnoses     Screening for STDs (sexually transmitted diseases)       Relevant Orders   RPR   Pharmacologic therapy       Relevant Orders   Lipid panel        I have discontinued Elkanah Bolyard's amoxicillin-clavulanate. I have also changed his Biktarvy. Additionally, I am having him start on sulfamethoxazole-trimethoprim and fluconazole. Lastly, I am having him maintain his acetaminophen, hydrOXYzine, risperiDONE, traZODone, and nystatin.   Meds ordered this encounter  Medications   sulfamethoxazole-trimethoprim (BACTRIM DS) 800-160 MG tablet    Sig: Take 1 tablet by mouth daily.    Dispense:  30 tablet    Refill:  3    Order Specific Question:   Supervising Provider    Answer:   Baxter Flattery, CYNTHIA [[id number]]   bictegravir-emtricitabine-tenofovir AF (BIKTARVY) 50-200-25 MG TABS tablet    Sig: Take 1 tablet by mouth daily.    Dispense:  30 tablet    Refill:  4    Order Specific  Question:   Supervising Provider    Answer:   Baxter Flattery, CYNTHIA [[id number]]   fluconazole (DIFLUCAN) 100 MG tablet    Sig: Take 2 tablets (200 mg total) by mouth daily.    Dispense:  20 tablet    Refill:  0    Order Specific Question:   Supervising Provider    Answer:   Carlyle Basques [[id number]]     Follow-up: Return in about 1 month (around 12/07/2022), or if symptoms worsen or fail to improve.   Terri Piedra, MSN, [name]-C Nurse Practitioner [facility] [place] number: [phone]

## 2022-11-07 NOTE — Patient Instructions (Signed)
Nice to see you.  We will check your lab work today.  Restart taking your medication daily as prescribed.  Refills have been sent to the pharmacy.  Plan for follow up in 1 months or sooner if needed with lab work on the same day.  Have a great day and stay safe!

## 2022-11-07 NOTE — Assessment & Plan Note (Signed)
Discussed importance of safe sexual practice and condom use. Condoms and STD testing offered.  Declines vaccinations.  

## 2022-11-07 NOTE — Assessment & Plan Note (Signed)
Mark Galloway continues to have oropharyngeal thrush related to poorly controlled AIDS and being immune suppressed. Start fluconazole. Starting back on Biktarvy. Additional treatment as needed if symptoms worsen or do not improve.

## 2022-11-07 NOTE — Assessment & Plan Note (Signed)
Mark Galloway has poorly controlled virus with course complicated by development of thrush. Discussed importance of taking medication as prescribed and avoiding taking it sporadically to reduce risk of disease progression, complications or medication resistance. Check lab work today including Genosure. Start Bactrim for OI prophylaxis as previous CD4 count was 130. Plan for follow up in 1 month or sooner if needed with lab work on the same day.

## 2022-11-08 ENCOUNTER — Ambulatory Visit: Payer: Commercial Managed Care - HMO | Admitting: Family

## 2022-11-08 LAB — T-HELPER CELL (CD4) - (RCID CLINIC ONLY)
CD4 % Helper T Cell: 6 % — ABNORMAL LOW (ref 33–65)
CD4 T Cell Abs: 54 /uL — ABNORMAL LOW (ref 400–1790)

## 2022-11-09 ENCOUNTER — Telehealth: Payer: Self-pay

## 2022-11-09 NOTE — Telephone Encounter (Signed)
-----   Message from Veryl Speak, [name] sent at 11/09/2022 12:08 PM EST ----- Please inform Mr. Summons that he is positive for Syphilis. He will need 3 weekly injections of 2.4 million units of Bicillin.

## 2022-11-09 NOTE — Telephone Encounter (Signed)
Called patient to relay results and schedule treatment, call could not be completed at this time.   Sandie Ano, RN

## 2022-11-12 NOTE — Telephone Encounter (Signed)
Attempted to contact patient and call could not be completed.

## 2022-11-13 NOTE — Telephone Encounter (Signed)
Third attempt to reach patient, call cannot be completed. Referral faxed to [facility] to assist with contact and treatment.   Sandie Ano, RN

## 2022-11-16 LAB — COMPREHENSIVE METABOLIC PANEL
AG Ratio: 0.8 (calc) — ABNORMAL LOW (ref 1.0–2.5)
ALT: 13 U/L (ref 9–46)
AST: 22 U/L (ref 10–40)
Albumin: 3.9 g/dL (ref 3.6–5.1)
Alkaline phosphatase (APISO): 65 U/L (ref 36–130)
BUN: 10 mg/dL (ref 7–25)
CO2: 26 mmol/L (ref 20–32)
Calcium: 9 mg/dL (ref 8.6–10.3)
Chloride: 105 mmol/L (ref 98–110)
Creat: 0.65 mg/dL (ref 0.60–1.26)
Globulin: 4.7 g/dL (calc) — ABNORMAL HIGH (ref 1.9–3.7)
Glucose, Bld: 85 mg/dL (ref 65–99)
Potassium: 4.1 mmol/L (ref 3.5–5.3)
Sodium: 136 mmol/L (ref 135–146)
Total Bilirubin: 0.2 mg/dL (ref 0.2–1.2)
Total Protein: 8.6 g/dL — ABNORMAL HIGH (ref 6.1–8.1)

## 2022-11-16 LAB — CBC WITH DIFFERENTIAL/PLATELET
Absolute Monocytes: 515 cells/uL (ref 200–950)
Basophils Absolute: 11 cells/uL (ref 0–200)
Basophils Relative: 0.3 %
Eosinophils Absolute: 207 cells/uL (ref 15–500)
Eosinophils Relative: 5.9 %
HCT: 33 % — ABNORMAL LOW (ref 38.5–50.0)
Hemoglobin: 11.1 g/dL — ABNORMAL LOW (ref 13.2–17.1)
Lymphs Abs: 1260 cells/uL (ref 850–3900)
MCH: 33.8 pg — ABNORMAL HIGH (ref 27.0–33.0)
MCHC: 33.6 g/dL (ref 32.0–36.0)
MCV: 100.6 fL — ABNORMAL HIGH (ref 80.0–100.0)
MPV: 10.7 fL (ref 7.5–12.5)
Monocytes Relative: 14.7 %
Neutro Abs: 1509 cells/uL (ref 1500–7800)
Neutrophils Relative %: 43.1 %
Platelets: 178 10*3/uL (ref 140–400)
RBC: 3.28 10*6/uL — ABNORMAL LOW (ref 4.20–5.80)
RDW: 13.1 % (ref 11.0–15.0)
Total Lymphocyte: 36 %
WBC: 3.5 10*3/uL — ABNORMAL LOW (ref 3.8–10.8)

## 2022-11-16 LAB — HIV RNA, RTPCR W/R GT (RTI, PI,INT)
HIV 1 RNA Quant: 300000 copies/mL — ABNORMAL HIGH
HIV-1 RNA Quant, Log: 5.48 Log copies/mL — ABNORMAL HIGH

## 2022-11-16 LAB — HIV-1 INTEGRASE GENOTYPE

## 2022-11-16 LAB — RPR TITER: RPR Titer: 1:64 {titer} — ABNORMAL HIGH

## 2022-11-16 LAB — LIPID PANEL
Cholesterol: 97 mg/dL (ref <200)
HDL: 33 mg/dL — ABNORMAL LOW (ref >=40)
LDL Cholesterol (Calc): 40 mg/dL (calc)
Non-HDL Cholesterol (Calc): 64 mg/dL (calc) (ref <130)
Total CHOL/HDL Ratio: 2.9 (calc) (ref <5.0)
Triglycerides: 161 mg/dL — ABNORMAL HIGH (ref <150)

## 2022-11-16 LAB — RPR: RPR Ser Ql: REACTIVE — AB

## 2022-11-16 LAB — HIV-1 GENOTYPE: HIV-1 Genotype: DETECTED — AB

## 2022-11-16 LAB — FLUORESCENT TREPONEMAL AB(FTA)-IGG-BLD: Fluorescent Treponemal ABS: REACTIVE — AB

## 2023-01-21 NOTE — Telephone Encounter (Signed)
error 

## 2023-03-08 ENCOUNTER — Ambulatory Visit (INDEPENDENT_AMBULATORY_CARE_PROVIDER_SITE_OTHER): Payer: Self-pay | Admitting: Family

## 2023-03-08 ENCOUNTER — Other Ambulatory Visit: Payer: Self-pay

## 2023-03-08 ENCOUNTER — Encounter: Payer: Self-pay | Admitting: Family

## 2023-03-08 ENCOUNTER — Telehealth: Payer: Self-pay

## 2023-03-08 VITALS — BP 112/75 | HR 102 | Temp 98.7°F | Wt 154.6 lb

## 2023-03-08 DIAGNOSIS — Z113 Encounter for screening for infections with a predominantly sexual mode of transmission: Secondary | ICD-10-CM

## 2023-03-08 DIAGNOSIS — R197 Diarrhea, unspecified: Secondary | ICD-10-CM

## 2023-03-08 DIAGNOSIS — A528 Late syphilis, latent: Secondary | ICD-10-CM

## 2023-03-08 DIAGNOSIS — B20 Human immunodeficiency virus [HIV] disease: Secondary | ICD-10-CM | POA: Diagnosis not present

## 2023-03-08 DIAGNOSIS — Z0000 Encounter for general adult medical examination without abnormal findings: Secondary | ICD-10-CM

## 2023-03-08 MED ORDER — PENICILLIN G BENZATHINE 1200000 UNIT/2ML IM SUSY
1.2000 10*6.[IU] | PREFILLED_SYRINGE | Freq: Once | INTRAMUSCULAR | Status: AC
  Administered 2023-03-08: 1.2 10*6.[IU] via INTRAMUSCULAR

## 2023-03-08 MED ORDER — BIKTARVY 50-200-25 MG PO TABS: 1.0000 | ORAL_TABLET | Freq: Every day | ORAL | 3 refills | Status: DC

## 2023-03-08 MED ORDER — SULFAMETHOXAZOLE-TRIMETHOPRIM 800-160 MG PO TABS: 1.0000 | ORAL_TABLET | Freq: Every day | ORAL | 3 refills | Status: DC

## 2023-03-08 NOTE — Assessment & Plan Note (Signed)
Lo has had diarrhea and suspect this is likely related to his poorly controlled AIDS virus. Has appointment with GI in a few weeks. Restart ART therapy to see if symptoms improve.

## 2023-03-08 NOTE — Assessment & Plan Note (Signed)
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 LP ordered. Will start treatment with Bicillin for now and will need 3 weekly injections of 2.4 million units of Bicillin IM for total of 7.2 million units as he was previously untreated. Await LP results to determine need for neurosyphilis.

## 2023-03-08 NOTE — Assessment & Plan Note (Addendum)
Mark Galloway continues to have poorly controlled virus secondary to being off medications. Reviewed lab work and discussed that he is at increased risk for opportunistic infection as evidenced by his last CD4 count in November being 38. Has had headaches on and off and will check cryptococcal antigen although suspect it likely will be negative. Check lab work today. Restart Biktarvy and Bactrim. Not an ideal candidate at this time for [place] but possibly in the future if logistics can be worked out. May need financial assistance pending insurance. Plan for follow up in 1 month or sooner if needed.

## 2023-03-08 NOTE — Telephone Encounter (Signed)
Per Elna Breslow, [name] reached out to IR to schedule appointment for lumbar puncture. Patient would prefer Friday appointment due to schedule conflict.  Left message with IR to schedule appointment. Will call patient once scheduled.   Leatrice Jewels, RMA

## 2023-03-08 NOTE — Progress Notes (Signed)
Brief Narrative   Patient ID: Mark Galloway, male    DOB: 21-Nov-1986, 37 y.o.   MRN: [id number]  Mark Galloway is a 37 y/o AA male diagnosed with HIV-1 disease in November 2019 with risk factor of exposure to body fluid and possible needle stick. Initial viral load was 21,500 with CD4 count of 122. Genosure with no significant medication resistant mutations.Thrush with no other opportunistic infection. [id number] negative. Started on [place].    Subjective:    Chief Complaint  Patient presents with   Follow-up    Needs refills/ off medication/ cramps and digestive issues since off meds/ diarrhea     HPI:  Mark Galloway is a 37 y.o. male with HIV/AIDS last seen on 11/07/22 with poorly controlled virus secondary to being off medication for several months with viral load of 300,000 and CD4 count of 54. Genotype with no significant medication resistant mutations. Continued on Biktarvy and started on Bactrim for OI prophylaxis. Treated for thrust with fluconazole. Lab work was positive for syphilis with titer of 1:64 and was not able to be reached for treatment. Here today for follow up.  Mark Galloway has been doing okay since his last office visit and has been having gastrointestinal issues specifically with diarrhea and occasional cramping. Has been off medication since he was last seen. Has questions about Cabenuva as an alternative. Has occasional headaches and denies changes in vision. Condoms and STD testing offered. Currently has insurance coverage and may be losing coverage as he lost his employment.   Denies fevers, chills, night sweats, changes in vision, neck pain/stiffness, nausea, vomiting, lesions or rashes.    No Known Allergies    Outpatient Medications Prior to Visit  Medication Sig Dispense Refill   fluconazole (DIFLUCAN) 100 MG tablet Take 2 tablets (200 mg total) by mouth daily. 20 tablet 0   hydrOXYzine (VISTARIL) 50 MG capsule Take 50 mg by mouth every 6 (six) hours as  needed. For anxiety     nystatin (MYCOSTATIN) 100000 UNIT/ML suspension Take 5 mLs (500,000 Units total) by mouth 4 (four) times daily. 60 mL 0   risperiDONE (RISPERDAL) 0.5 MG tablet Take 0.5 mg by mouth 2 (two) times daily.     acetaminophen (TYLENOL) 500 MG tablet Take 2 tablets (1,000 mg total) by mouth every 6 (six) hours as needed. (Patient not taking: Reported on 03/08/2023) 30 tablet 0   traZODone (DESYREL) 50 MG tablet Take 50 mg by mouth at bedtime. (Patient not taking: Reported on 03/08/2023)     bictegravir-emtricitabine-tenofovir AF (BIKTARVY) 50-200-25 MG TABS tablet Take 1 tablet by mouth daily. (Patient not taking: Reported on 03/08/2023) 30 tablet 4   sulfamethoxazole-trimethoprim (BACTRIM DS) 800-160 MG tablet Take 1 tablet by mouth daily. (Patient not taking: Reported on 03/08/2023) 30 tablet 3   No facility-administered medications prior to visit.     Past Medical History:  Diagnosis Date   Bipolar 1 disorder ([place])    HIV infection ([place])    Schizophrenia ([place])    Shingles      History reviewed. No pertinent surgical history.    Review of Systems  Constitutional:  Negative for appetite change, chills, fatigue, fever and unexpected weight change.  Eyes:  Negative for visual disturbance.  Respiratory:  Negative for cough, chest tightness, shortness of breath and wheezing.   Cardiovascular:  Negative for chest pain and leg swelling.  Gastrointestinal:  Positive for diarrhea. Negative for abdominal pain, constipation, nausea and vomiting.  Genitourinary:  Negative for dysuria, flank  pain, frequency, genital sores, hematuria and urgency.  Skin:  Negative for rash.  Allergic/Immunologic: Negative for immunocompromised state.  Neurological:  Positive for headaches. Negative for dizziness.      Objective:    BP 112/75   Pulse (!) 102   Temp 98.7 F (37.1 C) (Oral)   Wt 154 lb 9.6 oz (70.1 kg)   SpO2 100%   BMI 22.83 kg/m  Nursing note and vital signs  reviewed.  Physical Exam Constitutional:      General: He is not in acute distress.    Appearance: He is well-developed.  Eyes:     Conjunctiva/sclera: Conjunctivae normal.  Cardiovascular:     Rate and Rhythm: Normal rate and regular rhythm.     Heart sounds: Normal heart sounds. No murmur heard.    No friction rub. No gallop.  Pulmonary:     Effort: Pulmonary effort is normal. No respiratory distress.     Breath sounds: Normal breath sounds. No wheezing or rales.  Chest:     Chest wall: No tenderness.  Abdominal:     General: Bowel sounds are normal.     Palpations: Abdomen is soft.     Tenderness: There is no abdominal tenderness.  Musculoskeletal:     Cervical back: Neck supple.  Lymphadenopathy:     Cervical: No cervical adenopathy.  Skin:    General: Skin is warm and dry.     Findings: No rash.  Neurological:     Mental Status: He is alert and oriented to person, place, and time.  Psychiatric:        Behavior: Behavior normal.        Thought Content: Thought content normal.        Judgment: Judgment normal.         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) - Primary    Mark Galloway continues to have poorly controlled virus secondary to being off medications. Reviewed lab work and discussed that he is at increased risk for opportunistic infection as evidenced by  his last CD4 count in November being 54. Has had headaches on and off and will check cryptococcal antigen although suspect it likely will be negative. Check lab work today. Restart Biktarvy and Bactrim. Not an ideal candidate at this time for [place] but possibly in the future if logistics can be worked out. May need financial assistance pending insurance. Plan for follow up in 1 month or sooner if needed.       Relevant Medications   bictegravir-emtricitabine-tenofovir AF (BIKTARVY) 50-200-25 MG TABS tablet   sulfamethoxazole-trimethoprim (BACTRIM DS) 800-160 MG tablet   Other Relevant Orders   COMPLETE METABOLIC PANEL WITH GFR   HIV RNA, RTPCR W/R GT (RTI, PI,INT)   Cryptococcal Ag, Ltx  Scr Rflx Titer   T-helper cells (CD4) count (not at [facility])   Healthcare maintenance    Discussed importance of safe sexual practice and condom use. Condoms and STD testing offered.  Discuss vaccinations as next office visit.       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 LP ordered. Will start treatment with Bicillin for now and will need 3 weekly injections of 2.4 million units of Bicillin IM for total of 7.2 million units as he was previously untreated. Await LP results to determine need for neurosyphilis.       Relevant Medications   bictegravir-emtricitabine-tenofovir AF (BIKTARVY) 50-200-25 MG TABS tablet   sulfamethoxazole-trimethoprim (BACTRIM DS) 800-160 MG tablet   Other Relevant Orders   RPR   DG FL GUIDED LUMBAR PUNCTURE   Diarrhea    Mark Galloway has had diarrhea and suspect this is likely related to his poorly controlled AIDS virus. Has appointment with GI in a few weeks. Restart ART therapy to see if symptoms improve.       Other Visit Diagnoses     Screening for STDs (sexually transmitted diseases)       Relevant Orders   CT/NG RNA, TMA Rectal   C. trachomatis/N. gonorrhoeae RNA    GC/CT Probe, Amp (Throat)        I have discontinued [employer]. I am also having him start on Biktarvy. Additionally, I am having him maintain his acetaminophen, hydrOXYzine, risperiDONE, traZODone, nystatin, fluconazole, and sulfamethoxazole-trimethoprim. We administered penicillin g benzathine and penicillin g benzathine.   Meds ordered this encounter  Medications   bictegravir-emtricitabine-tenofovir AF (BIKTARVY) 50-200-25 MG TABS tablet    Sig: Take 1 tablet by mouth daily.    Dispense:  30 tablet    Refill:  3    Order Specific Question:   Supervising Provider    Answer:   Baxter Flattery, CYNTHIA [[id number]]   sulfamethoxazole-trimethoprim (BACTRIM DS) 800-160 MG tablet    Sig: Take 1 tablet by mouth daily.    Dispense:  30 tablet    Refill:  3    Order Specific Question:   Supervising Provider    Answer:   Baxter Flattery, CYNTHIA [[id number]]   penicillin g benzathine (BICILLIN LA) 1200000 UNIT/2ML injection 1.2 Million Units    Order Specific Question:   Antibiotic Indication:    Answer:   Syphilis   penicillin g benzathine (BICILLIN LA) 1200000 UNIT/2ML injection 1.2 Million Units    Order Specific Question:   Antibiotic Indication:    Answer:   Syphilis     Follow-up: Return in about 1 month (around 04/08/2023), or if symptoms worsen or fail to improve.   Terri Piedra, MSN, [name]-C Nurse Practitioner [facility] for [facility] [name] number: [phone]

## 2023-03-08 NOTE — Assessment & Plan Note (Addendum)
Discussed importance of safe sexual practice and condom use. Condoms and STD testing offered.  Discuss vaccinations as next office visit.

## 2023-03-08 NOTE — Patient Instructions (Signed)
Nice to see you.  We will check your lab work today.  Restart taking your medication daily as prescribed.  Refills have been sent to the pharmacy.  They will call to schedule you an appointment for your procedure  Please schedule weekly nurse visits for the next 2 weeks for medication administration.   Plan for follow up in 1 months or sooner if needed with lab work on the same day.  Have a great day and stay safe!

## 2023-03-09 LAB — GC/CHLAMYDIA PROBE, AMP (THROAT)
Chlamydia trachomatis RNA: NOT DETECTED
Neisseria gonorrhoeae RNA: NOT DETECTED

## 2023-03-09 LAB — CT/NG RNA, TMA RECTAL
Chlamydia Trachomatis RNA: DETECTED — AB
Neisseria Gonorrhoeae RNA: NOT DETECTED

## 2023-03-09 LAB — C. TRACHOMATIS/N. GONORRHOEAE RNA
C. trachomatis RNA, TMA: NOT DETECTED
N. gonorrhoeae RNA, TMA: NOT DETECTED

## 2023-03-11 ENCOUNTER — Telehealth: Payer: Self-pay

## 2023-03-11 DIAGNOSIS — A749 Chlamydial infection, unspecified: Secondary | ICD-10-CM

## 2023-03-11 NOTE — Telephone Encounter (Signed)
Left voicemail with patient requesting call back to review appointment information for lumbar puncture. Left voicemail. Leatrice Jewels, RMA

## 2023-03-11 NOTE — Telephone Encounter (Signed)
Patient scheduled for Lumbar puncture 3/22 at 1 o clock at [facility]. Will need to check in by 1230 at admissions (main entrance).  Will need to eat light 4 hours prior to appointment.  [id number]  Leatrice Jewels, RMA

## 2023-03-11 NOTE — Telephone Encounter (Signed)
-----   Message from Golden Circle, [name] sent at 03/11/2023 11:29 AM EDT ----- Please inform Mark Galloway that his lab work was positive for chlamydia and will need doxycyline 100 mg bid for 7 days. Please send to pharmacy of choice. Thanks.

## 2023-03-11 NOTE — Telephone Encounter (Signed)
Called patient to relay results and need for treatment, no answer.   Will also need to relay information regarding LP from a separate phone note.   Beryle Flock, RN

## 2023-03-12 MED ORDER — DOXYCYCLINE HYCLATE 100 MG PO TABS: 100.0000 mg | ORAL_TABLET | Freq: Two times a day (BID) | ORAL | 0 refills | Status: AC

## 2023-03-12 NOTE — Telephone Encounter (Signed)
Called patient and relayed results for pos STD. Patient would like doxy sent to [facility] on [address]. Understands he needs to refrain from ALL sexual activity until treated. Will need to inform recent partners to get tested/ treated.  Leatrice Jewels, RMA

## 2023-03-12 NOTE — Telephone Encounter (Signed)
Spoke with patient and relayed appointment information for LP. Does not have any questions at this time.  Leatrice Jewels, RMA

## 2023-03-12 NOTE — Addendum Note (Signed)
Addended by: Leatrice Jewels on: 03/12/2023 08:35 AM   Modules accepted: Orders

## 2023-03-15 ENCOUNTER — Other Ambulatory Visit: Payer: Self-pay

## 2023-03-15 ENCOUNTER — Ambulatory Visit: Payer: Commercial Managed Care - HMO | Admitting: Family Medicine

## 2023-03-15 ENCOUNTER — Other Ambulatory Visit (HOSPITAL_COMMUNITY): Payer: Self-pay

## 2023-03-15 ENCOUNTER — Ambulatory Visit (INDEPENDENT_AMBULATORY_CARE_PROVIDER_SITE_OTHER): Payer: Commercial Managed Care - HMO

## 2023-03-15 DIAGNOSIS — A528 Late syphilis, latent: Secondary | ICD-10-CM | POA: Diagnosis not present

## 2023-03-15 MED ORDER — PENICILLIN G BENZATHINE 1200000 UNIT/2ML IM SUSY
2.4000 10*6.[IU] | PREFILLED_SYRINGE | Freq: Once | INTRAMUSCULAR | Status: AC
  Administered 2023-03-15: 2.4 10*6.[IU] via INTRAMUSCULAR

## 2023-03-20 LAB — RPR: RPR Ser Ql: REACTIVE — AB

## 2023-03-20 LAB — COMPLETE METABOLIC PANEL WITH GFR
AG Ratio: 0.8 (calc) — ABNORMAL LOW (ref 1.0–2.5)
ALT: 21 U/L (ref 9–46)
AST: 22 U/L (ref 10–40)
Albumin: 3.8 g/dL (ref 3.6–5.1)
Alkaline phosphatase (APISO): 72 U/L (ref 36–130)
BUN: 10 mg/dL (ref 7–25)
CO2: 27 mmol/L (ref 20–32)
Calcium: 9.6 mg/dL (ref 8.6–10.3)
Chloride: 100 mmol/L (ref 98–110)
Creat: 0.71 mg/dL (ref 0.60–1.26)
Globulin: 5 g/dL (calc) — ABNORMAL HIGH (ref 1.9–3.7)
Glucose, Bld: 94 mg/dL (ref 65–99)
Potassium: 3.9 mmol/L (ref 3.5–5.3)
Sodium: 136 mmol/L (ref 135–146)
Total Bilirubin: 0.2 mg/dL (ref 0.2–1.2)
Total Protein: 8.8 g/dL — ABNORMAL HIGH (ref 6.1–8.1)
eGFR: 122 mL/min/{1.73_m2} (ref >=60)

## 2023-03-20 LAB — RPR TITER: RPR Titer: 1:32 {titer} — ABNORMAL HIGH

## 2023-03-20 LAB — HIV-1 INTEGRASE GENOTYPE

## 2023-03-20 LAB — HIV RNA, RTPCR W/R GT (RTI, PI,INT)
HIV 1 RNA Quant: 413000 copies/mL — ABNORMAL HIGH
HIV-1 RNA Quant, Log: 5.62 Log copies/mL — ABNORMAL HIGH

## 2023-03-20 LAB — T PALLIDUM AB: T Pallidum Abs: POSITIVE — AB

## 2023-03-20 LAB — CRYPTOCOCCAL AG, LTX SCR RFLX TITER
Cryptococcal Ag Screen: NOT DETECTED
MICRO NUMBER:: 14671263
SPECIMEN QUALITY:: ADEQUATE

## 2023-03-20 LAB — HIV-1 GENOTYPE: HIV-1 Genotype: DETECTED — AB

## 2023-03-20 LAB — T-HELPER CELLS (CD4) COUNT (NOT AT ARMC)
Absolute CD4: 70 cells/uL — ABNORMAL LOW (ref 490–1740)
CD4 T Helper %: 4 % — ABNORMAL LOW (ref 30–61)
Total lymphocyte count: 1771 cells/uL (ref 850–3900)

## 2023-03-22 ENCOUNTER — Other Ambulatory Visit: Payer: Self-pay | Admitting: Family

## 2023-03-22 ENCOUNTER — Ambulatory Visit (HOSPITAL_COMMUNITY)
Admission: RE | Admit: 2023-03-22 | Discharge: 2023-03-22 | Disposition: A | Discharge status: Home / Self Care | Payer: Commercial Managed Care - HMO | Source: Ambulatory Visit | Attending: Family | Admitting: Family

## 2023-03-22 ENCOUNTER — Other Ambulatory Visit: Payer: Self-pay

## 2023-03-22 ENCOUNTER — Ambulatory Visit (INDEPENDENT_AMBULATORY_CARE_PROVIDER_SITE_OTHER): Payer: Commercial Managed Care - HMO

## 2023-03-22 ENCOUNTER — Encounter (HOSPITAL_COMMUNITY): Payer: Self-pay

## 2023-03-22 DIAGNOSIS — A530 Latent syphilis, unspecified as early or late: Secondary | ICD-10-CM | POA: Diagnosis present

## 2023-03-22 DIAGNOSIS — A528 Late syphilis, latent: Secondary | ICD-10-CM

## 2023-03-22 LAB — CSF CELL COUNT WITH DIFFERENTIAL
Eosinophils, CSF: 0 % (ref 0–1)
Lymphs, CSF: 93 % — ABNORMAL HIGH (ref 40–80)
Monocyte-Macrophage-Spinal Fluid: 6 % — ABNORMAL LOW (ref 15–45)
RBC Count, CSF: 14 /mm3 — ABNORMAL HIGH
Segmented Neutrophils-CSF: 1 % (ref 0–6)
Tube #: 1
WBC, CSF: 11 /mm3 (ref 0–5)

## 2023-03-22 LAB — PROTEIN AND GLUCOSE, CSF
Glucose, CSF: 46 mg/dL (ref 40–70)
Total  Protein, CSF: 40 mg/dL (ref 15–45)

## 2023-03-22 MED ORDER — PENICILLIN G BENZATHINE 1200000 UNIT/2ML IM SUSY
2.4000 10*6.[IU] | PREFILLED_SYRINGE | Freq: Once | INTRAMUSCULAR | Status: AC
  Administered 2023-03-22: 2.4 10*6.[IU] via INTRAMUSCULAR

## 2023-03-22 MED ORDER — LIDOCAINE HCL (PF) 1 % IJ SOLN
5.0000 mL | Freq: Once | INTRAMUSCULAR | Status: AC
  Administered 2023-03-22: 2 mL via INTRADERMAL

## 2023-03-22 NOTE — Progress Notes (Signed)
Received call from IR tech who states lab called and reported WBC 11 in CSF that was collected from LP today. Information was relayed to Mauricio Po, [name].     Narda Rutherford, AGNP-BC 03/22/2023, 3:12 PM

## 2023-03-22 NOTE — Procedures (Signed)
Technically successful fluoro guided LP at L4-L5 level with opening pressure of 14 cm H2O. No closing pressure obtained.  9 cc of clear, colorless CSF sent to lab for analysis.  No immediate post procedural complication.  Please see imaging section of Epic for full dictation.    Narda Rutherford, AGNP-BC 03/22/2023, 12:41 PM

## 2023-03-25 ENCOUNTER — Telehealth: Payer: Self-pay | Admitting: Family

## 2023-03-25 DIAGNOSIS — R9089 Other abnormal findings on diagnostic imaging of central nervous system: Secondary | ICD-10-CM

## 2023-03-25 DIAGNOSIS — B20 Human immunodeficiency virus [HIV] disease: Secondary | ICD-10-CM

## 2023-03-25 LAB — CSF CULTURE W GRAM STAIN: Culture: NO GROWTH

## 2023-03-25 LAB — PATHOLOGIST SMEAR REVIEW: Path Review: INCREASED

## 2023-03-25 NOTE — Telephone Encounter (Signed)
Spoke with Mark Galloway regarding his lab work and imaging results. Awaiting VDRL for neurosyphilis. Overall feeling better since he was last seen. Will check MRI brain for thoroughness. Follow up pending VDRL lab work.

## 2023-03-26 LAB — VDRL, CSF: VDRL Quant, CSF: NONREACTIVE

## 2023-03-27 ENCOUNTER — Telehealth: Payer: Self-pay

## 2023-03-27 NOTE — Telephone Encounter (Signed)
Left voicemail asking patient to return my call.   Tysheka Fanguy P Alayna Mabe, CMA  

## 2023-03-27 NOTE — Telephone Encounter (Signed)
-----   Message from Golden Circle, [place] sent at 03/27/2023 10:07 AM EDT ----- Please inform Mr. Keding that his CSF was negative for syphilis and he should be good with the injections he has already received. Would like to continue with the MRI just for thoroughness like we discussed.

## 2023-03-28 NOTE — Telephone Encounter (Signed)
Second attempt to reach patient, no answer. Left HIPAA compliant voicemail requesting callback.   Jaynell Castagnola D Marin Milley, RN  

## 2023-03-30 ENCOUNTER — Ambulatory Visit (HOSPITAL_COMMUNITY): Admission: RE | Admit: 2023-03-30 | Payer: Commercial Managed Care - HMO | Source: Ambulatory Visit

## 2023-04-01 NOTE — Telephone Encounter (Signed)
Third attempt to reach patient, unable to leave message.   Beryle Flock, RN

## 2023-04-02 NOTE — Telephone Encounter (Signed)
Fourth attempt to reach patient, call could not be completed on either number. Sending letter to address on file.   Beryle Flock, RN

## 2023-04-13 ENCOUNTER — Emergency Department (HOSPITAL_COMMUNITY): Payer: Self-pay

## 2023-04-13 ENCOUNTER — Encounter (HOSPITAL_COMMUNITY): Payer: Self-pay | Admitting: *Deleted

## 2023-04-13 ENCOUNTER — Emergency Department (HOSPITAL_COMMUNITY)
Admission: EM | Admit: 2023-04-13 | Discharge: 2023-04-13 | Discharge status: Against Medical Advice | Payer: Self-pay | Attending: Emergency Medicine | Admitting: Emergency Medicine

## 2023-04-13 ENCOUNTER — Other Ambulatory Visit: Payer: Self-pay

## 2023-04-13 DIAGNOSIS — D649 Anemia, unspecified: Secondary | ICD-10-CM | POA: Insufficient documentation

## 2023-04-13 DIAGNOSIS — R519 Headache, unspecified: Secondary | ICD-10-CM | POA: Insufficient documentation

## 2023-04-13 DIAGNOSIS — R000 Tachycardia, unspecified: Secondary | ICD-10-CM | POA: Insufficient documentation

## 2023-04-13 DIAGNOSIS — Z1152 Encounter for screening for COVID-19: Secondary | ICD-10-CM | POA: Insufficient documentation

## 2023-04-13 DIAGNOSIS — R079 Chest pain, unspecified: Secondary | ICD-10-CM | POA: Insufficient documentation

## 2023-04-13 DIAGNOSIS — E876 Hypokalemia: Secondary | ICD-10-CM | POA: Insufficient documentation

## 2023-04-13 DIAGNOSIS — Z21 Asymptomatic human immunodeficiency virus [HIV] infection status: Secondary | ICD-10-CM | POA: Insufficient documentation

## 2023-04-13 DIAGNOSIS — R0602 Shortness of breath: Secondary | ICD-10-CM | POA: Insufficient documentation

## 2023-04-13 HISTORY — DX: Type 2 diabetes mellitus without complications: E11.9

## 2023-04-13 LAB — HEPATIC FUNCTION PANEL
ALT: 13 U/L (ref 0–44)
AST: 23 U/L (ref 15–41)
Albumin: 3.3 g/dL — ABNORMAL LOW (ref 3.5–5.0)
Alkaline Phosphatase: 68 U/L (ref 38–126)
Bilirubin, Direct: <0.1 mg/dL (ref 0.0–0.2)
Total Bilirubin: 0.5 mg/dL (ref 0.3–1.2)
Total Protein: 8.2 g/dL — ABNORMAL HIGH (ref 6.5–8.1)

## 2023-04-13 LAB — RESP PANEL BY RT-PCR (RSV, FLU A&B, COVID)  RVPGX2
Influenza A by PCR: NEGATIVE
Influenza B by PCR: NEGATIVE
Resp Syncytial Virus by PCR: NEGATIVE
SARS Coronavirus 2 by RT PCR: NEGATIVE

## 2023-04-13 LAB — CBC WITH DIFFERENTIAL/PLATELET
Abs Immature Granulocytes: 0 10*3/uL (ref 0.00–0.07)
Basophils Absolute: 0 10*3/uL (ref 0.0–0.1)
Basophils Relative: 0 %
Eosinophils Absolute: 0 10*3/uL (ref 0.0–0.5)
Eosinophils Relative: 0 %
HCT: 34.8 % — ABNORMAL LOW (ref 39.0–52.0)
Hemoglobin: 11.1 g/dL — ABNORMAL LOW (ref 13.0–17.0)
Lymphocytes Relative: 24 %
Lymphs Abs: 1.3 10*3/uL (ref 0.7–4.0)
MCH: 32.4 pg (ref 26.0–34.0)
MCHC: 31.9 g/dL (ref 30.0–36.0)
MCV: 101.5 fL — ABNORMAL HIGH (ref 80.0–100.0)
Monocytes Absolute: 0.4 10*3/uL (ref 0.1–1.0)
Monocytes Relative: 8 %
Neutro Abs: 3.6 10*3/uL (ref 1.7–7.7)
Neutrophils Relative %: 68 %
Platelets: 355 10*3/uL (ref 150–400)
RBC: 3.43 MIL/uL — ABNORMAL LOW (ref 4.22–5.81)
RDW: 14.9 % (ref 11.5–15.5)
WBC: 5.3 10*3/uL (ref 4.0–10.5)
nRBC: 0 % (ref 0.0–0.2)
nRBC: 0 /100 WBC

## 2023-04-13 LAB — BASIC METABOLIC PANEL
Anion gap: 10 (ref 5–15)
BUN: 7 mg/dL (ref 6–20)
CO2: 23 mmol/L (ref 22–32)
Calcium: 9.3 mg/dL (ref 8.9–10.3)
Chloride: 102 mmol/L (ref 98–111)
Creatinine, Ser: 0.77 mg/dL (ref 0.61–1.24)
GFR, Estimated: >60 mL/min (ref >60)
Glucose, Bld: 89 mg/dL (ref 70–99)
Potassium: 3.1 mmol/L — ABNORMAL LOW (ref 3.5–5.1)
Sodium: 135 mmol/L (ref 135–145)

## 2023-04-13 LAB — TROPONIN I (HIGH SENSITIVITY)
Troponin I (High Sensitivity): 3 ng/L (ref <18)
Troponin I (High Sensitivity): 4 ng/L (ref <18)

## 2023-04-13 LAB — MAGNESIUM: Magnesium: 2 mg/dL (ref 1.7–2.4)

## 2023-04-13 LAB — D-DIMER, QUANTITATIVE: D-Dimer, Quant: 0.4 ug/mL-FEU (ref 0.00–0.50)

## 2023-04-13 LAB — CK: Total CK: 110 U/L (ref 49–397)

## 2023-04-13 LAB — LIPASE, BLOOD: Lipase: 24 U/L (ref 11–51)

## 2023-04-13 MED ORDER — GADOBUTROL 1 MMOL/ML IV SOLN
7.5000 mL | Freq: Once | INTRAVENOUS | Status: AC | PRN
  Administered 2023-04-13: 7.5 mL via INTRAVENOUS

## 2023-04-13 MED ORDER — DIPHENHYDRAMINE HCL 50 MG/ML IJ SOLN
12.5000 mg | Freq: Once | INTRAMUSCULAR | Status: AC
  Administered 2023-04-13: 12.5 mg via INTRAVENOUS
  Filled 2023-04-13: qty 1

## 2023-04-13 MED ORDER — PROCHLORPERAZINE EDISYLATE 10 MG/2ML IJ SOLN
10.0000 mg | Freq: Once | INTRAMUSCULAR | Status: AC
  Administered 2023-04-13: 10 mg via INTRAVENOUS
  Filled 2023-04-13: qty 2

## 2023-04-13 MED ORDER — POTASSIUM CHLORIDE CRYS ER 20 MEQ PO TBCR
40.0000 meq | EXTENDED_RELEASE_TABLET | Freq: Once | ORAL | Status: AC
  Administered 2023-04-13: 40 meq via ORAL
  Filled 2023-04-13: qty 2

## 2023-04-13 NOTE — ED Notes (Signed)
Pt eloped from ED with no notification to staff. Pt unable to be located and did not answer cell phone after elopement.

## 2023-04-13 NOTE — ED Notes (Signed)
Attempted to call pt's cell phone number to get him to come back to the ED and have his IV removed. Pt did not answer phone.

## 2023-04-13 NOTE — ED Triage Notes (Signed)
Patient presents to ed via [facility] states he is having chest pain and sob with generalized joint pain . Patient is alert oriented

## 2023-04-13 NOTE — ED Notes (Signed)
Pt not in room at this time. Unable to be located in the ED. No staff members witnessed pt leaving room.

## 2023-04-13 NOTE — ED Provider Notes (Signed)
[place] EMERGENCY DEPARTMENT AT [facility] Provider Note   CSN: [id number] Arrival date & time: 04/13/23  0410     History  Chief Complaint  Patient presents with   Chest Pain    Mark Galloway is a 37 y.o. male with a past medical history significant for HIV/AIDS diagnosed in November 2019 labs and ID care and was recently reestablished currently on medication, schizophrenia, diabetes, and anxiety who presents to the ED due to intermittent right-sided, stabbing chest pain that started last night.  Patient states chest pain occurs every 20 minutes.  Chest pain worse with exertion.  Also endorses some shortness of breath with chest pain.  Patient also endorses myalgias, headache, and fatigue which are chronic in nature for patient.  Patient states has been going on for the past 2-3 years.  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  Finishes month supply of doxycycline few days ago.  Patient had an LP on 3/22 which was negative for syphilis. Patient also had an MRI scheduled due to chronic headaches.  Patient notes he has had a previous episode of chest pain in the past where he was diagnosed with pneumonia and he notes this feels similar.  Admits to a low-grade fever of 99 F.  No cough.  Patient also notes he has had a previous DVT in [facility]; however was never placed on any anticoagulant? Unable to find information in chart.    History obtained from patient and past medical records. No interpreter used during encounter.        Home Medications Prior to Admission medications   Medication Sig Start Date End Date Taking? Authorizing Provider  acetaminophen (TYLENOL) 500 MG tablet Take 2 tablets (1,000 mg total) by mouth every 6 (six) hours as needed. Patient not taking: Reported on 03/08/2023 04/17/21   Arby Barrette, MD  bictegravir-emtricitabine-tenofovir AF (BIKTARVY) 50-200-25 MG TABS tablet Take 1 tablet by mouth daily. 03/08/23   Veryl Speak, [name]   fluconazole (DIFLUCAN) 100 MG tablet Take 2 tablets (200 mg total) by mouth daily. 11/07/22   Veryl Speak, [name]  hydrOXYzine (VISTARIL) 50 MG capsule Take 50 mg by mouth every 6 (six) hours as needed. For anxiety 09/30/22   [provider]  nystatin (MYCOSTATIN) 100000 UNIT/ML suspension Take 5 mLs (500,000 Units total) by mouth 4 (four) times daily. 10/02/22   Clark, Meghan R, PA-C  risperiDONE (RISPERDAL) 0.5 MG tablet Take 0.5 mg by mouth 2 (two) times daily. 09/30/22   [provider]  sulfamethoxazole-trimethoprim (BACTRIM DS) 800-160 MG tablet Take 1 tablet by mouth daily. 03/08/23   Veryl Speak, [name]  traZODone (DESYREL) 50 MG tablet Take 50 mg by mouth at bedtime. Patient not taking: Reported on 03/08/2023 09/30/22   [provider]      Allergies    Patient has no known allergies.    Review of Systems   Review of Systems  Constitutional:  Positive for fever.  Respiratory:  Positive for shortness of breath.   Cardiovascular:  Positive for chest pain.    Physical Exam Updated Vital Signs BP 121/71   Pulse (!) 101   Temp 98.4 F (36.9 C)   Resp (!) 28   Ht  (1.753 m)   Wt 74.8 kg   SpO2 100%   BMI 24.37 kg/m  Physical Exam Vitals and nursing note reviewed.  Constitutional:      General: He is not in acute distress.    Appearance: He  is not ill-appearing.  HENT:     Head: Normocephalic.  Eyes:     Pupils: Pupils are equal, round, and reactive to light.  Cardiovascular:     Rate and Rhythm: Regular rhythm. Tachycardia present.     Pulses: Normal pulses.     Heart sounds: Normal heart sounds. No murmur heard.    No friction rub. No gallop.     Comments: Tachycardic on monitor 102-107. Pulmonary:     Effort: Pulmonary effort is normal.     Breath sounds: Normal breath sounds.  Abdominal:     General: Abdomen is flat. There is no distension.     Palpations: Abdomen is soft.     Tenderness: There is no abdominal tenderness. There  is no guarding or rebound.  Musculoskeletal:        General: Normal range of motion.     Cervical back: Neck supple.     Comments: No lower extremity edema  Skin:    General: Skin is warm and dry.  Neurological:     General: No focal deficit present.     Mental Status: He is alert.  Psychiatric:        Mood and Affect: Mood normal.        Behavior: Behavior normal.     ED Results / Procedures / Treatments   Labs (all labs ordered are listed, but only abnormal results are displayed) Labs Reviewed  CBC WITH DIFFERENTIAL/PLATELET - Abnormal; Notable for the following components:      Result Value   RBC 3.43 (*)    Hemoglobin 11.1 (*)    HCT 34.8 (*)    MCV 101.5 (*)    All other components within normal limits  BASIC METABOLIC PANEL - Abnormal; Notable for the following components:   Potassium 3.1 (*)    All other components within normal limits  HEPATIC FUNCTION PANEL - Abnormal; Notable for the following components:   Total Protein 8.2 (*)    Albumin 3.3 (*)    All other components within normal limits  RESP PANEL BY RT-PCR (RSV, FLU A&B, COVID)  RVPGX2  CK  LIPASE, BLOOD  D-DIMER, QUANTITATIVE  MAGNESIUM  TROPONIN I (HIGH SENSITIVITY)  TROPONIN I (HIGH SENSITIVITY)    EKG None  Radiology MR Brain W and Wo Contrast  Result Date: 04/13/2023 CLINICAL DATA:  37 year old male with HIV, syphilis. Status post lumbar puncture last month. Meningitis, CNS infection suspected. EXAM: MRI HEAD WITHOUT AND WITH CONTRAST TECHNIQUE: Multiplanar, multiecho pulse sequences of the brain and surrounding structures were obtained without and with intravenous contrast. CONTRAST:  7.6mL GADAVIST GADOBUTROL 1 MMOL/ML IV SOLN COMPARISON:  Head CT 03/22/2023. FINDINGS: Brain: Stable generalized cerebral volume loss for age from last month. No restricted diffusion to suggest acute infarction. No midline shift, mass effect, evidence of mass lesion, ventriculomegaly, extra-axial collection or  acute intracranial hemorrhage. Cervicomedullary junction and pituitary are within normal limits. Bilateral Patchy and confluent cerebral white matter T2 and FLAIR hyperintensity, configuration stable from that on the CT last month. Areas of pronounced subcortical white matter involvement. But no cortical encephalomalacia identified. No gyral swelling or edema identified. No chronic cerebral blood products. No abnormal enhancement identified. No abnormal meningeal thickening or enhancement is identified. Aside from volume loss, deep gray nuclei, brainstem and cerebellum appear negative. Mesial temporal lobe signal is within normal limits. Vascular: Major intracranial vascular flow voids are preserved. The major dural venous sinuses are enhancing and appear to be patent. Skull and upper cervical  spine: Negative visible cervical spine. Visualized bone marrow signal is within normal limits. Sinuses/Orbits: Disconjugate gaze. Otherwise negative orbits. Paranasal sinuses and mastoids are stable and well aerated. Other: Visible internal auditory structures appear normal. Visible scalp and face soft tissues are within normal limits. IMPRESSION: 1. No acute intracranial abnormality. 2. 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 Electronically Signed   By: Odessa Fleming M.D.   On: 04/13/2023 09:30   DG Chest Portable 1 View  Result Date: 04/13/2023 CLINICAL DATA:  Chest pain. EXAM: PORTABLE CHEST 1 VIEW COMPARISON:  None Available. FINDINGS: The heart size and mediastinal contours are within normal limits. Both lungs are clear. The visualized skeletal structures are unremarkable. IMPRESSION: No active disease. Electronically Signed   By: Signa Kell M.D.   On: 04/13/2023 07:30    Procedures Procedures    Medications Ordered in ED Medications  potassium chloride SA (KLOR-CON M) CR tablet 40 mEq (40 mEq Oral Given 04/13/23 0756)   prochlorperazine (COMPAZINE) injection 10 mg (10 mg Intravenous Given 04/13/23 0807)  diphenhydrAMINE (BENADRYL) injection 12.5 mg (12.5 mg Intravenous Given 04/13/23 0806)  gadobutrol (GADAVIST) 1 MMOL/ML injection 7.5 mL (7.5 mLs Intravenous Contrast Given 04/13/23 0901)    ED Course/ Medical Decision Making/ A&P Clinical Course as of 04/13/23 1438  Sat Apr 13, 2023  0741 Potassium(!): 3.1 [CA]  9528 D-Dimer, Quant: 0.40 [CA]    Clinical Course User Index [CA] Mannie Stabile, PA-C                             Medical Decision Making Amount and/or Complexity of Data Reviewed External Data Reviewed: notes.    Details: ID note Labs: ordered. Decision-making details documented in ED Course. Radiology: ordered and independent interpretation performed. Decision-making details documented in ED Course. ECG/medicine tests: ordered and independent interpretation performed. Decision-making details documented in ED Course.  Risk Prescription drug management.   This patient presents to the ED for concern of CP, headache, myalgias, this involves an extensive number of treatment options, and is a complaint that carries with it a high risk of complications and morbidity.  The differential diagnosis includes ACS, PE, dissection, pneumonia, MSK etiology, infection, etc  37 year old male presents to the ED due to right-sided intermittent chest pain associated with shortness of breath. History of AIDS/HIV, recently treated for latent syphilis and chlamydia. Notes he has intermittent joint and muscle pain. No edematous joints. Possible history of DVT?? Not on any anticoagulant. Also admits to a headache. History of headaches. No further penile symptoms. Patient has not been sexually active since being treatment for syphilis and chlamydia.  Upon arrival, patient afebrile, not tachycardic or hypoxic.  During initial evaluation patient tachycardic on monitor between 10 2-1 07.  Patient well-appearing on  exam.  Abdomen soft, nondistended, nontender.  Low suspicion for acute abdomen.  No lower extremity edema.  Given tachycardia and possible history of DVT added D-dimer to rule out PE.  CK given diffuse myalgias.  Migraine cocktail given for headache.  CBC significant for anemia with hemoglobin 11.1 which appears to be around patient's baseline.  No leukocytosis.  BMP significant for hypokalemia 3.1.  Potassium repleted here in the ED.  Added magnesium.  Normal renal function. D-dimer normal, low suspicion for PE.  COVID/influenza negative.  Troponin x 2 normal.  EKG demonstrates normal sinus rhythm.  No signs of acute ischemia.  Low suspicion for ACS.  Chest pain appears atypical.  Presentation nonconcerning for dissection.  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  No acute abnormalities. Will discuss with ID to set up follow-up.  10:42 AM Discussed with Dr. Earlene Plater with ID who was able to schedule patient an appointment on Monday at 1:45pm for follow-up.  It appears their office has been having a difficult time getting in touch with patient.  Dr. 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   Upon reassessment, patient notes his headache has completely resolved.  Normal neurological exam.  11:18 AM informed by RN that patient had eloped from the emergency department.  Patient was informed about his ID appointment on Monday at 1:45 prior to elopement.        Final Clinical Impression(s) / ED Diagnoses Final diagnoses:  Nonspecific chest pain  Acute nonintractable headache, unspecified headache type    Rx / DC Orders ED Discharge Orders     None         Jesusita Oka 04/13/23 1438    Lonell Grandchild, MD 04/13/23 [phone]

## 2023-04-15 ENCOUNTER — Ambulatory Visit: Payer: Self-pay | Admitting: Family

## 2023-04-19 ENCOUNTER — Other Ambulatory Visit: Payer: Self-pay

## 2023-04-19 DIAGNOSIS — B20 Human immunodeficiency virus [HIV] disease: Secondary | ICD-10-CM

## 2023-04-19 DIAGNOSIS — A530 Latent syphilis, unspecified as early or late: Secondary | ICD-10-CM

## 2023-04-22 ENCOUNTER — Emergency Department (HOSPITAL_COMMUNITY)
Admission: EM | Admit: 2023-04-22 | Discharge: 2023-04-22 | Discharge status: Against Medical Advice | Payer: Self-pay | Source: Home / Self Care

## 2023-04-22 ENCOUNTER — Other Ambulatory Visit: Payer: Self-pay

## 2023-04-22 DIAGNOSIS — A530 Latent syphilis, unspecified as early or late: Secondary | ICD-10-CM

## 2023-04-22 DIAGNOSIS — B20 Human immunodeficiency virus [HIV] disease: Secondary | ICD-10-CM

## 2023-04-24 LAB — T-HELPER CELLS (CD4) COUNT (NOT AT ARMC)
Absolute CD4: 146 cells/uL — ABNORMAL LOW (ref 490–1740)
CD4 T Helper %: 10 % — ABNORMAL LOW (ref 30–61)

## 2023-04-25 LAB — HIV-1 RNA QUANT-NO REFLEX-BLD
HIV 1 RNA Quant: 93 Copies/mL — ABNORMAL HIGH
HIV-1 RNA Quant, Log: 1.97 Log cps/mL — ABNORMAL HIGH

## 2023-04-25 LAB — T-HELPER CELLS (CD4) COUNT (NOT AT ARMC): Total lymphocyte count: 1473 cells/uL (ref 850–3900)

## 2023-04-25 LAB — RPR: RPR Ser Ql: REACTIVE — AB

## 2023-04-25 LAB — RPR TITER: RPR Titer: 1:32 {titer} — ABNORMAL HIGH

## 2023-04-25 LAB — T PALLIDUM AB: T Pallidum Abs: POSITIVE — AB

## 2023-05-17 ENCOUNTER — Ambulatory Visit: Payer: Self-pay | Admitting: Family

## 2023-08-19 ENCOUNTER — Telehealth: Payer: Self-pay

## 2023-08-19 DIAGNOSIS — B20 Human immunodeficiency virus [HIV] disease: Secondary | ICD-10-CM

## 2023-08-19 MED ORDER — SULFAMETHOXAZOLE-TRIMETHOPRIM 800-160 MG PO TABS
1.0000 | ORAL_TABLET | Freq: Every day | ORAL | 0 refills | Status: DC
Start: 2023-08-19 — End: 2024-02-22

## 2023-08-19 MED ORDER — BIKTARVY 50-200-25 MG PO TABS
1.0000 | ORAL_TABLET | Freq: Every day | ORAL | 0 refills | Status: DC
Start: 2023-08-19 — End: 2024-02-24

## 2023-08-19 NOTE — Telephone Encounter (Signed)
Mark Galloway called to have Biktarvy and Bactrim prescriptions transferred to mail order pharmacy as he has moved. He will still be continuing his care with [facility]. Follow up appointment with Tammy Sours scheduled. Refills sent to [facility] in [place]. Provided patient with their phone number to set up delivery.   Sandie Ano, RN

## 2023-09-13 ENCOUNTER — Ambulatory Visit: Payer: Self-pay | Admitting: Family

## 2023-12-10 ENCOUNTER — Telehealth: Payer: Self-pay

## 2023-12-10 NOTE — Telephone Encounter (Signed)
Traylen called to get a refill of his Susanne Borders, he has no-showed three appointments with Tammy Sours. He has moved, but would like to continue his care with [facility].   Last prescription for Susanne Borders was sent in August with 0 refills. He reports he's only been out of medication for one month. No recent dispense history to review. Discussed the importance of keeping follow up appointments and regularly refilling medications.   He accepts appointment with pharmacy team 12/12 since Tammy Sours is out of the office.   Sandie Ano, RN

## 2023-12-12 ENCOUNTER — Ambulatory Visit: Payer: Self-pay | Admitting: Pharmacist

## 2024-02-21 ENCOUNTER — Other Ambulatory Visit: Payer: Self-pay

## 2024-02-21 ENCOUNTER — Encounter (HOSPITAL_COMMUNITY): Payer: Self-pay

## 2024-02-21 ENCOUNTER — Inpatient Hospital Stay (HOSPITAL_COMMUNITY)
Admission: EM | Admit: 2024-02-21 | Discharge: 2024-02-24 | DRG: 580 | Disposition: A | Discharge status: Home / Self Care | Payer: Self-pay | Attending: Internal Medicine | Admitting: Internal Medicine

## 2024-02-21 DIAGNOSIS — F419 Anxiety disorder, unspecified: Secondary | ICD-10-CM | POA: Diagnosis present

## 2024-02-21 DIAGNOSIS — E119 Type 2 diabetes mellitus without complications: Secondary | ICD-10-CM | POA: Diagnosis present

## 2024-02-21 DIAGNOSIS — L0390 Cellulitis, unspecified: Secondary | ICD-10-CM | POA: Diagnosis not present

## 2024-02-21 DIAGNOSIS — L03113 Cellulitis of right upper limb: Principal | ICD-10-CM | POA: Diagnosis present

## 2024-02-21 DIAGNOSIS — F17290 Nicotine dependence, other tobacco product, uncomplicated: Secondary | ICD-10-CM | POA: Diagnosis present

## 2024-02-21 DIAGNOSIS — Z79899 Other long term (current) drug therapy: Secondary | ICD-10-CM

## 2024-02-21 DIAGNOSIS — Z5900 Homelessness unspecified: Secondary | ICD-10-CM

## 2024-02-21 DIAGNOSIS — Z23 Encounter for immunization: Secondary | ICD-10-CM

## 2024-02-21 DIAGNOSIS — B20 Human immunodeficiency virus [HIV] disease: Secondary | ICD-10-CM

## 2024-02-21 DIAGNOSIS — Z91199 Patient's noncompliance with other medical treatment and regimen due to unspecified reason: Secondary | ICD-10-CM

## 2024-02-21 DIAGNOSIS — L02511 Cutaneous abscess of right hand: Secondary | ICD-10-CM | POA: Diagnosis present

## 2024-02-21 DIAGNOSIS — Z21 Asymptomatic human immunodeficiency virus [HIV] infection status: Secondary | ICD-10-CM | POA: Diagnosis present

## 2024-02-21 DIAGNOSIS — B9561 Methicillin susceptible Staphylococcus aureus infection as the cause of diseases classified elsewhere: Secondary | ICD-10-CM | POA: Diagnosis present

## 2024-02-21 DIAGNOSIS — F209 Schizophrenia, unspecified: Secondary | ICD-10-CM | POA: Diagnosis present

## 2024-02-21 NOTE — ED Triage Notes (Signed)
 Pt picked up [facility] [employer] by EMS. Significant swelling to right hand. Noticed redness to pinky with slight swelling in morning, progressively worsening throughout the day. Denies fever. 128/82, HR 102, Resp 22, SpO2 100% RA. Unremarkable EKG.

## 2024-02-21 NOTE — ED Provider Triage Note (Signed)
 Emergency Medicine Provider Triage Evaluation Note  Mark Galloway , a 38 y.o. male  was evaluated in triage.  Pt complains of right hand swelling, pain, blistering that started this morning per patient.  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  No fevers per patient.  Severe pain in the right hand worse in the pinky.  History of HIV poorly compliant with Biktarvy, diagnosis of AIDS and medical record..  Review of Systems  Positive: As above Negative: Chest pain shortness of breath  Physical Exam  BP 103/81 (BP Location: Left Arm)   Pulse 100   Temp 98.5 F (36.9 C) (Oral)   Resp 20   Ht 5\' 9"  (1.753 m)   Wt 74.8 kg   SpO2 98%   BMI 24.37 kg/m  Gen:   Awake, visit comfortable Resp:  Normal effort  MSK:   Moves extremities without difficulty  Other:  Right hand swelling worse in the fifth and fourth digits, blistering over the volar aspect of the foot the fifth digit.  2+ radial pulses, normal capillary refill.  Ring on the fourth finger that will likely need to be cut off. No murmur auscultated on cardiac exam.   Medical Decision Making  Medically screening exam initiated at 11:56 PM.  Appropriate orders placed.  Mark Galloway was informed that the remainder of the evaluation will be completed by another provider, this initial triage assessment does not replace that evaluation, and the importance of remaining in the ED until their evaluation is complete.  Workup initiated. Normal Hemodynamics on intake.   This chart was dictated using voice recognition software, Dragon. Despite the best efforts of this provider to proofread and correct errors, errors may still occur which can change documentation meaning.    Paris Lore, PA-C 02/21/24 2359

## 2024-02-22 ENCOUNTER — Other Ambulatory Visit: Payer: Self-pay

## 2024-02-22 ENCOUNTER — Inpatient Hospital Stay (HOSPITAL_COMMUNITY): Payer: Self-pay | Admitting: Certified Registered Nurse Anesthetist

## 2024-02-22 ENCOUNTER — Encounter (HOSPITAL_COMMUNITY)
Admission: EM | Disposition: A | Discharge status: Home / Self Care | Payer: Self-pay | Source: Home / Self Care | Attending: Internal Medicine

## 2024-02-22 ENCOUNTER — Emergency Department (HOSPITAL_COMMUNITY): Payer: Self-pay

## 2024-02-22 ENCOUNTER — Encounter (HOSPITAL_COMMUNITY): Payer: Self-pay | Admitting: Internal Medicine

## 2024-02-22 DIAGNOSIS — E119 Type 2 diabetes mellitus without complications: Secondary | ICD-10-CM

## 2024-02-22 DIAGNOSIS — L0390 Cellulitis, unspecified: Secondary | ICD-10-CM | POA: Diagnosis present

## 2024-02-22 DIAGNOSIS — Z91199 Patient's noncompliance with other medical treatment and regimen due to unspecified reason: Secondary | ICD-10-CM | POA: Diagnosis not present

## 2024-02-22 DIAGNOSIS — F17290 Nicotine dependence, other tobacco product, uncomplicated: Secondary | ICD-10-CM | POA: Diagnosis present

## 2024-02-22 DIAGNOSIS — I10 Essential (primary) hypertension: Secondary | ICD-10-CM

## 2024-02-22 DIAGNOSIS — Z5900 Homelessness unspecified: Secondary | ICD-10-CM | POA: Diagnosis not present

## 2024-02-22 DIAGNOSIS — B9561 Methicillin susceptible Staphylococcus aureus infection as the cause of diseases classified elsewhere: Secondary | ICD-10-CM | POA: Diagnosis present

## 2024-02-22 DIAGNOSIS — F419 Anxiety disorder, unspecified: Secondary | ICD-10-CM | POA: Diagnosis present

## 2024-02-22 DIAGNOSIS — Z23 Encounter for immunization: Secondary | ICD-10-CM | POA: Diagnosis present

## 2024-02-22 DIAGNOSIS — L03113 Cellulitis of right upper limb: Secondary | ICD-10-CM | POA: Diagnosis present

## 2024-02-22 DIAGNOSIS — L02511 Cutaneous abscess of right hand: Secondary | ICD-10-CM

## 2024-02-22 DIAGNOSIS — Z79899 Other long term (current) drug therapy: Secondary | ICD-10-CM | POA: Diagnosis not present

## 2024-02-22 DIAGNOSIS — Z21 Asymptomatic human immunodeficiency virus [HIV] infection status: Secondary | ICD-10-CM | POA: Diagnosis present

## 2024-02-22 DIAGNOSIS — F209 Schizophrenia, unspecified: Secondary | ICD-10-CM | POA: Diagnosis present

## 2024-02-22 HISTORY — PX: I & D EXTREMITY: SHX5045

## 2024-02-22 LAB — COMPREHENSIVE METABOLIC PANEL
ALT: 12 U/L (ref 0–44)
AST: 26 U/L (ref 15–41)
Albumin: 3.3 g/dL — ABNORMAL LOW (ref 3.5–5.0)
Alkaline Phosphatase: 62 U/L (ref 38–126)
Anion gap: 9 (ref 5–15)
BUN: 9 mg/dL (ref 6–20)
CO2: 22 mmol/L (ref 22–32)
Calcium: 8.7 mg/dL — ABNORMAL LOW (ref 8.9–10.3)
Chloride: 100 mmol/L (ref 98–111)
Creatinine, Ser: 0.69 mg/dL (ref 0.61–1.24)
GFR, Estimated: >60 mL/min (ref >60)
Glucose, Bld: 113 mg/dL — ABNORMAL HIGH (ref 70–99)
Potassium: 3.5 mmol/L (ref 3.5–5.1)
Sodium: 131 mmol/L — ABNORMAL LOW (ref 135–145)
Total Bilirubin: 0.3 mg/dL (ref 0.0–1.2)
Total Protein: 7.9 g/dL (ref 6.5–8.1)

## 2024-02-22 LAB — CBC WITH DIFFERENTIAL/PLATELET
Abs Immature Granulocytes: 0.03 10*3/uL (ref 0.00–0.07)
Basophils Absolute: 0 10*3/uL (ref 0.0–0.1)
Basophils Relative: 0 %
Eosinophils Absolute: 0 10*3/uL (ref 0.0–0.5)
Eosinophils Relative: 0 %
HCT: 32.4 % — ABNORMAL LOW (ref 39.0–52.0)
Hemoglobin: 10.9 g/dL — ABNORMAL LOW (ref 13.0–17.0)
Immature Granulocytes: 0 %
Lymphocytes Relative: 16 %
Lymphs Abs: 1.1 10*3/uL (ref 0.7–4.0)
MCH: 32.7 pg (ref 26.0–34.0)
MCHC: 33.6 g/dL (ref 30.0–36.0)
MCV: 97.3 fL (ref 80.0–100.0)
Monocytes Absolute: 0.5 10*3/uL (ref 0.1–1.0)
Monocytes Relative: 7 %
Neutro Abs: 5.5 10*3/uL (ref 1.7–7.7)
Neutrophils Relative %: 77 %
Platelets: 254 10*3/uL (ref 150–400)
RBC: 3.33 MIL/uL — ABNORMAL LOW (ref 4.22–5.81)
RDW: 14.9 % (ref 11.5–15.5)
WBC: 7.2 10*3/uL (ref 4.0–10.5)
nRBC: 0 % (ref 0.0–0.2)

## 2024-02-22 LAB — CBC
HCT: 32.6 % — ABNORMAL LOW (ref 39.0–52.0)
Hemoglobin: 11 g/dL — ABNORMAL LOW (ref 13.0–17.0)
MCH: 32.8 pg (ref 26.0–34.0)
MCHC: 33.7 g/dL (ref 30.0–36.0)
MCV: 97.3 fL (ref 80.0–100.0)
Platelets: 272 10*3/uL (ref 150–400)
RBC: 3.35 MIL/uL — ABNORMAL LOW (ref 4.22–5.81)
RDW: 14.9 % (ref 11.5–15.5)
WBC: 10.6 10*3/uL — ABNORMAL HIGH (ref 4.0–10.5)
nRBC: 0 % (ref 0.0–0.2)

## 2024-02-22 LAB — CREATININE, SERUM
Creatinine, Ser: 0.86 mg/dL (ref 0.61–1.24)
GFR, Estimated: >60 mL/min (ref >60)

## 2024-02-22 LAB — PROTIME-INR
INR: 1.1 (ref 0.8–1.2)
Prothrombin Time: 14 s (ref 11.4–15.2)

## 2024-02-22 LAB — I-STAT CG4 LACTIC ACID, ED: Lactic Acid, Venous: 1.2 mmol/L (ref 0.5–1.9)

## 2024-02-22 LAB — APTT: aPTT: 39 s — ABNORMAL HIGH (ref 24–36)

## 2024-02-22 SURGERY — IRRIGATION AND DEBRIDEMENT EXTREMITY
Anesthesia: General | Laterality: Right

## 2024-02-22 MED ORDER — MORPHINE SULFATE (PF) 2 MG/ML IV SOLN: 0.5000 mg | INTRAVENOUS | Status: DC | PRN

## 2024-02-22 MED ORDER — CHLORHEXIDINE GLUCONATE 0.12 % MT SOLN
15.0000 mL | Freq: Once | OROMUCOSAL | Status: AC
  Administered 2024-02-22: 15 mL via OROMUCOSAL

## 2024-02-22 MED ORDER — BUPIVACAINE HCL (PF) 0.25 % IJ SOLN
INTRAMUSCULAR | Status: AC
  Filled 2024-02-22: qty 10

## 2024-02-22 MED ORDER — TETANUS-DIPHTH-ACELL PERTUSSIS 5-2.5-18.5 LF-MCG/0.5 IM SUSY
0.5000 mL | PREFILLED_SYRINGE | Freq: Once | INTRAMUSCULAR | Status: AC
  Administered 2024-02-22: 0.5 mL via INTRAMUSCULAR
  Filled 2024-02-22: qty 0.5

## 2024-02-22 MED ORDER — PROPOFOL 10 MG/ML IV BOLUS
INTRAVENOUS | Status: AC
  Filled 2024-02-22: qty 20

## 2024-02-22 MED ORDER — SUCCINYLCHOLINE CHLORIDE 200 MG/10ML IV SOSY
PREFILLED_SYRINGE | INTRAVENOUS | Status: DC | PRN
  Administered 2024-02-22: 120 mg via INTRAVENOUS

## 2024-02-22 MED ORDER — ALBUMIN HUMAN 5 % IV SOLN: INTRAVENOUS | Status: DC | PRN

## 2024-02-22 MED ORDER — DEXAMETHASONE SODIUM PHOSPHATE 10 MG/ML IJ SOLN
INTRAMUSCULAR | Status: AC
  Filled 2024-02-22: qty 1

## 2024-02-22 MED ORDER — ONDANSETRON HCL 4 MG/2ML IJ SOLN
INTRAMUSCULAR | Status: AC
  Filled 2024-02-22: qty 2

## 2024-02-22 MED ORDER — FENTANYL CITRATE (PF) 250 MCG/5ML IJ SOLN
INTRAMUSCULAR | Status: DC | PRN
  Administered 2024-02-22: 50 ug via INTRAVENOUS
  Administered 2024-02-22 (×2): 100 ug via INTRAVENOUS

## 2024-02-22 MED ORDER — HYDROMORPHONE HCL 1 MG/ML IJ SOLN
0.5000 mg | INTRAMUSCULAR | Status: DC | PRN
  Administered 2024-02-22 (×2): 0.5 mg via INTRAVENOUS
  Filled 2024-02-22 (×2): qty 1

## 2024-02-22 MED ORDER — CALCIUM CHLORIDE 10 % IV SOLN
INTRAVENOUS | Status: AC
  Filled 2024-02-22: qty 10

## 2024-02-22 MED ORDER — MIDAZOLAM HCL 2 MG/2ML IJ SOLN
INTRAMUSCULAR | Status: AC
  Filled 2024-02-22: qty 2

## 2024-02-22 MED ORDER — ENOXAPARIN SODIUM 40 MG/0.4ML IJ SOSY
40.0000 mg | PREFILLED_SYRINGE | INTRAMUSCULAR | Status: DC
  Administered 2024-02-23 – 2024-02-24 (×2): 40 mg via SUBCUTANEOUS
  Filled 2024-02-22 (×2): qty 0.4

## 2024-02-22 MED ORDER — SENNOSIDES-DOCUSATE SODIUM 8.6-50 MG PO TABS
2.0000 | ORAL_TABLET | Freq: Two times a day (BID) | ORAL | Status: DC
  Administered 2024-02-22 – 2024-02-23 (×3): 2 via ORAL
  Filled 2024-02-22 (×4): qty 2

## 2024-02-22 MED ORDER — HYDROMORPHONE HCL 1 MG/ML IJ SOLN
INTRAMUSCULAR | Status: AC
  Filled 2024-02-22: qty 0.5

## 2024-02-22 MED ORDER — ORAL CARE MOUTH RINSE: 15.0000 mL | Freq: Once | OROMUCOSAL | Status: AC

## 2024-02-22 MED ORDER — ONDANSETRON HCL 4 MG/2ML IJ SOLN
4.0000 mg | Freq: Four times a day (QID) | INTRAMUSCULAR | Status: DC | PRN
Start: 2024-02-22 — End: 2024-02-24
  Administered 2024-02-22: 4 mg via INTRAVENOUS
  Filled 2024-02-22: qty 2

## 2024-02-22 MED ORDER — SODIUM CHLORIDE 0.9 % IV SOLN
2.0000 g | Freq: Once | INTRAVENOUS | Status: AC
  Administered 2024-02-22: 2 g via INTRAVENOUS
  Filled 2024-02-22: qty 12.5

## 2024-02-22 MED ORDER — HYDROMORPHONE HCL 1 MG/ML IJ SOLN
0.2500 mg | INTRAMUSCULAR | Status: DC | PRN
  Administered 2024-02-22: .5 mg via INTRAVENOUS

## 2024-02-22 MED ORDER — VANCOMYCIN HCL 1500 MG/300ML IV SOLN
1500.0000 mg | Freq: Once | INTRAVENOUS | Status: AC
  Administered 2024-02-22: 1500 mg via INTRAVENOUS
  Filled 2024-02-22: qty 300

## 2024-02-22 MED ORDER — MEPERIDINE HCL 25 MG/ML IJ SOLN: 6.2500 mg | INTRAMUSCULAR | Status: DC | PRN

## 2024-02-22 MED ORDER — CEFAZOLIN SODIUM-DEXTROSE 2-4 GM/100ML-% IV SOLN
2.0000 g | Freq: Three times a day (TID) | INTRAVENOUS | Status: DC
  Administered 2024-02-22 – 2024-02-24 (×6): 2 g via INTRAVENOUS
  Filled 2024-02-22 (×6): qty 100

## 2024-02-22 MED ORDER — ACETAMINOPHEN 650 MG RE SUPP: 650.0000 mg | Freq: Four times a day (QID) | RECTAL | Status: DC | PRN

## 2024-02-22 MED ORDER — OXYCODONE HCL 5 MG/5ML PO SOLN: 5.0000 mg | Freq: Once | ORAL | Status: DC | PRN

## 2024-02-22 MED ORDER — SODIUM CHLORIDE 0.9% FLUSH
3.0000 mL | Freq: Two times a day (BID) | INTRAVENOUS | Status: DC
  Administered 2024-02-22 – 2024-02-24 (×5): 3 mL via INTRAVENOUS

## 2024-02-22 MED ORDER — CHLORHEXIDINE GLUCONATE 0.12 % MT SOLN
OROMUCOSAL | Status: AC
  Filled 2024-02-22: qty 15

## 2024-02-22 MED ORDER — 0.9 % SODIUM CHLORIDE (POUR BTL) OPTIME
TOPICAL | Status: DC | PRN
  Administered 2024-02-22: 1000 mL

## 2024-02-22 MED ORDER — VANCOMYCIN HCL 1750 MG/350ML IV SOLN
1750.0000 mg | Freq: Two times a day (BID) | INTRAVENOUS | Status: DC
  Administered 2024-02-22: 1000 mg via INTRAVENOUS
  Administered 2024-02-23 – 2024-02-24 (×3): 1750 mg via INTRAVENOUS
  Filled 2024-02-22 (×6): qty 350

## 2024-02-22 MED ORDER — ACETAMINOPHEN 325 MG PO TABS
650.0000 mg | ORAL_TABLET | Freq: Four times a day (QID) | ORAL | Status: DC | PRN
  Administered 2024-02-22: 650 mg via ORAL
  Filled 2024-02-22: qty 2

## 2024-02-22 MED ORDER — PHENYLEPHRINE 80 MCG/ML (10ML) SYRINGE FOR IV PUSH (FOR BLOOD PRESSURE SUPPORT)
PREFILLED_SYRINGE | INTRAVENOUS | Status: DC | PRN
  Administered 2024-02-22: 80 ug via INTRAVENOUS
  Administered 2024-02-22: 160 ug via INTRAVENOUS
  Administered 2024-02-22 (×4): 80 ug via INTRAVENOUS

## 2024-02-22 MED ORDER — ONDANSETRON HCL 4 MG/2ML IJ SOLN
INTRAMUSCULAR | Status: DC | PRN
  Administered 2024-02-22: 4 mg via INTRAVENOUS

## 2024-02-22 MED ORDER — LIDOCAINE 2% (20 MG/ML) 5 ML SYRINGE
INTRAMUSCULAR | Status: DC | PRN
  Administered 2024-02-22: 60 mg via INTRAVENOUS

## 2024-02-22 MED ORDER — LIDOCAINE 2% (20 MG/ML) 5 ML SYRINGE
INTRAMUSCULAR | Status: AC
  Filled 2024-02-22: qty 5

## 2024-02-22 MED ORDER — PROPOFOL 10 MG/ML IV BOLUS
INTRAVENOUS | Status: DC | PRN
  Administered 2024-02-22: 100 mg via INTRAVENOUS

## 2024-02-22 MED ORDER — MIDAZOLAM HCL 2 MG/2ML IJ SOLN: 0.5000 mg | Freq: Once | INTRAMUSCULAR | Status: DC | PRN

## 2024-02-22 MED ORDER — MIDAZOLAM HCL 2 MG/2ML IJ SOLN
INTRAMUSCULAR | Status: DC | PRN
  Administered 2024-02-22: 2 mg via INTRAVENOUS

## 2024-02-22 MED ORDER — DEXAMETHASONE SODIUM PHOSPHATE 10 MG/ML IJ SOLN
INTRAMUSCULAR | Status: DC | PRN
  Administered 2024-02-22: 10 mg via INTRAVENOUS

## 2024-02-22 MED ORDER — FENTANYL CITRATE (PF) 250 MCG/5ML IJ SOLN
INTRAMUSCULAR | Status: AC
  Filled 2024-02-22: qty 5

## 2024-02-22 MED ORDER — VITAMIN C 500 MG PO TABS
1000.0000 mg | ORAL_TABLET | Freq: Every day | ORAL | Status: DC
  Administered 2024-02-22 – 2024-02-24 (×3): 1000 mg via ORAL
  Filled 2024-02-22 (×3): qty 2

## 2024-02-22 MED ORDER — BICTEGRAVIR-EMTRICITAB-TENOFOV 50-200-25 MG PO TABS
1.0000 | ORAL_TABLET | Freq: Every day | ORAL | Status: DC
  Administered 2024-02-22 – 2024-02-24 (×3): 1 via ORAL
  Filled 2024-02-22 (×4): qty 1

## 2024-02-22 MED ORDER — HYDROCODONE-ACETAMINOPHEN 5-325 MG PO TABS: 1.0000 | ORAL_TABLET | ORAL | Status: DC | PRN

## 2024-02-22 MED ORDER — HYDROCODONE-ACETAMINOPHEN 7.5-325 MG PO TABS: 1.0000 | ORAL_TABLET | ORAL | Status: DC | PRN

## 2024-02-22 MED ORDER — INSULIN ASPART 100 UNIT/ML IJ SOLN: 0.0000 [IU] | INTRAMUSCULAR | Status: DC | PRN

## 2024-02-22 MED ORDER — CALCIUM CHLORIDE 10 % IV SOLN
INTRAVENOUS | Status: DC | PRN
  Administered 2024-02-22 (×2): 100 mg via INTRAVENOUS

## 2024-02-22 MED ORDER — VANCOMYCIN HCL IN DEXTROSE 1-5 GM/200ML-% IV SOLN: 1000.0000 mg | Freq: Once | INTRAVENOUS | Status: DC

## 2024-02-22 MED ORDER — SUCCINYLCHOLINE CHLORIDE 200 MG/10ML IV SOSY
PREFILLED_SYRINGE | INTRAVENOUS | Status: AC
  Filled 2024-02-22: qty 10

## 2024-02-22 MED ORDER — PHENYLEPHRINE HCL-NACL 20-0.9 MG/250ML-% IV SOLN
INTRAVENOUS | Status: DC | PRN
  Administered 2024-02-22: 75 ug/min via INTRAVENOUS

## 2024-02-22 MED ORDER — LACTATED RINGERS IV SOLN: INTRAVENOUS | Status: DC | PRN

## 2024-02-22 MED ORDER — NAPROXEN 250 MG PO TABS
250.0000 mg | ORAL_TABLET | Freq: Two times a day (BID) | ORAL | Status: DC
  Administered 2024-02-22 – 2024-02-24 (×4): 250 mg via ORAL
  Filled 2024-02-22 (×4): qty 1

## 2024-02-22 MED ORDER — POLYETHYLENE GLYCOL 3350 17 G PO PACK: 17.0000 g | PACK | Freq: Every day | ORAL | Status: DC | PRN

## 2024-02-22 MED ORDER — LACTATED RINGERS IV SOLN: INTRAVENOUS | Status: DC

## 2024-02-22 MED ORDER — ACETAMINOPHEN 325 MG PO TABS
650.0000 mg | ORAL_TABLET | Freq: Once | ORAL | Status: AC
Start: 2024-02-22 — End: 2024-02-22
  Administered 2024-02-22: 650 mg via ORAL
  Filled 2024-02-22: qty 2

## 2024-02-22 MED ORDER — VANCOMYCIN HCL IN DEXTROSE 1-5 GM/200ML-% IV SOLN
INTRAVENOUS | Status: AC
Start: 2024-02-22 — End: 2024-02-22
  Filled 2024-02-22: qty 200

## 2024-02-22 MED ORDER — OXYCODONE HCL 5 MG PO TABS: 5.0000 mg | ORAL_TABLET | Freq: Once | ORAL | Status: DC | PRN

## 2024-02-22 SURGICAL SUPPLY — 54 items
ADAPTER CATH SYR TO TUBING 38M (ADAPTER) ×2 IMPLANT
BAG COUNTER SPONGE SURGICOUNT (BAG) ×2 IMPLANT
BNDG COHESIVE 2X5 TAN ST LF (GAUZE/BANDAGES/DRESSINGS) IMPLANT
BNDG ELASTIC 3INX 5YD STR LF (GAUZE/BANDAGES/DRESSINGS) ×2 IMPLANT
BNDG ELASTIC 4X5.8 VLCR STR LF (GAUZE/BANDAGES/DRESSINGS) ×2 IMPLANT
BNDG ESMARK 4X9 LF (GAUZE/BANDAGES/DRESSINGS) IMPLANT
BNDG GAUZE DERMACEA FLUFF 4 (GAUZE/BANDAGES/DRESSINGS) ×2 IMPLANT
CANNULA VESSEL 3MM 2 BLNT TIP (CANNULA) IMPLANT
CORD BIPOLAR FORCEPS 12FT (ELECTRODE) ×2 IMPLANT
COVER SURGICAL LIGHT HANDLE (MISCELLANEOUS) ×2 IMPLANT
CUFF TOURN SGL QUICK 18X4 (TOURNIQUET CUFF) IMPLANT
CUFF TRNQT CYL 24X4X16.5-23 (TOURNIQUET CUFF) IMPLANT
DRAIN PENROSE 12X.25 LTX STRL (MISCELLANEOUS) IMPLANT
DRSG XEROFORM 1X8 (GAUZE/BANDAGES/DRESSINGS) IMPLANT
GAUZE PACKING IODOFORM 1/2INX (GAUZE/BANDAGES/DRESSINGS) IMPLANT
GAUZE PAD ABD 8X10 STRL (GAUZE/BANDAGES/DRESSINGS) ×4 IMPLANT
GAUZE SPONGE 4X4 12PLY STRL (GAUZE/BANDAGES/DRESSINGS) ×2 IMPLANT
GAUZE XEROFORM 1X8 LF (GAUZE/BANDAGES/DRESSINGS) ×2 IMPLANT
GLOVE BIO SURGEON STRL SZ7.5 (GLOVE) ×2 IMPLANT
GLOVE BIOGEL PI IND STRL 8 (GLOVE) ×2 IMPLANT
GLOVE BIOGEL PI IND STRL 8.5 (GLOVE) IMPLANT
GLOVE PI ORTHO PRO STRL SZ8 (GLOVE) IMPLANT
GLOVE SURG ORTHO 8.0 STRL STRW (GLOVE) IMPLANT
GOWN STRL REUS W/ TWL LRG LVL3 (GOWN DISPOSABLE) ×2 IMPLANT
GOWN STRL REUS W/ TWL XL LVL3 (GOWN DISPOSABLE) ×2 IMPLANT
KIT BASIN OR (CUSTOM PROCEDURE TRAY) ×2 IMPLANT
KIT TURNOVER KIT B (KITS) ×2 IMPLANT
LOOP VASCLR MAXI BLUE 18IN ST (MISCELLANEOUS) IMPLANT
LOOPS VASCLR MAXI BLUE 18IN ST (MISCELLANEOUS) IMPLANT
MANIFOLD NEPTUNE II (INSTRUMENTS) IMPLANT
NDL HYPO 25X1 1.5 SAFETY (NEEDLE) IMPLANT
NEEDLE HYPO 25X1 1.5 SAFETY (NEEDLE) IMPLANT
NS IRRIG 1000ML POUR BTL (IV SOLUTION) ×2 IMPLANT
PACK ORTHO EXTREMITY (CUSTOM PROCEDURE TRAY) ×2 IMPLANT
PAD ARMBOARD 7.5X6 YLW CONV (MISCELLANEOUS) ×4 IMPLANT
PADDING CAST ABS COTTON 4X4 ST (CAST SUPPLIES) IMPLANT
SET CYSTO W/LG BORE CLAMP LF (SET/KITS/TRAYS/PACK) IMPLANT
SOL PREP POV-IOD 4OZ 10% (MISCELLANEOUS) ×4 IMPLANT
SPIKE FLUID TRANSFER (MISCELLANEOUS) ×2 IMPLANT
SPLINT PLASTER CAST XFAST 3X15 (CAST SUPPLIES) IMPLANT
SPONGE T-LAP 4X18 ~~LOC~~+RFID (SPONGE) ×2 IMPLANT
SUT ETHILON 4 0 P 3 18 (SUTURE) IMPLANT
SUT ETHILON 4 0 PS 2 18 (SUTURE) IMPLANT
SUT MON AB 5-0 P3 18 (SUTURE) IMPLANT
SWAB COLLECTION DEVICE MRSA (MISCELLANEOUS) IMPLANT
SWAB CULTURE ESWAB REG 1ML (MISCELLANEOUS) IMPLANT
SYR 20ML LL LF (SYRINGE) ×2 IMPLANT
SYR CONTROL 10ML LL (SYRINGE) IMPLANT
TIE VASCULAR MAXI BLUE 18IN ST (MISCELLANEOUS) IMPLANT
TOWEL GREEN STERILE (TOWEL DISPOSABLE) ×2 IMPLANT
TUBE CONNECTING 12X1/4 (SUCTIONS) ×2 IMPLANT
TUBE NG 5FR 35IN ENFIT (TUBING) IMPLANT
UNDERPAD 30X36 HEAVY ABSORB (UNDERPADS AND DIAPERS) ×2 IMPLANT
YANKAUER SUCT BULB TIP NO VENT (SUCTIONS) ×2 IMPLANT

## 2024-02-22 NOTE — ED Notes (Signed)
 Pt asking "Can I get a sandwhich yall are starving me out here, I have not eaten in days." Pt educated on importance of staying NPO for surgery.

## 2024-02-22 NOTE — Anesthesia Procedure Notes (Signed)
 Procedure Name: Intubation Date/Time: 02/22/2024 11:27 AM  Performed by: Gloris Ham, CRNAPre-anesthesia Checklist: Patient identified, Emergency Drugs available, Suction available and Patient being monitored Patient Re-evaluated:Patient Re-evaluated prior to induction Oxygen Delivery Method: Circle System Utilized Preoxygenation: Pre-oxygenation with 100% oxygen Induction Type: IV induction and Rapid sequence Ventilation: Mask ventilation without difficulty Laryngoscope Size: Glidescope and 3 Grade View: Grade II Tube type: Oral Tube size: 7.5 mm Number of attempts: 1 Airway Equipment and Method: Stylet and Oral airway Placement Confirmation: ETT inserted through vocal cords under direct vision, positive ETCO2 and breath sounds checked- equal and bilateral Tube secured with: Tape Dental Injury: Teeth and Oropharynx as per pre-operative assessment

## 2024-02-22 NOTE — ED Notes (Signed)
 Hand Surgeon at bedside for patient evaluation and status update. Due to patient eating meal, NPO status must be 8 hours from 0230 on 02/22/2024.

## 2024-02-22 NOTE — H&P (Signed)
 History and Physical    Patient: Mark Galloway ZOX:[id number] DOB: 1986-10-31 DOA: 02/21/2024 DOS: the patient was seen and examined on 02/22/2024 PCP: Pcp, No  Patient coming from: Home  Chief Complaint:  Chief Complaint  Patient presents with   hand swelling   HPI: Mark Galloway is a 38 y.o. male with medical history significant of HIV on chronic HAART.  Patient reports to me that he was in his usual state of health when he went to bed evening before last (02/20/2024).  With abrupt onset of right hand pain swelling and blistering on the morning of 05/20/2024.  Patient denies any fever.  Denies IV drug use.  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  Patient denies any marine exposure any dog bite cat bite or any human bite.  Patient wonders if there was an insect bite while he was sleeping evening before last.  Regardless patient came to the ER late last evening on 05/20/2024.  Found to be tachycardic, temp of 100.2 and concern for abscess over the right little finger.  Patient was taken emergently to the OR by orthopedic surgery this morning.  DATE OF PROCEDURE: 02/22/24    PREOPERATIVE DIAGNOSIS: Right small finger/hand abscess   POSTOPERATIVE DIAGNOSIS: 1.  Right small finger flexor sheath infection 2.  Right hand collar-button abscess at ring and small finger   PROCEDURE: 1.  Incision and drainage right small finger including flexor sheath 2.  Incision and drainage right hand collar-button abscess  Right upper extremity has since been dressed.  Patient now feels totally asymptomatic would like to go home. Review of Systems: As mentioned in the history of present illness. All other systems reviewed and are negative. Past Medical History:  Diagnosis Date   Bipolar 1 disorder ([name])    HIV infection ([name])    Schizophrenia ([name])    Shingles    Past Surgical History:  Procedure Laterality Date   IRRIGATION AND DEBRIDEMENT FOOT Left    Social History:  reports that  he has been smoking cigars and cigarettes. He has never used smokeless tobacco. He reports current alcohol use. He reports that he does not use drugs.  Allergies  Allergen Reactions   Peanut Oil Hives   Peanut-Containing Drug Products     History reviewed. No pertinent family history.  Prior to Admission medications   Medication Sig Start Date End Date Taking? Authorizing Provider  acetaminophen (TYLENOL) 500 MG tablet Take 1,000 mg by mouth every 6 (six) hours as needed for mild pain (pain score 1-3) or moderate pain (pain score 4-6).   Yes [provider]  bictegravir-emtricitabine-tenofovir AF (BIKTARVY) 50-200-25 MG TABS tablet Take 1 tablet by mouth daily. 08/19/23  Yes Veryl Speak, [name]  ibuprofen (ADVIL) 200 MG tablet Take 800 mg by mouth every 6 (six) hours as needed for mild pain (pain score 1-3) or moderate pain (pain score 4-6).   Yes [provider]  Menthol-Methyl Salicylate (ICY HOT EXTRA STRENGTH) 10-30 % CREA Apply 1 Application topically as needed (muscle pain).   Yes [provider]  naphazoline-glycerin (CLEAR EYES REDNESS) 0.012-0.25 % SOLN Place 1-2 drops into both eyes 4 (four) times daily as needed for eye irritation.   Yes [provider]    Physical Exam: Vitals:   02/22/24 1315 02/22/24 1330 02/22/24 1345 02/22/24 1409  BP: 128/66 128/65 105/61 115/66  Pulse: (!) 104 (!) 102 (!) 104 (!) 102  Resp: (!) 22 19 (!) 21 17  Temp:  100.2 F (37.9 C) 99 F (37.2 C)  TempSrc:    Oral  SpO2: 94% 94% 93% 98%  Weight:      Height:       General: Alert awake, lady family member at bedside.  Patient declined offered to proceed with further encounter in private.  Patient gives fully coherent account of his symptoms Respiratory exam: Bilateral intravesicular Cardiovascular exam S1-S2 normal Abdomen all quadrants soft nontender Extremities warm without edema right hand is dressed in a brace from forearm to figner-tips.  Little  little little medial 3 fingers are pretty well buried into the dressing but function seems to be intact in all 4 extremities   Data Reviewed:  Labs on Admission:  Results for orders placed or performed during the hospital encounter of 02/21/24 (from the past 24 hours)  Blood Culture (routine x 2)     Status: None (Preliminary result)   Collection Time: 02/21/24 11:57 PM   Specimen: BLOOD  Result Value Ref Range   Specimen Description BLOOD BLOOD RIGHT FOREARM    Special Requests      BOTTLES DRAWN AEROBIC AND ANAEROBIC Blood Culture results may not be optimal due to an inadequate volume of blood received in culture bottles   Culture      NO GROWTH < 12 HOURS Performed at [facility], [address] [place], [place] [address]    Report Status PENDING   Comprehensive metabolic panel     Status: Abnormal   Collection Time: 02/22/24 12:30 AM  Result Value Ref Range   Sodium 131 (L) 135 - 145 mmol/L   Potassium 3.5 3.5 - 5.1 mmol/L   Chloride 100 98 - 111 mmol/L   CO2 22 22 - 32 mmol/L   Glucose, Bld 113 (H) 70 - 99 mg/dL   BUN 9 6 - 20 mg/dL   Creatinine, Ser 2.95 0.61 - 1.24 mg/dL   Calcium 8.7 (L) 8.9 - 10.3 mg/dL   Total Protein 7.9 6.5 - 8.1 g/dL   Albumin 3.3 (L) 3.5 - 5.0 g/dL   AST 26 15 - 41 U/L   ALT 12 0 - 44 U/L   Alkaline Phosphatase 62 38 - 126 U/L   Total Bilirubin 0.3 0.0 - 1.2 mg/dL   GFR, Estimated >28 >41 mL/min   Anion gap 9 5 - 15  CBC with Differential     Status: Abnormal   Collection Time: 02/22/24 12:30 AM  Result Value Ref Range   WBC 7.2 4.0 - 10.5 K/uL   RBC 3.33 (L) 4.22 - 5.81 MIL/uL   Hemoglobin 10.9 (L) 13.0 - 17.0 g/dL   HCT 32.4 (L) 40.1 - 02.7 %   MCV 97.3 80.0 - 100.0 fL   MCH 32.7 26.0 - 34.0 pg   MCHC 33.6 30.0 - 36.0 g/dL   RDW 25.3 66.4 - 40.3 %   Platelets 254 150 - 400 K/uL   nRBC 0.0 0.0 - 0.2 %   Neutrophils Relative % 77 %   Neutro Abs 5.5 1.7 - 7.7 K/uL   Lymphocytes Relative 16 %   Lymphs Abs 1.1 0.7 - 4.0 K/uL    Monocytes Relative 7 %   Monocytes Absolute 0.5 0.1 - 1.0 K/uL   Eosinophils Relative 0 %   Eosinophils Absolute 0.0 0.0 - 0.5 K/uL   Basophils Relative 0 %   Basophils Absolute 0.0 0.0 - 0.1 K/uL   Immature Granulocytes 0 %   Abs Immature Granulocytes 0.03 0.00 - 0.07  K/uL  Protime-INR     Status: None   Collection Time: 02/22/24 12:30 AM  Result Value Ref Range   Prothrombin Time 14.0 11.4 - 15.2 seconds   INR 1.1 0.8 - 1.2  APTT     Status: Abnormal   Collection Time: 02/22/24 12:30 AM  Result Value Ref Range   aPTT 39 (H) 24 - 36 seconds  I-Stat Lactic Acid, ED     Status: None   Collection Time: 02/22/24 12:37 AM  Result Value Ref Range   Lactic Acid, Venous 1.2 0.5 - 1.9 mmol/L  Blood Culture (routine x 2)     Status: None (Preliminary result)   Collection Time: 02/22/24  1:03 AM   Specimen: BLOOD LEFT FOREARM  Result Value Ref Range   Specimen Description BLOOD LEFT FOREARM    Special Requests      BOTTLES DRAWN AEROBIC AND ANAEROBIC Blood Culture results may not be optimal due to an inadequate volume of blood received in culture bottles   Culture      NO GROWTH < 12 HOURS Performed at [facility], [address], [place], [place] [address]    Report Status PENDING    Basic Metabolic Panel: Recent Labs  Lab 02/22/24 0030  NA 131*  K 3.5  CL 100  CO2 22  GLUCOSE 113*  BUN 9  CREATININE 0.69  CALCIUM 8.7*   Liver Function Tests: Recent Labs  Lab 02/22/24 0030  AST 26  ALT 12  ALKPHOS 62  BILITOT 0.3  PROT 7.9  ALBUMIN 3.3*   No results for input(s): "LIPASE", "AMYLASE" in the last 168 hours. No results for input(s): "AMMONIA" in the last 168 hours. CBC: Recent Labs  Lab 02/22/24 0030  WBC 7.2  NEUTROABS 5.5  HGB 10.9*  HCT 32.4*  MCV 97.3  PLT 254   Cardiac Enzymes: No results for input(s): "CKTOTAL", "CKMB", "CKMBINDEX", "TROPONINIHS" in the last 168 hours.  BNP (last 3 results) No results for input(s): "PROBNP" in the last 8760  hours. CBG: No results for input(s): "GLUCAP" in the last 168 hours.  Radiological Exams on Admission:  DG Hand Complete Right Result Date: 02/22/2024 CLINICAL DATA:  Infection EXAM: RIGHT HAND - COMPLETE 3+ VIEW COMPARISON:  None Available. FINDINGS: There is diffuse soft tissue swelling of the hand. There is no foreign body identified. A ring is seen on the fourth finger. There is no evidence of fracture or dislocation. There is no evidence of arthropathy or other focal bone abnormality. IMPRESSION: Diffuse soft tissue swelling of the hand. No foreign body identified. Electronically Signed   By: Darliss Cheney M.D.   On: 02/22/2024 00:36    chest X-ray    I/O last 3 completed shifts: In: 100 [IV Piggyback:100] Out: -  Total I/O In: 1508.8 [I.V.:1058.8; IV Piggyback:450] Out: 10 [Blood:10]       Assessment and Plan: Right small finger/hand abscess: s/p I&D today. Awaitin or culture report.  Patient is not septic anymore.  Patient received vancomycin and cefepime in the ER.  Continue with vancomycin.  HIV: Chronic: Continue with HAART   I discussed with patient that this is a limb function threatening infection.  That in spite of the initial debridement it is possible that the patient infection may have spread proximally proximally which is not apparent during initial OR time.  And patient may need need revision of or site.  Patient may also need IV antibiotics as he may not absorb oral antibiotics appropriately.  Further we are awaiting culture results to figure out what exact antibiotics patient will need after released from the hospital.  I encouraged the patient to stay in the hospital for all these reasons to get the best outcome possible.  Hydrocodone ordered for pain. Bowel regimen ordered.   Advance Care Planning:   Code Status: Full Code   Consults: ortho  Family Communication: per pateint.  Severity of Illness: The appropriate patient status for this patient is  INPATIENT. Inpatient status is judged to be reasonable and necessary in order to provide the required intensity of service to ensure the patient's safety. The patient's presenting symptoms, physical exam findings, and initial radiographic and laboratory data in the context of their chronic comorbidities is felt to place them at high risk for further clinical deterioration. Furthermore, it is not anticipated that the patient will be medically stable for discharge from the hospital within 2 midnights of admission.   * I certify that at the point of admission it is my clinical judgment that the patient will require inpatient hospital care spanning beyond 2 midnights from the point of admission due to high intensity of service, high risk for further deterioration and high frequency of surveillance required.*  Author: Nolberto Hanlon, MD 02/22/2024 3:10 PM  For on call review [email or web address].

## 2024-02-22 NOTE — Progress Notes (Signed)
 Postop note: Status post incision and drainage right small finger and hand including flexor sheath and collar-button abscess.  Gross purulence encountered.  Cultures taken.  Wounds all irrigated and packed.  Continue IV antibiotics.  Okay for DVT prophylaxis.  Start hydrotherapy in 2 to 4 days.

## 2024-02-22 NOTE — Consult Note (Signed)
 Mark Galloway is an 38 y.o. male.   Chief Complaint: Right hand infection HPI: 38 year old male states that over the past two days he has had worsening swelling and pain of the right hand.  He does not know of any specific injuries.  He does not ruminant cuts.  He states he injects but not in that hand.  No fevers.  Allergies: No Known Allergies  Past Medical History:  Diagnosis Date   Bipolar 1 disorder ([name])    Diabetes mellitus without complication ([name])    HIV infection ([name])    Schizophrenia ([name])    Shingles     History reviewed. No pertinent surgical history.  Family History: History reviewed. No pertinent family history.  Social History:   reports that he has quit smoking. His smoking use included cigars. He has never used smokeless tobacco. He reports current alcohol use. He reports that he does not use drugs.  Medications: (Not in a hospital admission)   Results for orders placed or performed during the hospital encounter of 02/21/24 (from the past 48 hours)  Comprehensive metabolic panel     Status: Abnormal   Collection Time: 02/22/24 12:30 AM  Result Value Ref Range   Sodium 131 (L) 135 - 145 mmol/L   Potassium 3.5 3.5 - 5.1 mmol/L   Chloride 100 98 - 111 mmol/L   CO2 22 22 - 32 mmol/L   Glucose, Bld 113 (H) 70 - 99 mg/dL    Comment: Glucose reference range applies only to samples taken after fasting for at least 8 hours.   BUN 9 6 - 20 mg/dL   Creatinine, Ser 0.45 0.61 - 1.24 mg/dL   Calcium 8.7 (L) 8.9 - 10.3 mg/dL   Total Protein 7.9 6.5 - 8.1 g/dL   Albumin 3.3 (L) 3.5 - 5.0 g/dL   AST 26 15 - 41 U/L   ALT 12 0 - 44 U/L   Alkaline Phosphatase 62 38 - 126 U/L   Total Bilirubin 0.3 0.0 - 1.2 mg/dL   GFR, Estimated >40 >98 mL/min    Comment: (NOTE) Calculated using the CKD-EPI Creatinine Equation (2021)    Anion gap 9 5 - 15    Comment: Performed at [facility], [address], [place], [place] [address]  CBC with Differential     Status:  Abnormal   Collection Time: 02/22/24 12:30 AM  Result Value Ref Range   WBC 7.2 4.0 - 10.5 K/uL   RBC 3.33 (L) 4.22 - 5.81 MIL/uL   Hemoglobin 10.9 (L) 13.0 - 17.0 g/dL   HCT 78.2 (L) 95.6 - 21.3 %   MCV 97.3 80.0 - 100.0 fL   MCH 32.7 26.0 - 34.0 pg   MCHC 33.6 30.0 - 36.0 g/dL   RDW 08.6 57.8 - 46.9 %   Platelets 254 150 - 400 K/uL   nRBC 0.0 0.0 - 0.2 %   Neutrophils Relative % 77 %   Neutro Abs 5.5 1.7 - 7.7 K/uL   Lymphocytes Relative 16 %   Lymphs Abs 1.1 0.7 - 4.0 K/uL   Monocytes Relative 7 %   Monocytes Absolute 0.5 0.1 - 1.0 K/uL   Eosinophils Relative 0 %   Eosinophils Absolute 0.0 0.0 - 0.5 K/uL   Basophils Relative 0 %   Basophils Absolute 0.0 0.0 - 0.1 K/uL   Immature Granulocytes 0 %   Abs Immature Granulocytes 0.03 0.00 - 0.07 K/uL    Comment: Performed at [facility], [address], [place], [place] [address]  Protime-INR     Status: None   Collection Time: 02/22/24 12:30 AM  Result Value Ref Range   Prothrombin Time 14.0 11.4 - 15.2 seconds   INR 1.1 0.8 - 1.2    Comment: (NOTE) INR goal varies based on device and disease states. Performed at [facility], [address], [place], [place] [address]   APTT     Status: Abnormal   Collection Time: 02/22/24 12:30 AM  Result Value Ref Range   aPTT 39 (H) 24 - 36 seconds    Comment:        IF BASELINE aPTT IS ELEVATED, SUGGEST PATIENT RISK ASSESSMENT BE USED TO DETERMINE APPROPRIATE ANTICOAGULANT THERAPY. Performed at [facility], [address], [place], [place] [address]   I-Stat Lactic Acid, ED     Status: None   Collection Time: 02/22/24 12:37 AM  Result Value Ref Range   Lactic Acid, Venous 1.2 0.5 - 1.9 mmol/L    DG Hand Complete Right Result Date: 02/22/2024 CLINICAL DATA:  Infection EXAM: RIGHT HAND - COMPLETE 3+ VIEW COMPARISON:  None Available. FINDINGS: There is diffuse soft tissue swelling of the hand. There is no foreign body identified. A ring is seen on the fourth  finger. There is no evidence of fracture or dislocation. There is no evidence of arthropathy or other focal bone abnormality. IMPRESSION: Diffuse soft tissue swelling of the hand. No foreign body identified. Electronically Signed   By: Darliss Cheney M.D.   On: 02/22/2024 00:36      Blood pressure 127/67, pulse 94, temperature 98.6 F (37 C), temperature source Oral, resp. rate 20, height 5\' 9"  (1.753 m), weight 74.8 kg, SpO2 96%.  General appearance: alert, cooperative, and appears stated age Head: Normocephalic, without obvious abnormality, atraumatic Neck: supple, symmetrical, trachea midline Extremities: Intact sensation and capillary refill all digits.  +epl/fpl/io.  In the right hand he is able to move all digits with the exception of the small finger where motion is decreased.  The small finger is swollen.  There is ecchymotic fluctuance over the proximal phalanx.  Tender to palpation over the small finger.  He is not however painful with passive extension of the finger.  Flexion or passive flexion is painful secondary to the swelling.  He is swollen on the dorsum of the hand and is tender on the dorsum of the hand.  He is not tender in the palm of the hand other than over the small finger at the MP joint.  He is not tender on the ulnar border of the hand or at the wrist.  No proximal streaking.  No wounds noted in the hand. Skin: Skin color, texture, turgor normal. No rashes or lesions Neurologic: Grossly normal Incision/Wound: none  Assessment/Plan Right small finger and hand infection.  Recommend incision and drainage in the operating room.  This will include the small finger and possibly the flexor sheath depending on appearance.  May also need incision on the dorsum of the hand or finger.  Risks, benefits and alternatives of surgery were discussed including risks of blood loss, infection, damage to nerves/vessels/tendons/ligament/bone, failure of surgery, need for additional surgery,  complication with wound healing, stiffness, need for repeat irrigation and debridement.  He voiced understanding of these risks and elected to proceed.  He has been admitted by the hospitalist for IV antibiotics.  To OR once n.p.o. status is appropriate.   Betha Loa 02/22/2024, 4:50 AM

## 2024-02-22 NOTE — Progress Notes (Addendum)
 Pharmacy Antibiotic Note  Mark Galloway is a 38 y.o. male admitted on 02/21/2024 with  right small finger and hand infection .  Pharmacy has been consulted for Vancomycin dosing.  Received Vancomycin 1.5g and Cefepime 2g x1 each around 2:30 AM. WBC wnl. ANC 5.5. LA <2. Afebrile.   Plan: Vancomycin 1750mg  IV every 12 hours Monitor renal function and adjust if needed.   Height: 5\' 9"  (175.3 cm) Weight: 74.8 kg (165 lb) IBW/kg (Calculated) : 70.7  Temp (24hrs), Avg:98.8 F (37.1 C), Min:98.5 F (36.9 C), Max:99.2 F (37.3 C)  Recent Labs  Lab 02/22/24 0030 02/22/24 0037  WBC 7.2  --   CREATININE 0.69  --   LATICACIDVEN  --  1.2    Estimated Creatinine Clearance: 126.4 mL/min (by C-G formula based on SCr of 0.69 mg/dL).    Allergies  Allergen Reactions   Peanut Oil Hives   Peanut-Containing Drug Products     Antimicrobials this admission: Vancomycin 2/22 >> Cefepime 2/22 x1  Dose adjustments this admission:   Microbiology results: 2/21 BCx:    Thank you for allowing pharmacy to be a part of this patient's care.  Link Snuffer, PharmD, BCPS, BCCCP Please refer to [facility] for [facility] numbers 02/22/2024 9:11 AM   Addendum: Pharmacy consulted to add cefazolin. Cefepime dose given at 02:21. Aslo s/p I&D right small finger and hand collar-button abscess. Culture is pending.   Cefazolin 2 g IV q8h  Loura Back, PharmD, BCPS 2:56 PM

## 2024-02-22 NOTE — ED Notes (Signed)
 MD states patient could eat, RN provided patient with sandwich. After talking with surgeon, MD stated patient could not eat. RN updated patient on NPO status and patient asked if he could put sandwich in his lunchbox for later. At this time he had taken one bite of sandwich. RN then came in the room and found patient had retrieved sandwich out of lunch box, ate the entire sandwich, and 2 packs of fruit snacks he brought with him. Education provided about NPO status and risks of eating before surgery.

## 2024-02-22 NOTE — Discharge Instructions (Signed)

## 2024-02-22 NOTE — Anesthesia Preprocedure Evaluation (Addendum)
 Anesthesia Evaluation  Patient identified by MRN, date of birth, ID band Patient awake    Reviewed: Allergy & Precautions, NPO status , Patient's Chart, lab work & pertinent test results  Airway Mallampati: III  TM Distance: >3 FB Neck ROM: Full    Dental  (+) Dental Advisory Given   Pulmonary Current Smoker, former smoker   Pulmonary exam normal breath sounds clear to auscultation       Cardiovascular pulmonary hypertension Rhythm:Regular Rate:Tachycardia + Systolic murmurs    Neuro/Psych   Anxiety  Bipolar Disorder Schizophrenia  negative neurological ROS     GI/Hepatic negative GI ROS, Neg liver ROS,,,  Endo/Other  diabetes    Renal/GU negative Renal ROS     Musculoskeletal negative musculoskeletal ROS (+)    Abdominal   Peds  Hematology  (+) HIV  Anesthesia Other Findings   Reproductive/Obstetrics                             Anesthesia Physical Anesthesia Plan  ASA: 4  Anesthesia Plan: General   Post-op Pain Management: Ofirmev IV (intra-op)*   Induction: Intravenous, Rapid sequence and Cricoid pressure planned  PONV Risk Score and Plan: 2 and Ondansetron, Dexamethasone and Treatment may vary due to age or medical condition  Airway Management Planned: Oral ETT  Additional Equipment: None  Intra-op Plan:   Post-operative Plan: Extubation in OR  Informed Consent: I have reviewed the patients History and Physical, chart, labs and discussed the procedure including the risks, benefits and alternatives for the proposed anesthesia with the patient or authorized representative who has indicated his/her understanding and acceptance.     Dental advisory given  Plan Discussed with: CRNA  Anesthesia Plan Comments:         Anesthesia Quick Evaluation

## 2024-02-22 NOTE — ED Notes (Signed)
 Delay in blood draw,  pt wants something for pain.  KM

## 2024-02-22 NOTE — ED Notes (Signed)
 RN removed ring with use of ring cutter.

## 2024-02-22 NOTE — ED Notes (Signed)
 Pt educated on importance of staying NPO for surgery.

## 2024-02-22 NOTE — Transfer of Care (Signed)
 Immediate Anesthesia Transfer of Care Note  Patient: Mark Galloway  Procedure(s) Performed: IRRIGATION AND DEBRIDEMENT EXTREMITY (Right)  Patient Location: PACU  Anesthesia Type:General  Level of Consciousness: awake and alert   Airway & Oxygen Therapy: Patient Spontanous Breathing  Post-op Assessment: Report given to RN and Post -op Vital signs reviewed and stable  Post vital signs: Reviewed and stable  Last Vitals:  Vitals Value Taken Time  BP 134/64 02/22/24 1305  Temp    Pulse 104 02/22/24 1308  Resp 18 02/22/24 1308  SpO2 96 % 02/22/24 1308  Vitals shown include unfiled device data.  Last Pain:  Vitals:   02/22/24 0956  TempSrc:   PainSc: Asleep         Complications: There were no known notable events for this encounter.

## 2024-02-22 NOTE — Anesthesia Postprocedure Evaluation (Signed)
 Anesthesia Post Note  Patient: Mark Galloway  Procedure(s) Performed: IRRIGATION AND DEBRIDEMENT EXTREMITY (Right)     Patient location during evaluation: PACU Anesthesia Type: General Level of consciousness: sedated and patient cooperative Pain management: pain level controlled Vital Signs Assessment: post-procedure vital signs reviewed and stable Respiratory status: spontaneous breathing Cardiovascular status: stable Anesthetic complications: no   There were no known notable events for this encounter.  Last Vitals:  Vitals:   02/22/24 1409 02/22/24 1529  BP: 115/66 125/72  Pulse: (!) 102 (!) 110  Resp: 17 16  Temp: 37.2 C 37.9 C  SpO2: 98% 100%    Last Pain:  Vitals:   02/22/24 1529  TempSrc: Oral  PainSc:                  [name]

## 2024-02-22 NOTE — ED Notes (Signed)
 Consent signed by patient, surgeon, and witnessed by primary RN. Kept at patient's bedside table.

## 2024-02-22 NOTE — Op Note (Signed)
 NAME: Mark Galloway MEDICAL RECORD NO: [id number] DATE OF BIRTH: Sep 10, 1986 FACILITY: [facility] LOCATION: [facility] OR PHYSICIAN: Tami Ribas, MD   OPERATIVE REPORT   DATE OF PROCEDURE: 02/22/24    PREOPERATIVE DIAGNOSIS: Right small finger/hand abscess   POSTOPERATIVE DIAGNOSIS: 1.  Right small finger flexor sheath infection 2.  Right hand collar-button abscess at ring and small finger   PROCEDURE: 1.  Incision and drainage right small finger including flexor sheath 2.  Incision and drainage right hand collar-button abscess   SURGEON:  Betha Loa, M.D.   ASSISTANT: none   ANESTHESIA:  General   INTRAVENOUS FLUIDS:  Per anesthesia flow sheet.   ESTIMATED BLOOD LOSS:  Minimal.   COMPLICATIONS:  None.   SPECIMENS: Cultures to micro   TOURNIQUET TIME:    Total Tourniquet Time Documented: Upper Arm (Right) - 57 minutes Total: Upper Arm (Right) - 57 minutes    DISPOSITION:  Stable to PACU.   INDICATIONS: 38 year old male states that over the past 2 days he has had increasing swelling and pain of the right hand.  He does not remember any specific injuries.  He states he injects but has not used that hand.  I recommended incision and drainage in the operating room.  Risks, benefits and alternatives of surgery were discussed including the risks of blood loss, infection, damage to nerves, vessels, tendons, ligaments, bone for surgery, need for additional surgery, complications with wound healing, continued pain, stiffness, , need for repeat irrigation and debridement.  He voiced understanding of these risks and elected to proceed.  OPERATIVE COURSE:  After being identified preoperatively by myself,  the patient and I agreed on the procedure and site of the procedure.  The surgical site was marked.  Surgical consent had been signed.  He is on scheduled antibiotics.  He was transferred to the operating room and placed on the operating table in supine position with the Right upper  extremity on an arm board.  General anesthesia was induced by the anesthesiologist.  Right upper extremity was prepped and draped in normal sterile orthopedic fashion.  A surgical pause was performed between the surgeons, anesthesia, and operating room staff and all were in agreement as to the patient, procedure, and site of procedure.  Tourniquet at the proximal aspect of the extremity was inflated to 250 mmHg after exsanguination of the arm with an Esmarch bandage.  Incision was made at the volar aspect of the small finger.  There was fluid underneath the epidermis which had delaminated.  Cultures were taken.  Incision was then made through the dermis.  There was gross purulence in the small finger at the level of the proximal phalanx and volar to the MP joint.  Cultures were taken in this area as well.  The wound was extended in a [place] fashion into the distal palm and distally onto the finger tracking the purulence.  This tracked along the neurovascular bundles more so on the radial side.  The sheath was exposed.  The distal forearm and palm were milked and no fluid or purulence came from within the sheath from proximally.  The purulence coursed along the radial side of the finger near the MP joint dorsally onto the hand.  The wound was debrided using the pickups and scissors and Ray-Tec sponge to remove any devitalized tissues.  The radial and ulnar digital nerve and artery were identified and protected throughout the case.  Incision was then made on the dorsum of the hand where the purulence  was tracking.  This tracked onto the dorsum of the hand along the extensor tendon.  Additional small incision was made on the dorsum of the proximal phalanx to allow better drainage.  The dorsal hand wound was also debrided to remove any devitalized tissues using the pickups and scissors.  Bipolar electrocautery was used to obtain hemostasis.  A #5 pediatric feeding tube was placed into the flexor sheath a proximal  direction.  This tracked approximately 4 cm and could not be threaded any farther.  This was used to irrigate the flexor sheath from proximally.  No purulence was encountered.  The feeding tube was then threaded into the flexor sheath in a distal direction.  Again this was used to irrigate the flexor sheath.  Good effluent was obtained.  The wounds were all then copiously irrigated with sterile saline by cystoscopy tubing.  3000 cc of irrigant was used.  The wounds were then all packed with half-inch iodoform gauze.  Two 4-0 nylon sutures were used in the finger at the corners of the incision to prevent wound edge retraction.  The skin was not approximated.  The wounds were then injected with quarter percent plain Marcaine to aid in postoperative analgesia.  Raw areas of skin were dressed with sterile Xeroform and the wounds all dressed with sterile 4 x 4's and ABD and wrapped with a Kerlix bandage.  Volar splint was placed and wrapped with Kerlix and Ace bandage.  The tourniquet was deflated at 57 minutes.  Fingertips were pink with brisk capillary refill after deflation of tourniquet.  The operative  drapes were broken down.  The patient was awoken from anesthesia safely.  He was transferred back to the stretcher and taken to PACU in stable condition.  He is currently admitted to the hospitalist for IV antibiotics.     Betha Loa, MD Electronically signed, 02/22/24

## 2024-02-22 NOTE — ED Provider Notes (Signed)
 [place] EMERGENCY DEPARTMENT AT [name] Provider Note   CSN: [id number] Arrival date & time: 02/21/24  2339     History  Chief Complaint  Patient presents with   hand swelling    Mark Galloway is a 38 y.o. male.  Patient with a history of HIV, bipolar disorder, diabetes, noncompliance with medications here with right hand pain and swelling for the past 1 day.  Denies any injury.  Initially denies but does eventually admit to injecting methamphetamine today but denies injecting into the hand itself and usually uses his forearms.  Pain and unable to move digits.  No fever.  No chest pain or shortness of breath.  States he takes his Biktarvy intermittently and does not know his CD4 count. No headache, chest pain, shortness of breath, nausea, vomiting, abdominal pain. No history of similar.  The history is provided by the patient.       Home Medications Prior to Admission medications   Medication Sig Start Date End Date Taking? Authorizing Provider  acetaminophen (TYLENOL) 500 MG tablet Take 2 tablets (1,000 mg total) by mouth every 6 (six) hours as needed. Patient not taking: Reported on 03/08/2023 04/17/21   Arby Barrette, MD  bictegravir-emtricitabine-tenofovir AF (BIKTARVY) 50-200-25 MG TABS tablet Take 1 tablet by mouth daily. 08/19/23   Veryl Speak, [name]  fluconazole (DIFLUCAN) 100 MG tablet Take 2 tablets (200 mg total) by mouth daily. 11/07/22   Veryl Speak, [name]  hydrOXYzine (VISTARIL) 50 MG capsule Take 50 mg by mouth every 6 (six) hours as needed. For anxiety 09/30/22   [provider]  nystatin (MYCOSTATIN) 100000 UNIT/ML suspension Take 5 mLs (500,000 Units total) by mouth 4 (four) times daily. 10/02/22   Clark, Meghan R, PA-C  risperiDONE (RISPERDAL) 0.5 MG tablet Take 0.5 mg by mouth 2 (two) times daily. 09/30/22   [provider]  sulfamethoxazole-trimethoprim (BACTRIM DS) 800-160 MG tablet Take 1 tablet by mouth daily. 08/19/23    Veryl Speak, [name]  traZODone (DESYREL) 50 MG tablet Take 50 mg by mouth at bedtime. Patient not taking: Reported on 03/08/2023 09/30/22   [provider]      Allergies    Patient has no known allergies.    Review of Systems   Review of Systems  Constitutional:  Negative for activity change, appetite change and fever.  HENT:  Negative for congestion and rhinorrhea.   Respiratory:  Negative for cough, chest tightness and shortness of breath.   Cardiovascular:  Negative for chest pain.  Gastrointestinal:  Negative for abdominal pain, nausea and vomiting.  Genitourinary:  Negative for dysuria and hematuria.  Musculoskeletal:  Positive for arthralgias and myalgias.  Skin:  Positive for wound.  Neurological:  Negative for dizziness, weakness and headaches.   all other systems are negative except as noted in the HPI and PMH.    Physical Exam Updated Vital Signs BP 103/81 (BP Location: Left Arm)   Pulse 100   Temp 98.5 F (36.9 C) (Oral)   Resp 20   Ht 5\' 9"  (1.753 m)   Wt 74.8 kg   SpO2 98%   BMI 24.37 kg/m  Physical Exam Vitals and nursing note reviewed.  Constitutional:      General: He is not in acute distress.    Appearance: He is well-developed.  HENT:     Head: Normocephalic and atraumatic.     Mouth/Throat:     Pharynx: No oropharyngeal exudate.  Eyes:     Conjunctiva/sclera:  Conjunctivae normal.     Pupils: Pupils are equal, round, and reactive to light.  Neck:     Comments: No meningismus. Cardiovascular:     Rate and Rhythm: Normal rate and regular rhythm.     Heart sounds: Normal heart sounds. No murmur heard. Pulmonary:     Effort: Pulmonary effort is normal. No respiratory distress.     Breath sounds: Normal breath sounds.  Abdominal:     Palpations: Abdomen is soft.     Tenderness: There is no abdominal tenderness. There is no guarding or rebound.  Musculoskeletal:        General: Swelling, tenderness and signs of injury present.      Cervical back: Normal range of motion and neck supple.     Comments: Right hand diffusely swollen as depicted.  Blistering overlying the fourth and fifth digits on the palmar surface.  Unable to range PIP, DIP or MCP joints fourth and fifth digits.  Intact radial pulse. Will cut off ring  Skin:    General: Skin is warm.  Neurological:     Mental Status: He is alert and oriented to person, place, and time.     Cranial Nerves: No cranial nerve deficit.     Motor: No abnormal muscle tone.     Coordination: Coordination normal.     Comments: No ataxia on finger to nose bilaterally. No pronator drift. 5/5 strength throughout. CN 2-12 intact.Equal grip strength. Sensation intact.   Psychiatric:        Behavior: Behavior normal.        ED Results / Procedures / Treatments   Labs (all labs ordered are listed, but only abnormal results are displayed) Labs Reviewed  COMPREHENSIVE METABOLIC PANEL - Abnormal; Notable for the following components:      Result Value   Sodium 131 (*)    Glucose, Bld 113 (*)    Calcium 8.7 (*)    Albumin 3.3 (*)    All other components within normal limits  CBC WITH DIFFERENTIAL/PLATELET - Abnormal; Notable for the following components:   RBC 3.33 (*)    Hemoglobin 10.9 (*)    HCT 32.4 (*)    All other components within normal limits  APTT - Abnormal; Notable for the following components:   aPTT 39 (*)    All other components within normal limits  CULTURE, BLOOD (ROUTINE X 2)  CULTURE, BLOOD (ROUTINE X 2)  PROTIME-INR  I-STAT CG4 LACTIC ACID, ED  I-STAT CG4 LACTIC ACID, ED    EKG None  Radiology DG Hand Complete Right Result Date: 02/22/2024 CLINICAL DATA:  Infection EXAM: RIGHT HAND - COMPLETE 3+ VIEW COMPARISON:  None Available. FINDINGS: There is diffuse soft tissue swelling of the hand. There is no foreign body identified. A ring is seen on the fourth finger. There is no evidence of fracture or dislocation. There is no evidence of arthropathy or  other focal bone abnormality. IMPRESSION: Diffuse soft tissue swelling of the hand. No foreign body identified. Electronically Signed   By: Darliss Cheney M.D.   On: 02/22/2024 00:36    Procedures .Foreign Body Removal  Date/Time: 02/22/2024 2:25 AM  Performed by: Glynn Octave, MD Authorized by: Glynn Octave, MD  Consent: Verbal consent obtained. Risks and benefits: risks, benefits and alternatives were discussed Consent given by: patient Patient understanding: patient states understanding of the procedure being performed Site marked: the operative site was marked Imaging studies: imaging studies available Required items: required blood products, implants, devices, and special  equipment available Patient identity confirmed: verbally with patient Body area: skin General location: upper extremity Location details: right ring finger  Sedation: Patient sedated: no  Patient restrained: no Patient cooperative: yes Localization method: visualized and serial x-rays Dressing: antibiotic ointment Depth: subcutaneous Complexity: simple 1 objects recovered. Objects recovered: ring Post-procedure assessment: foreign body removed Patient tolerance: patient tolerated the procedure well with no immediate complications Comments: R removed with difficulty using ring cutter, screwdriver, bolt cutters  .Critical Care  Performed by: Glynn Octave, MD Authorized by: Glynn Octave, MD   Critical care provider statement:    Critical care time (minutes):  45   Critical care time was exclusive of:  Separately billable procedures and treating other patients   Critical care was time spent personally by me on the following activities:  Development of treatment plan with patient or surrogate, discussions with consultants, evaluation of patient's response to treatment, examination of patient, ordering and review of laboratory studies, ordering and review of radiographic studies, ordering and  performing treatments and interventions, pulse oximetry, re-evaluation of patient's condition, review of old charts, blood draw for specimens and obtaining history from patient or surrogate   I assumed direction of critical care for this patient from another provider in my specialty: no     Care discussed with: admitting provider       Medications Ordered in ED Medications  ceFEPIme (MAXIPIME) 1 g in sodium chloride 0.9 % 100 mL IVPB (has no administration in time range)  vancomycin (VANCOCIN) IVPB 1000 mg/200 mL premix (has no administration in time range)  Tdap (BOOSTRIX) injection 0.5 mL (has no administration in time range)    ED Course/ Medical Decision Making/ A&P                                 Medical Decision Making Amount and/or Complexity of Data Reviewed Labs: ordered. Decision-making details documented in ED Course. Radiology: ordered and independent interpretation performed. Decision-making details documented in ED Course. ECG/medicine tests: ordered and independent interpretation performed. Decision-making details documented in ED Course.  Risk OTC drugs. Prescription drug management. Decision regarding hospitalization.   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  Stable vital signs.  Will obtain labs with blood cultures, start broad-spectrum antibiotics.  Remove ring.  X-ray negative for fracture or foreign body.  Results reviewed and interpreted by me.  Patient given broad-spectrum antibiotics.  Lab work is obtained.  Discussed with Dr. Merlyn Lot of hand surgery.  He will evaluate for likely irrigation debridement in the OR later tonight.  Some concern for deep space infection and possible flexor tenosynovitis of fourth and fifth digits.  Ring was removed with much difficulty.  Admit for IV antibiotics. D/w Dr. Antionette Char.         Final Clinical Impression(s) / ED Diagnoses Final diagnoses:  Cellulitis of hand,  right    Rx / DC Orders ED Discharge Orders     None         Nianna Igo, Jeannett Senior, MD 02/22/24 [phone]

## 2024-02-23 LAB — CBC
HCT: 31.3 % — ABNORMAL LOW (ref 39.0–52.0)
Hemoglobin: 10.5 g/dL — ABNORMAL LOW (ref 13.0–17.0)
MCH: 32.6 pg (ref 26.0–34.0)
MCHC: 33.5 g/dL (ref 30.0–36.0)
MCV: 97.2 fL (ref 80.0–100.0)
Platelets: 267 10*3/uL (ref 150–400)
RBC: 3.22 MIL/uL — ABNORMAL LOW (ref 4.22–5.81)
RDW: 14.9 % (ref 11.5–15.5)
WBC: 10 10*3/uL (ref 4.0–10.5)
nRBC: 0 % (ref 0.0–0.2)

## 2024-02-23 LAB — APTT: aPTT: 39 s — ABNORMAL HIGH (ref 24–36)

## 2024-02-23 LAB — BASIC METABOLIC PANEL
Anion gap: 7 (ref 5–15)
BUN: 13 mg/dL (ref 6–20)
CO2: 23 mmol/L (ref 22–32)
Calcium: 9.4 mg/dL (ref 8.9–10.3)
Chloride: 106 mmol/L (ref 98–111)
Creatinine, Ser: 0.8 mg/dL (ref 0.61–1.24)
GFR, Estimated: >60 mL/min (ref >60)
Glucose, Bld: 123 mg/dL — ABNORMAL HIGH (ref 70–99)
Potassium: 4 mmol/L (ref 3.5–5.1)
Sodium: 136 mmol/L (ref 135–145)

## 2024-02-23 LAB — PROTIME-INR
INR: 1.1 (ref 0.8–1.2)
Prothrombin Time: 14.7 s (ref 11.4–15.2)

## 2024-02-23 MED ORDER — NYSTATIN 100000 UNIT/ML MT SUSP: 5.0000 mL | Freq: Four times a day (QID) | OROMUCOSAL | 0 refills | Status: DC

## 2024-02-23 MED ORDER — ASCORBIC ACID 1000 MG PO TABS: 1000.0000 mg | ORAL_TABLET | Freq: Every day | ORAL | Status: DC

## 2024-02-23 MED ORDER — HYDROCODONE-ACETAMINOPHEN 7.5-325 MG PO TABS: 1.0000 | ORAL_TABLET | Freq: Four times a day (QID) | ORAL | 0 refills | Status: DC | PRN

## 2024-02-23 MED ORDER — DOXYCYCLINE HYCLATE 100 MG PO TABS: 100.0000 mg | ORAL_TABLET | Freq: Two times a day (BID) | ORAL | 0 refills | Status: DC

## 2024-02-23 MED ORDER — DOCUSATE SODIUM 100 MG PO CAPS: 100.0000 mg | ORAL_CAPSULE | Freq: Two times a day (BID) | ORAL | 0 refills | Status: DC

## 2024-02-23 MED ORDER — ORAL CARE MOUTH RINSE: 15.0000 mL | OROMUCOSAL | Status: DC | PRN

## 2024-02-23 MED ORDER — DOCUSATE SODIUM 100 MG PO CAPS
100.0000 mg | ORAL_CAPSULE | Freq: Two times a day (BID) | ORAL | Status: DC
  Administered 2024-02-23 – 2024-02-24 (×3): 100 mg via ORAL
  Filled 2024-02-23 (×3): qty 1

## 2024-02-23 MED ORDER — PNEUMOCOCCAL 20-VAL CONJ VACC 0.5 ML IM SUSY
0.5000 mL | PREFILLED_SYRINGE | INTRAMUSCULAR | Status: AC
  Administered 2024-02-24: 0.5 mL via INTRAMUSCULAR
  Filled 2024-02-23: qty 0.5

## 2024-02-23 MED ORDER — NYSTATIN 100000 UNIT/ML MT SUSP
5.0000 mL | Freq: Four times a day (QID) | OROMUCOSAL | Status: DC
  Administered 2024-02-23 – 2024-02-24 (×5): 500000 [IU] via ORAL
  Filled 2024-02-23 (×5): qty 5

## 2024-02-23 NOTE — Progress Notes (Signed)
 Contacted on call ortho and notified that pt can be discharged from ortho standpoint

## 2024-02-23 NOTE — Hospital Course (Signed)
 Mark Galloway is a 38 y.o. male with past medical history of HIV on HAAR presented to hospital with right hand pain swelling and blistering on the morning of 02/21/2024 without any fever.  Patient wonder whether there was an insect bite and denied using IV drugs.  In the ED patient was noted to be tachycardic with fever of 100.2 F and there was some concern over abscess of the right little finger.  Patient was then emergently taken to the OR by orthopedic surgery.  Patient was noted to have right small finger flexor sheath infection and abscess at the ring and small finger.  I&D was done on 12/21/2024 and patient was admitted to hospital for further evaluation and treatment.   Right small finger/hand abscess status post I&D.  Gross purulence were noted.  Await cultures.  Received vancomycin and cefepime in the ED.  Has been continued on vancomycin.  Orthopedic as recommended IV antibiotic.  Plan for hydrotherapy in 2 to 4 days.  No leukocytosis at this time.  Temperature max as 100.2 F.  HIV.  Continue with highly active antiretroviral treatment.

## 2024-02-23 NOTE — TOC Transition Note (Signed)
 Transition of Care [facility]) - Discharge Note   Patient Details  Name: Mark Galloway MRN: [id number] Date of Birth: 1986-08-22  [facility] CM/SW Contact:  Ronny Bacon, RN Phone Number: 02/23/2024, 4:29 PM   Clinical Narrative:   Spoke with patient regarding consult for HIV meds needing refills. Patient was seen by [facility] in August and had phone encounter with them in December. Patient instructed to call [facility] tomorrow morning to discuss getting his medication refilled. Patient verbalized understanding.          Patient Goals and CMS Choice            Discharge Placement                       Discharge Plan and Services Additional resources added to the After Visit Summary for                                       Social Drivers of Health (SDOH) Interventions SDOH Screenings   Food Insecurity: Food Insecurity Present (02/23/2024)  Housing: High Risk (02/23/2024)  Transportation Needs: Unmet Transportation Needs (02/23/2024)  Utilities: At Risk (02/23/2024)  Alcohol Screen: Low Risk  (03/29/2020)  Depression (PHQ2-9): Low Risk  (03/08/2023)  Financial Resource Strain: High Risk (01/04/2024)   Received from [name]  Social Connections: Unknown (05/04/2022)   Received from [facility]  Tobacco Use: High Risk (02/22/2024)     Readmission Risk Interventions     No data to display

## 2024-02-23 NOTE — Progress Notes (Signed)
 Spoke to patients mother on the phone she states she does not want him to be discharged because she believes he is in mental health crisis and she is afraid for the safety of his hand after surgery. She stated she is the healthcare power of attorney. She request we have him evaluated by psychiatry or if we can keep him for the night she will have him taken to [address] [facility] in the morning. I handed off this report to night shift nurse Clydie Braun, RN and contacted the on call hospitalist Johann Capers, NP

## 2024-02-23 NOTE — Progress Notes (Addendum)
 PROGRESS NOTE  Mark Galloway NUU:[id number] DOB: 01-Jan-1986 DOA: 02/21/2024 PCP: Pcp, No   LOS: 1 day   Brief narrative:  Mark Galloway is a 38 y.o. male with past medical history of HIV on HAART presented to hospital with right hand pain swelling and blistering on the morning of 02/21/2024 without any fever.  Patient wonder whether there was an insect bite and denied using IV drugs.  In the ED patient was noted to be tachycardic with fever of 100.2 F and there was some concern over abscess of the right little finger.  Patient was then emergently taken to the OR by orthopedic surgery.  Patient was noted to have right small finger flexor sheath infection and abscess at the ring and small finger.  I&D was done on 02/22/2024 and patient was admitted to hospital for further evaluation and treatment.  Assessment/Plan: Principal Problem:   Cellulitis of right hand Active Problems:   Cellulitis  Right small finger/hand abscess status post I&D.  Gross purulence were noted.  Await cultures.  Received vancomycin and cefepime in the ED.  Has been continued on vancomycin.  Blood cultures negative in 1 day.  Aerobic anaerobic culture from the surgical wound showed moderate Staph aureus.  Sensitivity pending.  Orthopedic as recommended IV antibiotic.  Plan for hydrotherapy in 2 to 4 days.  No leukocytosis at this time.  Temperature max as 100.2 F in the last 24 hours but latest temperature of 98.2 F..  Will follow orthopedic recommendation.  HIV.  Continue with highly active antiretroviral treatment.   DVT prophylaxis: enoxaparin (LOVENOX) injection 40 mg Start: 02/23/24 0800 SCD's Start: 02/22/24 1420 SCDs Start: 02/22/24 0900   Disposition: Home when okay with orthopedics  Status is: Inpatient Remains inpatient appropriate because: IV antibiotic, pending clinical improvement,    Code Status:     Code Status: Full Code  Family Communication: None at bedside  Consultants: Hand  surgery  Procedures: Incision and drainage of right hand on 02/22/2024  Anti-infectives:  Biktarvy  IV vancomycin  Anti-infectives (From admission, onward)    Start     Dose/Rate Route Frequency Ordered Stop   02/22/24 1600  ceFAZolin (ANCEF) IVPB 2g/100 mL premix        2 g 200 mL/hr over 30 Minutes Intravenous Every 8 hours 02/22/24 1455     02/22/24 1545  bictegravir-emtricitabine-tenofovir AF (BIKTARVY) 50-200-25 MG per tablet 1 tablet        1 tablet Oral Daily 02/22/24 1454     02/22/24 1200  vancomycin (VANCOREADY) IVPB 1750 mg/350 mL        1,750 mg 175 mL/hr over 120 Minutes Intravenous Every 12 hours 02/22/24 0916     02/22/24 1041  vancomycin (VANCOCIN) 1-5 GM/200ML-% IVPB       Note to Pharmacy: Kathrene Bongo D: cabinet override      02/22/24 1041 02/22/24 1342   02/22/24 0115  ceFEPIme (MAXIPIME) 2 g in sodium chloride 0.9 % 100 mL IVPB        2 g 200 mL/hr over 30 Minutes Intravenous  Once 02/22/24 0110 02/22/24 0245   02/22/24 0115  vancomycin (VANCOCIN) IVPB 1000 mg/200 mL premix  Status:  Discontinued        1,000 mg 200 mL/hr over 60 Minutes Intravenous  Once 02/22/24 0110 02/22/24 0114   02/22/24 0115  vancomycin (VANCOREADY) IVPB 1500 mg/300 mL        1,500 mg 150 mL/hr over 120 Minutes Intravenous  Once 02/22/24 0114 02/22/24 0553  Subjective: Today, patient was seen and examined at bedside.  Complains of mild finger discomfort.  Still has some numbness to the finger.  Denies any shortness of breath nausea vomiting abdominal pain  Objective: Vitals:   02/23/24 0013 02/23/24 0430  BP: 117/69 101/66  Pulse: 93 82  Resp: 16 18  Temp: 98.4 F (36.9 C) 98.2 F (36.8 C)  SpO2: 100% 100%    Intake/Output Summary (Last 24 hours) at 02/23/2024 1019 Last data filed at 02/23/2024 0900 Gross per 24 hour  Intake 2811.75 ml  Output 10 ml  Net 2801.75 ml   Filed Weights   02/21/24 2343 02/22/24 1409  Weight: 74.8 kg 74.8 kg   Body mass index  is 24.35 kg/m.   Physical Exam:  GENERAL: Patient is alert awake and oriented. Not in obvious distress. HENT: No scleral pallor or icterus. Pupils equally reactive to light. Oral mucosa is moist NECK: is supple, no gross swelling noted. CHEST: Clear to auscultation. No crackles or wheezes.  CVS: S1 and S2 heard, no murmur. Regular rate and rhythm.  ABDOMEN: Soft, non-tender, bowel sounds are present. EXTREMITIES: No edema. CNS: Cranial nerves are intact. No focal motor deficits. SKIN: warm and dry, right hand covered with clean bandage.  Moves fingers.  Data Review: I have personally reviewed the following laboratory data and studies,  CBC: Recent Labs  Lab 02/22/24 0030 02/22/24 1428 02/23/24 0647  WBC 7.2 10.6* 10.0  NEUTROABS 5.5  --   --   HGB 10.9* 11.0* 10.5*  HCT 32.4* 32.6* 31.3*  MCV 97.3 97.3 97.2  PLT 254 272 267   Basic Metabolic Panel: Recent Labs  Lab 02/22/24 0030 02/22/24 1428 02/23/24 0647  NA 131*  --  136  K 3.5  --  4.0  CL 100  --  106  CO2 22  --  23  GLUCOSE 113*  --  123*  BUN 9  --  13  CREATININE 0.69 0.86 0.80  CALCIUM 8.7*  --  9.4   Liver Function Tests: Recent Labs  Lab 02/22/24 0030  AST 26  ALT 12  ALKPHOS 62  BILITOT 0.3  PROT 7.9  ALBUMIN 3.3*   No results for input(s): "LIPASE", "AMYLASE" in the last 168 hours. No results for input(s): "AMMONIA" in the last 168 hours. Cardiac Enzymes: No results for input(s): "CKTOTAL", "CKMB", "CKMBINDEX", "TROPONINI" in the last 168 hours. BNP (last 3 results) No results for input(s): "BNP" in the last 8760 hours.  ProBNP (last 3 results) No results for input(s): "PROBNP" in the last 8760 hours.  CBG: No results for input(s): "GLUCAP" in the last 168 hours. Recent Results (from the past 240 hours)  Blood Culture (routine x 2)     Status: None (Preliminary result)   Collection Time: 02/21/24 11:57 PM   Specimen: BLOOD  Result Value Ref Range Status   Specimen Description  BLOOD BLOOD RIGHT FOREARM  Final   Special Requests   Final    BOTTLES DRAWN AEROBIC AND ANAEROBIC Blood Culture results may not be optimal due to an inadequate volume of blood received in culture bottles   Culture   Final    NO GROWTH 1 DAY Performed at [facility], [address], [place], [place] [address]    Report Status PENDING  Incomplete  Blood Culture (routine x 2)     Status: None (Preliminary result)   Collection Time: 02/22/24  1:03 AM   Specimen: BLOOD LEFT FOREARM  Result Value  Ref Range Status   Specimen Description BLOOD LEFT FOREARM  Final   Special Requests   Final    BOTTLES DRAWN AEROBIC AND ANAEROBIC Blood Culture results may not be optimal due to an inadequate volume of blood received in culture bottles   Culture   Final    NO GROWTH 1 DAY Performed at [name], [name], [place], [place] [address]    Report Status PENDING  Incomplete  Aerobic/Anaerobic Culture w Gram Stain (surgical/deep wound)     Status: None (Preliminary result)   Collection Time: 02/22/24 12:12 PM   Specimen: Wound; Abscess  Result Value Ref Range Status   Specimen Description ABSCESS  Final   Special Requests right hand  Final   Gram Stain   Final    RARE WBC SEEN MODERATE GRAM POSITIVE COCCI IN PAIRS    Culture   Final    MODERATE STAPHYLOCOCCUS AUREUS SUSCEPTIBILITIES TO FOLLOW Performed at [facility], [address], [place], [place] [address]    Report Status PENDING  Incomplete     Studies: DG Hand Complete Right Result Date: 02/22/2024 CLINICAL DATA:  Infection EXAM: RIGHT HAND - COMPLETE 3+ VIEW COMPARISON:  None Available. FINDINGS: There is diffuse soft tissue swelling of the hand. There is no foreign body identified. A ring is seen on the fourth finger. There is no evidence of fracture or dislocation. There is no evidence of arthropathy or other focal bone abnormality. IMPRESSION: Diffuse soft tissue swelling of the hand. No foreign body  identified. Electronically Signed   By: Darliss Cheney M.D.   On: 02/22/2024 00:36      Joycelyn Das, MD  [facility] 02/23/2024  If 7PM-7AM, please contact night-coverage

## 2024-02-23 NOTE — Discharge Summary (Signed)
 Physician Discharge Summary  Mark Galloway RUE:[id number] DOB: 1986/11/28 DOA: 02/21/2024  PCP: Pcp, No  Admit date: 02/21/2024 Discharge date: 02/24/2024  Admitted From: Home  Discharge disposition: Home   Recommendations for Outpatient Follow-Up:   Follow up with your primary care provider in one week.  Check CBC, BMP, magnesium in the next visit Follow-up with orthopedic surgery in 1 to 2 days.   Discharge Diagnosis:   Principal Problem:   Cellulitis of right hand Active Problems:   Cellulitis   Discharge Condition: Improved.  Diet recommendation: .  Regular.  Wound care: None.  Code status: Full.   History of Present Illness:   Mark Galloway is a 38 y.o. male with past medical history of HIV on HAART presented to hospital with right hand pain swelling and blistering on the morning of 02/21/2024 without any fever.  Patient wonder whether there was an insect bite and denied using IV drugs.  In the ED patient was noted to be tachycardic with fever of 100.2 F and there was some concern over abscess of the right little finger.  Patient was then emergently taken to the OR by orthopedic surgery.  Patient was noted to have right small finger flexor sheath infection and abscess at the ring and small finger.  I&D was done on 02/22/2024 and patient was admitted to hospital for further evaluation and treatment.    Hospital Course:   Following conditions were addressed during hospitalization as listed below,  Right small finger/hand abscess status post I&D.  Gross purulence were noted.  Await cultures.  Received vancomycin and cefepime in the ED.  Has been continued on vancomycin.  Blood cultures negative in 1 day.  Aerobic anaerobic culture from the surgical wound showed moderate Staph aureus.  Sensitivity pending.  Orthopedic as recommended IV antibiotic.  Plan for hydrotherapy in 2 to 4 days.  No leukocytosis at this time.  Temperature max as 100.2 F in the last 24 hours but  latest temperature of 98.2 F..  Communicated with orthopedics who stated that patient can be discharged home and will be seen in the clinic in 1 to 2 days.  Patient will be discharged home on oral bactrim DS for now.   HIV.  Continue with highly active antiretroviral treatment.    Disposition.  At this time, patient is stable for disposition home with outpatient PCP and hand surgery follow-up.  Medical Consultants:   Orthopedics  Procedures:    Incision and drainage of right hand on 02/22/2024  Subjective:   Today, patient was seen and examined at bedside.Complains of mild finger discomfort. Still has some numbness to the finger. Denies any shortness of breath nausea vomiting abdominal pain.  Wants to go home.  Discharge Exam:   Vitals:   02/24/24 0447 02/24/24 0829  BP: (!) 114/53 110/78  Pulse: 71 74  Resp: 16 15  Temp: 98.4 F (36.9 C) 98.4 F (36.9 C)  SpO2: 100% 100%   Vitals:   02/23/24 1841 02/23/24 1856 02/24/24 0447 02/24/24 0829  BP: (!) 171/130 122/73 (!) 114/53 110/78  Pulse: (!) 141 70 71 74  Resp: 17 17 16 15   Temp: 98.5 F (36.9 C) 98.6 F (37 C) 98.4 F (36.9 C) 98.4 F (36.9 C)  TempSrc: Oral Oral Oral Oral  SpO2: 96% 99% 100% 100%  Weight:      Height:        GENERAL: Patient is alert awake and oriented. Not in obvious distress. HENT: No scleral pallor or  icterus. Pupils equally reactive to light. Oral mucosa is moist NECK: is supple, no gross swelling noted. CHEST: Clear to auscultation. No crackles or wheezes.  CVS: S1 and S2 heard, no murmur. Regular rate and rhythm.  ABDOMEN: Soft, non-tender, bowel sounds are present. EXTREMITIES: No edema. CNS: Cranial nerves are intact. No focal motor deficits. SKIN: warm and dry, right hand covered with clean bandage.  Moves fingers.  The results of significant diagnostics from this hospitalization (including imaging, microbiology, ancillary and laboratory) are listed below for reference.      Diagnostic Studies:   DG Hand Complete Right Result Date: 02/22/2024 CLINICAL DATA:  Infection EXAM: RIGHT HAND - COMPLETE 3+ VIEW COMPARISON:  None Available. FINDINGS: There is diffuse soft tissue swelling of the hand. There is no foreign body identified. A ring is seen on the fourth finger. There is no evidence of fracture or dislocation. There is no evidence of arthropathy or other focal bone abnormality. IMPRESSION: Diffuse soft tissue swelling of the hand. No foreign body identified. Electronically Signed   By: Darliss Cheney M.D.   On: 02/22/2024 00:36     Labs:   Basic Metabolic Panel: Recent Labs  Lab 02/22/24 0030 02/22/24 1428 02/23/24 0647 02/24/24 0656  NA 131*  --  136 137  K 3.5  --  4.0 3.7  CL 100  --  106 106  CO2 22  --  23 24  GLUCOSE 113*  --  123* 102*  BUN 9  --  13 6  CREATININE 0.69 0.86 0.80 0.69  CALCIUM 8.7*  --  9.4 8.9  MG  --   --   --  1.6*   GFR Estimated Creatinine Clearance: 126.4 mL/min (by C-G formula based on SCr of 0.69 mg/dL). Liver Function Tests: Recent Labs  Lab 02/22/24 0030  AST 26  ALT 12  ALKPHOS 62  BILITOT 0.3  PROT 7.9  ALBUMIN 3.3*   No results for input(s): "LIPASE", "AMYLASE" in the last 168 hours. No results for input(s): "AMMONIA" in the last 168 hours. Coagulation profile Recent Labs  Lab 02/22/24 0030 02/23/24 0647  INR 1.1 1.1    CBC: Recent Labs  Lab 02/22/24 0030 02/22/24 1428 02/23/24 0647 02/24/24 0656  WBC 7.2 10.6* 10.0 4.8  NEUTROABS 5.5  --   --   --   HGB 10.9* 11.0* 10.5* 10.6*  HCT 32.4* 32.6* 31.3* 31.9*  MCV 97.3 97.3 97.2 98.5  PLT 254 272 267 257   Cardiac Enzymes: No results for input(s): "CKTOTAL", "CKMB", "CKMBINDEX", "TROPONINI" in the last 168 hours. BNP: Invalid input(s): "POCBNP" CBG: No results for input(s): "GLUCAP" in the last 168 hours. D-Dimer No results for input(s): "DDIMER" in the last 72 hours. Hgb A1c No results for input(s): "HGBA1C" in the last 72  hours. Lipid Profile No results for input(s): "CHOL", "HDL", "LDLCALC", "TRIG", "CHOLHDL", "LDLDIRECT" in the last 72 hours. Thyroid function studies No results for input(s): "TSH", "T4TOTAL", "T3FREE", "THYROIDAB" in the last 72 hours.  Invalid input(s): "FREET3" Anemia work up No results for input(s): "VITAMINB12", "FOLATE", "FERRITIN", "TIBC", "IRON", "RETICCTPCT" in the last 72 hours. Microbiology Recent Results (from the past 240 hours)  Blood Culture (routine x 2)     Status: None (Preliminary result)   Collection Time: 02/21/24 11:57 PM   Specimen: BLOOD RIGHT FOREARM  Result Value Ref Range Status   Specimen Description BLOOD RIGHT FOREARM  Final   Special Requests   Final    BOTTLES DRAWN AEROBIC AND  ANAEROBIC Blood Culture results may not be optimal due to an inadequate volume of blood received in culture bottles   Culture   Final    NO GROWTH 2 DAYS Performed at [facility], [address] [place], [place] [address]    Report Status PENDING  Incomplete  Blood Culture (routine x 2)     Status: None (Preliminary result)   Collection Time: 02/22/24  1:03 AM   Specimen: BLOOD LEFT FOREARM  Result Value Ref Range Status   Specimen Description BLOOD LEFT FOREARM  Final   Special Requests   Final    BOTTLES DRAWN AEROBIC AND ANAEROBIC Blood Culture results may not be optimal due to an inadequate volume of blood received in culture bottles   Culture   Final    NO GROWTH 2 DAYS Performed at [facility], [address] [place], [place] [address]    Report Status PENDING  Incomplete  Aerobic/Anaerobic Culture w Gram Stain (surgical/deep wound)     Status: None (Preliminary result)   Collection Time: 02/22/24 12:12 PM   Specimen: Wound; Abscess  Result Value Ref Range Status   Specimen Description ABSCESS  Final   Special Requests right hand  Final   Gram Stain   Final    RARE WBC SEEN MODERATE GRAM POSITIVE COCCI IN PAIRS Performed at [facility], [address], [place], [place] [address]    Culture   Final    MODERATE METHICILLIN RESISTANT STAPHYLOCOCCUS AUREUS NO ANAEROBES ISOLATED; CULTURE IN PROGRESS FOR 5 DAYS    Report Status PENDING  Incomplete   Organism ID, Bacteria METHICILLIN RESISTANT STAPHYLOCOCCUS AUREUS  Final      Susceptibility   Methicillin resistant staphylococcus aureus - MIC*    CIPROFLOXACIN <=0.5 SENSITIVE Sensitive     ERYTHROMYCIN >=8 RESISTANT Resistant     GENTAMICIN <=0.5 SENSITIVE Sensitive     OXACILLIN >=4 RESISTANT Resistant     TETRACYCLINE >=16 RESISTANT Resistant     VANCOMYCIN 1 SENSITIVE Sensitive     TRIMETH/SULFA <=10 SENSITIVE Sensitive     CLINDAMYCIN <=0.25 SENSITIVE Sensitive     RIFAMPIN <=0.5 SENSITIVE Sensitive     Inducible Clindamycin NEGATIVE Sensitive     LINEZOLID 2 SENSITIVE Sensitive     * MODERATE METHICILLIN RESISTANT STAPHYLOCOCCUS AUREUS     Discharge Instructions:   Discharge Instructions     Call MD for:  persistant nausea and vomiting   Complete by: As directed    Call MD for:  redness, tenderness, or signs of infection (pain, swelling, redness, odor or green/yellow discharge around incision site)   Complete by: As directed    Call MD for:  severe uncontrolled pain   Complete by: As directed    Call MD for:  temperature >100.4   Complete by: As directed    Diet general   Complete by: As directed    Discharge instructions   Complete by: As directed    Follow up with your primary care provider in one week.  Follow-up with hand surgery as outpatient as scheduled by the clinic in 1 to 2 days.  Continue antibiotics as prescribed.  Seek medical attention for worsening symptoms.   Increase activity slowly   Complete by: As directed       Allergies as of 02/24/2024       Reactions   Peanut Oil Hives   Peanut-containing Drug Products         Medication List  STOP taking these medications    acetaminophen 500 MG tablet Commonly known as:  TYLENOL       TAKE these medications    ascorbic acid 1000 MG tablet Commonly known as: VITAMIN C Take 1 tablet (1,000 mg total) by mouth daily.   Biktarvy 50-200-25 MG Tabs tablet Generic drug: bictegravir-emtricitabine-tenofovir AF Take 1 tablet by mouth daily.   docusate sodium 100 MG capsule Commonly known as: COLACE Take 1 capsule (100 mg total) by mouth 2 (two) times daily.   FT Ibuprofen 200 MG tablet Generic drug: ibuprofen Take 4 tablets (800 mg total) by mouth every 6 (six) hours as needed for mild pain (pain score 1-3) or moderate pain (pain score 4-6).   HYDROcodone-acetaminophen 7.5-325 MG tablet Commonly known as: NORCO Take 1 tablet by mouth every 6 (six) hours as needed for up to 5 days for severe pain (pain score 7-10).   Icy Hot Extra Strength 10-30 % Crea Apply 1 Application topically as needed (muscle pain).   naphazoline-glycerin 0.012-0.25 % Soln Commonly known as: CLEAR EYES REDNESS Place 1-2 drops into both eyes 4 (four) times daily as needed for eye irritation.   nystatin 100000 UNIT/ML suspension Commonly known as: MYCOSTATIN Take 5 mLs (500,000 Units total) by mouth 4 (four) times daily.   sulfamethoxazole-trimethoprim 800-160 MG tablet Commonly known as: Bactrim DS Take 1 tablet by mouth 2 (two) times daily.        Follow-up Information     Betha Loa, MD. Call.   Specialty: Orthopedic Surgery Contact information: [address] [place] [address] [phone]                  Time coordinating discharge: 39 minutes  Signed:  Vanshika Jastrzebski  [facility] 02/24/2024, 12:56 PM

## 2024-02-24 ENCOUNTER — Ambulatory Visit (HOSPITAL_COMMUNITY)
Admission: EM | Admit: 2024-02-24 | Discharge: 2024-02-24 | Disposition: A | Discharge status: Home / Self Care | Payer: No Payment, Other | Attending: Psychiatry | Admitting: Psychiatry

## 2024-02-24 ENCOUNTER — Other Ambulatory Visit (HOSPITAL_COMMUNITY): Payer: Self-pay

## 2024-02-24 ENCOUNTER — Telehealth (HOSPITAL_COMMUNITY): Payer: Self-pay | Admitting: Pharmacy Technician

## 2024-02-24 ENCOUNTER — Encounter (HOSPITAL_COMMUNITY): Payer: Self-pay | Admitting: Psychiatry

## 2024-02-24 ENCOUNTER — Encounter (HOSPITAL_COMMUNITY): Payer: Self-pay | Admitting: Orthopedic Surgery

## 2024-02-24 DIAGNOSIS — L03113 Cellulitis of right upper limb: Principal | ICD-10-CM

## 2024-02-24 DIAGNOSIS — F319 Bipolar disorder, unspecified: Secondary | ICD-10-CM | POA: Insufficient documentation

## 2024-02-24 DIAGNOSIS — Z789 Other specified health status: Secondary | ICD-10-CM

## 2024-02-24 DIAGNOSIS — Z91199 Patient's noncompliance with other medical treatment and regimen due to unspecified reason: Secondary | ICD-10-CM

## 2024-02-24 DIAGNOSIS — F4322 Adjustment disorder with anxiety: Secondary | ICD-10-CM

## 2024-02-24 DIAGNOSIS — Z5900 Homelessness unspecified: Secondary | ICD-10-CM

## 2024-02-24 LAB — CBC
HCT: 31.9 % — ABNORMAL LOW (ref 39.0–52.0)
Hemoglobin: 10.6 g/dL — ABNORMAL LOW (ref 13.0–17.0)
MCH: 32.7 pg (ref 26.0–34.0)
MCHC: 33.2 g/dL (ref 30.0–36.0)
MCV: 98.5 fL (ref 80.0–100.0)
Platelets: 257 10*3/uL (ref 150–400)
RBC: 3.24 MIL/uL — ABNORMAL LOW (ref 4.22–5.81)
RDW: 15.5 % (ref 11.5–15.5)
WBC: 4.8 10*3/uL (ref 4.0–10.5)
nRBC: 0 % (ref 0.0–0.2)

## 2024-02-24 LAB — BASIC METABOLIC PANEL
Anion gap: 7 (ref 5–15)
BUN: 6 mg/dL (ref 6–20)
CO2: 24 mmol/L (ref 22–32)
Calcium: 8.9 mg/dL (ref 8.9–10.3)
Chloride: 106 mmol/L (ref 98–111)
Creatinine, Ser: 0.69 mg/dL (ref 0.61–1.24)
GFR, Estimated: >60 mL/min (ref >60)
Glucose, Bld: 102 mg/dL — ABNORMAL HIGH (ref 70–99)
Potassium: 3.7 mmol/L (ref 3.5–5.1)
Sodium: 137 mmol/L (ref 135–145)

## 2024-02-24 LAB — MAGNESIUM: Magnesium: 1.6 mg/dL — ABNORMAL LOW (ref 1.7–2.4)

## 2024-02-24 MED ORDER — IBUPROFEN 200 MG PO TABS
800.0000 mg | ORAL_TABLET | Freq: Four times a day (QID) | ORAL | 0 refills | Status: DC | PRN
  Filled 2024-02-24: qty 30, 2d supply, fill #0

## 2024-02-24 MED ORDER — SULFAMETHOXAZOLE-TRIMETHOPRIM 800-160 MG PO TABS
1.0000 | ORAL_TABLET | Freq: Two times a day (BID) | ORAL | 0 refills | Status: DC
  Filled 2024-02-24: qty 10, 5d supply, fill #0

## 2024-02-24 MED ORDER — NYSTATIN 100000 UNIT/ML MT SUSP
5.0000 mL | Freq: Four times a day (QID) | OROMUCOSAL | 0 refills | Status: DC
  Filled 2024-02-24: qty 60, 3d supply, fill #0

## 2024-02-24 MED ORDER — HYDROCODONE-ACETAMINOPHEN 7.5-325 MG PO TABS
1.0000 | ORAL_TABLET | Freq: Four times a day (QID) | ORAL | 0 refills | Status: AC | PRN
  Filled 2024-02-24: qty 15, 4d supply, fill #0

## 2024-02-24 MED ORDER — DOCUSATE SODIUM 100 MG PO CAPS
100.0000 mg | ORAL_CAPSULE | Freq: Two times a day (BID) | ORAL | 0 refills | Status: DC
  Filled 2024-02-24: qty 10, 5d supply, fill #0

## 2024-02-24 MED ORDER — MAGNESIUM OXIDE -MG SUPPLEMENT 400 (240 MG) MG PO TABS
400.0000 mg | ORAL_TABLET | Freq: Two times a day (BID) | ORAL | Status: DC
Start: 2024-02-24 — End: 2024-02-24
  Administered 2024-02-24: 400 mg via ORAL
  Filled 2024-02-24: qty 1

## 2024-02-24 MED ORDER — ASCORBIC ACID 1000 MG PO TABS: 1000.0000 mg | ORAL_TABLET | Freq: Every day | ORAL | Status: DC

## 2024-02-24 MED ORDER — BIKTARVY 50-200-25 MG PO TABS
1.0000 | ORAL_TABLET | Freq: Every day | ORAL | 0 refills | Status: DC
  Filled 2024-02-24: qty 30, 30d supply, fill #0

## 2024-02-24 NOTE — Discharge Instructions (Addendum)
 Please call [place] housing network at [phone] once you have obtained your first of getting Social Security card.   Please call coordinated entry at [phone] housing voucher   Substance Abuse Treatment Programs  Intensive Outpatient Programs [facility]     [address]      [place], [place]                   [phone]       [facility] [address] [place], [place] [phone]  [facility]     (Inpatient and outpatient)     [address]           [phone]    [facility] [phone] (Suboxone and Methadone)  [address]      [place], [place] [address]      [phone]       [address] [place], [place] [phone]  [facility] (Outpatient/Inpatient, [employer])    (insurance only) [phone]             [facility] (Groups & Residential) [place], [place] [phone]     [facility]     [address]     [place], [place]      [phone]       [facility] (for caregivers and family) [phone] [address] Laurell Joseph[address]. [address] [place], [place] [phone]      Residential Treatment Programs [facility]      [address] [place], [place] [address]  [phone]       [facility] [address], [place], [place] [address] [phone]  [facility] [place]        [phone]       [facility] [phone]  [facility] ([facility]             [address]                                         [place], [place]                                                [phone] or [phone]                               [facility] [address] [place], [address] [phone]  [facility]    [address]      [place], [place]     [phone]       [facility] [address] [place], [place] [phone]  [facility]  Treatment Facility   [address] [place], [place] [address]     [phone]      Admissions: 8am-3pm M-F  [facility] ([facility]) [address] [place], [place] [phone]  [facility]: Residential Program [phone] Days)   [place], [place]      [phone] or [phone]     [facility]: [facility] [place], [place] (Walk in Hours over the weekend or by referral)  [facility] [address] [place], [place], [place] [address] [phone]  Crisis Mobile: [facility]:  9347471048 (for crisis response 24 hours a day) [facility] Hotline:      9132907398 Outpatient Psychiatry and Counseling  [facility]: Mobile Crisis Management 24 hours:  [phone]  [facility] [employer] sliding scale fee and walk in schedule: M-F 8am-12pm/1pm-3pm [address]  [place], [place] [address] [phone]  [facility] [address] [place], [place] [address] [phone]  [facility] ([facility] [employer])- new patient walk-in appointments available Monday - Friday 8am -3pm.          [address] [place], [place] [address] [phone] or crisis line- [phone]  [facility]/ Intensive Outpatient Therapy Program [address] [place], [place] [address] [phone]  [facility]                  Crisis Services      [phone] [address]     [place], [place] [address]                 [facility]   [facility] [phone]. [address] [place], [place] [address]   [employer] [facility]          [address] [place]  [place], [place] [address]       [phone]  [facility] [address] [place], [place] [address] [phone]  [facility]    [address]    [place],  [place] [address]     [phone]       Andee Poles, MD     [address]     [place] [address]     [phone]       [facility] [address] [place] [address]  [facility]     [address] [place], [place]      [phone]       [facility] Eulogio Ditch, MD [address] [place], [place] [address] [phone]  [facility]     [address]     [place], [place] [address]     [phone]       [facility] [address] [place], [place] [address] [phone] Resources for Temporary Residential Assistance/Crisis Centers  [facility] CENT[facility]) M-F 8am-3pm   [address] [place], [place] [address]   [phone] Services include: laundry, barbering, support groups, case management, phone  & computer access, showers, AA/NA mtgs, mental health/substance abuse nurse, job skills class, disability information, VA assistance, spiritual classes, etc.   HOMELESS SHELTERS  [facility]     [name]   [address] [place]     [phone]              [employer] (women and children)       [address] [place], [place] [address] [phone] [email or web address] for application and process Application Required  [facility] [employer] [facility]   [address]    [place] [address]     [phone]                    [facility] [address] [place], [place] [address] [phone] application appt.)  Application Required  [employer] (women only)    [address]     [place], [place] [address]     [phone]      Intake starts 6pm daily Need valid ID, SSC, & Police report [employer] [address] [place], [place] [phone] Application Required  [employer] (men only)     [address]      [place], [place]     [phone]       [facility] (Pregnant women only) [address] [place], [place] [phone]  [facility]      [address]      [place], [place] [address]     [phone]             [facility] [address] [place], [place] [phone] 90 day commitment/SA/Application process  [facility](men only)     [address]     [place], [place]     [phone]       Check-in at [facility] [address] [place], [place] [address] [phone] Men/Women/Women and Children must be there by 7 pm  [facility] [place], [place] [phone]

## 2024-02-24 NOTE — Progress Notes (Signed)
   02/24/24 1251  [facility] Triage Screening (Walk-ins at [facility] only)  How Did You Hear About [place]? Family/Friend  What Is the Reason for Your Visit/Call Today? Pt reports depression and anxiety for the past couple of years. Pt reports that him and his mother loss their home on Saturday and he is now homeless. Pt states that he is looking for somewhere to stay so he can take care of himself until his disability hearing on Wednesday. Pt has a diagnosis of bipolar disorder. Pt does not have outpatient services however he has received his medications from the ED. Pt [facility] an hour ago due to having surgery on his hand. Pt denies SI, HI, AVH. Pt mom present during triage and reports that she is worried about pt mental health and him walking the streets. Pt is calm, engaging, does not appear manic,  psychotic or responding to internal stimuli.  How Long Has This Been Causing You Problems? > than 6 months  Have You Recently Had Any Thoughts About Hurting Yourself? No  Are You Planning to Commit Suicide/Harm Yourself At This time? No  Have you Recently Had Thoughts About Hurting Someone [place]? No  Are You Planning To Harm Someone At This Time? No  Physical Abuse Denies  Verbal Abuse Denies  Sexual Abuse Denies  Exploitation of patient/patient's resources Denies  Self-Neglect Denies  Are you currently experiencing any auditory, visual or other hallucinations? No  Have You Used Any Alcohol or Drugs in the Past 24 Hours? No  Do you have any current medical co-morbidities that require immediate attention? No  Clinician description of patient physical appearance/behavior: calm  What Do You Feel Would Help You the Most Today? Housing Assistance  If access to [facility] was not available, would you have sought care in the Emergency Department? No  Determination of Need Routine (7 days)  Options For Referral Outpatient Therapy;Medication Management;Facility-Based Crisis

## 2024-02-24 NOTE — Consult Note (Signed)
 [facility] Psychiatric Consult Initial  Patient Name: .Mark Galloway  MRN: [id number]  DOB: [birth year]  Consult Order details:  Orders (From admission, onward)     Start     Ordered   02/23/24 1952  IP CONSULT TO PSYCHIATRY       Ordering Provider: Luiz Iron, NP  Provider:  Sarita Bottom, MD  Question Answer Comment  Location [facility]   Reason for Consult? Bipolar, Schizophrenia  w concern over homelessness and neeeding continued medical treatment      02/23/24 1953             Mode of Visit: In person    Psychiatry Consult Evaluation  Service Date: February 24, 2024 LOS:  LOS: 2 days  Chief Complaint Bipolar, Schizophrenia w concern over homelessness and needing continued medical treatment / Garner Nash   Primary Psychiatric Diagnoses  History of bipolar disorder, suspect bipolar II   Assessment  Mark Galloway is a 38 y.o. male admitted: Medicallyfor 02/21/2024 11:39 PM for R small finger/hand abscess s/p I&D. He carries the psychiatric diagnoses of bipolar I disorder with psychotic behavior and has a past medical history of HIV.   His current presentation of euthymic mood is most consistent with history of bipolar disorder, suspected bipolar II. He meets criteria for history of bipolar II based on report of increased energy, talkativeness impulsivity that is then followed by period of depression. Current outpatient psychotropic medications have historically included depakote, lithium, risperdal, seroquel and historically he has had a good response to these medications. He was non compliant with medications prior to admission as evidenced by patient report, fill history. On initial examination, patient does not appear to be acutely manic or psychotic. He is pleasant. He appears to understand the care needed for his hand at home and has the capacity to make medical decision regarding outpatient care for his R hand abscess. He is open to restarting  medications but is also forthcoming that he will likely not continue in the outpatient setting. For the mental health crisis referred to by his mother, he reports that this is due to him not having a place to stay after discharge. Patient was discharged prior to contact with collateral and ability to place outpatient resources in AVS. Appears that patient then showed up at the [facility] this afternoon. Please see plan below for detailed recommendations.   Diagnoses:  Active Hospital problems: Principal Problem:   Cellulitis of right hand Active Problems:   Cellulitis    Plan   ## Psychiatric Medication Recommendations:  -- None, would recommend following up outpatient for medication management  ## Medical Decision Making Capacity:     In an evaluation of capacity, each of the following criteria must be met based on medical necessity in order for a patient to have capacity to make the decision in question. Of note, the capacity evaluation assesses only for the specified decision documented above and is not a determination of the patient's overall competency, which can only be adjudicated.  Criterion 1: The patient demonstrates a clear and consistent voluntary choice with regard to treatment options. Patient reports that he is aware that he will be discharged and he understands the care for his hand at home including taking antibiotics, keeping his hand elevated.  Criterion 2: The patient adequately understands the disease they have, the treatment proposed, the risks of treatment, and the risks of other treatment (including no treatment). Patient understands that he had an infection of his hand.  He understands that if he doesn't take his antibiotics or keep his hand elevated that he is at risk of losing his hand.   Criterion 3: The patient acknowledges that the details of Criterion 2 apply to them specifically and the likely consequences of treatment options proposed. He acknowledges that he is at  risk of losing his hand if he does not follow-up with outpatient care.   Criterion 4: The patient demonstrates adequate reasoning/rationality within the context of their decision and can provide justification for their choice. Patient is able to understand that he needs to continue to care for his hand or he is at risk of amputation. He expresses that he will be able to care for his hand in the outpatient setting.  In this case, the patient has capacity to decide to pursue care in the outpatient setting for his hand infection.   ## Further Work-up:  -- per primary  -- most recent EKG on 2/22 had QtC of 414 -- Pertinent labwork reviewed earlier this admission includes: CBC, BMP, Mg, Bcx, wound cx   ## Disposition:-- There are no psychiatric contraindications to discharge at this time  ## Behavioral / Environmental: - No specific recommendations at this time.     ## Safety and Observation Level:  - Based on my clinical evaluation, I estimate the patient to be at low risk of self harm in the current setting. - At this time, we recommend  routine. This decision is based on my review of the chart including patient's history and current presentation, interview of the patient, mental status examination, and consideration of suicide risk including evaluating suicidal ideation, plan, intent, suicidal or self-harm behaviors, risk factors, and protective factors. This judgment is based on our ability to directly address suicide risk, implement suicide prevention strategies, and develop a safety plan while the patient is in the clinical setting. Please contact our team if there is a concern that risk level has changed.  CSSR Risk Category:C-SSRS RISK CATEGORY: No Risk  Suicide Risk Assessment: Patient has following modifiable risk factors for suicide: medication noncompliance, which we planned to address by placing outpatient resources however patient discharged prior to this being able to be done.  Patient then presented to outpatient setting.  Patient has following non-modifiable or demographic risk factors for suicide: male gender and psychiatric hospitalization Patient has the following protective factors against suicide: Supportive family and no history of suicide attempts  Thank you for this consult request. Recommendations have been communicated to the primary team.  We will sign off at this time.   Karie Fetch, MD, PGY-2       History of Present Illness  Relevant Aspects of [facility] Course:  Admitted on 02/21/2024 for R hand abscess s/p I&D.  -started on vancomycin  Patient Report:  Patient reports he is currently feeling somewhat sad due to the situation with his hand. He otherwise denies any mood symptoms prior to admission. He reports a history of bipolar disorder, reports has manic episodes even without substances which he describes as super excited, impulsive, talking faster. Reports this lasts for 2 weeks and then he becomes depressed. Reports the last time this happened was around a month ago. Reports not taking any psychotropic medications but has been on seroquel, lithium, risperdal, depakote in the past. Reports currently has an outpatient psychiatrist on [address] whom he saw maybe 4 months ago. He reports that his mom thinks he is in a mental health crisis because he doesn't have a place  to go. Reports his mom is looking for someplace for him to stay but can take up to 2 weeks. Denies history of suicide attempt, reports 1 past psychiatric hospitalization. Reports he was living with mom but then they got kicked out of the place they were staying last Saturday. Reports he was then staying with friends. Denies current SI/HI/AVH. Denies access to guns. He also denies substance use.   Psych ROS:  Depression: reports feeling somewhat sad due to situation with hand Anxiety:  denies Mania (lifetime and current): super excited, impulsive, talking faster. Reports this  lasts for 2 weeks and then he becomes depressed. Reports the last time this happened was around a month ago.  Psychosis: (lifetime and current): denies  Collateral information:  Was discharged prior to contact with collateral  Review of Systems  Constitutional:  Negative for fever and malaise/fatigue.  Psychiatric/Behavioral:  Negative for suicidal ideas.      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 Prior UDS wnl. Suspicion for IVDU etiology for his abscess but patient denied.  Exam Findings  Physical Exam:  Vital Signs:  Temp:  [98.4 F (36.9 C)-98.7 F (37.1 C)] 98.4 F (36.9 C) (02/24 0829) Pulse Rate:  [70-141] 74 (02/24 0829) Resp:  [15-17] 15 (02/24 0829) BP: (110-171)/(53-130) 110/78 (02/24 0829) SpO2:  [96 %-100 %] 100 % (02/24 0829) Blood pressure 110/78, pulse 74, temperature 98.4 F (36.9 C), temperature source Oral, resp. rate 15, height 5\' 9"  (1.753 m), weight 74.8 kg, SpO2 100%. Body mass index is 24.35 kg/m.  Physical Exam Constitutional:      Appearance: Normal appearance.  HENT:     Head: Normocephalic and atraumatic.  Pulmonary:     Effort: Pulmonary effort is normal.  Musculoskeletal:     Comments: R hand wrapped in bandage   Neurological:     General: No focal deficit present.     Mental Status: He is alert.  Psychiatric:        Mood and Affect: Mood and affect normal.        Behavior: Behavior is cooperative.        Thought Content: Thought content is not paranoid. Thought content does not include homicidal or suicidal ideation.     Mental Status Exam: General Appearance: Casual  Orientation:  Full (Time, Place, and Person)  Memory:  Immediate;   Fair  Concentration:  Concentration: Fair  Recall:  Fair  Attention  Fair  Eye Contact:  Good  Speech:  Clear and Coherent  Language:  Good  Volume:  Normal  Mood: "Don't have a place to go"  Affect:  Appropriate  Thought Process:  Coherent  Thought Content:  Logical  Suicidal Thoughts:  No  Homicidal Thoughts:  No  Judgement:  Fair  Insight:  Fair  Psychomotor Activity:  Normal  Akathisia:  No  Fund of Knowledge:  Fair  Assets:  Communication Skills Desire for Improvement Resilience Social Support  Cognition:  WNL  ADL's:  Intact  AIMS (if indicated):        Other History   These have been pulled in through the EMR, reviewed, and updated if appropriate.  Family History:  The patient's family history is not on file.  Medical History: Past Medical History:  Diagnosis Date   Bipolar 1 disorder ([name])    HIV infection ([name])    Schizophrenia ([name])    Shingles     Surgical History: Past Surgical History:  Procedure Laterality Date   IRRIGATION AND DEBRIDEMENT FOOT Left      Medications:   Current Facility-Administered Medications:    acetaminophen (TYLENOL) tablet 650 mg, 650 mg, Oral, Q6H PRN, 650 mg at 02/22/24 1532 **OR** acetaminophen (TYLENOL) suppository 650 mg, 650 mg, Rectal, Q6H PRN, Nolberto Hanlon, MD   ascorbic acid (VITAMIN C) tablet 1,000 mg, 1,000 mg, Oral, Daily, Nolberto Hanlon, MD, 1,000 mg at 02/24/24 0909   bictegravir-emtricitabine-tenofovir AF (BIKTARVY) 50-200-25 MG per tablet 1 tablet, 1 tablet, Oral, Daily,  Nolberto Hanlon, MD, 1 tablet at 02/24/24 1017   ceFAZolin (ANCEF) IVPB 2g/100 mL premix, 2 g, Intravenous, Q8H, [place], Lynita Lombard, RPH, Last Rate: 200 mL/hr at 02/24/24 1478, 2 g at 02/24/24 2956   docusate sodium (COLACE) capsule 100 mg, 100 mg, Oral, BID, Pokhrel, Laxman, MD, 100 mg at 02/24/24 0909   enoxaparin (LOVENOX) injection 40 mg, 40 mg, Subcutaneous, Q24H, Nolberto Hanlon, MD, 40 mg at 02/24/24 0910   HYDROcodone-acetaminophen (NORCO) 7.5-325 MG per tablet 1-2 tablet, 1-2 tablet, Oral, Q4H PRN, Nolberto Hanlon, MD   HYDROcodone-acetaminophen (NORCO/VICODIN) 5-325 MG per tablet 1-2 tablet, 1-2 tablet, Oral, Q4H PRN, Nolberto Hanlon, MD   HYDROmorphone (DILAUDID) injection 0.5 mg, 0.5 mg, Intravenous, Q3H PRN, Nolberto Hanlon, MD, 0.5 mg at 02/22/24 2130   magnesium oxide (MAG-OX) tablet 400 mg, 400 mg, Oral, BID, Pokhrel, Laxman, MD, 400 mg at 02/24/24 8657   morphine (PF) 2 MG/ML injection 0.5-1 mg, 0.5-1 mg, Intravenous, Q2H PRN, Nolberto Hanlon, MD   naproxen (NAPROSYN) tablet 250 mg, 250 mg, Oral, BID WC, Nolberto Hanlon, MD, 250 mg at 02/24/24 0820   nystatin (MYCOSTATIN) 100000 UNIT/ML suspension 500,000 Units, 5 mL, Oral, QID, Pokhrel, Laxman, MD, 500,000 Units at 02/24/24 0820   ondansetron (ZOFRAN) injection 4 mg, 4 mg, Intravenous, Q6H PRN, Nolberto Hanlon, MD, 4 mg at 02/22/24 0355   Oral care mouth rinse, 15 mL, Mouth Rinse, PRN, Pokhrel, Laxman, MD   polyethylene glycol (MIRALAX / GLYCOLAX) packet 17 g, 17 g, Oral, Daily PRN, Nolberto Hanlon, MD   senna-docusate (Senokot-S) tablet 2 tablet, 2 tablet, Oral, BID, Nolberto Hanlon, MD, 2 tablet at 02/23/24 2249   sodium chloride flush (NS) 0.9 % injection 3 mL, 3 mL, Intravenous, Q12H, Nolberto Hanlon, MD, 3 mL at 02/24/24 0823   vancomycin (VANCOREADY) IVPB 1750 mg/350 mL, 1,750 mg, Intravenous, Q12H, Nolberto Hanlon, MD, Last Rate: 175 mL/hr at 02/24/24 0131, 1,750 mg at 02/24/24 0131  Current Outpatient Medications:    Menthol-Methyl Salicylate (ICY HOT EXTRA STRENGTH)  10-30 % CREA, Apply 1 Application topically as needed (muscle pain)., Disp: , Rfl:    naphazoline-glycerin (CLEAR EYES REDNESS) 0.012-0.25 % SOLN, Place 1-2 drops into both eyes 4 (four) times daily as needed for eye irritation., Disp: , Rfl:    sulfamethoxazole-trimethoprim (BACTRIM DS) 800-160 MG tablet, Take 1 tablet by mouth 2 (two) times daily., Disp: 10 tablet, Rfl: 0   ascorbic  acid (VITAMIN C) 1000 MG tablet, Take 1 tablet (1,000 mg total) by mouth daily., Disp: , Rfl:    bictegravir-emtricitabine-tenofovir AF (BIKTARVY) 50-200-25 MG TABS tablet, Take 1 tablet by mouth daily., Disp: 30 tablet, Rfl: 0   docusate sodium (COLACE) 100 MG capsule, Take 1 capsule (100 mg total) by mouth 2 (two) times daily., Disp: 10 capsule, Rfl: 0   HYDROcodone-acetaminophen (NORCO) 7.5-325 MG tablet, Take 1 tablet by mouth every 6 (six) hours as needed for up to 5 days for severe pain (pain score 7-10)., Disp: 15 tablet, Rfl: 0   ibuprofen (ADVIL) 200 MG tablet, Take 4 tablets (800 mg total) by mouth every 6 (six) hours as needed for mild pain (pain score 1-3) or moderate pain (pain score 4-6)., Disp: 30 tablet, Rfl: 0   nystatin (MYCOSTATIN) 100000 UNIT/ML suspension, Take 5 mLs (500,000 Units total) by mouth 4 (four) times daily., Disp: 60 mL, Rfl: 0  Allergies: Allergies  Allergen Reactions   Peanut Oil Hives   Peanut-Containing Drug Products     Karie Fetch, MD

## 2024-02-24 NOTE — ED Provider Notes (Signed)
 Behavioral Health Urgent Care Medical Screening Exam  Patient Name: Mark Galloway MRN: [id number] Date of Evaluation: 02/24/24 Chief Complaint:  "I need a place to stay until my disability hearing on Wednesday" Diagnosis:  Final diagnoses:  Adjustment disorder with anxious mood  Homeless  Need for community resource    History of Present illness: Mark Galloway is a 38 y.o. male patient presented to [facility] as a walk in accompanied by his mother with complaints of "I need a place to stay until my disability hearing on Wednesday"  Mark Galloway, 38 y.o., male patient seen face to face by this provider, chart reviewed, and consulted with Dr. Viviano Simas on 02/24/24.  Per chart review has psychiatric history that includes bipolar 1 disorder with psychotic behavior.  He has no outpatient psychiatric resources in place.  He has taken psychiatric medications in the past but currently is not prescribed any.  He is unemployed.  He has a disability hearing next Wednesday.  Of note patient was medically admitted on 02/21/2024 and discharged today 02/02/2024 due to right small finger/hand abscess with I&D.  He was also seen by Dr. Karie Fetch MD PGY-2 before discharged and was psychiatrically cleared. (See chart for Dr. Standley Brooking note) States upon discharge he was given resources for [facility].  He presents today seeking assistance with housing.  On evaluation Mark Galloway reports he was living with his mother and this past Saturday they lost their home.  They are now homeless.  He is looking for somewhere to stay until he has a disability hearing next Wednesday.  He also states his mother feels that he should not be on the streets as he just had surgery.  He reports a long history of anxiety and depression, but is denying any depressive symptoms at this time.  He does endorse some anxiety related to the possibility of being homeless.  He has friends that he could possibly stay with but has not reached out.  In addition  his mother will be staying with one of her friends and states that offer was not extended to him.  During evaluation Mark Galloway is casually dressed and pleasant upon approach.  He appears to be in no acute distress.  He is alert/oriented x 4, cooperative, and attentive.  He has normal speech and behavior.  His right hand/arm is wrapped.  He understands his discharge instructions from [facility] and verbalizes his follow-up appointment next week.  He is denying any current pain.  He is responses were relevant and appropriate to assessment questions.  He denies suicidal/self-harm/homicidal ideation, auditory/visual hallucinations, psychosis, and paranoia.  Objectively there is no evidence of psychosis/mania or delusional thinking.  He conversed coherently, with goal directed thoughts, and no distractibility, or pre-occupation.  At this time Mark Galloway is educated and verbalizes understanding of mental health resources and other crisis services in the community. He is instructed to call 911 and present to the nearest emergency room should He experience any suicidal/homicidal ideation, auditory/visual/hallucinations, or detrimental worsening of his mental health condition.    Flowsheet Row ED from 02/24/2024 in [facility] ED to Hosp-Admission (Discharged) from 02/21/2024 in [place] [facility]  SURGICAL ED from 04/13/2023 in [facility] Emergency Department at [facility]  C-SSRS RISK CATEGORY No Risk No Risk No Risk       Psychiatric Specialty Exam  Presentation  General Appearance:Appropriate for Environment; Casual  Eye Contact:Good  Speech:Clear and Coherent; Normal Rate  Speech Volume:Normal  Handedness:Right   Mood and Affect  Mood:Anxious; Depressed  Affect:Congruent   Thought Process  Thought Processes:Coherent  Descriptions of Associations:Intact  Orientation:Full (Time, Place and Person)  Thought  Content:Logical  Diagnosis of Schizophrenia or Schizoaffective disorder in past: No data recorded  Hallucinations:None  Ideas of Reference:None  Suicidal Thoughts:No  Homicidal Thoughts:No   Sensorium  Memory:Immediate Good; Recent Good; Remote Good  Judgment:Good  Insight:Good   Executive Functions  Concentration:Good  Attention Span:Good  Recall:Good  Fund of Knowledge:Good  Language:Good   Psychomotor Activity  Psychomotor Activity:Normal   Assets  Assets:Communication Skills; Desire for Improvement; Leisure Time; Physical Health; Resilience   Sleep  Sleep:Good  Number of hours: No data recorded  Physical Exam: Physical Exam Constitutional:      Appearance: Normal appearance.  Eyes:     General:        Right eye: No discharge.        Left eye: No discharge.  Pulmonary:     Effort: Pulmonary effort is normal. No respiratory distress.  Musculoskeletal:        General: Normal range of motion.     Cervical back: Normal range of motion.     Comments: R arm/ hand wrapped, pt d/c from hospital today after I&D on finger   Skin:    Capillary Refill: Capillary refill takes more than 3 seconds.  Neurological:     Mental Status: He is alert and oriented to person, place, and time.  Psychiatric:        Attention and Perception: Attention and perception normal.        Mood and Affect: Affect normal. Mood is anxious and depressed.        Speech: Speech normal.        Behavior: Behavior normal. Behavior is cooperative.        Thought Content: Thought content normal.        Cognition and Memory: Cognition normal.        Judgment: Judgment normal.    Review of Systems  Constitutional:  Negative for chills and fever.  HENT:  Negative for hearing loss.   Respiratory:  Negative for cough and shortness of breath.   Cardiovascular:  Negative for chest pain.  Musculoskeletal:  Negative for back pain.       R arm/hand wrapped, denies any pain at this time    Neurological:  Negative for tremors.  Psychiatric/Behavioral:  Positive for depression. The patient is nervous/anxious.    Blood pressure 128/75, pulse 81, temperature 98.7 F (37.1 C), temperature source Oral, resp. rate 20, SpO2 99%. There is no height or weight on file to calculate BMI.  Musculoskeletal: Strength & Muscle Tone: within normal limits Gait & Station: normal Patient leans: N/A   [facility] MSE Discharge Disposition for Follow up and Recommendations: Based on my evaluation the patient does not appear to have an emergency medical condition and can be discharged with resources and follow up care in outpatient services for Medication Management and Individual Therapy  Discharge patient  Provided outpatient psychiatric resources for medication management and therapy  Provided resources for shelters/halfway houses.    Provide number for entry coordinator for housing voucher/hearing located on AVS   Ardis Hughs, NP 02/24/2024, 3:06 PM

## 2024-02-24 NOTE — Progress Notes (Signed)
 Patient seen and examined at bedside.  No interval changes reported.  Communicated with TOC regarding assistance with homelessness.  Prescribed medications to [facility] pharmacy.  Discharge was canceled yesterday at the concern of mother.  No other interval changes.  Please refer to discharge summary dictated on 02/23/2024.

## 2024-02-24 NOTE — Discharge Summary (Signed)
 [name] to be D/C'd Home per NP order. An After Visit Summary was printed and given to the patient by provider. Patient escorted out and D/C home via private auto.  Dickie La  02/24/2024 3:09 PM

## 2024-02-24 NOTE — Progress Notes (Addendum)
 Pt stated AVS has been reviewed with patient; TOC meds need to be picked up and patient taken to discharge lounge to wait for TOC meds.

## 2024-02-24 NOTE — Progress Notes (Signed)
 Pt started on vancomycin. During the shift Pt pulled out hi\s IV. IV was placed a second time in the right hand 20 G . Medication restarted. Returned to room Iv placed was pulled out left IV out. Left medication unhooked reported to dayshift nurse.

## 2024-02-24 NOTE — Telephone Encounter (Signed)
 Patient Advocate Encounter  Completed and sent [employer] Access Copay Card for [place] for this patient    BIN      [id number] PCN    ACCESS  GRP    [id number] ID        [id number]     Roland Earl, CPhT Pharmacy Patient Advocate Specialist [facility] Patient Advocate Team Direct Number: [phone] Fax: [phone]

## 2024-02-27 LAB — CULTURE, BLOOD (ROUTINE X 2)
Culture: NO GROWTH
Culture: NO GROWTH

## 2024-02-27 LAB — AEROBIC/ANAEROBIC CULTURE W GRAM STAIN (SURGICAL/DEEP WOUND)

## 2024-03-12 ENCOUNTER — Emergency Department (HOSPITAL_COMMUNITY)
Admission: EM | Admit: 2024-03-12 | Discharge: 2024-03-14 | Disposition: A | Discharge status: Home / Self Care | Payer: Self-pay | Attending: Emergency Medicine | Admitting: Emergency Medicine

## 2024-03-12 ENCOUNTER — Other Ambulatory Visit: Payer: Self-pay

## 2024-03-12 ENCOUNTER — Ambulatory Visit (HOSPITAL_COMMUNITY)
Admission: EM | Admit: 2024-03-12 | Discharge: 2024-03-12 | Disposition: A | Discharge status: Other Acute Inpatient Hospital

## 2024-03-12 ENCOUNTER — Encounter (HOSPITAL_COMMUNITY): Payer: Self-pay | Admitting: *Deleted

## 2024-03-12 DIAGNOSIS — Z4800 Encounter for change or removal of nonsurgical wound dressing: Secondary | ICD-10-CM | POA: Insufficient documentation

## 2024-03-12 DIAGNOSIS — R45850 Homicidal ideations: Secondary | ICD-10-CM | POA: Diagnosis present

## 2024-03-12 DIAGNOSIS — S61206A Unspecified open wound of right little finger without damage to nail, initial encounter: Secondary | ICD-10-CM

## 2024-03-12 DIAGNOSIS — F489 Nonpsychotic mental disorder, unspecified: Secondary | ICD-10-CM

## 2024-03-12 DIAGNOSIS — Z5189 Encounter for other specified aftercare: Secondary | ICD-10-CM

## 2024-03-12 DIAGNOSIS — Z91010 Allergy to peanuts: Secondary | ICD-10-CM | POA: Diagnosis not present

## 2024-03-12 DIAGNOSIS — R440 Auditory hallucinations: Secondary | ICD-10-CM

## 2024-03-12 DIAGNOSIS — F99 Mental disorder, not otherwise specified: Secondary | ICD-10-CM | POA: Insufficient documentation

## 2024-03-12 LAB — RAPID URINE DRUG SCREEN, HOSP PERFORMED
Amphetamines: POSITIVE — AB
Barbiturates: NOT DETECTED
Benzodiazepines: NOT DETECTED
Cocaine: NOT DETECTED
Opiates: NOT DETECTED
Tetrahydrocannabinol: NOT DETECTED

## 2024-03-12 MED ORDER — DOCUSATE SODIUM 100 MG PO CAPS
100.0000 mg | ORAL_CAPSULE | Freq: Two times a day (BID) | ORAL | Status: DC
Start: 2024-03-12 — End: 2024-03-14
  Administered 2024-03-12 – 2024-03-14 (×4): 100 mg via ORAL
  Filled 2024-03-12 (×4): qty 1

## 2024-03-12 MED ORDER — IBUPROFEN 800 MG PO TABS
800.0000 mg | ORAL_TABLET | Freq: Four times a day (QID) | ORAL | Status: DC | PRN
  Administered 2024-03-14: 800 mg via ORAL
  Filled 2024-03-12 (×2): qty 1

## 2024-03-12 MED ORDER — NYSTATIN 100000 UNIT/ML MT SUSP
5.0000 mL | Freq: Four times a day (QID) | OROMUCOSAL | Status: DC
  Administered 2024-03-12 – 2024-03-14 (×6): 500000 [IU] via ORAL
  Filled 2024-03-12 (×11): qty 5

## 2024-03-12 MED ORDER — VITAMIN C 500 MG PO TABS
1000.0000 mg | ORAL_TABLET | Freq: Every day | ORAL | Status: DC
Start: 2024-03-12 — End: 2024-03-14
  Administered 2024-03-12 – 2024-03-14 (×3): 1000 mg via ORAL
  Filled 2024-03-12 (×3): qty 2

## 2024-03-12 MED ORDER — BICTEGRAVIR-EMTRICITAB-TENOFOV 50-200-25 MG PO TABS
1.0000 | ORAL_TABLET | Freq: Every day | ORAL | Status: DC
  Administered 2024-03-12 – 2024-03-14 (×3): 1 via ORAL
  Filled 2024-03-12 (×3): qty 1

## 2024-03-12 NOTE — ED Notes (Signed)
EMS called to transport pt
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
## 2024-03-12 NOTE — ED Notes (Signed)
 Patient discharged to [facility] ED for medical clearance. Transported by EMS.

## 2024-03-12 NOTE — ED Notes (Signed)
Report given to [facility] ED Charge RN.  

## 2024-03-12 NOTE — Progress Notes (Signed)
 Patient has been denied by [facility] due to no appropriate beds available. Patient meets [facility] inpatient per Rockney Ghee, NP. Patient has been faxed out to the following facilities:   [facility]--[address] [place] [phone]--BMWUX-LKGMWNUU [place]ending - Request [address] [place] [phone]--PIRJJ-OACZYSAY [name]ending - Request Sent--[address] [place], [place] [phone]--CBJSE[facility] HealthPending - Request [facility] [address], [place] [phone]--JKKXF-GHWEX Toma Copier - Request [address] [place] [facility] [phone]--POEUM-PNT [name] HealthPending - Request Sent--[name] [place] [phone]--WPYKD-XIP Luvenia Redden - Request Sent--[name], [phone]--[facility] - Request Sent--[address] [place] [phone]--ZHGDJ-MEQAS [facility] HealthPending - Request [address] [place] [phone]--[facility]JEH[facility]UDJSH [facility] - Request Sent--[address] [place] [phone]--MCNOB-SJGGEZ HealthPending - Request Sent--[address] [place] [phone]--[facility] Patient PlacementPending - Request Sent--[facility], [phone]--[facility] [place]ending - Request Sent--[place] [phone]--PRFFM-BWGYKZ [facility] - Request Sent--[address] [place] [phone]--[facility] - Request [address] [place], [place] [phone]--UQJFH-LKTGYBWL SpringsPending - Request [address] [place] [phone]--BULAG-TXMIWOE [facility] HealthPending - Request [address] [place] [phone]--TUUEK[facility] - Request Sent--[address] [place] [phone]--LMBEM-LJQGB[facility]ending - Request Sent--[address] [place] [facility] [phone]--[facility] HealthcarePending - Request [address] [place] [phone]--  Damita Dunnings, MSW, LCSW-A  11:13 PM 03/12/2024

## 2024-03-12 NOTE — ED Provider Notes (Signed)
 [place] EMERGENCY DEPARTMENT AT [facility] Provider Note   CSN: [id number] Arrival date & time: 03/12/24  1754     History {Add pertinent medical, surgical, social history, OB history to HPI:1} Chief Complaint  Patient presents with   Wound Check    Mark Galloway is a 38 y.o. male.  He was sent over from behavioral health for medical clearance.  He presented there with complaints of HI and hearing voices telling him to hurt other people.  He has no significant medical complaints.  He has had an ongoing infection in his right small finger.  He underwent operative debridement 222 and saw hand surgery again on 3 5.  He said he has been doing Betadine soaks and taking the antibiotics.  Per the last note Dr. Merlyn Lot hand surgery said he needs to be seen again in a week and there is concern that they may have to ultimately do an amputation.  He denies any significant pain at the site.  He said he had a fever today.  There is been no drainage.  He has no sensation there.  Per notes he has an upcoming appointment with Dr. Merlyn Lot on the 17th.  Patient also has a history of HIV and has been taking his Biktarvy  The history is provided by the patient.  Wound Check The current episode started more than 1 week ago. Nothing aggravates the symptoms. Nothing relieves the symptoms. Treatments tried: antibiotics and soaks, dressing changes. The treatment provided mild relief.       Home Medications Prior to Admission medications   Medication Sig Start Date End Date Taking? Authorizing Provider  ascorbic acid (VITAMIN C) 1000 MG tablet Take 1 tablet (1,000 mg total) by mouth daily. 02/24/24   Pokhrel, Rebekah Chesterfield, MD  bictegravir-emtricitabine-tenofovir AF (BIKTARVY) 50-200-25 MG TABS tablet Take 1 tablet by mouth daily. 02/24/24   Pokhrel, Rebekah Chesterfield, MD  docusate sodium (COLACE) 100 MG capsule Take 1 capsule (100 mg total) by mouth 2 (two) times daily. 02/24/24   Pokhrel, Rebekah Chesterfield, MD  ibuprofen (ADVIL)  200 MG tablet Take 4 tablets (800 mg total) by mouth every 6 (six) hours as needed for mild pain (pain score 1-3) or moderate pain (pain score 4-6). 02/24/24   Pokhrel, Rebekah Chesterfield, MD  Menthol-Methyl Salicylate (ICY HOT EXTRA STRENGTH) 10-30 % CREA Apply 1 Application topically as needed (muscle pain).    [provider]  naphazoline-glycerin (CLEAR EYES REDNESS) 0.012-0.25 % SOLN Place 1-2 drops into both eyes 4 (four) times daily as needed for eye irritation.    [provider]  nystatin (MYCOSTATIN) 100000 UNIT/ML suspension Take 5 mLs (500,000 Units total) by mouth 4 (four) times daily. 02/24/24   Pokhrel, Rebekah Chesterfield, MD  sulfamethoxazole-trimethoprim (BACTRIM DS) 800-160 MG tablet Take 1 tablet by mouth 2 (two) times daily. 02/24/24   Pokhrel, Rebekah Chesterfield, MD      Allergies    Peanut oil and Peanut-containing drug products    Review of Systems   Review of Systems  Physical Exam Updated Vital Signs BP 117/67   Pulse 81   Temp 98.3 F (36.8 C)   Resp 20   Wt 74.4 kg   SpO2 98%   BMI 24.22 kg/m  Physical Exam Vitals and nursing note reviewed.  Constitutional:      General: He is not in acute distress.    Appearance: Normal appearance. He is well-developed.  HENT:     Head: Normocephalic and atraumatic.  Eyes:     Conjunctiva/sclera: Conjunctivae  normal.  Cardiovascular:     Rate and Rhythm: Normal rate and regular rhythm.     Heart sounds: No murmur heard. Pulmonary:     Effort: Pulmonary effort is normal. No respiratory distress.     Breath sounds: Normal breath sounds.  Abdominal:     Palpations: Abdomen is soft.     Tenderness: There is no abdominal tenderness.  Musculoskeletal:        General: No swelling.     Cervical back: Neck supple.     Comments: He has an open wound of the palmar aspect of his right fifth digit.  There is no significant drainage.  The distal tip is insensate.  He has no flexion or extension of the digit.  There is no crepitus and no pain  into the palm.  No significant erythema.  Skin:    General: Skin is warm and dry.     Capillary Refill: Capillary refill takes less than 2 seconds.  Neurological:     Mental Status: He is alert.        ED Results / Procedures / Treatments   Labs (all labs ordered are listed, but only abnormal results are displayed) Labs Reviewed - No data to display  EKG None  Radiology No results found.  Procedures Procedures  {Document cardiac monitor, telemetry assessment procedure when appropriate:1}  Medications Ordered in ED Medications - No data to display  ED Course/ Medical Decision Making/ A&P   {   Click here for ABCD2, HEART and other calculatorsREFRESH Note before signing :1}                              Medical Decision Making  This patient complains of ***; this involves an extensive number of treatment Options and is a complaint that carries with it a high risk of complications and morbidity. The differential includes ***  I ordered, reviewed and interpreted labs, which included *** I ordered medication *** and reviewed PMP when indicated. I ordered imaging studies which included *** and I independently    visualized and interpreted imaging which showed *** Additional history obtained from *** Previous records obtained and reviewed *** I consulted *** and discussed lab and imaging findings and discussed disposition.  Cardiac monitoring reviewed, *** Social determinants considered, *** Critical Interventions: ***  After the interventions stated above, I reevaluated the patient and found *** Admission and further testing considered, ***   {Document critical care time when appropriate:1} {Document review of labs and clinical decision tools ie heart score, Chads2Vasc2 etc:1}  {Document your independent review of radiology images, and any outside records:1} {Document your discussion with family members, caretakers, and with consultants:1} {Document social  determinants of health affecting pt's care:1} {Document your decision making why or why not admission, treatments were needed:1} Final Clinical Impression(s) / ED Diagnoses Final diagnoses:  None    Rx / DC Orders ED Discharge Orders     None

## 2024-03-12 NOTE — ED Provider Notes (Signed)
 Behavioral Health Urgent Care Medical Screening Exam  Patient Name: Mark Galloway MRN: [id number] Date of Evaluation: 03/12/24 Chief Complaint:  "I have bipolar disorder and hearing voices".  Diagnosis:  Final diagnoses:  Auditory hallucinations  Homicidal ideations  Open wound of right little finger    History of Present illness: Mark Galloway 38 y.o., male patient presented to [facility] as a voluntary walk in accompanied by his mother with complaints of HI and CAH. 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 Medical hx of HIV and recent cellulitis of right hand requiring I&D on 02/22/24. Current medications: Biktarvy. Mark Galloway, is seen face to face by this provider, consulted with Dr. Enedina Finner and chart reviewed on 03/12/24.  Wound on right little finger was assessed, patient had wound covered with what appeared to be a sock.  The wound is open with dried discharge, pain, swelling, redness and tendon appears to be visible.  Based on chart review patient saw Ortho on 03/04/24 and was started on Bactrim, MRSA was also found in wound, and there was a discussion of possible amputation if needed.  Patient reports that he did not take Bactrim as prescribed and has not been cleaning it properly as patient is currently homeless and does not have access to cleaning needs. Pt is being transferred to [facility] ED for medical treatment of wound.   On evaluation Mark Galloway reports " I have bipolar disorder and have been hearing voices telling him to hurt other people, like the manager at the [facility] suites because he stole my cell phone and called the cops on me, taking about I was trespassing. So I told him I was going to kill him and blow that place up.". Pt denies suicidal ideations and hx of suicide attempts. He states "Im supposed to take meds but I haven't in months. I think I was on Zyprexa and Seroquel at some point. People are always talking about me everywhere I go, so I walk up to them and ask them what  they saying about me".   Mother reports safety concerns for pt and others stating "he walks up in people faces asking them why they are talking about him. He randomly walks into people homes and goes into people's cars. He walks around paranoid and agitated. He is very argumentative and threatening to kill the manager of a hotel he was staying at yesterday and the cops were called for trespassing. He actually has a court date in April 2025. There is also some concern for Meth use, even though he denies it".   During evaluation Mark Galloway is sitting up in assessment room, in no acute distress. He is alert & oriented x 4, restless and distracted for this assessment.  His mood is labile with congruent labile affect.  He has rapid speech, and restless behavior.  Objectively there appears to be evidence of paranoid ideations and internal preoccupation. Pt does not appear to be actively responding to internal or external stimuli.  He denies current suicidal ideations and visual hallucinations. He endorses command auditory hallucinations telling him to hurt other people. Also endorses homicidal ideations towards manager at hotel and made verbal threats to manager yesterday that he was going to kill him and blow the hotel up.    Flowsheet Row ED from 03/12/2024 in [facility] ED from 02/24/2024 in [facility] ED to Hosp-Admission (Discharged) from 02/21/2024 in [name] 6 NORTH  SURGICAL  C-SSRS RISK CATEGORY  No Risk No Risk No Risk       Psychiatric Specialty Exam  Presentation  General Appearance:Disheveled  Eye Contact:Fleeting  Speech:Pressured  Speech Volume:Normal  Handedness:Right   Mood and Affect  Mood: Labile  Affect: Labile   Thought Process  Thought Processes: Coherent  Descriptions of Associations:Circumstantial  Orientation:Full (Time, Place and Person)  Thought Content:Paranoid Ideation;  Scattered  Diagnosis of Schizophrenia or Schizoaffective disorder in past: Yes  Duration of Psychotic Symptoms: Greater than six months  Hallucinations:Auditory; Command CAH telling him to harm others  Ideas of Reference:Paranoia; Percusatory  Suicidal Thoughts:No  Homicidal Thoughts:Yes, Passive With Plan   Sensorium  Memory: Immediate Fair; Recent Fair  Judgment: Poor  Insight: Lacking   Executive Functions  Concentration: Fair  Attention Span: Fair  Recall: [employer] of Knowledge: Fair  Language: Good   Psychomotor Activity  Psychomotor Activity: Restlessness   Assets  Assets: Desire for Improvement; Social Support; Resilience; Communication Skills; Financial Resources/Insurance   Sleep  Sleep: Fair  Number of hours:  6   Physical Exam: Physical Exam Vitals and nursing note reviewed.  HENT:     Head: Normocephalic.     Nose: Nose normal.  Eyes:     Extraocular Movements: Extraocular movements intact.  Cardiovascular:     Rate and Rhythm: Tachycardia present.  Pulmonary:     Effort: Pulmonary effort is normal.  Musculoskeletal:        General: Swelling and tenderness present.     Cervical back: Normal range of motion.     Comments: Open wound present on RT little finger- tendon visible and edema present   Neurological:     General: No focal deficit present.     Mental Status: He is alert and oriented to person, place, and time.    Review of Systems  Constitutional: Negative.   HENT: Negative.    Eyes: Negative.   Respiratory: Negative.    Cardiovascular: Negative.   Gastrointestinal: Negative.   Genitourinary: Negative.   Musculoskeletal:        Pain in RT hand with visible open wound  Neurological: Negative.   Endo/Heme/Allergies: Negative.   Psychiatric/Behavioral:  Positive for hallucinations.    Blood pressure 120/62, pulse (!) 103, temperature 98.6 F (37 C), temperature source Oral, resp. rate 17, SpO2 99%. There  is no height or weight on file to calculate BMI.  Musculoskeletal: Strength & Muscle Tone: within normal limits Gait & Station: normal Patient leans: N/A   [facility] MSE Discharge Disposition for Follow up and Recommendations: Based on my evaluation the patient appears to have an emergency medical condition for which I recommend the patient be transferred to the emergency department for further evaluation.  Pt was sent out to [name] ED, accepting provider is Dr. Rubin Payor, report called and EMTALA completed. Pt has wound on right little finger which was assessed, wound covered with what appeared to be a sock.  The wound is open with dried discharge, pain, swelling, redness and tendon appears to be visible. Pt was agreeable and requesting inpatient psychiatric hospitalization after medically cleared.  He is currently voluntary and may return to [facility] if medical recommendations are within our scope of care.   Howie Ill, NP 03/12/2024, 3:33 PM

## 2024-03-12 NOTE — ED Triage Notes (Signed)
 BIB [facility] from [facility]. Was being seen at [facility] for mental health services, but was instructed to get R hand wound medically cleared/ treated prior to [facility] services. Denies SI. Presents with chronic open R hand wound with pain, and w/o bleeding or drainage, wound reportedly 2/2 "spider bite". Has seen hand surgeon and completed ABT, but describes as not worse, but not getting better. Rates hand pain 8/10. Alert, NAD, calm.

## 2024-03-12 NOTE — Discharge Instructions (Signed)
 Pt transferred to [facility] ED for medical clearance

## 2024-03-12 NOTE — ED Notes (Signed)
 RN just called to check on the EMS transport to [facility] ED. They stated they are tied up in an emergency right now and will get someone over here when they can.

## 2024-03-13 NOTE — Progress Notes (Signed)
 CSW submitted additional BH referral documentation to [facility] for continued review. CSW will continued to monitor the patient to secure recommended disposition.    Damita Dunnings, MSW, LCSW-A  7:33 PM 03/13/2024

## 2024-03-13 NOTE — Progress Notes (Signed)
 Patient has been denied by [facility] due to no appropriate beds available. Patient meets [facility] inpatient criteria per Rockney Ghee, NP. Patient has been faxed out to the following facilities:   [facility] [address] [place]., [place] [address] [phone]  [facility] [address], [place] [address] [phone]  [name] [address] [place], [place] [address] [phone]  [facility] [address], [place] [address] [phone]  [facility] [address] [place] [address] [phone]  [facility] [address] [place] [address] [phone]  [facility] EFAX [address] [place], [place] [phone]  [facility] [address] [place] [address] [phone]  [facility] [address] [place] [address] [phone]  [facility] [address] [place] [address] [phone]  [facility] [address] [place] [address] [phone]  [facility] Patient Placement [facility], [place] [phone]  [facility] [address] [place] [address] [phone]  [facility] [place] [place] [place] [address] [phone]  [facility] [address] [place], [place] [address] [phone]  [name] [address] [place] [address] [phone]  [facility] [address] [place] [address] [phone]  [facility] [address] [place] [address] [phone]  [facility] [address], [place] [address] [phone]  [facility] [address] [place] [address] [phone]   Damita Dunnings, MSW, LCSW-A  11:17 PM 03/13/2024

## 2024-03-14 ENCOUNTER — Inpatient Hospital Stay
Admission: AD | Admit: 2024-03-14 | Discharge: 2024-03-31 | DRG: 885 | Disposition: A | Discharge status: Home / Self Care | Payer: Self-pay | Source: Intra-hospital | Attending: Psychiatry | Admitting: Psychiatry

## 2024-03-14 ENCOUNTER — Encounter: Payer: Self-pay | Admitting: Nurse Practitioner

## 2024-03-14 ENCOUNTER — Other Ambulatory Visit: Payer: Self-pay

## 2024-03-14 DIAGNOSIS — M86149 Other acute osteomyelitis, unspecified hand: Secondary | ICD-10-CM | POA: Diagnosis not present

## 2024-03-14 DIAGNOSIS — A4902 Methicillin resistant Staphylococcus aureus infection, unspecified site: Secondary | ICD-10-CM | POA: Diagnosis not present

## 2024-03-14 DIAGNOSIS — F17290 Nicotine dependence, other tobacco product, uncomplicated: Secondary | ICD-10-CM | POA: Diagnosis present

## 2024-03-14 DIAGNOSIS — L03011 Cellulitis of right finger: Secondary | ICD-10-CM | POA: Diagnosis present

## 2024-03-14 DIAGNOSIS — E871 Hypo-osmolality and hyponatremia: Secondary | ICD-10-CM | POA: Diagnosis not present

## 2024-03-14 DIAGNOSIS — R202 Paresthesia of skin: Secondary | ICD-10-CM | POA: Diagnosis not present

## 2024-03-14 DIAGNOSIS — I10 Essential (primary) hypertension: Secondary | ICD-10-CM | POA: Diagnosis present

## 2024-03-14 DIAGNOSIS — E1165 Type 2 diabetes mellitus with hyperglycemia: Secondary | ICD-10-CM | POA: Diagnosis not present

## 2024-03-14 DIAGNOSIS — T8149XA Infection following a procedure, other surgical site, initial encounter: Principal | ICD-10-CM | POA: Diagnosis present

## 2024-03-14 DIAGNOSIS — M65941 Unspecified synovitis and tenosynovitis, right hand: Secondary | ICD-10-CM | POA: Diagnosis present

## 2024-03-14 DIAGNOSIS — F17210 Nicotine dependence, cigarettes, uncomplicated: Secondary | ICD-10-CM | POA: Diagnosis present

## 2024-03-14 DIAGNOSIS — E1169 Type 2 diabetes mellitus with other specified complication: Secondary | ICD-10-CM | POA: Diagnosis present

## 2024-03-14 DIAGNOSIS — Z5982 Transportation insecurity: Secondary | ICD-10-CM

## 2024-03-14 DIAGNOSIS — L02612 Cutaneous abscess of left foot: Secondary | ICD-10-CM | POA: Diagnosis present

## 2024-03-14 DIAGNOSIS — F1510 Other stimulant abuse, uncomplicated: Secondary | ICD-10-CM | POA: Diagnosis present

## 2024-03-14 DIAGNOSIS — Z91148 Patient's other noncompliance with medication regimen for other reason: Secondary | ICD-10-CM | POA: Diagnosis not present

## 2024-03-14 DIAGNOSIS — Z79899 Other long term (current) drug therapy: Secondary | ICD-10-CM | POA: Diagnosis not present

## 2024-03-14 DIAGNOSIS — S90822A Blister (nonthermal), left foot, initial encounter: Secondary | ICD-10-CM | POA: Diagnosis present

## 2024-03-14 DIAGNOSIS — T368X6A Underdosing of other systemic antibiotics, initial encounter: Secondary | ICD-10-CM | POA: Diagnosis present

## 2024-03-14 DIAGNOSIS — Z91010 Allergy to peanuts: Secondary | ICD-10-CM

## 2024-03-14 DIAGNOSIS — M869 Osteomyelitis, unspecified: Secondary | ICD-10-CM | POA: Diagnosis present

## 2024-03-14 DIAGNOSIS — M1990 Unspecified osteoarthritis, unspecified site: Secondary | ICD-10-CM | POA: Diagnosis present

## 2024-03-14 DIAGNOSIS — L02416 Cutaneous abscess of left lower limb: Secondary | ICD-10-CM | POA: Diagnosis not present

## 2024-03-14 DIAGNOSIS — Z5941 Food insecurity: Secondary | ICD-10-CM

## 2024-03-14 DIAGNOSIS — L03116 Cellulitis of left lower limb: Secondary | ICD-10-CM | POA: Diagnosis present

## 2024-03-14 DIAGNOSIS — F319 Bipolar disorder, unspecified: Secondary | ICD-10-CM | POA: Diagnosis present

## 2024-03-14 DIAGNOSIS — Z560 Unemployment, unspecified: Secondary | ICD-10-CM

## 2024-03-14 DIAGNOSIS — Z5986 Financial insecurity: Secondary | ICD-10-CM

## 2024-03-14 DIAGNOSIS — M65841 Other synovitis and tenosynovitis, right hand: Secondary | ICD-10-CM | POA: Diagnosis not present

## 2024-03-14 DIAGNOSIS — Z8619 Personal history of other infectious and parasitic diseases: Secondary | ICD-10-CM

## 2024-03-14 DIAGNOSIS — T8141XA Infection following a procedure, superficial incisional surgical site, initial encounter: Secondary | ICD-10-CM | POA: Diagnosis present

## 2024-03-14 DIAGNOSIS — Z91120 Patient's intentional underdosing of medication regimen due to financial hardship: Secondary | ICD-10-CM

## 2024-03-14 DIAGNOSIS — Z5902 Unsheltered homelessness: Secondary | ICD-10-CM | POA: Diagnosis not present

## 2024-03-14 DIAGNOSIS — R45850 Homicidal ideations: Secondary | ICD-10-CM | POA: Diagnosis present

## 2024-03-14 DIAGNOSIS — M86141 Other acute osteomyelitis, right hand: Secondary | ICD-10-CM | POA: Diagnosis not present

## 2024-03-14 DIAGNOSIS — B9562 Methicillin resistant Staphylococcus aureus infection as the cause of diseases classified elsewhere: Secondary | ICD-10-CM | POA: Diagnosis present

## 2024-03-14 DIAGNOSIS — B20 Human immunodeficiency virus [HIV] disease: Secondary | ICD-10-CM | POA: Diagnosis present

## 2024-03-14 DIAGNOSIS — Z5900 Homelessness unspecified: Secondary | ICD-10-CM | POA: Diagnosis not present

## 2024-03-14 DIAGNOSIS — B9620 Unspecified Escherichia coli [E. coli] as the cause of diseases classified elsewhere: Secondary | ICD-10-CM | POA: Diagnosis not present

## 2024-03-14 DIAGNOSIS — Z818 Family history of other mental and behavioral disorders: Secondary | ICD-10-CM

## 2024-03-14 DIAGNOSIS — L0390 Cellulitis, unspecified: Secondary | ICD-10-CM | POA: Diagnosis present

## 2024-03-14 DIAGNOSIS — A539 Syphilis, unspecified: Secondary | ICD-10-CM | POA: Diagnosis not present

## 2024-03-14 DIAGNOSIS — F200 Paranoid schizophrenia: Principal | ICD-10-CM | POA: Diagnosis present

## 2024-03-14 DIAGNOSIS — Z72820 Sleep deprivation: Secondary | ICD-10-CM

## 2024-03-14 DIAGNOSIS — M65949 Unspecified synovitis and tenosynovitis, unspecified hand: Secondary | ICD-10-CM

## 2024-03-14 DIAGNOSIS — Z1612 Extended spectrum beta lactamase (ESBL) resistance: Secondary | ICD-10-CM | POA: Diagnosis not present

## 2024-03-14 LAB — CBC WITH DIFFERENTIAL/PLATELET
Abs Immature Granulocytes: 0.01 10*3/uL (ref 0.00–0.07)
Basophils Absolute: 0 10*3/uL (ref 0.0–0.1)
Basophils Relative: 1 %
Eosinophils Absolute: 0.4 10*3/uL (ref 0.0–0.5)
Eosinophils Relative: 8 %
HCT: 35.1 % — ABNORMAL LOW (ref 39.0–52.0)
Hemoglobin: 11.4 g/dL — ABNORMAL LOW (ref 13.0–17.0)
Immature Granulocytes: 0 %
Lymphocytes Relative: 28 %
Lymphs Abs: 1.2 10*3/uL (ref 0.7–4.0)
MCH: 32.4 pg (ref 26.0–34.0)
MCHC: 32.5 g/dL (ref 30.0–36.0)
MCV: 99.7 fL (ref 80.0–100.0)
Monocytes Absolute: 0.5 10*3/uL (ref 0.1–1.0)
Monocytes Relative: 12 %
Neutro Abs: 2.2 10*3/uL (ref 1.7–7.7)
Neutrophils Relative %: 51 %
Platelets: 313 10*3/uL (ref 150–400)
RBC: 3.52 MIL/uL — ABNORMAL LOW (ref 4.22–5.81)
RDW: 14.7 % (ref 11.5–15.5)
WBC: 4.3 10*3/uL (ref 4.0–10.5)
nRBC: 0 % (ref 0.0–0.2)

## 2024-03-14 LAB — TSH: TSH: 0.159 u[IU]/mL — ABNORMAL LOW (ref 0.350–4.500)

## 2024-03-14 LAB — COMPREHENSIVE METABOLIC PANEL
ALT: 12 U/L (ref 0–44)
AST: 23 U/L (ref 15–41)
Albumin: 3.4 g/dL — ABNORMAL LOW (ref 3.5–5.0)
Alkaline Phosphatase: 70 U/L (ref 38–126)
Anion gap: 10 (ref 5–15)
BUN: 10 mg/dL (ref 6–20)
CO2: 21 mmol/L — ABNORMAL LOW (ref 22–32)
Calcium: 9 mg/dL (ref 8.9–10.3)
Chloride: 102 mmol/L (ref 98–111)
Creatinine, Ser: 0.79 mg/dL (ref 0.61–1.24)
GFR, Estimated: >60 mL/min (ref >60)
Glucose, Bld: 114 mg/dL — ABNORMAL HIGH (ref 70–99)
Potassium: 4.4 mmol/L (ref 3.5–5.1)
Sodium: 133 mmol/L — ABNORMAL LOW (ref 135–145)
Total Bilirubin: <0.2 mg/dL (ref 0.0–1.2)
Total Protein: 7.6 g/dL (ref 6.5–8.1)

## 2024-03-14 MED ORDER — NYSTATIN 100000 UNIT/ML MT SUSP
5.0000 mL | Freq: Four times a day (QID) | OROMUCOSAL | Status: DC
  Administered 2024-03-14 – 2024-03-31 (×64): 500000 [IU] via ORAL
  Filled 2024-03-14 (×70): qty 5

## 2024-03-14 MED ORDER — MAGNESIUM HYDROXIDE 400 MG/5ML PO SUSP: 30.0000 mL | Freq: Every day | ORAL | Status: DC | PRN

## 2024-03-14 MED ORDER — DOCUSATE SODIUM 100 MG PO CAPS
100.0000 mg | ORAL_CAPSULE | Freq: Two times a day (BID) | ORAL | Status: DC
  Administered 2024-03-14 – 2024-03-31 (×34): 100 mg via ORAL
  Filled 2024-03-14 (×34): qty 1

## 2024-03-14 MED ORDER — HALOPERIDOL LACTATE 5 MG/ML IJ SOLN: 10.0000 mg | Freq: Three times a day (TID) | INTRAMUSCULAR | Status: DC | PRN

## 2024-03-14 MED ORDER — BICTEGRAVIR-EMTRICITAB-TENOFOV 50-200-25 MG PO TABS
1.0000 | ORAL_TABLET | Freq: Every day | ORAL | Status: DC
  Administered 2024-03-15 – 2024-03-31 (×17): 1 via ORAL
  Filled 2024-03-14 (×18): qty 1

## 2024-03-14 MED ORDER — LORAZEPAM 2 MG/ML IJ SOLN
2.0000 mg | Freq: Three times a day (TID) | INTRAMUSCULAR | Status: DC | PRN
  Administered 2024-03-23: 2 mg via INTRAMUSCULAR

## 2024-03-14 MED ORDER — VITAMIN C 500 MG PO TABS
1000.0000 mg | ORAL_TABLET | Freq: Every day | ORAL | Status: DC
  Administered 2024-03-15 – 2024-03-31 (×17): 1000 mg via ORAL
  Filled 2024-03-14 (×17): qty 2

## 2024-03-14 MED ORDER — HALOPERIDOL LACTATE 5 MG/ML IJ SOLN: 5.0000 mg | Freq: Three times a day (TID) | INTRAMUSCULAR | Status: DC | PRN

## 2024-03-14 MED ORDER — DIPHENHYDRAMINE HCL 50 MG/ML IJ SOLN: 50.0000 mg | Freq: Three times a day (TID) | INTRAMUSCULAR | Status: DC | PRN

## 2024-03-14 MED ORDER — HALOPERIDOL 5 MG PO TABS
5.0000 mg | ORAL_TABLET | Freq: Three times a day (TID) | ORAL | Status: DC | PRN
  Administered 2024-03-23 – 2024-03-27 (×2): 5 mg via ORAL
  Filled 2024-03-14 (×2): qty 1

## 2024-03-14 MED ORDER — OLANZAPINE 5 MG PO TBDP
5.0000 mg | ORAL_TABLET | Freq: Two times a day (BID) | ORAL | Status: DC
  Administered 2024-03-14 – 2024-03-20 (×12): 5 mg via ORAL
  Filled 2024-03-14 (×12): qty 1

## 2024-03-14 MED ORDER — ALUM & MAG HYDROXIDE-SIMETH 200-200-20 MG/5ML PO SUSP: 30.0000 mL | ORAL | Status: DC | PRN

## 2024-03-14 MED ORDER — IBUPROFEN 600 MG PO TABS
800.0000 mg | ORAL_TABLET | Freq: Four times a day (QID) | ORAL | Status: DC | PRN
  Administered 2024-03-16 – 2024-03-29 (×18): 800 mg via ORAL
  Filled 2024-03-14 (×19): qty 1

## 2024-03-14 MED ORDER — OLANZAPINE 5 MG PO TBDP
5.0000 mg | ORAL_TABLET | Freq: Two times a day (BID) | ORAL | Status: DC
  Administered 2024-03-14: 5 mg via ORAL
  Filled 2024-03-14: qty 1

## 2024-03-14 MED ORDER — DIPHENHYDRAMINE HCL 25 MG PO CAPS
50.0000 mg | ORAL_CAPSULE | Freq: Three times a day (TID) | ORAL | Status: DC | PRN
  Administered 2024-03-16 – 2024-03-27 (×3): 50 mg via ORAL
  Filled 2024-03-14 (×3): qty 2

## 2024-03-14 MED ORDER — HYDROXYZINE HCL 25 MG PO TABS
25.0000 mg | ORAL_TABLET | Freq: Three times a day (TID) | ORAL | Status: DC | PRN
  Administered 2024-03-15 – 2024-03-30 (×9): 25 mg via ORAL
  Filled 2024-03-14 (×9): qty 1

## 2024-03-14 MED ORDER — LORAZEPAM 2 MG/ML IJ SOLN
2.0000 mg | Freq: Three times a day (TID) | INTRAMUSCULAR | Status: DC | PRN
  Filled 2024-03-14: qty 1

## 2024-03-14 NOTE — Progress Notes (Addendum)
 LCSW Progress Note:   Cully Luckow MRN: [id number]  03/14/2024 10:49 PM  Patient meets BH inpatient criteria per Rockney Ghee, NP. Patient has been denied by [facility] due to no appropriate beds available. Patient has been faxed out to alternative facilities for consideration of bed placement.   Destination  Service Provider Request Status Services Address Phone Fax Patient Preferred  [facility] Pending - Request Sent -- [address] [place] [place] [address] [phone] --  [facility] Pending - Request Sent -- [address], [place] [address] [phone] --  [facility] Pending - Request Sent -- [address] [place], [place] [address] [phone] --  [facility] Pending - Request Sent -- [address], [place] [address] [phone] --  [facility] Pending - Request Sent -- [address] [place] [address] [phone] --  [facility] Pending - Request Sent -- [address] [name] [place] [address] [phone] --  [facility] Pending - Request Sent -- [address] [name] [place] [phone] --  [facility] Pending - Request Sent -- [address] [place] [address] [phone] --  [facility] Pending - Request Sent -- [address] [place] [address] [phone] --  [facility] Pending - Request Sent -- [address] [place] [address] [phone] --  [facility] Pending - Request Sent -- [address] [place] [address] [phone] --  [facility] Patient Placement Pending - Request Sent -- [facility],  [place] [phone] --  [facility] Pending - Request Rhina Brackett [place] [address] [phone] --  [facility] Pending - Request Sent -- [facility] [address]egin[address] [place] [address] [phone] --  [facility] Pending - Request Sent -- [address] [place], [place] [address] [phone] --  [facility] Pending - Request Sent -- [address] [place] [address] [phone] --  [facility] Pending - Request Sent -- [address], [place] [address] [phone] --  [facility] Pending - Request Sent -- [address] [place] [address] [phone] --  [facility] Pending - Request Sent -- [address] [place] [address] [phone] --  [facility] Pending - Request Sent -- [address], [place] [address] [phone]

## 2024-03-14 NOTE — Group Note (Signed)
 Date:  03/14/2024 Time:  9:06 PM  Group Topic/Focus:  Developing a Wellness Toolbox:   The focus of this group is to help patients develop a "wellness toolbox" with skills and strategies to promote recovery upon discharge. Identifying Needs:   The focus of this group is to help patients identify their personal needs that have been historically problematic and identify healthy behaviors to address their needs. Overcoming Stress:   The focus of this group is to define stress and help patients assess their triggers.    Participation Level:  Active  Participation Quality:  Appropriate  Affect:  Appropriate  Cognitive:  Appropriate  Insight: Appropriate  Engagement in Group:  Engaged  Modes of Intervention:  Discussion  Additional Comments:  n/a  Lorenda Ishihara 03/14/2024, 9:06 PM

## 2024-03-14 NOTE — ED Provider Notes (Addendum)
  Physical Exam  BP 132/66 (BP Location: Left Arm)   Pulse 94   Temp 98.4 F (36.9 C) (Oral)   Resp 18   Wt 74.4 kg   SpO2 100%   BMI 24.22 kg/m   Physical Exam  Procedures  Procedures  ED Course / MDM   Clinical Course as of 03/14/24 1057  Thu Mar 12, 2024  2146 I reviewed case with PA Mayford Knife at [facility].  He said he does not need any labs other than a UDS.  Psychiatry is working on his disposition.  I will order his home meds. [MB]    Clinical Course User Index [MB] Terrilee Files, MD   Medical Decision Making Amount and/or Complexity of Data Reviewed Labs: ordered.  Risk OTC drugs. Prescription drug management. Decision regarding hospitalization.   Patient pending inpatient psychiatric placement.  However appears to to have appointment with hand surgery on the 17th as an outpatient.   Patient has been accepted [place].  Medically cleared.  Reevaluated prior to transfer.      Mark Core, MD 03/14/24 1057    Mark Core, MD 03/14/24 1302

## 2024-03-14 NOTE — Progress Notes (Signed)
 ADMISSION NOTE:   Pt is a 38 y.o. male who was voluntarily [facility] to [facility].  Pt stated, "I came here because of my hand, I had a spider bite and came here to get it fixed." Pt is alert and oriented x 4.  Pt present with blunted affect and pleasant mood. Pt denies SI, HI and/or AVH.  Pt stated, "I might not have anywhere to go when I live here." He reports that he talks to his mom and sister almost everyday, however, unsure if they will him stay with them after discharge. Pt stated that his major stressor is housing and "my money situation." Pt denied using tobacco/alcohol/illicit drugs. Pt stated that his goals are to "exercise" and "my menta health."   Pt disclosed that he has had one previous hospitalizations this year at [name] for pneumonia.  Pt reports he has medical insurance, and have no issues obtaining medications that are prescribed to him.   RN and pt discussed pt's admission plan of care and consents signed.  Pt verbalized understanding of plan of care.  RN oriented pt to the staff, unit, and room.  Skin assessment was completed.  Incision/wound on right hand little finger, dressed with gauze and secured with coban.  Pt's belongings secured in locker. Routine safety checks initiated.  Pt is safe on the unit.

## 2024-03-14 NOTE — Plan of Care (Signed)
  Problem: Education: Goal: Mental status will improve Outcome: Progressing   Problem: Physical Regulation: Goal: Ability to maintain clinical measurements within normal limits will improve Outcome: Progressing   Problem: Safety: Goal: Periods of time without injury will increase Outcome: Progressing

## 2024-03-15 DIAGNOSIS — F319 Bipolar disorder, unspecified: Principal | ICD-10-CM

## 2024-03-15 LAB — HEMOGLOBIN A1C
Hgb A1c MFr Bld: 5.1 % (ref 4.8–5.6)
Mean Plasma Glucose: 99.67 mg/dL

## 2024-03-15 LAB — LIPID PANEL
Cholesterol: 108 mg/dL (ref 0–200)
HDL: 39 mg/dL — ABNORMAL LOW (ref >40)
LDL Cholesterol: 48 mg/dL (ref 0–99)
Total CHOL/HDL Ratio: 2.8 ratio
Triglycerides: 106 mg/dL (ref <150)
VLDL: 21 mg/dL (ref 0–40)

## 2024-03-15 LAB — T4, FREE: Free T4: 0.54 ng/dL — ABNORMAL LOW (ref 0.61–1.12)

## 2024-03-15 MED ORDER — MELATONIN 5 MG PO TABS
5.0000 mg | ORAL_TABLET | Freq: Every day | ORAL | Status: DC
  Administered 2024-03-15 – 2024-03-30 (×16): 5 mg via ORAL
  Filled 2024-03-15 (×16): qty 1

## 2024-03-15 NOTE — Plan of Care (Signed)
  Problem: Education: Goal: Verbalization of understanding the information provided will improve Outcome: Progressing   Problem: Education: Goal: Mental status will improve Outcome: Progressing   Problem: Education: Goal: Emotional status will improve Outcome: Progressing

## 2024-03-15 NOTE — [facility] Suicide Risk Assessment (Signed)
 Suicide Risk Assessment  Admission Assessment    [facility] Admission Suicide Risk Assessment   Nursing information obtained from:  Patient Demographic factors:  Male Current Mental Status:  NA Loss Factors:  Financial problems / change in socioeconomic status Historical Factors:  NA Risk Reduction Factors:  Positive social support  Total Time spent with patient: 2 hours Principal Problem: Paranoid schizophrenia ([name]) Diagnosis:  Principal Problem:   Paranoid schizophrenia ([name])  Subjective Data:  38 year old never married African-American male with reported history of bipolar disorder, stimulant use disorder, medical history of AIDS, syphilis, cellulitis of right fifth digit with recent debridement, presented to [facility] on 3/13 accompanied by his mother, with chief complaint of depression and anxiety, thoughts of wanting to hurt manager at [employer] weeks the day prior to presenting, lack of sleep, homelessness, abnormal behaviors such as going into people's homes uninvited as well as their vehicles.   Patient is seen for evaluation today, he reports that his mother made him come to the hospital because he is currently in between places, has been homeless for the last 2 weeks.  States that she thinks that " it will help" for him to be here at the hospital.  States that prior to becoming homeless he was living with his mother however they are no longer able to live in their home, does not provide a reason why.  Some inconsistencies with reports of hallucinations.  Initially stating that he has been hearing voices daily for the last 2 weeks, later during evaluation he denies hearing any voices over the last 2 weeks, and that the last time he experienced auditory hallucinations was 2 weeks ago.  Does report that he hears voices on and off, usually 5-6 male and male voices, does not recognize them.  Voices are not worse at a certain time.  He denies any recent command auditory hallucinations.   Denies history of visual hallucinations.  States that recently he wanted to hurt the manager at [facility] in, " I was going to slap him".  He denies wanting to kill this person.  Reports depressed mood for the last 2 months, reduced sleep only getting about 3 hours at night, states he usually has no energy, reduced appetite however it is improving since he has been here at the hospital, endorses hopelessness, worthlessness due to living situation, denies any guilt.  He does report history of mania in the past.  Has not been taking any medications for the last year, does not follow up with an outpatient mental health provider.  Denies any previous suicide attempts.  Patient states that his mother and sister are very supportive.  Denies history of physical or sexual abuse.  Reports that he has a bachelor's degree in business management.  Unemployed, never married, no children.  Agnostic beliefs.  Denies any access to weapons or firearms.  Does report a history of trespassing for which he has been into jail for a couple days each time.  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  Reports the longest he has gone without using methamphetamine was about 6 months in 2022.  Denies any current SI/HI and AVH.     UDS positive for amphetamines  Continued Clinical Symptoms:  Alcohol Use Disorder Identification Test Final Score (AUDIT): 0 The "Alcohol Use Disorders Identification Test", Guidelines for Use in Primary Care, Second Edition.  World Science writer [facility]). Score between 0-7:  no  or low risk or alcohol related problems. Score between 8-15:  moderate risk of alcohol related problems. Score between 16-19:  high risk of alcohol related problems. Score 20 or above:  warrants further diagnostic evaluation for alcohol dependence and treatment.   CLINICAL FACTORS:   Alcohol/Substance Abuse/Dependencies Unstable or Poor  Therapeutic Relationship Previous Psychiatric Diagnoses and Treatments Medical Diagnoses and Treatments/Surgeries   Musculoskeletal: Strength & Muscle Tone: within normal limits Gait & Station: normal Patient leans: N/A  Psychiatric Specialty Exam:  Presentation  General Appearance:  Disheveled  Eye Contact: Fleeting  Speech: Pressured  Speech Volume: Normal  Handedness: Right   Mood and Affect  Mood: Labile  Affect: Labile   Thought Process  Thought Processes: Coherent  Descriptions of Associations:Circumstantial  Orientation:Full (Time, Place and Person)  Thought Content:Paranoid Ideation; Scattered  History of Schizophrenia/Schizoaffective disorder:No (per his report, he has not)  Duration of Psychotic Symptoms:Less than six months  Hallucinations:No data recorded Ideas of Reference:Paranoia; Percusatory  Suicidal Thoughts:No data recorded Homicidal Thoughts:No data recorded  Sensorium  Memory: Immediate Fair; Recent Fair  Judgment: Poor  Insight: Lacking   Executive Functions  Concentration: Fair  Attention Span: Fair  Recall: Fair  Fund of Knowledge: Fair  Language: Good   Psychomotor Activity  Psychomotor Activity:No data recorded  Assets  Assets: Desire for Improvement; Social Support; Resilience; Communication Skills; Financial Resources/Insurance   Sleep  Sleep:No data recorded   Physical Exam: Physical Exam Vitals and nursing note reviewed.  HENT:     Head: Normocephalic and atraumatic.  Eyes:     Extraocular Movements: Extraocular movements intact.  Pulmonary:     Effort: Pulmonary effort is normal.  Musculoskeletal:     Cervical back: Normal range of motion.     Comments: Right pinky finger in bandage reported  Per chart review underwent operative debridement 2/22 and saw hand surgery again on 3/ 5  Skin:    General: Skin is dry.  Neurological:     Mental Status: He is alert and oriented to  person, place, and time.  Psychiatric:        Attention and Perception: Attention and perception normal.        Mood and Affect: Mood and affect normal.        Speech: Speech normal.        Behavior: Behavior normal. Behavior is cooperative.        Thought Content: Thought content normal.        Cognition and Memory: Cognition and memory normal.        Judgment: Judgment is impulsive.      Review of Systems  Psychiatric/Behavioral:  Positive for substance abuse.   All other systems reviewed and are negative.  Blood pressure 112/70, pulse 84, temperature 98.3 F (36.8 C), temperature source Tympanic, resp. rate 18, height 5\' 9"  (1.753 m), weight 72.1 kg, SpO2 100%. Body mass index is 23.48 kg/m.   COGNITIVE FEATURES THAT CONTRIBUTE TO RISK:  Closed-mindedness    SUICIDE RISK:   Mild:  Suicidal ideation of limited frequency, intensity, duration, and specificity.  There are no identifiable plans, no associated intent, mild dysphoria and related symptoms, good self-control (both objective and subjective assessment), few other risk factors, and identifiable protective factors, including available and accessible social support.  PLAN OF CARE:  Crisis stabilization, psychiatric evaluation, therapeutic milieu, group participation, medication management for depressed mood, substance abuse, medical comorbidities and development of an individualized safety plan.  I certify that inpatient services furnished can reasonably  be expected to improve the patient's condition.   Jatziri Goffredo, PA-C 03/15/2024, 1:13 PM

## 2024-03-15 NOTE — Plan of Care (Signed)
  Problem: Education: Goal: Mental status will improve Outcome: Progressing   Problem: Activity: Goal: Interest or engagement in activities will improve Outcome: Progressing   Problem: Safety: Goal: Periods of time without injury will increase Outcome: Progressing

## 2024-03-15 NOTE — Consult Note (Signed)
 WOC Nurse Consult Note: Reason for Consult: wound Patient has been followed by hand surgeon. Last seen 03/04/24, noted may not be able to save finger.  Wound type: abscess; s/p I&D 2 /22/25 Pressure Injury POA: NA Measurement:see nursing flow sheets Wound VHQ:IONGEX exposure, minimally pink, pale  Drainage (amount, consistency, odor) see nursing notes Periwound: intact  Dressing procedure/placement/frequency: Per POC established by hand surgeon.  Cleanse finger wound with Vashe [place] # [phone]), dry Apply hydrogel [place] [phone]) to wound bed, cover with dry dressing.  Change daily   FU with Dr. Karlyn Agee   Re consult if needed, will not follow at this time. Thanks  Breeanna Galgano [employer], RN,CWOCN, CNS, CWON-AP [phone])

## 2024-03-15 NOTE — Progress Notes (Signed)
   03/14/24 2200  Psych Admission Type (Psych Patients Only)  Admission Status Voluntary  Psychosocial Assessment  Patient Complaints None  Eye Contact Brief  Facial Expression Flat  Affect Blunted  Speech Logical/coherent  Interaction Minimal;Guarded  Motor Activity Slow  Appearance/Hygiene Body odor;Disheveled;Poor hygiene  Behavior Characteristics Cooperative  Mood Pleasant  Thought Process  Coherency WDL  Content WDL  Delusions WDL  Perception WDL  Hallucination None reported or observed  Judgment WDL  Confusion WDL  Danger to Self  Current suicidal ideation? Denies  Danger to Others  Danger to Others None reported or observed   Patient is alert and oriented x 4, affect is congruent with mood, thoughts are organized, he forwards very little, minimal eye contact,he isolated to the room most of the shift. No distress noted, he denies SI/HI/AVH, 15 minutes safety checks maintained will continue to monitor.

## 2024-03-15 NOTE — Progress Notes (Signed)
   03/15/24 0825  Psych Admission Type (Psych Patients Only)  Admission Status Voluntary  Psychosocial Assessment  Patient Complaints None  Eye Contact Brief  Facial Expression Flat  Affect Blunted  Speech Logical/coherent  Interaction Minimal;Guarded  Motor Activity Slow  Appearance/Hygiene Body odor;Disheveled;Poor hygiene  Behavior Characteristics Cooperative  Mood Pleasant  Thought Process  Coherency WDL  Content WDL  Delusions None reported or observed  Perception WDL  Hallucination None reported or observed  Judgment WDL  Confusion WDL  Danger to Self  Current suicidal ideation? Denies  Danger to Others  Danger to Others None reported or observed

## 2024-03-15 NOTE — Group Note (Signed)
 Date:  03/15/2024 Time:  8:48 PM  Group Topic/Focus:  Coping With Mental Health Crisis:   The purpose of this group is to help patients identify strategies for coping with mental health crisis.  Group discusses possible causes of crisis and ways to manage them effectively.    Participation Level:  Active  Participation Quality:  Appropriate  Affect:  Appropriate  Cognitive:  Appropriate  Insight: Appropriate  Engagement in Group:  Engaged  Modes of Intervention:  Activity  Additional Comments:    Erma Joubert 03/15/2024, 8:48 PM

## 2024-03-15 NOTE — H&P (Signed)
 Psychiatric Admission Assessment Adult  Patient Identification: Mark Galloway MRN:  [id number] Date of Evaluation:  03/15/2024 Chief Complaint:  Paranoid schizophrenia ([name]) [F20.0] Principal Diagnosis: Bipolar 1 disorder ([name]) Diagnosis:  Principal Problem:   Bipolar 1 disorder ([name])   History of Present Illness:  38 year old never married African-American male with reported history of bipolar disorder, stimulant use disorder, medical history of AIDS, syphilis, cellulitis of right fifth digit with recent debridement, presented to [facility] on 3/13 accompanied by his mother, with chief complaint of depression and anxiety, thoughts of wanting to hurt manager at [employer] weeks the day prior to presenting, lack of sleep, homelessness, abnormal behaviors such as going into people's homes uninvited as well as their vehicles.  Patient is seen for evaluation today, he reports that his mother made him come to the hospital because he is currently in between places, has been homeless for the last 2 weeks.  States that she thinks that " it will help" for him to be here at the hospital.  States that prior to becoming homeless he was living with his mother however they are no longer able to live in their home, does not provide a reason why.  Some inconsistencies with reports of hallucinations.  Initially stating that he has been hearing voices daily for the last 2 weeks, later during evaluation he denies hearing any voices over the last 2 weeks, and that the last time he experienced auditory hallucinations was 2 weeks ago.  Does report that he hears voices on and off, usually 5-6 male and male voices, does not recognize them.  Voices are not worse at a certain time.  He denies any recent command auditory hallucinations.  Denies history of visual hallucinations.  States that recently he wanted to hurt the manager at [facility] in, " I was going to slap him".  He denies wanting to kill this person.  Reports  depressed mood for the last 2 months, reduced sleep only getting about 3 hours at night, states he usually has no energy, reduced appetite however it is improving since he has been here at the hospital, endorses hopelessness, worthlessness due to living situation, denies any guilt.  He does report history of mania in the past.  Has not been taking any medications for the last year, does not follow up with an outpatient mental health provider.  Denies any previous suicide attempts.  Patient states that his mother and sister are very supportive.  Denies history of physical or sexual abuse.  Reports that he has a bachelor's degree in business management.  Unemployed, never married, no children.  Agnostic beliefs.  Denies any access to weapons or firearms.  Does report a history of trespassing for which he has been into jail for a couple days each time.  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  Reports the longest he has gone without using methamphetamine was about 6 months in 2022.  Denies any current SI/HI and AVH.   UDS positive for amphetamines  Associated Signs/Symptoms: Depression Symptoms:  depressed mood, feelings of worthlessness/guilt, hopelessness, loss of energy/fatigue, disturbed sleep, decreased appetite, (Hypo) Manic Symptoms:  Impulsivity, Anxiety Symptoms:   none  Psychotic Symptoms:   none reported PTSD Symptoms: NA Total Time spent with patient: 2 hours  Past Psychiatric History:  DX: Per chart review-bipolar disorder with psychotic features Outpatient provider: None Current caregiver:  Patient is own guardian/ care giver Past  hospitalizations: [facility] 2021 for mania and psychosis, patient reports in 2024 at a facility in [place] x 3 weeks Medication trials : Zyprexa, per chart review-lithium Suicide attempts: Patient denies Patient denies ever having an Act/CST team. Denies ECT, Clozaril  treatments.  Is the patient at risk to self? No.  Has the patient been a risk to self in the past 6 months? No.  Has the patient been a risk to self within the distant past? No.  Is the patient a risk to others? No.  Has the patient been a risk to others in the past 6 months? Yes.    Has the patient been a risk to others within the distant past? No.   [place] Scale:  Flowsheet Row Admission (Current) from 03/14/2024 in [facility] INPATIENT BEHAVIORAL MEDICINE Most recent reading at 03/14/2024  3:00 PM ED from 03/12/2024 in [facility] Emergency Department at [facility] Most recent reading at 03/12/2024 11:07 PM ED from 03/12/2024 in [facility] Most recent reading at 03/12/2024  2:15 PM  C-SSRS RISK CATEGORY No Risk No Risk No Risk        Prior Inpatient Therapy: Yes.   If yes, describe see above Prior Outpatient Therapy: No.  Alcohol Screening: 1. How often do you have a drink containing alcohol?: Never 2. How many drinks containing alcohol do you have on a typical day when you are drinking?: 1 or 2 3. How often do you have six or more drinks on one occasion?: Never AUDIT-C Score: 0 4. How often during the last year have you found that you were not able to stop drinking once you had started?: Never 5. How often during the last year have you failed to do what was normally expected from you because of drinking?: Never 6. How often during the last year have you needed a first drink in the morning to get yourself going after a heavy drinking session?: Never 7. How often during the last year have you had a feeling of guilt of remorse after drinking?: Never 8. How often during the last year have you been unable to remember what happened the night before because you had been drinking?: Never 9. Have you or someone else been injured as a result of your drinking?: No 10. Has a relative or friend or a doctor or another health worker been concerned about your drinking  or suggested you cut down?: No Alcohol Use Disorder Identification Test Final Score (AUDIT): 0 Alcohol Brief Interventions/Follow-up: Patient Refused Substance Abuse History in the last 12 months:  Yes.   Consequences of Substance Abuse: Legal Consequences:  Trespassing Previous Psychotropic Medications: Yes  Psychological Evaluations: No  Past Medical History:  Past Medical History:  Diagnosis Date   Bipolar 1 disorder ([name])    HIV infection ([name])    Schizophrenia ([name])    Shingles     Past Surgical History:  Procedure Laterality Date   I & D EXTREMITY Right 02/22/2024   Procedure: IRRIGATION AND DEBRIDEMENT EXTREMITY;  Surgeon: Betha Loa, MD;  Location: [facility] OR;  Service: Orthopedics;  Laterality: Right;   IRRIGATION AND DEBRIDEMENT FOOT Left    Family History: History reviewed. No pertinent family history. Family Psychiatric  History:  Patient reports maternal grandmother-schizophrenia Patient denies any family history of suicide, substance use disorder  Tobacco Screening:  Social History   Tobacco Use  Smoking Status Some Days   Types: Cigars, Cigarettes  Smokeless Tobacco Never  Tobacco Comments   2 per  week    BH Tobacco Counseling     Are you interested in Tobacco Cessation Medications?  No value filed. Counseled patient on smoking cessation:  No value filed. Reason Tobacco Screening Not Completed: No value filed.       Social History:  Social History   Substance and Sexual Activity  Alcohol Use Yes   Comment: 3-4 per week     Social History   Substance and Sexual Activity  Drug Use Never    Additional Social History:                           Allergies:   Allergies  Allergen Reactions   Peanut Oil Hives   Peanut-Containing Drug Products    Lab Results:  Results for orders placed or performed during the hospital encounter of 03/14/24 (from the past 48 hours)  Lipid panel     Status: Abnormal   Collection Time: 03/15/24  2:25 PM   Result Value Ref Range   Cholesterol 108 0 - 200 mg/dL   Triglycerides 409 <811 mg/dL   HDL 39 (L) >91 mg/dL   Total CHOL/HDL Ratio 2.8 RATIO   VLDL 21 0 - 40 mg/dL   LDL Cholesterol 48 0 - 99 mg/dL    Comment:        Total Cholesterol/HDL:CHD Risk Coronary Heart Disease Risk Table                     Men   Women  1/2 Average Risk   3.4   3.3  Average Risk       5.0   4.4  2 X Average Risk   9.6   7.1  3 X Average Risk  23.4   11.0        Use the calculated Patient Ratio above and the CHD Risk Table to determine the patient's CHD Risk.        ATP III CLASSIFICATION (LDL):  <100     mg/dL   Optimal  478-295  mg/dL   Near or Above                    Optimal  130-159  mg/dL   Borderline  621-308  mg/dL   High  >657     mg/dL   Very High Performed at [facility], [address] [place], [place] [address]   T4, free     Status: Abnormal   Collection Time: 03/15/24  2:25 PM  Result Value Ref Range   Free T4 0.54 (L) 0.61 - 1.12 ng/dL    Comment: (NOTE) Biotin ingestion may interfere with free T4 tests. If the results are inconsistent with the TSH level, previous test results, or the clinical presentation, then consider biotin interference. If needed, order repeat testing after stopping biotin. Performed at [facility], [address] [place], [place] [address]     Blood Alcohol level:  Lab Results  Component Value Date   [facility] <10 10/01/2022   ETH <10 03/25/2020    Metabolic Disorder Labs:  No results found for: "HGBA1C", "MPG" No results found for: "PROLACTIN" Lab Results  Component Value Date   CHOL 108 03/15/2024   TRIG 106 03/15/2024   HDL 39 (L) 03/15/2024   CHOLHDL 2.8 03/15/2024   VLDL 21 03/15/2024   LDLCALC 48 03/15/2024   LDLCALC 40 11/07/2022    Current Medications: Current Facility-Administered Medications  Medication  Dose Route Frequency Provider Last Rate Last Admin   alum & mag hydroxide-simeth (MAALOX/MYLANTA)  200-200-20 MG/5ML suspension 30 mL  30 mL Oral Q4H PRN Zyriah Mask, PA-C       ascorbic acid (VITAMIN C) tablet 1,000 mg  1,000 mg Oral Daily Lashan Macias, PA-C   1,000 mg at 03/15/24 1610   bictegravir-emtricitabine-tenofovir AF (BIKTARVY) 50-200-25 MG per tablet 1 tablet  1 tablet Oral Daily Annleigh Knueppel, PA-C   1 tablet at 03/15/24 9604   haloperidol (HALDOL) tablet 5 mg  5 mg Oral TID PRN Laporscha Linehan, Judeth Cornfield, PA-C       And   diphenhydrAMINE (BENADRYL) capsule 50 mg  50 mg Oral TID PRN Bradly Sangiovanni, PA-C       haloperidol lactate (HALDOL) injection 5 mg  5 mg Intramuscular TID PRN Alanie Syler, PA-C       And   diphenhydrAMINE (BENADRYL) injection 50 mg  50 mg Intramuscular TID PRN Gregory Dowe, PA-C       And   LORazepam (ATIVAN) injection 2 mg  2 mg Intramuscular TID PRN Dauntae Derusha, PA-C       haloperidol lactate (HALDOL) injection 10 mg  10 mg Intramuscular TID PRN Sadhana Frater, PA-C       And   diphenhydrAMINE (BENADRYL) injection 50 mg  50 mg Intramuscular TID PRN Aydon Swamy, PA-C       And   LORazepam (ATIVAN) injection 2 mg  2 mg Intramuscular TID PRN Kaylanie Capili, PA-C       docusate sodium (COLACE) capsule 100 mg  100 mg Oral BID Haedyn Breau, PA-C   100 mg at 03/15/24 5409   hydrOXYzine (ATARAX) tablet 25 mg  25 mg Oral TID PRN Arlet Marter, PA-C       ibuprofen (ADVIL) tablet 800 mg  800 mg Oral Q6H PRN Shamonica Schadt, PA-C       magnesium hydroxide (MILK OF MAGNESIA) suspension 30 mL  30 mL Oral Daily PRN Xayvion Shirah, PA-C       nystatin (MYCOSTATIN) 100000 UNIT/ML suspension 500,000 Units  5 mL Oral QID Bari Handshoe, PA-C   500,000 Units at 03/15/24 1250   OLANZapine zydis (ZYPREXA) disintegrating tablet 5 mg  5 mg Oral BID Lonya Johannesen, PA-C   5 mg at 03/15/24 0825   PTA Medications: Medications Prior to Admission  Medication Sig Dispense Refill Last  Dose/Taking   bictegravir-emtricitabine-tenofovir AF (BIKTARVY) 50-200-25 MG TABS tablet Take 1 tablet by mouth daily. 30 tablet 0 Past Month   ascorbic acid (VITAMIN C) 1000 MG tablet Take 1 tablet (1,000 mg total) by mouth daily. (Patient not taking: Reported on 03/14/2024)   Not Taking   sulfamethoxazole-trimethoprim (BACTRIM DS) 800-160 MG tablet Take 1 tablet by mouth 2 (two) times daily. (Patient not taking: Reported on 03/14/2024) 10 tablet 0 Not Taking    Musculoskeletal: Strength & Muscle Tone: within normal limits Gait & Station: normal Patient leans: N/A            Psychiatric Specialty Exam:  Presentation  General Appearance:  Disheveled  Eye Contact: Good  Speech: Normal Rate  Speech Volume: Normal  Handedness: Right   Mood and Affect  Mood: Euthymic  Affect: Appropriate   Thought Process  Thought Processes: Linear  Duration of Psychotic Symptoms: X 2 months Past Diagnosis of Schizophrenia or Psychoactive disorder: No  Descriptions of Associations:Intact  Orientation:Full (Time, Place and Person)  Thought Content:Logical  Hallucinations:Hallucinations: None  Ideas of Reference:None  Suicidal Thoughts:Suicidal Thoughts:  No  Homicidal Thoughts:Homicidal Thoughts: No   Sensorium  Memory: Immediate Good; Recent Fair; Remote Fair  Judgment: Poor  Insight: Shallow   Executive Functions  Concentration: Good  Attention Span: Good  Recall: Good  Fund of Knowledge: Fair  Language: Fair   Psychomotor Activity  Psychomotor Activity:Psychomotor Activity: Normal   Assets  Assets: Communication Skills; Desire for Improvement; Social Support   Sleep  Sleep:Sleep: Poor Number of Hours of Sleep: 3    Physical Exam: Physical Exam Vitals and nursing note reviewed.  HENT:     Head: Normocephalic and atraumatic.  Eyes:     Extraocular Movements: Extraocular movements intact.  Pulmonary:     Effort: Pulmonary  effort is normal.  Musculoskeletal:     Cervical back: Normal range of motion.     Comments: Right pinky finger in bandage reported  Per chart review underwent operative debridement 2/22 and saw hand surgery again on 3/ 5  Skin:    General: Skin is dry.  Neurological:     Mental Status: He is alert and oriented to person, place, and time.  Psychiatric:        Attention and Perception: Attention and perception normal.        Mood and Affect: Mood and affect normal.        Speech: Speech normal.        Behavior: Behavior normal. Behavior is cooperative.        Thought Content: Thought content normal.        Cognition and Memory: Cognition and memory normal.        Judgment: Judgment is impulsive.    Review of Systems  Psychiatric/Behavioral:  Positive for substance abuse.   All other systems reviewed and are negative.  Blood pressure 112/70, pulse 84, temperature 98.3 F (36.8 C), temperature source Tympanic, resp. rate 18, height 5\' 9"  (1.753 m), weight 72.1 kg, SpO2 100%. Body mass index is 23.48 kg/m.  Treatment Plan Summary: Daily contact with patient to assess and evaluate symptoms and progress in treatment and Medication management 38 year old male with reported history of bipolar disorder with psychotic features per chart review, stimulant use disorder, noncompliance with medication, medical history of AIDS, syphilis.  Cellulitis of right pinky finger, recently had I&D, debridement, is following up with orthopedic, the recommendation is to have amputation.  UDS is positive for amphetamines, patient reports last use a week ago.  Seems he has been hospitalized about 1-2 times in the past for psychosis/mania, does not follow up with outpatient mental health services.  Currently calm and cooperative, upon presentation to the ED was reporting command auditory hallucination telling him to harm others, homicidal ideation towards hotel Production designer, [employer]/television[employer].  Today on evaluation, he reports last having  hallucinations about 2 weeks ago, denies wanting to kill this manager of the hotel, instead states that he just wishes he could slap him.  No overt symptoms of psychosis, mania at this time.  He is endorsing some depression related to recent homelessness.  Admitted to [facility] inpatient behavioral health unit for mania and psychosis back in 2021, it does not appear that his UDS was positive for any illicit substances at the time.  Observation Level/Precautions:  15 minute checks  Laboratory:   HIV RNA, RPR, CD4 ct, lipid panel, a1c  Psychotherapy:    Medications:  zyprexa 5 bid   Consultations:  Infectious disease  Discharge Concerns:  substance use, no compliance  Estimated LOS: 3-7 days   Other:  Physician Treatment Plan for Primary Diagnosis: Bipolar 1 disorder ([name]) 1.    Safety and Monitoring:   --  Voluntary admission to inpatient psychiatric unit for safety, stabilization and treatment -- Daily contact with patient to assess and evaluate symptoms and progress in treatment -- Patient's case to be discussed in multi-disciplinary team meeting -- Observation Level : q15 minute checks -- Vital signs:  q12 hours -- Precautions: suicide   2. Psychiatric Diagnoses and Treatment:   -- Continue Zyprexa 5 mg BID for mood/psychosis -- Continue hydroxyzine 25 mg TID PRN -- Start melatonin 5 mg HS for sleep aid  -- Labs ordered for HbA1c and lipid panel   --  The risks/benefits/side-effects/alternatives to this medication were discussed in detail with the patient and time was given for questions. The patient consents to medication trial. -- Metabolic profile and EKG monitoring obtained while on an atypical antipsychotic  -- Encouraged patient to participate in unit milieu and in scheduled group therapies -- Short Term Goals: Ability to identify changes in lifestyle to reduce recurrence of condition will improve, Ability to verbalize feelings will improve, Ability to disclose and discuss  suicidal ideas, Ability to demonstrate self-control will improve, Ability to identify and develop effective coping behaviors will improve, Ability to maintain clinical measurements within normal limits will improve, Compliance with prescribed medications will improve, and Ability to identify triggers associated with substance abuse/mental health issues will improve -- Long Term Goals: Improvement in symptoms so as ready for discharge        3. Medical Issues Being Addressed:               Hx of syphilis  -- labs for RPR, ID consult    AIDS  -- Continue home med Biktarvy 1 tab daily  -- ID consult - Labs for HIV 1 RNA, CD4 count    Cellulitis of R. Fifth digit  -- Per chart review underwent operative debridement 2/22 and saw hand surgery again on 3/ 5. Is followed by Dr. Lucianne Muss orthopedics  -- Wound care consult    4. Discharge Planning:   -- Social work and case management to assist with discharge planning and identification of hospital follow-up needs prior to discharge -- Estimated LOS: 5-7 days -- Discharge Concerns: Need to establish a safety plan; Medication compliance and effectiveness -- Discharge Goals: Return home with outpatient referrals for mental health follow-up including medication management/psychotherapy    I certify that inpatient services furnished can reasonably be expected to improve the patient's condition.    [address] Yaslene Lindamood, PA-C 3/16/20253:43 PM

## 2024-03-16 DIAGNOSIS — B20 Human immunodeficiency virus [HIV] disease: Secondary | ICD-10-CM | POA: Diagnosis not present

## 2024-03-16 DIAGNOSIS — A539 Syphilis, unspecified: Secondary | ICD-10-CM | POA: Diagnosis not present

## 2024-03-16 DIAGNOSIS — F319 Bipolar disorder, unspecified: Secondary | ICD-10-CM | POA: Diagnosis not present

## 2024-03-16 LAB — T-HELPER CELLS CD4/CD8 %
% CD 4 Pos. Lymph.: 12.5 % — ABNORMAL LOW (ref 30.8–58.5)
Absolute CD 4 Helper: 163 /uL — ABNORMAL LOW (ref 359–1519)
Basophils Absolute: 0 10*3/uL (ref 0.0–0.2)
Basos: 1 %
CD3+CD4+ Cells/CD3+CD8+ Cells Bld: 0.19 — ABNORMAL LOW (ref 0.92–3.72)
CD3+CD8+ Cells # Bld: 858 /uL (ref 109–897)
CD3+CD8+ Cells NFr Bld: 66 % — ABNORMAL HIGH (ref 12.0–35.5)
EOS (ABSOLUTE): 0.4 10*3/uL (ref 0.0–0.4)
Eos: 11 %
Hematocrit: 36.5 % — ABNORMAL LOW (ref 37.5–51.0)
Hemoglobin: 11.9 g/dL — ABNORMAL LOW (ref 13.0–17.7)
Immature Grans (Abs): 0 10*3/uL (ref 0.0–0.1)
Immature Granulocytes: 0 %
Lymphocytes Absolute: 1.3 10*3/uL (ref 0.7–3.1)
Lymphs: 38 %
MCH: 32.6 pg (ref 26.6–33.0)
MCHC: 32.6 g/dL (ref 31.5–35.7)
MCV: 100 fL — ABNORMAL HIGH (ref 79–97)
Monocytes Absolute: 0.4 10*3/uL (ref 0.1–0.9)
Monocytes: 13 %
Neutrophils Absolute: 1.2 10*3/uL — ABNORMAL LOW (ref 1.4–7.0)
Neutrophils: 37 %
Platelets: 317 10*3/uL (ref 150–450)
RBC: 3.65 x10E6/uL — ABNORMAL LOW (ref 4.14–5.80)
RDW: 14.4 % (ref 11.6–15.4)
WBC: 3.3 10*3/uL — ABNORMAL LOW (ref 3.4–10.8)

## 2024-03-16 LAB — RPR
RPR Ser Ql: REACTIVE — AB
RPR Titer: 1:32 {titer}

## 2024-03-16 LAB — HIV-1 RNA QUANT-NO REFLEX-BLD
HIV 1 RNA Quant: 80 {copies}/mL
LOG10 HIV-1 RNA: 1.903 {Log_copies}/mL

## 2024-03-16 NOTE — Group Note (Signed)
 Date:  03/16/2024 Time:  5:23 PM  Group Topic/Focus:  Wellness Toolbox:   The focus of this group is to discuss various aspects of wellness, balancing those aspects and exploring ways to increase the ability to experience wellness.  Patients will create a wellness toolbox for use upon discharge.    Participation Level:  Active  Participation Quality:  Appropriate  Affect:  Appropriate  Cognitive:  Appropriate  Insight: Appropriate  Engagement in Group:  Engaged  Modes of Intervention:  Activity  Additional Comments:    Wilford Corner 03/16/2024, 5:23 PM

## 2024-03-16 NOTE — Progress Notes (Addendum)
 [name] MD Progress Note  03/16/2024 8:53 PM Mark Galloway  MRN:  [id number] Subjective:  38 year old African American male,presented to the Interdisciplinary Team (IDT) meeting stating, "I just want to get back on my medications." He was observed with a right fifth digit wound covered by an old, soiled dressing with serosanguinous exudate and a foul odor. The patient denies pain but exhibits capillary refill greater than 3 seconds and inability to move the affected digit. An orthopedic consult has been requested for further evaluation and treatment recommendations.e patient presents with ongoing psychiatric needs, a severe untreated hand wound, and functional impairments requiring further psychiatric stabilization and medical intervention. The risk of worsening infection and potential systemic complications necessitates close monitoring and coordination with orthopedic specialists. Given his poor hygiene, flat affect, minimal interaction, and disorganized behavior, continued inpatient treatment is medically necessary for psychotropic stabilization, wound care, and interdisciplinary support. Principal Problem: Bipolar 1 disorder ([name]) Diagnosis: Principal Problem:   Bipolar 1 disorder ([name]) Active Problems:   Cellulitis   Paranoid schizophrenia ([name])  Total Time spent with patient: 2 hours  Past Psychiatric History: see below  Past Medical History:  Past Medical History:  Diagnosis Date   Bipolar 1 disorder ([name])    HIV infection ([name])    Schizophrenia ([name])    Shingles     Past Surgical History:  Procedure Laterality Date   I & D EXTREMITY Right 02/22/2024   Procedure: IRRIGATION AND DEBRIDEMENT EXTREMITY;  Surgeon: Betha Loa, MD;  Location: [facility] OR;  Service: Orthopedics;  Laterality: Right;   IRRIGATION AND DEBRIDEMENT FOOT Left    Family History: History reviewed. No pertinent family history. Family Psychiatric  History: none reported Social History:  Social History   Substance and  Sexual Activity  Alcohol Use Yes   Comment: 3-4 per week     Social History   Substance and Sexual Activity  Drug Use Never    Social History   Socioeconomic History   Marital status: Single    Spouse name: Not on file   Number of children: 0   Years of education: 16   Highest education level: Not on file  Occupational History   Occupation: Public relations account executive  Tobacco Use   Smoking status: Some Days    Types: Cigars, Cigarettes   Smokeless tobacco: Never   Tobacco comments:    2 per week  Vaping Use   Vaping status: Never Used  Substance and Sexual Activity   Alcohol use: Yes    Comment: 3-4 per week   Drug use: Never   Sexual activity: Not Currently    Comment: accepted condoms  Other Topics Concern   Not on file  Social History Narrative   Not on file   Social Drivers of Health   Financial Resource Strain: High Risk (01/04/2024)   Received from [facility]   Overall Financial Resource Strain (CARDIA)    Difficulty of Paying Living Expenses: Very hard  Food Insecurity: Food Insecurity Present (03/14/2024)   Hunger Vital Sign    Worried About Running Out of Food in the Last Year: Sometimes true    Ran Out of Food in the Last Year: Sometimes true  Transportation Needs: No Transportation Needs (03/14/2024)   PRAPARE - Administrator, Civil Service (Medical): No    Lack of Transportation (Non-Medical): No  Recent Concern: Transportation Needs - Unmet Transportation Needs (02/23/2024)   PRAPARE - Transportation    Lack of Transportation (Medical): Yes    Lack  of Transportation (Non-Medical): Yes  Physical Activity: Not on file  Stress: Not on file  Social Connections: Unknown (05/04/2022)   Received from [name]   Social Network    Social Network: Not on file   Additional Social History:                         Sleep: Good  Appetite:  Good  Current Medications: Current Facility-Administered Medications   Medication Dose Route Frequency Provider Last Rate Last Admin   alum & mag hydroxide-simeth (MAALOX/MYLANTA) 200-200-20 MG/5ML suspension 30 mL  30 mL Oral Q4H PRN Tingling, Stephanie, PA-C       ascorbic acid (VITAMIN C) tablet 1,000 mg  1,000 mg Oral Daily Tingling, Stephanie, PA-C   1,000 mg at 03/16/24 0835   bictegravir-emtricitabine-tenofovir AF (BIKTARVY) 50-200-25 MG per tablet 1 tablet  1 tablet Oral Daily Tingling, Stephanie, PA-C   1 tablet at 03/16/24 1610   haloperidol (HALDOL) tablet 5 mg  5 mg Oral TID PRN Tingling, Stephanie, PA-C       And   diphenhydrAMINE (BENADRYL) capsule 50 mg  50 mg Oral TID PRN Tingling, Stephanie, PA-C       haloperidol lactate (HALDOL) injection 5 mg  5 mg Intramuscular TID PRN Tingling, Stephanie, PA-C       And   diphenhydrAMINE (BENADRYL) injection 50 mg  50 mg Intramuscular TID PRN Tingling, Stephanie, PA-C       And   LORazepam (ATIVAN) injection 2 mg  2 mg Intramuscular TID PRN Tingling, Stephanie, PA-C       haloperidol lactate (HALDOL) injection 10 mg  10 mg Intramuscular TID PRN Tingling, Stephanie, PA-C       And   diphenhydrAMINE (BENADRYL) injection 50 mg  50 mg Intramuscular TID PRN Tingling, Stephanie, PA-C       And   LORazepam (ATIVAN) injection 2 mg  2 mg Intramuscular TID PRN Tingling, Stephanie, PA-C       docusate sodium (COLACE) capsule 100 mg  100 mg Oral BID Tingling, Stephanie, PA-C   100 mg at 03/16/24 1757   hydrOXYzine (ATARAX) tablet 25 mg  25 mg Oral TID PRN Tingling, Stephanie, PA-C   25 mg at 03/15/24 2207   ibuprofen (ADVIL) tablet 800 mg  800 mg Oral Q6H PRN Tingling, Stephanie, PA-C       magnesium hydroxide (MILK OF MAGNESIA) suspension 30 mL  30 mL Oral Daily PRN Tingling, Stephanie, PA-C       melatonin tablet 5 mg  5 mg Oral QHS Tingling, Stephanie, PA-C   5 mg at 03/15/24 2207   nystatin (MYCOSTATIN) 100000 UNIT/ML suspension 500,000 Units  5 mL Oral QID Tingling, Stephanie, PA-C   500,000 Units at 03/16/24 1757    OLANZapine zydis (ZYPREXA) disintegrating tablet 5 mg  5 mg Oral BID Tingling, Stephanie, PA-C   5 mg at 03/16/24 9604    Lab Results:  Results for orders placed or performed during the hospital encounter of 03/14/24 (from the past 48 hours)  Hemoglobin A1c     Status: None   Collection Time: 03/15/24  2:25 PM  Result Value Ref Range   Hgb A1c MFr Bld 5.1 4.8 - 5.6 %    Comment: (NOTE) Pre diabetes:          5.7%-6.4%  Diabetes:              >6.4%  Glycemic control for   <7.0% adults  with diabetes    Mean Plasma Glucose 99.67 mg/dL    Comment: Performed at [facility], [address], [place], [place] [address]  RPR     Status: Abnormal   Collection Time: 03/15/24  2:25 PM  Result Value Ref Range   RPR Ser Ql Reactive (A) NON REACTIVE    Comment: SENT FOR CONFIRMATION   RPR Titer 1:32     Comment: Performed at [facility], [address], [place], [place] [address]  Lipid panel     Status: Abnormal   Collection Time: 03/15/24  2:25 PM  Result Value Ref Range   Cholesterol 108 0 - 200 mg/dL   Triglycerides 433 <295 mg/dL   HDL 39 (L) >18 mg/dL   Total CHOL/HDL Ratio 2.8 RATIO   VLDL 21 0 - 40 mg/dL   LDL Cholesterol 48 0 - 99 mg/dL    Comment:        Total Cholesterol/HDL:CHD Risk Coronary Heart Disease Risk Table                     Men   Women  1/2 Average Risk   3.4   3.3  Average Risk       5.0   4.4  2 X Average Risk   9.6   7.1  3 X Average Risk  23.4   11.0        Use the calculated Patient Ratio above and the CHD Risk Table to determine the patient's CHD Risk.        ATP III CLASSIFICATION (LDL):  <100     mg/dL   Optimal  841-660  mg/dL   Near or Above                    Optimal  130-159  mg/dL   Borderline  630-160  mg/dL   High  >109     mg/dL   Very High Performed at [facility], [address] [place], [place] [address]   T4, free     Status: Abnormal   Collection Time: 03/15/24  2:25 PM  Result Value Ref Range    Free T4 0.54 (L) 0.61 - 1.12 ng/dL    Comment: (NOTE) Biotin ingestion may interfere with free T4 tests. If the results are inconsistent with the TSH level, previous test results, or the clinical presentation, then consider biotin interference. If needed, order repeat testing after stopping biotin. Performed at [facility], [address] [place], [place] [address]   T-helper cells CD4/CD8 %     Status: Abnormal   Collection Time: 03/15/24  2:25 PM  Result Value Ref Range   Absolute CD 4 Helper 163 (L) 359 - 1,519 /uL   % CD 4 Pos. Lymph. 12.5 (L) 30.8 - 58.5 %   CD3+CD8+ Cells # Bld 858 109 - 897 /uL   CD3+CD8+ Cells NFr Bld 66.0 (H) 12.0 - 35.5 %   CD3+CD4+ Cells/CD3+CD8+ Cells Bld 0.19 (L) 0.92 - 3.72   WBC 3.3 (L) 3.4 - 10.8 x10E3/uL   RBC 3.65 (L) 4.14 - 5.80 x10E6/uL   Hemoglobin 11.9 (L) 13.0 - 17.7 g/dL   Hematocrit 25.4 (L) 27.0 - 51.0 %   MCV 100 (H) 79 - 97 fL   MCH 32.6 26.6 - 33.0 pg   MCHC 32.6 31.5 - 35.7 g/dL   RDW 62.3 76.2 - 83.1 %   Platelets 317 150 - 450 x10E3/uL  Neutrophils 37 Not Estab. %   Lymphs 38 Not Estab. %   Monocytes 13 Not Estab. %   Eos 11 Not Estab. %   Basos 1 Not Estab. %   Neutrophils Absolute 1.2 (L) 1.4 - 7.0 x10E3/uL   Lymphocytes Absolute 1.3 0.7 - 3.1 x10E3/uL   Monocytes Absolute 0.4 0.1 - 0.9 x10E3/uL   EOS (ABSOLUTE) 0.4 0.0 - 0.4 x10E3/uL   Basophils Absolute 0.0 0.0 - 0.2 x10E3/uL   Immature Granulocytes 0 Not Estab. %   Immature Grans (Abs) 0.0 0.0 - 0.1 x10E3/uL    Comment: (NOTE) Performed At: [facility] [address] [place], [place] [id number] Jolene Schimke MD WU:[id number]     Blood Alcohol level:  Lab Results  Component Value Date   [facility] <10 10/01/2022   ETH <10 03/25/2020    Metabolic Disorder Labs: Lab Results  Component Value Date   HGBA1C 5.1 03/15/2024   MPG 99.67 03/15/2024   No results found for: "PROLACTIN" Lab Results  Component Value Date   CHOL 108 03/15/2024   TRIG  106 03/15/2024   HDL 39 (L) 03/15/2024   CHOLHDL 2.8 03/15/2024   VLDL 21 03/15/2024   LDLCALC 48 03/15/2024   LDLCALC 40 11/07/2022    Physical Findings: AIMS:  , ,  ,  ,    CIWA:    COWS:     Musculoskeletal: Strength & Muscle Tone: within normal limits Gait & Station: normal Patient leans: N/A  Psychiatric Specialty Exam:  Presentation  General Appearance:  Disheveled (with noticeable body odor)  Eye Contact: Minimal  Speech: Clear and Coherent; Normal Rate  Speech Volume: Normal  Handedness: Right   Mood and Affect  Mood: Anxious (Pleasant)  Affect: Flat; Blunt   Thought Process  Thought Processes: Goal Directed  Descriptions of Associations:Intact  Orientation:Full (Time, Place and Person)  Thought Content:Logical  History of Schizophrenia/Schizoaffective disorder:Yes  Duration of Psychotic Symptoms:Greater than six months  Hallucinations:Hallucinations: None Description of Command Hallucinations: denies  Ideas of Reference:None  Suicidal Thoughts:Suicidal Thoughts: No  Homicidal Thoughts:Homicidal Thoughts: No HI Passive Intent and/or Plan: -- (denies at this time)   Sensorium  Memory: Immediate Fair; Recent Good; Remote Good  Judgment: Poor  Insight: Poor   Executive Functions  Concentration: Fair  Attention Span: Fair  Recall: Fair  Fund of Knowledge: Good  Language: Good   Psychomotor Activity  Psychomotor Activity: Psychomotor Activity: Normal   Assets  Assets: Communication Skills; Housing; Social Support   Sleep  Sleep: Sleep: Good Number of Hours of Sleep: 7    Physical Exam: Physical Exam Vitals and nursing note reviewed.  Constitutional:      Appearance: Normal appearance.  HENT:     Head: Normocephalic and atraumatic.     Nose: Nose normal.  Pulmonary:     Effort: Pulmonary effort is normal.  Musculoskeletal:        General: Normal range of motion.  Skin:    Findings: Lesion  present.     Comments: Right fifth digit malodorous Right hand  Neurological:     General: No focal deficit present.     Mental Status: He is alert. Mental status is at baseline.  Psychiatric:        Attention and Perception: Attention and perception normal.        Mood and Affect: Mood is anxious. Affect is blunt and flat.        Speech: Speech normal.        Behavior: Behavior  normal. Behavior is cooperative.        Thought Content: Thought content normal.        Cognition and Memory: Cognition and memory normal.        Judgment: Judgment is impulsive.    Review of Systems  Psychiatric/Behavioral:  Positive for substance abuse. The patient is nervous/anxious.   All other systems reviewed and are negative.  Blood pressure (!) 130/113, pulse 93, temperature 97.6 F (36.4 C), resp. rate 18, height 5\' 9"  (1.753 m), weight 72.1 kg, SpO2 100%. Body mass index is 23.48 kg/m.   Treatment Plan Summary: Daily contact with patient to assess and evaluate symptoms and progress in treatment and Medication management Continue Zyprexa 5 mg BID for mood stabilization and psychosis management. Continue Hydroxyzine 25 mg TID PRN for anxiety. Start Melatonin 5 mg HS for sleep aid. Orthopedic consultation requested for further assessment of the right fifth digit wound due to impaired capillary refill and lack of mobility. Laboratory workup ordered, including HbA1c and lipid panel for metabolic monitoring. Myriam Forehand, NP 03/16/2024, 8:53 PM

## 2024-03-16 NOTE — BH IP Treatment Plan (Signed)
 Interdisciplinary Treatment and Diagnostic Plan Update  03/16/2024 Time of Session: 3:00PM Ruel Dimmick MRN: [id number]  Principal Diagnosis: Bipolar 1 disorder [facility])  Secondary Diagnoses: Principal Problem:   Bipolar 1 disorder ([name])   Current Medications:  Current Facility-Administered Medications  Medication Dose Route Frequency Provider Last Rate Last Admin   alum & mag hydroxide-simeth (MAALOX/MYLANTA) 200-200-20 MG/5ML suspension 30 mL  30 mL Oral Q4H PRN Tingling, Stephanie, PA-C       ascorbic acid (VITAMIN C) tablet 1,000 mg  1,000 mg Oral Daily Tingling, Stephanie, PA-C   1,000 mg at 03/16/24 0835   bictegravir-emtricitabine-tenofovir AF (BIKTARVY) 50-200-25 MG per tablet 1 tablet  1 tablet Oral Daily Tingling, Stephanie, PA-C   1 tablet at 03/16/24 9562   haloperidol (HALDOL) tablet 5 mg  5 mg Oral TID PRN Tingling, Stephanie, PA-C       And   diphenhydrAMINE (BENADRYL) capsule 50 mg  50 mg Oral TID PRN Tingling, Stephanie, PA-C       haloperidol lactate (HALDOL) injection 5 mg  5 mg Intramuscular TID PRN Tingling, Stephanie, PA-C       And   diphenhydrAMINE (BENADRYL) injection 50 mg  50 mg Intramuscular TID PRN Tingling, Stephanie, PA-C       And   LORazepam (ATIVAN) injection 2 mg  2 mg Intramuscular TID PRN Tingling, Stephanie, PA-C       haloperidol lactate (HALDOL) injection 10 mg  10 mg Intramuscular TID PRN Tingling, Stephanie, PA-C       And   diphenhydrAMINE (BENADRYL) injection 50 mg  50 mg Intramuscular TID PRN Tingling, Stephanie, PA-C       And   LORazepam (ATIVAN) injection 2 mg  2 mg Intramuscular TID PRN Tingling, Stephanie, PA-C       docusate sodium (COLACE) capsule 100 mg  100 mg Oral BID Tingling, Stephanie, PA-C   100 mg at 03/16/24 1308   hydrOXYzine (ATARAX) tablet 25 mg  25 mg Oral TID PRN Tingling, Stephanie, PA-C   25 mg at 03/15/24 2207   ibuprofen (ADVIL) tablet 800 mg  800 mg Oral Q6H PRN Tingling, Stephanie, PA-C       magnesium hydroxide  (MILK OF MAGNESIA) suspension 30 mL  30 mL Oral Daily PRN Tingling, Stephanie, PA-C       melatonin tablet 5 mg  5 mg Oral QHS Tingling, Stephanie, PA-C   5 mg at 03/15/24 2207   nystatin (MYCOSTATIN) 100000 UNIT/ML suspension 500,000 Units  5 mL Oral QID Tingling, Stephanie, PA-C   500,000 Units at 03/16/24 1150   OLANZapine zydis (ZYPREXA) disintegrating tablet 5 mg  5 mg Oral BID Tingling, Stephanie, PA-C   5 mg at 03/16/24 6578   PTA Medications: Medications Prior to Admission  Medication Sig Dispense Refill Last Dose/Taking   bictegravir-emtricitabine-tenofovir AF (BIKTARVY) 50-200-25 MG TABS tablet Take 1 tablet by mouth daily. 30 tablet 0 Past Month   ascorbic acid (VITAMIN C) 1000 MG tablet Take 1 tablet (1,000 mg total) by mouth daily. (Patient not taking: Reported on 03/14/2024)   Not Taking   sulfamethoxazole-trimethoprim (BACTRIM DS) 800-160 MG tablet Take 1 tablet by mouth 2 (two) times daily. (Patient not taking: Reported on 03/14/2024) 10 tablet 0 Not Taking    Patient Stressors:    Patient Strengths:    Treatment Modalities: Medication Management, Group therapy, Case management,  1 to 1 session with clinician, Psychoeducation, Recreational therapy.   Physician Treatment Plan for Primary Diagnosis: Bipolar 1 disorder ([name]) Long Term Goal(s):  Short Term Goals:    Medication Management: Evaluate patient's response, side effects, and tolerance of medication regimen.  Therapeutic Interventions: 1 to 1 sessions, Unit Group sessions and Medication administration.  Evaluation of Outcomes: Not Met  Physician Treatment Plan for Secondary Diagnosis: Principal Problem:   Bipolar 1 disorder ([name])  Long Term Goal(s):     Short Term Goals:       Medication Management: Evaluate patient's response, side effects, and tolerance of medication regimen.  Therapeutic Interventions: 1 to 1 sessions, Unit Group sessions and Medication administration.  Evaluation of Outcomes: Not  Met   RN Treatment Plan for Primary Diagnosis: Bipolar 1 disorder ([name]) Long Term Goal(s): Knowledge of disease and therapeutic regimen to maintain health will improve  Short Term Goals: Ability to demonstrate self-control, Ability to participate in decision making will improve, Ability to verbalize feelings will improve, Ability to disclose and discuss suicidal ideas, Ability to identify and develop effective coping behaviors will improve, and Compliance with prescribed medications will improve  Medication Management: RN will administer medications as ordered by provider, will assess and evaluate patient's response and provide education to patient for prescribed medication. RN will report any adverse and/or side effects to prescribing provider.  Therapeutic Interventions: 1 on 1 counseling sessions, Psychoeducation, Medication administration, Evaluate responses to treatment, Monitor vital signs and CBGs as ordered, Perform/monitor CIWA, COWS, AIMS and Fall Risk screenings as ordered, Perform wound care treatments as ordered.  Evaluation of Outcomes: Not Met   LCSW Treatment Plan for Primary Diagnosis: Bipolar 1 disorder ([name]) Long Term Goal(s): Safe transition to appropriate next level of care at discharge, Engage patient in therapeutic group addressing interpersonal concerns.  Short Term Goals: Engage patient in aftercare planning with referrals and resources, Increase social support, Increase ability to appropriately verbalize feelings, Increase emotional regulation, Facilitate acceptance of mental health diagnosis and concerns, Facilitate patient progression through stages of change regarding substance use diagnoses and concerns, Identify triggers associated with mental health/substance abuse issues, and Increase skills for wellness and recovery  Therapeutic Interventions: Assess for all discharge needs, 1 to 1 time with Social worker, Explore available resources and support systems, Assess  for adequacy in community support network, Educate family and significant other(s) on suicide prevention, Complete Psychosocial Assessment, Interpersonal group therapy.  Evaluation of Outcomes: Not Met   Progress in Treatment: Attending groups: No. Participating in groups: No. Taking medication as prescribed: Yes. Toleration medication: Yes. Family/Significant other contact made: Yes, individual(s) contacted:  SPE completed with the patient's mother.  Patient understands diagnosis: Yes. Discussing patient identified problems/goals with staff: Yes. Medical problems stabilized or resolved: Yes. Denies suicidal/homicidal ideation: Yes. Issues/concerns per patient self-inventory: No. Other: none  New problem(s) identified: No, Describe:  none  New Short Term/Long Term Goal(s): detox, elimination of symptoms of psychosis, medication management for mood stabilization; elimination of SI thoughts; development of comprehensive mental wellness/sobriety plan.   Patient Goals:  "improve my mental health, my anxiety and my depression"  Discharge Plan or Barriers: Patient that he will need housing assistance.  Patient will also need a wound and or surgical consult in regards to his hand.  Patient is agreeable to referral for mental health treatment at discharge.   Reason for Continuation of Hospitalization: Anxiety Depression Homicidal ideation Medication stabilization  Estimated Length of Stay:  1-7 days  Last 3 [place] Suicide Severity Risk Score: Flowsheet Row Admission (Current) from 03/14/2024 in [facility] INPATIENT BEHAVIORAL MEDICINE Most recent reading at 03/14/2024  3:00 PM ED from 03/12/2024 in  [place] [facility] at [facility] Most recent reading at 03/12/2024 11:07 PM ED from 03/12/2024 in [facility] Most recent reading at 03/12/2024  2:15 PM  C-SSRS RISK CATEGORY No Risk No Risk No Risk       Last PHQ 2/9 Scores:    03/08/2023    10:52 AM 11/07/2022    2:03 PM  Depression screen PHQ 2/9  Decreased Interest 0 0  Down, Depressed, Hopeless 0 0  PHQ - 2 Score 0 0    Scribe for Treatment Team: Gaylan Gerold 03/16/2024 3:38 PM
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
## 2024-03-16 NOTE — Group Note (Signed)
 Date:  03/16/2024 Time:  5:49 PM  Group Topic/Focus:  Goals Group:   The focus of this group is to help patients establish daily goals to achieve during treatment and discuss how the patient can incorporate goal setting into their daily lives to aide in recovery.    Participation Level:  Did Not Attend   [name] [facility] 03/16/2024, 5:49 PM

## 2024-03-16 NOTE — Group Note (Signed)
 Recreation Therapy Group Note   Group Topic:Coping Skills  Group Date: 03/16/2024 Start Time: 1000 End Time: 1055 Facilitators: Rosina Lowenstein, LRT, CTRS Location:  Craft Room  Group Description: Coping A-Z. LRT and patients engage in a guided discussion on what coping skills are and gave specific examples. LRT passed out a handout labeled Coping A-Z with blank spaces beside each letter. LRT prompted patients to come up with a coping skill for each of the letters. LRT and patients went over the handout and gave ideas for each letter if anyone had any blanks left on their paper. Patients kept this handout with them that listed 26 different coping skills.   Goal Area(s) Addressed: Patients will be able to define "coping skills". Patient will identify new coping skills.  Patient will increase communication.   Affect/Mood: Appropriate   Participation Level: Active and Engaged   Participation Quality: Independent   Behavior: Appropriate, Calm, and Cooperative   Speech/Thought Process: Coherent   Insight: Good   Judgement: Good   Modes of Intervention: Clarification, Education, Guided Discussion, Socialization, Worksheet, and Writing   Patient Response to Interventions:  Attentive, Engaged, Interested , and Receptive   Education Outcome:  Acknowledges education   Clinical Observations/Individualized Feedback: [name] was active in their participation of session activities and group discussion. Pt identified "exercise, doodling, and sports" as coping skills. Pt was able to identify 26 different coping skills, one for each letter. Pt received candy after group as a reward. Pt spontaneously contributed to group discussion while present. Pt interacted well with LRT and peers duration of session.    Plan: Continue to engage patient in RT group sessions 2-3x/week.   Rosina Lowenstein, LRT, CTRS 03/16/2024 1:32 PM

## 2024-03-16 NOTE — Progress Notes (Signed)
   03/15/24 2000  Psych Admission Type (Psych Patients Only)  Admission Status Voluntary  Psychosocial Assessment  Patient Complaints None  Eye Contact Brief  Facial Expression Flat  Affect Blunted;Sad  Speech Logical/coherent  Interaction Minimal  Motor Activity Slow  Appearance/Hygiene Body odor;Disheveled;Poor hygiene  Behavior Characteristics Cooperative;Appropriate to situation  Mood Pleasant  Thought Process  Coherency WDL  Content WDL  Delusions WDL  Perception WDL  Hallucination None reported or observed  Judgment WDL  Confusion WDL  Danger to Self  Current suicidal ideation? Denies  Danger to Others  Danger to Others None reported or observed    Patient alert and oriented x 4, he denies SI/HI/AVH, affect is congruent, thoughts are organized, no distress noted, drsg intact too patient's right hand, patient looks disheveled with a body odor noted,emotional support offered. 15 minutes safety checks monitored.
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
## 2024-03-16 NOTE — Consult Note (Signed)
 NAME: Mark Galloway  DOB: [birth year]  MRN: [id number]  Date/Time: 03/16/2024 10:44 AM  REQUESTING PROVIDER: Judeth Cornfield tingling Subjective:  REASON FOR CONSULT: : AIDS/ syphilis ? Mark Galloway is a 38 y.o. with a history of Bipolar disorder, AIDS, treated syphilis , followed at [facility] for AIDS was admitted to [facility] for depression/anxiety and homelessness  Recently was in 02/21/24-02/24/24 admitted for abscess finger I/D  MRSA and took bactrim Followed by Betha Loa  Pt has been treated for syphilis with 3 doses of Benzathine penicillin on 3/8, 3/15 and 3/22 That time RPR 1:32 HE then left [place] and went to [place]. Was hospitalized in [facility] in Aug 2024 and RPR then was 1:256- This was a new infection and he received one dose of Benzathine P 2.4 Mu Then on Jan 05, 2024 he was back in the hospital for diarrhea and RPR then was 1;128, appropriate response Pt has been taking Biktarvy for 3 months straight now HE was non complaint before. Says he is homeless  HE is doing well other wise No headache, blurred vision No Weakness     Past Medical History:  Diagnosis Date   Bipolar 1 disorder ([name])    HIV infection ([name])    Schizophrenia ([name])    Shingles     Past Surgical History:  Procedure Laterality Date   I & D EXTREMITY Right 02/22/2024   Procedure: IRRIGATION AND DEBRIDEMENT EXTREMITY;  Surgeon: Betha Loa, MD;  Location: [facility] OR;  Service: Orthopedics;  Laterality: Right;   IRRIGATION AND DEBRIDEMENT FOOT Left     Social History   Socioeconomic History   Marital status: Single    Spouse name: Not on file   Number of children: 0   Years of education: 16   Highest education level: Not on file  Occupational History   Occupation: Flight Service  Tobacco Use   Smoking status: Some Days    Types: Cigars, Cigarettes   Smokeless tobacco: Never   Tobacco comments:    2 per week  Vaping Use   Vaping status: Never Used  Substance and Sexual Activity   Alcohol use: Yes     Comment: 3-4 per week   Drug use: Never   Sexual activity: Not Currently    Comment: accepted condoms  Other Topics Concern   Not on file  Social History Narrative   Not on file   Social Drivers of Health   Financial Resource Strain: High Risk (01/04/2024)   Received from [facility]   Overall Financial Resource Strain (CARDIA)    Difficulty of Paying Living Expenses: Very hard  Food Insecurity: Food Insecurity Present (03/14/2024)   Hunger Vital Sign    Worried About Running Out of Food in the Last Year: Sometimes true    Ran Out of Food in the Last Year: Sometimes true  Transportation Needs: No Transportation Needs (03/14/2024)   PRAPARE - Administrator, Civil Service (Medical): No    Lack of Transportation (Non-Medical): No  Recent Concern: Transportation Needs - Unmet Transportation Needs (02/23/2024)   PRAPARE - Transportation    Lack of Transportation (Medical): Yes    Lack of Transportation (Non-Medical): Yes  Physical Activity: Not on file  Stress: Not on file  Social Connections: Unknown (05/04/2022)   [facility] from [facility]   Social Network    Social Network: Not on file  Intimate Partner Violence: Not At Risk (03/14/2024)   Humiliation, Afraid, Rape, and Kick questionnaire  Fear of Current or Ex-Partner: No    Emotionally Abused: No    Physically Abused: No    Sexually Abused: No    History reviewed. No pertinent family history. Allergies  Allergen Reactions   Peanut Oil Hives   Peanut-Containing Drug Products    I? Current Facility-Administered Medications  Medication Dose Route Frequency Provider Last Rate Last Admin   alum & mag hydroxide-simeth (MAALOX/MYLANTA) 200-200-20 MG/5ML suspension 30 mL  30 mL Oral Q4H PRN Tingling, Stephanie, PA-C       ascorbic acid (VITAMIN C) tablet 1,000 mg  1,000 mg Oral Daily Tingling, Stephanie, PA-C   1,000 mg at 03/16/24 4034   bictegravir-emtricitabine-tenofovir AF (BIKTARVY)  50-200-25 MG per tablet 1 tablet  1 tablet Oral Daily Tingling, Stephanie, PA-C   1 tablet at 03/16/24 7425   haloperidol (HALDOL) tablet 5 mg  5 mg Oral TID PRN Tingling, Stephanie, PA-C       And   diphenhydrAMINE (BENADRYL) capsule 50 mg  50 mg Oral TID PRN Tingling, Stephanie, PA-C       haloperidol lactate (HALDOL) injection 5 mg  5 mg Intramuscular TID PRN Tingling, Stephanie, PA-C       And   diphenhydrAMINE (BENADRYL) injection 50 mg  50 mg Intramuscular TID PRN Tingling, Stephanie, PA-C       And   LORazepam (ATIVAN) injection 2 mg  2 mg Intramuscular TID PRN Tingling, Stephanie, PA-C       haloperidol lactate (HALDOL) injection 10 mg  10 mg Intramuscular TID PRN Tingling, Stephanie, PA-C       And   diphenhydrAMINE (BENADRYL) injection 50 mg  50 mg Intramuscular TID PRN Tingling, Stephanie, PA-C       And   LORazepam (ATIVAN) injection 2 mg  2 mg Intramuscular TID PRN Tingling, Stephanie, PA-C       docusate sodium (COLACE) capsule 100 mg  100 mg Oral BID Tingling, Stephanie, PA-C   100 mg at 03/16/24 9563   hydrOXYzine (ATARAX) tablet 25 mg  25 mg Oral TID PRN Tingling, Stephanie, PA-C   25 mg at 03/15/24 2207   ibuprofen (ADVIL) tablet 800 mg  800 mg Oral Q6H PRN Tingling, Stephanie, PA-C       magnesium hydroxide (MILK OF MAGNESIA) suspension 30 mL  30 mL Oral Daily PRN Tingling, Stephanie, PA-C       melatonin tablet 5 mg  5 mg Oral QHS Tingling, Stephanie, PA-C   5 mg at 03/15/24 2207   nystatin (MYCOSTATIN) 100000 UNIT/ML suspension 500,000 Units  5 mL Oral QID Tingling, Stephanie, PA-C   500,000 Units at 03/16/24 0835   OLANZapine zydis (ZYPREXA) disintegrating tablet 5 mg  5 mg Oral BID Tingling, Stephanie, PA-C   5 mg at 03/16/24 8756     Abtx:  Anti-infectives (From admission, onward)    Start     Dose/Rate Route Frequency Ordered Stop   03/15/24 0800  bictegravir-emtricitabine-tenofovir AF (BIKTARVY) 50-200-25 MG per tablet 1 tablet        1 tablet Oral Daily 03/14/24  1441         REVIEW OF SYSTEMS:  Const: negative fever, negative chills, negative weight loss Eyes: negative diplopia or visual changes, negative eye pain ENT: negative coryza, negative sore throat Resp: negative cough, hemoptysis, dyspnea Cards: negative for chest pain, palpitations, lower extremity edema GU: negative for frequency, dysuria and hematuria GI: Negative for abdominal pain, diarrhea, bleeding, constipation Skin: negative for rash and pruritus Heme: negative for easy  bruising and gum/nose bleeding MS: rt hand - little finger surgical dressing Neurolo:negative for headaches, dizziness, vertigo, memory problems  Psych:as above  Endocrine: negative for thyroid, diabetes Allergy/Immunology- negative for any medication ?  Objective:  VITALS:  BP 120/72 (BP Location: Right Arm)   Pulse 82   Temp 98.5 F (36.9 C)   Resp 18   Ht 5\' 9"  (1.753 m)   Wt 72.1 kg   SpO2 99%   BMI 23.48 kg/m   PHYSICAL EXAM:  General: Alert, cooperative, no distress, appears stated age.  Head: Normocephalic, without obvious abnormality, atraumatic. Eyes: Conjunctivae clear, anicteric sclerae. Pupils are equal ENT Nares normal. No drainage or sinus tenderness. Lips, mucosa, and tongue normal. No Thrush Neck: Supple, symmetrical, no adenopathy, thyroid: non tender no carotid bruit and no JVD. Back: No CVA tenderness. Lungs: Clear to auscultation bilaterally. No Wheezing or Rhonchi. No rales. Heart: Regular rate and rhythm, no murmur, rub or gallop. Abdomen: Soft, non-tender,not distended. Bowel sounds normal. No masses Extremities: atraumatic, no cyanosis. No edema. No clubbing Skin: No rashes or lesions. Or bruising Lymph: Cervical, supraclavicular normal. Neurologic: Grossly non-focal Pertinent Labs Lab Results CBC    Component Value Date/Time   WBC 4.3 03/14/2024 0905   RBC 3.52 (L) 03/14/2024 0905   HGB 11.4 (L) 03/14/2024 0905   HGB 11.6 (L) 04/02/2020 0622   HCT 35.1 (L)  03/14/2024 0905   HCT 35.6 (L) 04/02/2020 0622   PLT 313 03/14/2024 0905   PLT 183 04/02/2020 0622   MCV 99.7 03/14/2024 0905   MCV 99 (H) 04/02/2020 0622   MCH 32.4 03/14/2024 0905   MCHC 32.5 03/14/2024 0905   RDW 14.7 03/14/2024 0905   RDW 12.3 04/02/2020 0622   LYMPHSABS 1.2 03/14/2024 0905   LYMPHSABS 1.4 04/02/2020 0622   MONOABS 0.5 03/14/2024 0905   EOSABS 0.4 03/14/2024 0905   EOSABS 0.1 04/02/2020 0622   BASOSABS 0.0 03/14/2024 0905   BASOSABS 0.0 04/02/2020 0622       Latest Ref Rng & Units 03/14/2024    9:05 AM 02/24/2024    6:56 AM 02/23/2024    6:47 AM  CMP  Glucose 70 - 99 mg/dL 846  962  952   BUN 6 - 20 mg/dL 10  6  13    Creatinine 0.61 - 1.24 mg/dL 8.41  3.24  4.01   Sodium 135 - 145 mmol/L 133  137  136   Potassium 3.5 - 5.1 mmol/L 4.4  3.7  4.0   Chloride 98 - 111 mmol/L 102  106  106   CO2 22 - 32 mmol/L 21  24  23    Calcium 8.9 - 10.3 mg/dL 9.0  8.9  9.4   Total Protein 6.5 - 8.1 g/dL 7.6     Total Bilirubin 0.0 - 1.2 mg/dL <0.2     Alkaline Phos 38 - 126 U/L 70     AST 15 - 41 U/L 23     ALT 0 - 44 U/L 12       RPR 1: 32 Cd4 is 163 ( 12.5%) HIV RNA pending  IMAGING RESULTS: I have personally reviewed the films ? 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 Appropriate response now- it is 1;32 No further treatment or  testing needed now Can check in 3 months at His HIV provider's office  Bipolar disorder Schizophrenia  After discharge patient will follow up with PA [name] for HIV in [place]   Discussed the management with the patient- Informed his HIV provider  ________________________________________________ Discussed with requesting provider Note:  This document was prepared using Dragon  voice recognition software and may include unintentional dictation errors.

## 2024-03-16 NOTE — Group Note (Signed)
 [facility] LCSW Group Therapy Note    Group Date: 03/16/2024 Start Time: 1300 End Time: 1400  Type of Therapy and Topic:  Group Therapy:  Overcoming Obstacles  Participation Level:  BHH PARTICIPATION LEVEL: Did Not Attend   Description of Group:   In this group patients will be encouraged to explore what they see as obstacles to their own wellness and recovery. They will be guided to discuss their thoughts, feelings, and behaviors related to these obstacles. The group will process together ways to cope with barriers, with attention given to specific choices patients can make. Each patient will be challenged to identify changes they are motivated to make in order to overcome their obstacles. This group will be process-oriented, with patients participating in exploration of their own experiences as well as giving and receiving support and challenge from other group members.  Therapeutic Goals: 1. Patient will identify personal and current obstacles as they relate to admission. 2. Patient will identify barriers that currently interfere with their wellness or overcoming obstacles.  3. Patient will identify feelings, thought process and behaviors related to these barriers. 4. Patient will identify two changes they are willing to make to overcome these obstacles:    Summary of Patient Progress Patient did not attend.    Therapeutic Modalities:   Cognitive Behavioral Therapy Solution Focused Therapy Motivational Interviewing Relapse Prevention Therapy   Glenis Smoker, LCSW

## 2024-03-16 NOTE — Progress Notes (Addendum)
 Pt pleasant cooperative and engaged in unit milieu therapy. Pt denied having SI, plan or intent and is compliant with medication regimen. Pt is managed on q 15 min rounds.    03/16/24 0835  Psych Admission Type (Psych Patients Only)  Admission Status Voluntary  Psychosocial Assessment  Patient Complaints None  Eye Contact Brief  Facial Expression Flat  Affect Flat  Speech Logical/coherent  Interaction Minimal  Motor Activity Slow  Appearance/Hygiene Body odor;Disheveled  Behavior Characteristics Cooperative  Mood Pleasant  Thought Process  Coherency WDL  Content WDL  Delusions None reported or observed  Perception WDL  Hallucination None reported or observed  Judgment WDL  Confusion WDL  Danger to Self  Current suicidal ideation? Denies  Danger to Others  Danger to Others None reported or observed

## 2024-03-16 NOTE — BHH Suicide Risk Assessment (Signed)
 BHH INPATIENT:  Family/Significant Other Suicide Prevention Education  Suicide Prevention Education:  Education Completed; Marquette Saa, mother, [phone] has been identified by the patient as the family member/significant other with whom the patient will be residing, and identified as the person(s) who will aid the patient in the event of a mental health crisis (suicidal ideations/suicide attempt).  With written consent from the patient, the family member/significant other has been provided the following suicide prevention education, prior to the and/or following the discharge of the patient.  The suicide prevention education provided includes the following: Suicide risk factors Suicide prevention and interventions National Suicide Hotline telephone number [facility] assessment telephone number [facility] Emergency Assistance 911 [facility] and/or Residential Mobile Crisis Unit telephone number  Request made of family/significant other to: Remove weapons (e.g., guns, rifles, knives), all items previously/currently identified as safety concern.   Remove drugs/medications (over-the-counter, prescriptions, illicit drugs), all items previously/currently identified as a safety concern.  The family member/significant other verbalizes understanding of the suicide prevention education information provided.  The family member/significant other agrees to remove the items of safety concern listed above.  Harden Mo 03/16/2024, 1:32 PM

## 2024-03-17 ENCOUNTER — Inpatient Hospital Stay: Payer: Self-pay

## 2024-03-17 DIAGNOSIS — B20 Human immunodeficiency virus [HIV] disease: Secondary | ICD-10-CM | POA: Diagnosis not present

## 2024-03-17 DIAGNOSIS — F319 Bipolar disorder, unspecified: Secondary | ICD-10-CM | POA: Diagnosis not present

## 2024-03-17 DIAGNOSIS — E871 Hypo-osmolality and hyponatremia: Secondary | ICD-10-CM | POA: Diagnosis not present

## 2024-03-17 DIAGNOSIS — T8149XA Infection following a procedure, other surgical site, initial encounter: Principal | ICD-10-CM

## 2024-03-17 LAB — T.PALLIDUM AB, TOTAL: T Pallidum Abs: REACTIVE — AB

## 2024-03-17 MED ORDER — DOXYCYCLINE HYCLATE 100 MG PO TABS: 100.0000 mg | ORAL_TABLET | Freq: Two times a day (BID) | ORAL | Status: DC

## 2024-03-17 MED ORDER — SULFAMETHOXAZOLE-TRIMETHOPRIM 800-160 MG PO TABS
1.0000 | ORAL_TABLET | Freq: Two times a day (BID) | ORAL | Status: DC
  Administered 2024-03-17 – 2024-03-20 (×7): 1 via ORAL
  Filled 2024-03-17 (×7): qty 1

## 2024-03-17 MED ORDER — PROPRANOLOL HCL 20 MG PO TABS
10.0000 mg | ORAL_TABLET | Freq: Two times a day (BID) | ORAL | Status: DC
  Administered 2024-03-18 – 2024-03-31 (×27): 10 mg via ORAL
  Filled 2024-03-17 (×27): qty 1

## 2024-03-17 MED ORDER — AMOXICILLIN-POT CLAVULANATE 875-125 MG PO TABS: 1.0000 | ORAL_TABLET | Freq: Two times a day (BID) | ORAL | Status: DC

## 2024-03-17 NOTE — [facility] Group Notes (Signed)
 [facility] Group Notes:  (Nursing/MHT/Case Management/Adjunct)  Date:  03/17/2024  Time:  2:28 AM  Type of Therapy:  Psychoeducational Skills  Participation Level:  Did Not Attend  Participation Quality:  Resistant  Affect:  Resistant  Cognitive:  Lacking  Insight:  None  Engagement in Group:  None  Modes of Intervention:  Education  Summary of Progress/Problems: The patient did not attend group last evening.   Mikaia Janvier S 03/17/2024, 2:28 AM

## 2024-03-17 NOTE — Group Note (Signed)
 Date:  03/17/2024 Time:  11:13 PM  Group Topic/Focus:  Wrap-Up Group:   The focus of this group is to help patients review their daily goal of treatment and discuss progress on daily workbooks.    Participation Level:  Active  Participation Quality:  Appropriate  Affect:  Appropriate  Cognitive:  Appropriate  Insight: Appropriate  Engagement in Group:  Engaged  Modes of Intervention:  Discussion    Mark Galloway 03/17/2024, 11:13 PM

## 2024-03-17 NOTE — Assessment & Plan Note (Addendum)
 Continue Biktarvy.  Last CD4 count 146.

## 2024-03-17 NOTE — Group Note (Signed)
 Recreation Therapy Group Note   Group Topic:Healthy Support Systems  Group Date: 03/17/2024 Start Time: 1000 End Time: 1055 Facilitators: Rosina Lowenstein, LRT, CTRS Location: Craft Room  Group Description: Straw Bridge. Individually, patients were given 10 plastic drinking straws and an equal length of masking tape. Using the materials provided, patients were instructed to build a free-standing bridge-like structure to suspend an everyday item (ex: puzzle box) off the floor or table surface. All materials were required to be used in Secondary school teacher. LRT facilitated post-activity discussion reviewing how we, humans, are like the structure we built; when things get too heavy in our life and we do not have adequate supports/coping skills, then we will fall just like the straw-built structure will. LRT focused on how having a "base" or structure on the bottom was necessary for the object to stand, meaning we must be secure and stable first before building on ourselves or others. Patients were encouraged to name 2 healthy supports in their life and reflect on how the skills used in this activity can be generalized to daily life post discharge.  Goal Area(s) Addressed:  Patient will identify two healthy support systems in their life. Patient will work on Product manager. Patient will verbalize the importance of having a strong and steady "base".  Patient will follow multi-step directions. Patients will engage in creativity and use all provided materials.   Affect/Mood: N/A   Participation Level: Did not attend    Clinical Observations/Individualized Feedback: Patient did not attend group.   Plan: Continue to engage patient in RT group sessions 2-3x/week.   Rosina Lowenstein, LRT, CTRS 03/17/2024 12:51 PM

## 2024-03-17 NOTE — Group Note (Unsigned)
 Date:  03/17/2024 Time:  10:55 PM  Group Topic/Focus:  Wrap-Up Group:   The focus of this group is to help patients review their daily goal of treatment and discuss progress on daily workbooks.     Participation Level:  {BHH PARTICIPATION ZOXWR:60454}  Participation Quality:  {B[facility] PARTICIPATION QUALITY:22265}  Affect:  {B[facility] AFFECT:22266}  Cognitive:  {[facility] COGNITIVE:22267}  Insight: {BHH Insight2:20797}  Engagement in Group:  {BHH ENGAGEMENT IN UJWJX:91478}  Modes of Intervention:  {B[facility] MODES OF INTERVENTION:22269}  Additional Comments:  ***  [name] 03/17/2024, 10:55 PM

## 2024-03-17 NOTE — Plan of Care (Signed)
  Problem: Safety: Goal: Periods of time without injury will increase Outcome: Progressing   Problem: Physical Regulation: Goal: Ability to maintain clinical measurements within normal limits will improve Outcome: Progressing   Problem: Health Behavior/Discharge Planning: Goal: Identification of resources available to assist in meeting health care needs will improve Outcome: Progressing   Problem: Coping: Goal: Ability to verbalize frustrations and anger appropriately will improve Outcome: Progressing   Problem: Education: Goal: Mental status will improve Outcome: Progressing   Problem: Education: Goal: Emotional status will improve Outcome: Progressing

## 2024-03-17 NOTE — Plan of Care (Signed)
   Problem: Education: Goal: Emotional status will improve Outcome: Progressing Goal: Mental status will improve Outcome: Progressing

## 2024-03-17 NOTE — Progress Notes (Signed)
 Pleasant and cooperative, Denies SI, HI, AVH. Med compliant. No complaints voiced. Encouragement and support provided. Safety checks maintained. Meds given as prescribed. Pt remains safe on unit with q 15 min checks.

## 2024-03-17 NOTE — Assessment & Plan Note (Addendum)
 Case discussed with Dr. Burna Mortimer PA Earney Hamburg and recommended local wound care and follow-up as an outpatient.  I will get a wound culture and start Bactrim DS.  Will check labs in the morning.  Patient's wound closing by secondary intention.

## 2024-03-17 NOTE — Assessment & Plan Note (Deleted)
 Continue Biktarvy.  Last CD4 count 146.

## 2024-03-17 NOTE — Assessment & Plan Note (Signed)
 Treatment as per psychiatry team.

## 2024-03-17 NOTE — Consult Note (Signed)
 Initial Consultation Note   Patient: Mark Galloway IHK:[id number] DOB: July 21, 1986 PCP: Pcp, No DOA: 03/14/2024 DOS: the patient was seen and examined on 03/17/2024 Primary service: Verner Chol, MD  Referring physician: Dr. Gardiner Ramus Reason for consult: Hand infection.  Assessment/Plan: Assessment and Plan: * Wound infection after surgery Case discussed with Dr. Burna Mortimer PA Earney Hamburg and recommended local wound care and follow-up as an outpatient.  I will get a wound culture and start Bactrim DS.  Will check labs in the morning.  Patient's wound closing by secondary intention.  Bipolar 1 disorder ([name]) Treatment as per psychiatry team.  AIDS ([name]) Continue Biktarvy.  Last CD4 count 146.  Hyponatremia Sodium 1 point less than normal range.       TRH will continue to follow the patient.  HPI: Mark Galloway is a 38 y.o. male with past medical history of HIV on Biktarvy he was admitted to [facility] on 2/22 with hand pain and swelling and blistering.  He was taken to the operating room by Dr. Merlyn Lot.  He was taken to the operating room for a right small finger hand abscess and had incision and drainage of the right small finger including flexor sheath and incision and drainage of right hand collar-button abscess.  Patient was given antibiotics.  MRSA grew out of culture.  Patient was sent over to [facility] for bipolar disorder.  Medical team was consulted on 3/18 for hand infection.  Patient not complaining of any worsening pain in his hand.  Wound closing by secondary intention.  Patient having some drainage from the hand.  I took a picture of the hand infection and spoke with Dr. Merrilee Seashore PA Earney Hamburg and mentions local wound care and follow-up as outpatient.  Continue dressings here in the hospital.  Will start Bactrim.  Review of Systems: Review of Systems  Constitutional:  Positive for malaise/fatigue. Negative for chills, fever  and weight loss.  HENT:  Negative for hearing loss and sore throat.   Eyes:  Negative for blurred vision.  Respiratory:  Positive for shortness of breath. Negative for cough.   Cardiovascular:  Negative for chest pain.  Gastrointestinal:  Negative for abdominal pain, constipation, diarrhea, nausea and vomiting.  Musculoskeletal:  Negative for joint pain.  Skin:  Negative for rash.  Neurological:  Negative for dizziness.  Endo/Heme/Allergies:  Does not bruise/bleed easily.  Psychiatric/Behavioral:  The patient is nervous/anxious.     Past Medical History:  Diagnosis Date   Bipolar 1 disorder ([name])    HIV infection ([name])    Schizophrenia ([name])    Shingles    Past Surgical History:  Procedure Laterality Date   I & D EXTREMITY Right 02/22/2024   Procedure: IRRIGATION AND DEBRIDEMENT EXTREMITY;  Surgeon: Betha Loa, MD;  Location: [facility] OR;  Service: Orthopedics;  Laterality: Right;   IRRIGATION AND DEBRIDEMENT FOOT Left    Social History:  reports that he has been smoking cigars and cigarettes. He has never used smokeless tobacco. He reports current alcohol use. He reports that he does not use drugs.  Allergies  Allergen Reactions   Peanut Oil Hives   Peanut-Containing Drug Products     History reviewed. No pertinent family history.  Prior to Admission medications   Medication Sig Start Date End Date Taking? Authorizing Provider  bictegravir-emtricitabine-tenofovir AF (BIKTARVY) 50-200-25 MG TABS tablet Take 1 tablet by mouth daily. 02/24/24  Yes Pokhrel, Laxman, MD  ascorbic acid (VITAMIN C) 1000 MG tablet Take 1 tablet (  1,000 mg total) by mouth daily. Patient not taking: Reported on 03/14/2024 02/24/24   Joycelyn Das, MD  sulfamethoxazole-trimethoprim (BACTRIM DS) 800-160 MG tablet Take 1 tablet by mouth 2 (two) times daily. Patient not taking: Reported on 03/14/2024 02/24/24   Joycelyn Das, MD   Currently in the hospital on vitamin C, Biktarvy, Colace, Haldol, Benadryl,  Ativan, Atarax, Advil, melatonin, nystatin swish and swallow, Zyprexa Physical Exam: Vitals:   03/15/24 1711 03/16/24 0650 03/16/24 1702 03/17/24 0627  BP: 112/79 120/72 (!) 130/113 132/87  Pulse: 94 82 93 91  Resp:    (!) 21  Temp: 98.2 F (36.8 C) 98.5 F (36.9 C) 97.6 F (36.4 C) 97.9 F (36.6 C)  TempSrc:      SpO2: 100% 99% 100% 100%  Weight:      Height:       Physical Exam HENT:     Head: Normocephalic.     Mouth/Throat:     Pharynx: No oropharyngeal exudate.  Eyes:     General: Lids are normal.     Conjunctiva/sclera: Conjunctivae normal.  Cardiovascular:     Rate and Rhythm: Normal rate and regular rhythm.     Heart sounds: Normal heart sounds, S1 normal and S2 normal.  Pulmonary:     Breath sounds: No decreased breath sounds, wheezing, rhonchi or rales.  Abdominal:     Palpations: Abdomen is soft.     Tenderness: There is no abdominal tenderness.  Musculoskeletal:     Right lower leg: No swelling.     Left lower leg: No swelling.  Neurological:     Mental Status: He is alert.     Comments: Answers questions appropriately.     Data Reviewed:  Previous culture MRSA Last sodium 133, creatinine 0.7, white blood count 3.3, platelet count 317, hemoglobin 11.9   Primary team communication: Spoke with PA. Thank you very much for involving [place] in the care of your patient.  Author: Alford Highland, MD 03/17/2024 3:13 PM  For on call review [email or web address].

## 2024-03-17 NOTE — Progress Notes (Signed)
   03/17/24 1200  Psych Admission Type (Psych Patients Only)  Admission Status Voluntary  Psychosocial Assessment  Patient Complaints None  Eye Contact Brief  Facial Expression Other (Comment) (WNL)  Affect Appropriate to circumstance  Speech Logical/coherent  Interaction Assertive  Motor Activity Slow  Appearance/Hygiene In scrubs  Behavior Characteristics Cooperative;Appropriate to situation  Mood Pleasant  Thought Process  Coherency WDL  Content WDL  Delusions None reported or observed  Perception WDL  Hallucination None reported or observed  Judgment Impaired  Confusion WDL  Danger to Self  Current suicidal ideation? Denies  Danger to Others  Danger to Others None reported or observed

## 2024-03-17 NOTE — Progress Notes (Addendum)
   03/16/24 2300  Psych Admission Type (Psych Patients Only)  Admission Status Voluntary  Psychosocial Assessment  Patient Complaints Worrying;Anxiety  Eye Contact Brief  Facial Expression Flat  Affect Anxious  Speech Logical/coherent  Interaction Minimal  Motor Activity Slow  Appearance/Hygiene Body odor;In scrubs  Behavior Characteristics Cooperative  Mood Pleasant  Thought Process  Coherency WDL  Content WDL  Delusions None reported or observed  Perception WDL  Hallucination None reported or observed  Judgment WDL  Confusion WDL  Danger to Self  Current suicidal ideation? Denies (Denies)  Danger to Others  Danger to Others None reported or observed   At 2000, Arnette Felts and I  cleaned and re-dressed surgical wound to right hand. NP Coralee North was available to assess. Wound gaping with foul odor and bloody drainage. There is a order to change dressing every 24 hours.

## 2024-03-17 NOTE — Group Note (Signed)
 [facility] LCSW Group Therapy Note   Group Date: 03/17/2024 Start Time: 1300 End Time: 1400   Type of Therapy and Topic: Group Therapy: Avoiding Self-Sabotaging and Enabling Behaviors  Participation Level: Active  Mood: Calm  Description of Group:  In this group, patients will learn how to identify obstacles, self-sabotaging and enabling behaviors, as well as: what are they, why do we do them and what needs these behaviors meet. Discuss unhealthy relationships and how to have positive healthy boundaries with those that sabotage and enable. Explore aspects of self-sabotage and enabling in yourself and how to limit these self-destructive behaviors in everyday life.   Therapeutic Goals: 1. Patient will identify one obstacle that relates to self-sabotage and enabling behaviors 2. Patient will identify one personal self-sabotaging or enabling behavior they did prior to admission 3. Patient will state a plan to change the above identified behavior 4. Patient will demonstrate ability to communicate their needs through discussion and/or role play.    Summary of Patient Progress:   Patient actively participated in group, sharing personal experiences in navigating complex situations while demonstrating self-restraint, awareness, and effective communication. They were engaged, receptive to feedback, and contributed to fostering a supportive environment that encouraged peers to share and learn.    Therapeutic Modalities:  Cognitive Behavioral Therapy Person-Centered Therapy Motivational Interviewing    [name], LCSW

## 2024-03-17 NOTE — Plan of Care (Signed)
  Problem: Education: Goal: Mental status will improve Outcome: Progressing Goal: Verbalization of understanding the information provided will improve Outcome: Progressing   Problem: Safety: Goal: Periods of time without injury will increase Outcome: Progressing   

## 2024-03-17 NOTE — Group Note (Signed)
 Date:  03/17/2024 Time:  10:12 AM  Group Topic/Focus:  Goals Group:   The focus of this group is to help patients establish daily goals to achieve during treatment and discuss how the patient can incorporate goal setting into their daily lives to aide in recovery.    Participation Level:  Did Not Attend   [name] [facility] 03/17/2024, 10:12 AM

## 2024-03-17 NOTE — Assessment & Plan Note (Signed)
Sodium 1 point less than normal range.

## 2024-03-17 NOTE — Consult Note (Signed)
 WOC contacted by email 3/17 9:07 PM with images for worsening finger wound on this patient.  I have advised [facility] staff to add images to the patient's chart and contact Dr. Karlyn Agee the hand surgeon involved in the patient's care. The current status of the wound is outside of the scope of practice of the WOC nursing team. Will not change topical care at this time. Using hydrogel to attempt to keep exposed tendon moist, hoping to maintain function.   Made entire team aware of this also by secure chart this am, not able to add K. Kuzma to the [name]. Listed as offline.   Requested nursing in the unit to contact him directly. They will also follow up with their providers this am.   [facility], CNS, [employer] [phone]

## 2024-03-17 NOTE — Progress Notes (Signed)
 [name] MD Progress Note  03/17/2024 10:19 PM Mark Galloway  MRN:  [id number] Subjective:  38 year old African American male admitted for psychiatric stabilization. He has been observed interacting with staff and actively attending group sessions, demonstrating engagement in treatment. During the assessment, the patient denied any pain or discomfort related to his right hand, 5th digit wound. The Clinical research associate notified the medical hospitalist, orthopedic hand surgeon, and physician assistant (PA) regarding the patient's wound care needs to ensure appropriate medical follow-up and intervention. Principal Problem: Wound infection after surgery Diagnosis: Principal Problem:   Wound infection after surgery Active Problems:   Bipolar 1 disorder ([name])   AIDS ([name])   Cellulitis   Paranoid schizophrenia ([name])   Hyponatremia  Total Time spent with patient: 3 hours  Past Psychiatric History: see below  Past Medical History:  Past Medical History:  Diagnosis Date   Bipolar 1 disorder ([name])    HIV infection ([name])    Schizophrenia ([name])    Shingles     Past Surgical History:  Procedure Laterality Date   I & D EXTREMITY Right 02/22/2024   Procedure: IRRIGATION AND DEBRIDEMENT EXTREMITY;  Surgeon: Betha Loa, MD;  Location: [facility] OR;  Service: Orthopedics;  Laterality: Right;   IRRIGATION AND DEBRIDEMENT FOOT Left    Family History: History reviewed. No pertinent family history. Family Psychiatric  History: none reported Social History:  Social History   Substance and Sexual Activity  Alcohol Use Yes   Comment: 3-4 per week     Social History   Substance and Sexual Activity  Drug Use Never    Social History   Socioeconomic History   Marital status: Single    Spouse name: Not on file   Number of children: 0   Years of education: 16   Highest education level: Not on file  Occupational History   Occupation: Public relations account executive  Tobacco Use   Smoking status: Some Days    Types: Cigars,  Cigarettes   Smokeless tobacco: Never   Tobacco comments:    2 per week  Vaping Use   Vaping status: Never Used  Substance and Sexual Activity   Alcohol use: Yes    Comment: 3-4 per week   Drug use: Never   Sexual activity: Not Currently    Comment: accepted condoms  Other Topics Concern   Not on file  Social History Narrative   Not on file   Social Drivers of Health   Financial Resource Strain: High Risk (01/04/2024)   Received from [facility]   Overall Financial Resource Strain (CARDIA)    Difficulty of Paying Living Expenses: Very hard  Food Insecurity: Food Insecurity Present (03/14/2024)   Hunger Vital Sign    Worried About Running Out of Food in the Last Year: Sometimes true    Ran Out of Food in the Last Year: Sometimes true  Transportation Needs: No Transportation Needs (03/14/2024)   PRAPARE - Administrator, Civil Service (Medical): No    Lack of Transportation (Non-Medical): No  Recent Concern: Transportation Needs - Unmet Transportation Needs (02/23/2024)   PRAPARE - Administrator, Civil Service (Medical): Yes    Lack of Transportation (Non-Medical): Yes  Physical Activity: Not on file  Stress: Not on file  Social Connections: Unknown (05/04/2022)   Received from [facility]   Social Network    Social Network: Not on file   Additional Social History:  Sleep: Good  Appetite:  Good  Current Medications: Current Facility-Administered Medications  Medication Dose Route Frequency Provider Last Rate Last Admin   alum & mag hydroxide-simeth (MAALOX/MYLANTA) 200-200-20 MG/5ML suspension 30 mL  30 mL Oral Q4H PRN Tingling, Stephanie, PA-C       ascorbic acid (VITAMIN C) tablet 1,000 mg  1,000 mg Oral Daily Tingling, Stephanie, PA-C   1,000 mg at 03/17/24 0902   bictegravir-emtricitabine-tenofovir AF (BIKTARVY) 50-200-25 MG per tablet 1 tablet  1 tablet Oral Daily Tingling, Stephanie,  PA-C   1 tablet at 03/17/24 3664   haloperidol (HALDOL) tablet 5 mg  5 mg Oral TID PRN Tingling, Judeth Cornfield, PA-C       And   diphenhydrAMINE (BENADRYL) capsule 50 mg  50 mg Oral TID PRN Tingling, Stephanie, PA-C   50 mg at 03/16/24 2205   haloperidol lactate (HALDOL) injection 5 mg  5 mg Intramuscular TID PRN Tingling, Stephanie, PA-C       And   diphenhydrAMINE (BENADRYL) injection 50 mg  50 mg Intramuscular TID PRN Tingling, Stephanie, PA-C       And   LORazepam (ATIVAN) injection 2 mg  2 mg Intramuscular TID PRN Tingling, Stephanie, PA-C       haloperidol lactate (HALDOL) injection 10 mg  10 mg Intramuscular TID PRN Tingling, Stephanie, PA-C       And   diphenhydrAMINE (BENADRYL) injection 50 mg  50 mg Intramuscular TID PRN Tingling, Stephanie, PA-C       And   LORazepam (ATIVAN) injection 2 mg  2 mg Intramuscular TID PRN Tingling, Stephanie, PA-C       docusate sodium (COLACE) capsule 100 mg  100 mg Oral BID Tingling, Stephanie, PA-C   100 mg at 03/17/24 1748   hydrOXYzine (ATARAX) tablet 25 mg  25 mg Oral TID PRN Tingling, Stephanie, PA-C   25 mg at 03/15/24 2207   ibuprofen (ADVIL) tablet 800 mg  800 mg Oral Q6H PRN Tingling, Stephanie, PA-C   800 mg at 03/17/24 2121   magnesium hydroxide (MILK OF MAGNESIA) suspension 30 mL  30 mL Oral Daily PRN Tingling, Stephanie, PA-C       melatonin tablet 5 mg  5 mg Oral QHS Tingling, Stephanie, PA-C   5 mg at 03/17/24 2119   nystatin (MYCOSTATIN) 100000 UNIT/ML suspension 500,000 Units  5 mL Oral QID Tingling, Stephanie, PA-C   500,000 Units at 03/17/24 2119   OLANZapine zydis (ZYPREXA) disintegrating tablet 5 mg  5 mg Oral BID Tingling, Stephanie, PA-C   5 mg at 03/17/24 2119   propranolol (INDERAL) tablet 10 mg  10 mg Oral BID Myriam Forehand, NP       sulfamethoxazole-trimethoprim (BACTRIM DS) 800-160 MG per tablet 1 tablet  1 tablet Oral Q12H Alford Highland, MD   1 tablet at 03/17/24 2119    Lab Results:  Results for orders placed or  performed during the hospital encounter of 03/14/24 (from the past 48 hours)  Aerobic/Anaerobic Culture w Gram Stain (surgical/deep wound)     Status: None (Preliminary result)   Collection Time: 03/17/24 11:08 AM   Specimen: Wound  Result Value Ref Range   Specimen Description      WOUND Performed at [facility], [address] [place], [place] [address]    Special Requests      [facility] Performed at [facility], [address] [place], [place] [address]    Gram Stain      FEW WBC PRESENT, PREDOMINANTLY  PMN FEW GRAM POSITIVE COCCI IN PAIRS FEW GRAM NEGATIVE RODS Performed at [facility], [address], [place], [place] [address]    Culture PENDING    Report Status PENDING     Blood Alcohol level:  Lab Results  Component Value Date   ETH <10 10/01/2022   ETH <10 03/25/2020    Metabolic Disorder Labs: Lab Results  Component Value Date   HGBA1C 5.1 03/15/2024   MPG 99.67 03/15/2024   No results found for: "PROLACTIN" Lab Results  Component Value Date   CHOL 108 03/15/2024   TRIG 106 03/15/2024   HDL 39 (L) 03/15/2024   CHOLHDL 2.8 03/15/2024   VLDL 21 03/15/2024   LDLCALC 48 03/15/2024   LDLCALC 40 11/07/2022    Physical Findings: AIMS:  , ,  ,  ,    CIWA:    COWS:     Musculoskeletal: Strength & Muscle Tone: within normal limits Gait & Station: normal Patient leans: N/A  Psychiatric Specialty Exam:  Presentation  General Appearance:  Fairly Groomed  Eye Contact: Good  Speech: Clear and Coherent; Normal Rate  Speech Volume: Normal  Handedness: Right   Mood and Affect  Mood: Anxious  Affect: Flat; Appropriate   Thought Process  Thought Processes: Goal Directed; Coherent  Descriptions of Associations:Intact  Orientation:Full (Time, Place and Person)  Thought Content:Tangential  History of Schizophrenia/Schizoaffective disorder:No  Duration of Psychotic Symptoms:Greater than six  months  Hallucinations:Hallucinations: None Description of Command Hallucinations: DENIES  Ideas of Reference:None  Suicidal Thoughts:Suicidal Thoughts: No  Homicidal Thoughts:Homicidal Thoughts: No HI Passive Intent and/or Plan: -- (DENIES)   Sensorium  Memory: Immediate Good; Recent Good; Remote Good  Judgment: Fair  Insight: Fair   Art therapist  Concentration: Fair  Attention Span: Fair  Recall: Good  Fund of Knowledge: Good  Language: Good   Psychomotor Activity  Psychomotor Activity: Psychomotor Activity: Increased   Assets  Assets: Communication Skills; Resilience   Sleep  Sleep: Sleep: Fair Number of Hours of Sleep: 6    Physical Exam: Physical Exam Vitals and nursing note reviewed.  Constitutional:      Appearance: Normal appearance.  HENT:     Head: Normocephalic and atraumatic.     Nose: Nose normal.  Pulmonary:     Effort: Pulmonary effort is normal.  Musculoskeletal:     Cervical back: Normal range of motion.     Comments: right hand, 5th digit wound f  Neurological:     General: No focal deficit present.     Mental Status: He is alert and oriented to person, place, and time. Mental status is at baseline.  Psychiatric:        Attention and Perception: Attention normal.        Mood and Affect: Mood is anxious. Affect is flat.        Speech: Speech normal.        Behavior: Behavior normal. Behavior is cooperative.        Thought Content: Thought content normal.        Cognition and Memory: Cognition and memory normal.        Judgment: Judgment is impulsive.    Review of Systems  Skin:        right hand, 5th digit wound f  Psychiatric/Behavioral:  Positive for substance abuse. The patient is nervous/anxious.   All other systems reviewed and are negative.  Blood pressure 132/87, pulse 91, temperature 97.9 F (36.6 C), resp. rate (!) 21, height  5\' 9"  (1.753 m), weight 72.1 kg, SpO2 100%. Body mass index is 23.48  kg/m.   Treatment Plan Summary: Daily contact with patient to assess and evaluate symptoms and progress in treatment and Medication management Sulfamethoxazole-Trimethoprim (Bactrim DS) 800-160 mg - 1 tablet twice daily for wound infection prophylaxis/treatment. Inderal (Propranolol) 10 mg - Twice daily for anxiety, tremors, or cardiovascular support Continue Zyprexa 5 mg BID for mood stabilization and psychosis management. Continue Hydroxyzine 25 mg TID PRN for anxiety. Start Melatonin 5 mg HS for sleep aid.  Myriam Forehand, NP 03/17/2024, 10:19 PM

## 2024-03-18 DIAGNOSIS — T8149XA Infection following a procedure, other surgical site, initial encounter: Secondary | ICD-10-CM | POA: Diagnosis not present

## 2024-03-18 DIAGNOSIS — F200 Paranoid schizophrenia: Secondary | ICD-10-CM

## 2024-03-18 DIAGNOSIS — F319 Bipolar disorder, unspecified: Secondary | ICD-10-CM | POA: Diagnosis not present

## 2024-03-18 DIAGNOSIS — E871 Hypo-osmolality and hyponatremia: Secondary | ICD-10-CM | POA: Diagnosis not present

## 2024-03-18 LAB — COMPREHENSIVE METABOLIC PANEL
ALT: 16 U/L (ref 0–44)
AST: 22 U/L (ref 15–41)
Albumin: 3.4 g/dL — ABNORMAL LOW (ref 3.5–5.0)
Alkaline Phosphatase: 71 U/L (ref 38–126)
Anion gap: 6 (ref 5–15)
BUN: 13 mg/dL (ref 6–20)
CO2: 24 mmol/L (ref 22–32)
Calcium: 9.3 mg/dL (ref 8.9–10.3)
Chloride: 105 mmol/L (ref 98–111)
Creatinine, Ser: 0.84 mg/dL (ref 0.61–1.24)
GFR, Estimated: >60 mL/min (ref >60)
Glucose, Bld: 108 mg/dL — ABNORMAL HIGH (ref 70–99)
Potassium: 4.3 mmol/L (ref 3.5–5.1)
Sodium: 135 mmol/L (ref 135–145)
Total Bilirubin: 0.4 mg/dL (ref 0.0–1.2)
Total Protein: 8.3 g/dL — ABNORMAL HIGH (ref 6.5–8.1)

## 2024-03-18 LAB — CBC WITH DIFFERENTIAL/PLATELET
Abs Immature Granulocytes: 0.01 10*3/uL (ref 0.00–0.07)
Basophils Absolute: 0 10*3/uL (ref 0.0–0.1)
Basophils Relative: 1 %
Eosinophils Absolute: 0.4 10*3/uL (ref 0.0–0.5)
Eosinophils Relative: 9 %
HCT: 34.7 % — ABNORMAL LOW (ref 39.0–52.0)
Hemoglobin: 11.7 g/dL — ABNORMAL LOW (ref 13.0–17.0)
Immature Granulocytes: 0 %
Lymphocytes Relative: 52 %
Lymphs Abs: 1.9 10*3/uL (ref 0.7–4.0)
MCH: 32.5 pg (ref 26.0–34.0)
MCHC: 33.7 g/dL (ref 30.0–36.0)
MCV: 96.4 fL (ref 80.0–100.0)
Monocytes Absolute: 0.5 10*3/uL (ref 0.1–1.0)
Monocytes Relative: 12 %
Neutro Abs: 1 10*3/uL — ABNORMAL LOW (ref 1.7–7.7)
Neutrophils Relative %: 26 %
Platelets: 301 10*3/uL (ref 150–400)
RBC: 3.6 MIL/uL — ABNORMAL LOW (ref 4.22–5.81)
RDW: 14.4 % (ref 11.5–15.5)
WBC: 3.7 10*3/uL — ABNORMAL LOW (ref 4.0–10.5)
nRBC: 0 % (ref 0.0–0.2)

## 2024-03-18 LAB — SEDIMENTATION RATE: Sed Rate: 70 mm/h — ABNORMAL HIGH (ref 0–16)

## 2024-03-18 NOTE — Group Note (Signed)
 Date:  03/18/2024 Time:  9:57 PM  Group Topic/Focus:  Personal Choices and Values:   The focus of this group is to help patients assess and explore the importance of values in their lives, how their values affect their decisions, how they express their values and what opposes their expression.    Participation Level:  Did Not Attend  Participation Quality:   none  Affect:   none  Cognitive:   none  Insight: None  Engagement in Group:   none  Modes of Intervention:   none  Additional Comments:  none   Belva Crome 03/18/2024, 9:57 PM

## 2024-03-18 NOTE — [facility] Counselor (Signed)
 [place] home list given to patient.   CSW team to continue to assess.    Reymundo Poll, MSW, LCSWA 03/18/2024 11:30 AM

## 2024-03-18 NOTE — Plan of Care (Signed)
   Problem: Education: Goal: Emotional status will improve Outcome: Progressing Goal: Mental status will improve Outcome: Progressing   Problem: Activity: Goal: Sleeping patterns will improve Outcome: Progressing   Problem: Coping: Goal: Ability to verbalize frustrations and anger appropriately will improve Outcome: Progressing

## 2024-03-18 NOTE — Plan of Care (Signed)
   Problem: Education: Goal: Emotional status will improve Outcome: Progressing Goal: Mental status will improve Outcome: Progressing Goal: Verbalization of understanding the information provided will improve Outcome: Progressing

## 2024-03-18 NOTE — Group Note (Signed)
 [facility] LCSW Group Therapy Note   Group Date: 03/18/2024 Start Time: 1300 End Time: 1400   Type of Therapy/Topic:  Group Therapy:  Emotion Regulation  Participation Level:  Did Not Attend   Mood:  Description of Group:    The purpose of this group is to assist patients in learning to regulate negative emotions and experience positive emotions. Patients will be guided to discuss ways in which they have been vulnerable to their negative emotions. These vulnerabilities will be juxtaposed with experiences of positive emotions or situations, and patients challenged to use positive emotions to combat negative ones. Special emphasis will be placed on coping with negative emotions in conflict situations, and patients will process healthy conflict resolution skills.  Therapeutic Goals: Patient will identify two positive emotions or experiences to reflect on in order to balance out negative emotions:  Patient will label two or more emotions that they find the most difficult to experience:  Patient will be able to demonstrate positive conflict resolution skills through discussion or role plays:   Summary of Patient Progress:   Patient declined to attend group.     Therapeutic Modalities:   Cognitive Behavioral Therapy Feelings Identification Dialectical Behavioral Therapy   Harden Mo, LCSW

## 2024-03-18 NOTE — Progress Notes (Signed)
 Patient noted in dayroom and on phone throughout early shift. Denies SI, HI, AVh. Dressing to hand clean, dry and intact. Voiced no concerns or complaints. Encouragement and support provided. Safety checks maintained. Medications given as prescribed. Pt receptive and remains safe on unit with q 15 min checks.

## 2024-03-18 NOTE — Group Note (Signed)
 Date:  03/18/2024 Time:  5:09 PM  Group Topic/Focus:  Activity Group: The focus of the group is to promote activity for the patients and encourage them to go outside to the courtyard for some fresh air and exercise.    Participation Level:  Did Not Attend   Mary Sella Joss Mcdill 03/18/2024, 5:09 PM

## 2024-03-18 NOTE — Progress Notes (Signed)
 Progress Note   Patient: Mark Galloway [id number] DOB: 1986/03/28 DOA: 03/14/2024     4 DOS: the patient was seen and examined on 03/18/2024   Brief hospital course: Mark Galloway is a 37 y.o. male with past medical history of HIV on Biktarvy he was admitted to [facility] on 2/22 with hand pain and swelling and blistering.  He was taken to the operating room by Dr. Merlyn Lot.  He was taken to the operating room for a right small finger hand abscess and had incision and drainage of the right small finger including flexor sheath and incision and drainage of right hand collar-button abscess.  Patient was given antibiotics.  MRSA grew out of culture.  Patient was sent over to [facility] for bipolar disorder.   Assessment and Plan: Right hand wound infection Status post I&D Patient will be continued on Bactrim therapy. He will need local wound care and dressing changes. Patient will follow hand surgery as outpatient as scheduled. Contact hand surgery Dr. Merlyn Lot office for follow-up upon discharge.  Bipolar disorder Management as per psychiatry team.  HIV AIDS On Biktarvy therapy.  Hyponatremia Resolved.  Encourage oral hydration.  Hospitalist team will sign off.  Please call [facility] service for any questions or Concerns.           Out of bed to chair. Incentive spirometry. Nursing supportive care. Fall, aspiration precautions. Diet:  Diet Orders (From admission, onward)     Start     Ordered   03/14/24 1442  Diet regular Room service appropriate? Yes; Fluid consistency: Thin  Diet effective now       Question Answer Comment  Room service appropriate? Yes   Fluid consistency: Thin      03/14/24 1441           Level of care: inpatient psych   Code Status: Full Code  Subjective: Patient is seen and examined today morning in psych unit 323A. He is lying comfortably. Denies any pain. Right hand dressing noted.  Physical Exam: Vitals:    03/16/24 0650 03/16/24 1702 03/17/24 0627 03/18/24 0609  BP: 120/72 (!) 130/113 132/87 128/85  Pulse: 82 93 91 86  Resp:   (!) 21   Temp: 98.5 F (36.9 C) 97.6 F (36.4 C) 97.9 F (36.6 C) 97.6 F (36.4 C)  TempSrc:      SpO2: 99% 100% 100% 100%  Weight:      Height:        General - Young  African American male, no apparent distress HEENT - PERRLA, EOMI, atraumatic head, non tender sinuses. Lung - Clear, no rales, rhonchi, wheezes. Heart - S1, S2 heard, no murmurs, rubs, no pedal edema. Abdomen - Soft, non tender, bowel sounds good Neuro - Alert, awake and oriented x 3, non focal exam. Skin - Warm and dry.  Data Reviewed:      Latest Ref Rng & Units 03/18/2024    6:41 AM 03/15/2024    2:25 PM 03/14/2024    9:05 AM  CBC  WBC 4.0 - 10.5 K/uL 3.7  3.3  4.3   Hemoglobin 13.0 - 17.0 g/dL 27.2  53.6  64.4   Hematocrit 39.0 - 52.0 % 34.7  36.5  35.1   Platelets 150 - 400 K/uL 301  317  313       Latest Ref Rng & Units 03/18/2024    6:41 AM 03/14/2024    9:05 AM 02/24/2024    6:56 AM  BMP  Glucose 70 - 99 mg/dL 295  621  308   BUN 6 - 20 mg/dL 13  10  6    Creatinine 0.61 - 1.24 mg/dL 6.57  8.46  9.62   Sodium 135 - 145 mmol/L 135  133  137   Potassium 3.5 - 5.1 mmol/L 4.3  4.4  3.7   Chloride 98 - 111 mmol/L 105  102  106   CO2 22 - 32 mmol/L 24  21  24    Calcium 8.9 - 10.3 mg/dL 9.3  9.0  8.9    DG Hand Complete Right Result Date: 03/17/2024 CLINICAL DATA:  [id number] Open wound of finger with tendon injury, initial encounter [id number] EXAM: RIGHT HAND - COMPLETE 3+ VIEW COMPARISON:  None Available. FINDINGS: Marked soft tissue thickening of the fifth digit. Mild skin irregularity with overlying dressing in place. Question of decreased density involving the radial aspect of the middle phalanx versus artifact. No acute fracture. The remainder the hand is intact. No radiopaque foreign body. IMPRESSION: Marked soft tissue thickening of the fifth digit with overlying dressing in place.  Question of decreased density involving the radial aspect of the middle phalanx versus artifact. This could represent early osteomyelitis. Electronically Signed   By: Narda Rutherford M.D.   On: 03/17/2024 16:35    Communication: Discussed with patient, psychiatry team regarding the plan. All questions answered.  Disposition: Per primary team  Planned Discharge Destination:  as per inpatient psychiatry team     Time spent: 40 minutes  Author: Marcelino Duster, MD 03/18/2024 5:57 PM Secure chat 7am to 7pm For on call review [email or web address].

## 2024-03-18 NOTE — Progress Notes (Deleted)
 Progress Note   Patient: Mark Galloway ZOX:[id number] DOB: 1986/12/07 DOA: 03/12/2024     0 DOS: the patient was seen and examined on 03/18/2024   Brief hospital course: Fleetwood Pierron is a 38 y.o. male with past medical history of HIV on Biktarvy he was admitted to [facility] on 2/22 with hand pain and swelling and blistering.  He was taken to the operating room by Dr. Merlyn Lot.  He was taken to the operating room for a right small finger hand abscess and had incision and drainage of the right small finger including flexor sheath and incision and drainage of right hand collar-button abscess.  Patient was given antibiotics.  MRSA grew out of culture.  Patient was sent over to [facility] for bipolar disorder.   Assessment and Plan: Right hand wound infection Status post I&D Patient will be continued on Bactrim therapy. He will need local wound care and dressing changes. Patient will follow hand surgery as outpatient as scheduled. Contact hand surgery Dr. Merlyn Lot office for follow-up upon discharge.  Bipolar disorder Management as per psychiatry team.  HIV AIDS On Biktarvy therapy.  Hyponatremia Resolved.  Encourage oral hydration.  Hospitalist team will sign off.  Please call [facility] service for any questions or Concerns.           Out of bed to chair. Incentive spirometry. Nursing supportive care. Fall, aspiration precautions. Diet:  Diet Orders (From admission, onward)    None      Level of care: inpatient psych   Code Status: Full Code  Subjective: Patient is seen and examined today morning in psych unit 323A. He is lying comfortably. Denies any pain. Right hand dressing noted.  Physical Exam: Vitals:   03/14/24 1300 03/14/24 1307 03/14/24 1328 03/14/24 1347  BP: 134/68 130/74 134/68 134/68  Pulse: 82 84 82 82  Resp: 18 17 17 17   Temp: 98.2 F (36.8 C) 98 F (36.7 C) 98.2 F (36.8 C) 98.2 F (36.8 C)  TempSrc:  Oral    SpO2: 99%  100% 99% 99%  Weight:      Height:        General - Young  African American male, no apparent distress HEENT - PERRLA, EOMI, atraumatic head, non tender sinuses. Lung - Clear, no rales, rhonchi, wheezes. Heart - S1, S2 heard, no murmurs, rubs, no pedal edema. Abdomen - Soft, non tender, bowel sounds good Neuro - Alert, awake and oriented x 3, non focal exam. Skin - Warm and dry.  Data Reviewed:      Latest Ref Rng & Units 03/18/2024    6:41 AM 03/15/2024    2:25 PM 03/14/2024    9:05 AM  CBC  WBC 4.0 - 10.5 K/uL 3.7  3.3  4.3   Hemoglobin 13.0 - 17.0 g/dL 81.1  91.4  78.2   Hematocrit 39.0 - 52.0 % 34.7  36.5  35.1   Platelets 150 - 400 K/uL 301  317  313       Latest Ref Rng & Units 03/18/2024    6:41 AM 03/14/2024    9:05 AM 02/24/2024    6:56 AM  BMP  Glucose 70 - 99 mg/dL 956  213  086   BUN 6 - 20 mg/dL 13  10  6    Creatinine 0.61 - 1.24 mg/dL 5.78  4.69  6.29   Sodium 135 - 145 mmol/L 135  133  137   Potassium 3.5 - 5.1 mmol/L 4.3  4.4  3.7   Chloride 98 - 111 mmol/L 105  102  106   CO2 22 - 32 mmol/L 24  21  24    Calcium 8.9 - 10.3 mg/dL 9.3  9.0  8.9    DG Hand Complete Right Result Date: 03/17/2024 CLINICAL DATA:  [id number] Open wound of finger with tendon injury, initial encounter [id number] EXAM: RIGHT HAND - COMPLETE 3+ VIEW COMPARISON:  None Available. FINDINGS: Marked soft tissue thickening of the fifth digit. Mild skin irregularity with overlying dressing in place. Question of decreased density involving the radial aspect of the middle phalanx versus artifact. No acute fracture. The remainder the hand is intact. No radiopaque foreign body. IMPRESSION: Marked soft tissue thickening of the fifth digit with overlying dressing in place. Question of decreased density involving the radial aspect of the middle phalanx versus artifact. This could represent early osteomyelitis. Electronically Signed   By: Narda Rutherford M.D.   On: 03/17/2024 16:35    Communication: Discussed  with patient, psychiatry team regarding the plan. All questions answered.  Disposition: Per primary team  Planned Discharge Destination:  as per inpatient psychiatry team     Time spent: 40 minutes  Author: Marcelino Duster, MD 03/18/2024 2:58 PM Secure chat 7am to 7pm For on call review [email or web address].

## 2024-03-18 NOTE — Progress Notes (Signed)
 [facility] 2 MD Progress Note  03/18/2024 8:30 PM Mark Galloway  MRN:  [id number] Subjective:  38 year old African American male,expressing homicidal ideation (HI), stating, "I am homicidal, not you, but I will hurt someone." The patient is currently under the care of an orthopedic hand surgeon and is being monitored due to concerns regarding his prognosis following hand surgery. He expresses significant anxiety, sadness, and frustration related to his medical condition.Due to escalating distress, homicidal ideation, and difficulty coping with his current medical prognosis, the patient was admitted for psychiatric stabilization, safety monitoring, and crisis intervention. Principal Problem: Wound infection after surgery Diagnosis: Principal Problem:   Wound infection after surgery Active Problems:   Bipolar 1 disorder ([name])   AIDS ([name])   Cellulitis   Paranoid schizophrenia ([name])   Hyponatremia  Total Time spent with patient: 1.5 hours  Past Psychiatric History: see below  Past Medical History:  Past Medical History:  Diagnosis Date   Bipolar 1 disorder ([name])    HIV infection ([name])    Schizophrenia ([name])    Shingles     Past Surgical History:  Procedure Laterality Date   I & D EXTREMITY Right 02/22/2024   Procedure: IRRIGATION AND DEBRIDEMENT EXTREMITY;  Surgeon: Betha Loa, MD;  Location: [facility] OR;  Service: Orthopedics;  Laterality: Right;   IRRIGATION AND DEBRIDEMENT FOOT Left    Family History: History reviewed. No pertinent family history. Family Psychiatric  History: none reported Social History:  Social History   Substance and Sexual Activity  Alcohol Use Yes   Comment: 3-4 per week     Social History   Substance and Sexual Activity  Drug Use Never    Social History   Socioeconomic History   Marital status: Single    Spouse name: Not on file   Number of children: 0   Years of education: 16   Highest education level: Not on file  Occupational History   Occupation:  Public relations account executive  Tobacco Use   Smoking status: Some Days    Types: Cigars, Cigarettes   Smokeless tobacco: Never   Tobacco comments:    2 per week  Vaping Use   Vaping status: Never Used  Substance and Sexual Activity   Alcohol use: Yes    Comment: 3-4 per week   Drug use: Never   Sexual activity: Not Currently    Comment: accepted condoms  Other Topics Concern   Not on file  Social History Narrative   Not on file   Social Drivers of Health   Financial Resource Strain: High Risk (01/04/2024)   Received from [facility]   Overall Financial Resource Strain (CARDIA)    Difficulty of Paying Living Expenses: Very hard  Food Insecurity: Food Insecurity Present (03/14/2024)   Hunger Vital Sign    Worried About Running Out of Food in the Last Year: Sometimes true    Ran Out of Food in the Last Year: Sometimes true  Transportation Needs: No Transportation Needs (03/14/2024)   PRAPARE - Administrator, Civil Service (Medical): No    Lack of Transportation (Non-Medical): No  Recent Concern: Transportation Needs - Unmet Transportation Needs (02/23/2024)   PRAPARE - Transportation    Lack of Transportation (Medical): Yes    Lack of Transportation (Non-Medical): Yes  Physical Activity: Not on file  Stress: Not on file  Social Connections: Unknown (05/04/2022)   Received from [facility]   Social Network    Social Network: Not on file   Additional  Social History:                         Sleep: Fair  Appetite:  Good  Current Medications: Current Facility-Administered Medications  Medication Dose Route Frequency Provider Last Rate Last Admin   alum & mag hydroxide-simeth (MAALOX/MYLANTA) 200-200-20 MG/5ML suspension 30 mL  30 mL Oral Q4H PRN Tingling, Stephanie, PA-C       ascorbic acid (VITAMIN C) tablet 1,000 mg  1,000 mg Oral Daily Tingling, Stephanie, PA-C   1,000 mg at 03/18/24 0901   bictegravir-emtricitabine-tenofovir AF (BIKTARVY)  50-200-25 MG per tablet 1 tablet  1 tablet Oral Daily Tingling, Stephanie, PA-C   1 tablet at 03/18/24 0901   haloperidol (HALDOL) tablet 5 mg  5 mg Oral TID PRN Tingling, Judeth Cornfield, PA-C       And   diphenhydrAMINE (BENADRYL) capsule 50 mg  50 mg Oral TID PRN Tingling, Stephanie, PA-C   50 mg at 03/16/24 2205   haloperidol lactate (HALDOL) injection 5 mg  5 mg Intramuscular TID PRN Tingling, Stephanie, PA-C       And   diphenhydrAMINE (BENADRYL) injection 50 mg  50 mg Intramuscular TID PRN Tingling, Stephanie, PA-C       And   LORazepam (ATIVAN) injection 2 mg  2 mg Intramuscular TID PRN Tingling, Stephanie, PA-C       haloperidol lactate (HALDOL) injection 10 mg  10 mg Intramuscular TID PRN Tingling, Stephanie, PA-C       And   diphenhydrAMINE (BENADRYL) injection 50 mg  50 mg Intramuscular TID PRN Tingling, Stephanie, PA-C       And   LORazepam (ATIVAN) injection 2 mg  2 mg Intramuscular TID PRN Tingling, Stephanie, PA-C       docusate sodium (COLACE) capsule 100 mg  100 mg Oral BID Tingling, Stephanie, PA-C   100 mg at 03/18/24 1750   hydrOXYzine (ATARAX) tablet 25 mg  25 mg Oral TID PRN Tingling, Stephanie, PA-C   25 mg at 03/18/24 0903   ibuprofen (ADVIL) tablet 800 mg  800 mg Oral Q6H PRN Tingling, Stephanie, PA-C   800 mg at 03/18/24 1753   magnesium hydroxide (MILK OF MAGNESIA) suspension 30 mL  30 mL Oral Daily PRN Tingling, Stephanie, PA-C       melatonin tablet 5 mg  5 mg Oral QHS Tingling, Stephanie, PA-C   5 mg at 03/17/24 2119   nystatin (MYCOSTATIN) 100000 UNIT/ML suspension 500,000 Units  5 mL Oral QID Tingling, Stephanie, PA-C   500,000 Units at 03/18/24 1751   OLANZapine zydis (ZYPREXA) disintegrating tablet 5 mg  5 mg Oral BID Tingling, Stephanie, PA-C   5 mg at 03/18/24 0901   propranolol (INDERAL) tablet 10 mg  10 mg Oral BID Myriam Forehand, NP   10 mg at 03/18/24 1751   sulfamethoxazole-trimethoprim (BACTRIM DS) 800-160 MG per tablet 1 tablet  1 tablet Oral Q12H Wieting,  Richard, MD   1 tablet at 03/18/24 0901    Lab Results:  Results for orders placed or performed during the hospital encounter of 03/14/24 (from the past 48 hours)  Aerobic/Anaerobic Culture w Gram Stain (surgical/deep wound)     Status: None (Preliminary result)   Collection Time: 03/17/24 11:08 AM   Specimen: Wound  Result Value Ref Range   Specimen Description      WOUND Performed at [facility], [address], [place], [place] [address]    Special Requests  RH Performed at [facility], [address] [place], [place] [address]    Gram Stain      FEW WBC PRESENT, PREDOMINANTLY PMN FEW GRAM POSITIVE COCCI IN PAIRS FEW GRAM NEGATIVE RODS    Culture      MODERATE STAPHYLOCOCCUS AUREUS RARE GRAM NEGATIVE RODS CULTURE REINCUBATED FOR BETTER GROWTH Performed at [facility], [address], [place], [place] [address]    Report Status PENDING   Comprehensive metabolic panel     Status: Abnormal   Collection Time: 03/18/24  6:41 AM  Result Value Ref Range   Sodium 135 135 - 145 mmol/L   Potassium 4.3 3.5 - 5.1 mmol/L   Chloride 105 98 - 111 mmol/L   CO2 24 22 - 32 mmol/L   Glucose, Bld 108 (H) 70 - 99 mg/dL    Comment: Glucose reference range applies only to samples taken after fasting for at least 8 hours.   BUN 13 6 - 20 mg/dL   Creatinine, Ser 4.40 0.61 - 1.24 mg/dL   Calcium 9.3 8.9 - 10.2 mg/dL   Total Protein 8.3 (H) 6.5 - 8.1 g/dL   Albumin 3.4 (L) 3.5 - 5.0 g/dL   AST 22 15 - 41 U/L   ALT 16 0 - 44 U/L   Alkaline Phosphatase 71 38 - 126 U/L   Total Bilirubin 0.4 0.0 - 1.2 mg/dL   GFR, Estimated >72 >53 mL/min    Comment: (NOTE) Calculated using the CKD-EPI Creatinine Equation (2021)    Anion gap 6 5 - 15    Comment: Performed at [facility], [address] [place], [place] [address]  Sedimentation rate     Status: Abnormal   Collection Time: 03/18/24  6:41 AM  Result Value Ref Range   Sed Rate 70 (H) 0 - 16 mm/hr     Comment: Performed at [facility], [address] [place], [place] [address]  CBC with Differential/Platelet     Status: Abnormal   Collection Time: 03/18/24  6:41 AM  Result Value Ref Range   WBC 3.7 (L) 4.0 - 10.5 K/uL   RBC 3.60 (L) 4.22 - 5.81 MIL/uL   Hemoglobin 11.7 (L) 13.0 - 17.0 g/dL   HCT 59.5 (L) 63.8 - 75.6 %   MCV 96.4 80.0 - 100.0 fL   MCH 32.5 26.0 - 34.0 pg   MCHC 33.7 30.0 - 36.0 g/dL   RDW 43.3 29.5 - 18.8 %   Platelets 301 150 - 400 K/uL   nRBC 0.0 0.0 - 0.2 %   Neutrophils Relative % 26 %   Neutro Abs 1.0 (L) 1.7 - 7.7 K/uL   Lymphocytes Relative 52 %   Lymphs Abs 1.9 0.7 - 4.0 K/uL   Monocytes Relative 12 %   Monocytes Absolute 0.5 0.1 - 1.0 K/uL   Eosinophils Relative 9 %   Eosinophils Absolute 0.4 0.0 - 0.5 K/uL   Basophils Relative 1 %   Basophils Absolute 0.0 0.0 - 0.1 K/uL   Immature Granulocytes 0 %   Abs Immature Granulocytes 0.01 0.00 - 0.07 K/uL    Comment: Performed at [facility], [address] [place], [place] [address]    Blood Alcohol level:  Lab Results  Component Value Date   [facility] <10 10/01/2022   ETH <10 03/25/2020    Metabolic Disorder Labs: Lab Results  Component Value Date   HGBA1C 5.1 03/15/2024   MPG 99.67 03/15/2024   No results found  for: "PROLACTIN" Lab Results  Component Value Date   CHOL 108 03/15/2024   TRIG 106 03/15/2024   HDL 39 (L) 03/15/2024   CHOLHDL 2.8 03/15/2024   VLDL 21 03/15/2024   LDLCALC 48 03/15/2024   LDLCALC 40 11/07/2022    Physical Findings: AIMS:  , ,  ,  ,    CIWA:    COWS:     Musculoskeletal: Strength & Muscle Tone: within normal limits Gait & Station: normal Patient leans: N/A  Psychiatric Specialty Exam:  Presentation  General Appearance:  Fairly Groomed  Eye Contact: Good  Speech: Clear and Coherent; Normal Rate  Speech Volume: Normal  Handedness: Right   Mood and Affect  Mood: Anxious  Affect: Flat; Appropriate   Thought  Process  Thought Processes: Goal Directed; Coherent  Descriptions of Associations:Intact  Orientation:Full (Time, Place and Person)  Thought Content:Tangential  History of Schizophrenia/Schizoaffective disorder:No  Duration of Psychotic Symptoms:Greater than six months  Hallucinations:Hallucinations: None Description of Command Hallucinations: DENIES  Ideas of Reference:None  Suicidal Thoughts:Suicidal Thoughts: No  Homicidal Thoughts:Homicidal Thoughts: No HI Passive Intent and/or Plan: -- (DENIES)   Sensorium  Memory: Immediate Good; Recent Good; Remote Good  Judgment: Fair  Insight: Fair   Art therapist  Concentration: Fair  Attention Span: Fair  Recall: Good  Fund of Knowledge: Good  Language: Good   Psychomotor Activity  Psychomotor Activity: Psychomotor Activity: Increased   Assets  Assets: Communication Skills; Resilience   Sleep  Sleep: Sleep: Fair Number of Hours of Sleep: 6    Physical Exam: Physical Exam Vitals reviewed.  Constitutional:      Appearance: Normal appearance.  HENT:     Head: Normocephalic and atraumatic.     Nose: Nose normal.  Pulmonary:     Effort: Pulmonary effort is normal.  Musculoskeletal:        General: Normal range of motion.     Cervical back: Normal range of motion.     Comments: Right hand  Neurological:     General: No focal deficit present.     Mental Status: He is alert and oriented to person, place, and time. Mental status is at baseline.  Psychiatric:        Attention and Perception: Attention and perception normal.        Mood and Affect: Mood is anxious and depressed.        Speech: Speech normal.        Behavior: Behavior normal. Behavior is cooperative.        Thought Content: Thought content includes homicidal ideation. Thought content includes homicidal plan.        Cognition and Memory: Cognition and memory normal.        Judgment: Judgment is impulsive.    Review  of Systems  Skin:        Right hand with dressing  Psychiatric/Behavioral:  Positive for depression and substance abuse. The patient is nervous/anxious.   All other systems reviewed and are negative.  Blood pressure 128/85, pulse 86, temperature 97.6 F (36.4 C), resp. rate (!) 21, height 5\' 9"  (1.753 m), weight 72.1 kg, SpO2 100%. Body mass index is 23.48 kg/m.   Treatment Plan Summary: Daily contact with patient to assess and evaluate symptoms and progress in treatment and Medication management Sulfamethoxazole-Trimethoprim (Bactrim DS) 800-160 mg - 1 tablet twice daily for wound infection prophylaxis/treatment. Inderal (Propranolol) 10 mg - Twice daily for anxiety, tremors, or cardiovascular support Continue Zyprexa 5 mg BID for mood stabilization and  psychosis management. Continue Hydroxyzine 25 mg TID PRN for anxiety. Start Melatonin 5 mg HS for sleep aid.  Myriam Forehand, NP 03/18/2024, 8:30 PM

## 2024-03-19 DIAGNOSIS — F319 Bipolar disorder, unspecified: Secondary | ICD-10-CM | POA: Diagnosis not present

## 2024-03-19 NOTE — Progress Notes (Signed)
 [facility] MD Progress Note  03/19/2024 3:52 PM Mark Galloway  MRN:  [id number] Subjective:   38 year old African American male remains angry and disengaged during his inpatient stay. He continues to express significant frustration and distress regarding his hand injury and ongoing medical prognosis, stating that he is focused on "saving my finger." The patient remains preoccupied with his medical condition and has minimal engagement in therapeutic discussions or group activities.His mood remains anxious and irritable, and he displays limited insight into his emotional regulation. While he has not expressed active homicidal ideation (HI) today, he remains on safety monitoring due to prior statements of intent to harm others.The treatment team continues to provide supportive interventions and crisis counseling to address his anger, frustration, and coping difficulties. Coordination with the orthopedic team is ongoing to provide clarification on prognosis and next steps for surgical intervention. Principal Problem: Wound infection after surgery Diagnosis: Principal Problem:   Wound infection after surgery Active Problems:   Bipolar 1 disorder ([name])   AIDS ([name])   Paranoid schizophrenia ([name])  Total Time spent with patient: 2 hours  Past Psychiatric History: see below  Past Medical History:  Past Medical History:  Diagnosis Date   Bipolar 1 disorder ([name])    HIV infection ([name])    Schizophrenia ([name])    Shingles     Past Surgical History:  Procedure Laterality Date   I & D EXTREMITY Right 02/22/2024   Procedure: IRRIGATION AND DEBRIDEMENT EXTREMITY;  Surgeon: Betha Loa, MD;  Location: [facility] OR;  Service: Orthopedics;  Laterality: Right;   IRRIGATION AND DEBRIDEMENT FOOT Left    Family History: History reviewed. No pertinent family history. Family Psychiatric  History: none reported Social History:  Social History   Substance and Sexual Activity  Alcohol Use Yes   Comment: 3-4 per week      Social History   Substance and Sexual Activity  Drug Use Never    Social History   Socioeconomic History   Marital status: Single    Spouse name: Not on file   Number of children: 0   Years of education: 16   Highest education level: Not on file  Occupational History   Occupation: Public relations account executive  Tobacco Use   Smoking status: Some Days    Types: Cigars, Cigarettes   Smokeless tobacco: Never   Tobacco comments:    2 per week  Vaping Use   Vaping status: Never Used  Substance and Sexual Activity   Alcohol use: Yes    Comment: 3-4 per week   Drug use: Never   Sexual activity: Not Currently    Comment: accepted condoms  Other Topics Concern   Not on file  Social History Narrative   Not on file   Social Drivers of Health   Financial Resource Strain: High Risk (01/04/2024)   Received from [facility]   Overall Financial Resource Strain (CARDIA)    Difficulty of Paying Living Expenses: Very hard  Food Insecurity: Food Insecurity Present (03/14/2024)   Hunger Vital Sign    Worried About Running Out of Food in the Last Year: Sometimes true    Ran Out of Food in the Last Year: Sometimes true  Transportation Needs: No Transportation Needs (03/14/2024)   PRAPARE - Administrator, Civil Service (Medical): No    Lack of Transportation (Non-Medical): No  Recent Concern: Transportation Needs - Unmet Transportation Needs (02/23/2024)   PRAPARE - Administrator, Civil Service (Medical): Yes  Lack of Transportation (Non-Medical): Yes  Physical Activity: Not on file  Stress: Not on file  Social Connections: Unknown (05/04/2022)   Received from [facility], [facility]   Social Network    Social Network: Not on file   Additional Social History:                         Sleep: Fair  Appetite:  Good  Current Medications: Current Facility-Administered Medications  Medication Dose Route Frequency Provider Last Rate Last Admin   alum  & mag hydroxide-simeth (MAALOX/MYLANTA) 200-200-20 MG/5ML suspension 30 mL  30 mL Oral Q4H PRN Tingling, Stephanie, PA-C       ascorbic acid (VITAMIN C) tablet 1,000 mg  1,000 mg Oral Daily Tingling, Stephanie, PA-C   1,000 mg at 03/19/24 1610   bictegravir-emtricitabine-tenofovir AF (BIKTARVY) 50-200-25 MG per tablet 1 tablet  1 tablet Oral Daily Tingling, Stephanie, PA-C   1 tablet at 03/19/24 9604   haloperidol (HALDOL) tablet 5 mg  5 mg Oral TID PRN Tingling, Judeth Cornfield, PA-C       And   diphenhydrAMINE (BENADRYL) capsule 50 mg  50 mg Oral TID PRN Tingling, Stephanie, PA-C   50 mg at 03/16/24 2205   haloperidol lactate (HALDOL) injection 5 mg  5 mg Intramuscular TID PRN Tingling, Stephanie, PA-C       And   diphenhydrAMINE (BENADRYL) injection 50 mg  50 mg Intramuscular TID PRN Tingling, Stephanie, PA-C       And   LORazepam (ATIVAN) injection 2 mg  2 mg Intramuscular TID PRN Tingling, Stephanie, PA-C       haloperidol lactate (HALDOL) injection 10 mg  10 mg Intramuscular TID PRN Tingling, Stephanie, PA-C       And   diphenhydrAMINE (BENADRYL) injection 50 mg  50 mg Intramuscular TID PRN Tingling, Stephanie, PA-C       And   LORazepam (ATIVAN) injection 2 mg  2 mg Intramuscular TID PRN Tingling, Stephanie, PA-C       docusate sodium (COLACE) capsule 100 mg  100 mg Oral BID Tingling, Stephanie, PA-C   100 mg at 03/19/24 5409   hydrOXYzine (ATARAX) tablet 25 mg  25 mg Oral TID PRN Tingling, Stephanie, PA-C   25 mg at 03/18/24 2041   ibuprofen (ADVIL) tablet 800 mg  800 mg Oral Q6H PRN Tingling, Stephanie, PA-C   800 mg at 03/18/24 1753   magnesium hydroxide (MILK OF MAGNESIA) suspension 30 mL  30 mL Oral Daily PRN Tingling, Stephanie, PA-C       melatonin tablet 5 mg  5 mg Oral QHS Tingling, Stephanie, PA-C   5 mg at 03/18/24 2041   nystatin (MYCOSTATIN) 100000 UNIT/ML suspension 500,000 Units  5 mL Oral QID Tingling, Stephanie, PA-C   500,000 Units at 03/19/24 1349   OLANZapine zydis  (ZYPREXA) disintegrating tablet 5 mg  5 mg Oral BID Tingling, Stephanie, PA-C   5 mg at 03/19/24 8119   propranolol (INDERAL) tablet 10 mg  10 mg Oral BID Myriam Forehand, NP   10 mg at 03/19/24 0810   sulfamethoxazole-trimethoprim (BACTRIM DS) 800-160 MG per tablet 1 tablet  1 tablet Oral Q12H Alford Highland, MD   1 tablet at 03/19/24 0815    Lab Results:  Results for orders placed or performed during the hospital encounter of 03/14/24 (from the past 48 hours)  Comprehensive metabolic panel     Status: Abnormal   Collection Time: 03/18/24  6:41 AM  Result Value Ref Range   Sodium 135 135 - 145 mmol/L   Potassium 4.3 3.5 - 5.1 mmol/L   Chloride 105 98 - 111 mmol/L   CO2 24 22 - 32 mmol/L   Glucose, Bld 108 (H) 70 - 99 mg/dL    Comment: Glucose reference range applies only to samples taken after fasting for at least 8 hours.   BUN 13 6 - 20 mg/dL   Creatinine, Ser 1.61 0.61 - 1.24 mg/dL   Calcium 9.3 8.9 - 09.6 mg/dL   Total Protein 8.3 (H) 6.5 - 8.1 g/dL   Albumin 3.4 (L) 3.5 - 5.0 g/dL   AST 22 15 - 41 U/L   ALT 16 0 - 44 U/L   Alkaline Phosphatase 71 38 - 126 U/L   Total Bilirubin 0.4 0.0 - 1.2 mg/dL   GFR, Estimated >04 >54 mL/min    Comment: (NOTE) Calculated using the CKD-EPI Creatinine Equation (2021)    Anion gap 6 5 - 15    Comment: Performed at [name], [address] [place], [place] [address]  Sedimentation rate     Status: Abnormal   Collection Time: 03/18/24  6:41 AM  Result Value Ref Range   Sed Rate 70 (H) 0 - 16 mm/hr    Comment: Performed at [facility], [address] [place], [place] [address]  CBC with Differential/Platelet     Status: Abnormal   Collection Time: 03/18/24  6:41 AM  Result Value Ref Range   WBC 3.7 (L) 4.0 - 10.5 K/uL   RBC 3.60 (L) 4.22 - 5.81 MIL/uL   Hemoglobin 11.7 (L) 13.0 - 17.0 g/dL   HCT 29.5 (L) 62.1 - 30.8 %   MCV 96.4 80.0 - 100.0 fL   MCH 32.5 26.0 - 34.0 pg   MCHC 33.7 30.0 - 36.0 g/dL   RDW  65.7 84.6 - 96.2 %   Platelets 301 150 - 400 K/uL   nRBC 0.0 0.0 - 0.2 %   Neutrophils Relative % 26 %   Neutro Abs 1.0 (L) 1.7 - 7.7 K/uL   Lymphocytes Relative 52 %   Lymphs Abs 1.9 0.7 - 4.0 K/uL   Monocytes Relative 12 %   Monocytes Absolute 0.5 0.1 - 1.0 K/uL   Eosinophils Relative 9 %   Eosinophils Absolute 0.4 0.0 - 0.5 K/uL   Basophils Relative 1 %   Basophils Absolute 0.0 0.0 - 0.1 K/uL   Immature Granulocytes 0 %   Abs Immature Granulocytes 0.01 0.00 - 0.07 K/uL    Comment: Performed at [facility], [address] [place], [place] [address]    Blood Alcohol level:  Lab Results  Component Value Date   [facility] <10 10/01/2022   ETH <10 03/25/2020    Metabolic Disorder Labs: Lab Results  Component Value Date   HGBA1C 5.1 03/15/2024   MPG 99.67 03/15/2024   No results found for: "PROLACTIN" Lab Results  Component Value Date   CHOL 108 03/15/2024   TRIG 106 03/15/2024   HDL 39 (L) 03/15/2024   CHOLHDL 2.8 03/15/2024   VLDL 21 03/15/2024   LDLCALC 48 03/15/2024   LDLCALC 40 11/07/2022    Physical Findings: AIMS:  , ,  ,  ,    CIWA:    COWS:     Musculoskeletal: Strength & Muscle Tone: within normal limits Gait & Station: normal Patient leans: N/A  Psychiatric Specialty Exam:  Presentation  General Appearance:  Fairly Groomed  Eye  Contact: Good  Speech: Clear and Coherent; Normal Rate  Speech Volume: Normal  Handedness: Right   Mood and Affect  Mood: Irritable; Anxious (Sad)  Affect: Restricted; Congruent   Thought Process  Thought Processes: Coherent; Goal Directed (preoccupied with medical prognosis and feelings of frustration)  Descriptions of Associations:Intact  Orientation:None  Thought Content:Logical  History of Schizophrenia/Schizoaffective disorder:Yes  Duration of Psychotic Symptoms:-- (none reported)  Hallucinations:Hallucinations: None Description of Command Hallucinations: denies  Ideas of  Reference:None  Suicidal Thoughts:Suicidal Thoughts: No  Homicidal Thoughts:Homicidal Thoughts: Yes, Active HI Active Intent and/or Plan: Without Intent; Without Means to Carry Out; Without Access to Means HI Passive Intent and/or Plan: Without Intent; Without Means to Carry Out; Without Access to Means; Without Plan   Sensorium  Memory: Immediate Good; Recent Good; Remote Good  Judgment: Poor (due to homicidal ideation and distress related to medical prognosis)  Insight: Poor (patient acknowledges emotional distress but struggles with coping strategies)   Executive Functions  Concentration: Fair  Attention Span: Fair  Recall: Good  Fund of Knowledge: Good  Language: Good   Psychomotor Activity  Psychomotor Activity: Psychomotor Activity: Normal; Restlessness   Assets  Assets: Communication Skills; Desire for Improvement; Resilience   Sleep  Sleep: Sleep: Good Number of Hours of Sleep: 6    Physical Exam: Physical Exam Vitals and nursing note reviewed.  Constitutional:      Appearance: Normal appearance.  HENT:     Head: Normocephalic and atraumatic.     Nose: Nose normal.  Pulmonary:     Effort: Pulmonary effort is normal.  Musculoskeletal:        General: Normal range of motion.     Cervical back: Normal range of motion.     Comments: Right hand with wound dressing  Neurological:     General: No focal deficit present.     Mental Status: He is alert and oriented to person, place, and time. Mental status is at baseline.    Review of Systems  Skin:        Right hand with wound dressing  Psychiatric/Behavioral:  Positive for substance abuse and suicidal ideas.   All other systems reviewed and are negative.  Blood pressure 112/67, pulse 85, temperature 98.2 F (36.8 C), resp. rate 20, height 5\' 9"  (1.753 m), weight 72.1 kg, SpO2 100%. Body mass index is 23.48 kg/m.   Treatment Plan Summary: Daily contact with patient to assess and  evaluate symptoms and progress in treatment and Medication management Sulfamethoxazole-Trimethoprim (Bactrim DS) 800-160 mg - 1 tablet twice daily for wound infection prophylaxis/treatment. Inderal (Propranolol) 10 mg - Twice daily for anxiety, tremors, or cardiovascular support Continue Zyprexa 5 mg BID for mood stabilization and psychosis management. Continue Hydroxyzine 25 mg TID PRN for anxiety. Melatonin 5 mg HS for sleep aid.  Myriam Forehand, NP 03/19/2024, 3:52 PM

## 2024-03-19 NOTE — Group Note (Signed)
 Date:  03/19/2024 Time:  9:00 PM  Group Topic/Focus:  Wrap-Up Group:   The focus of this group is to help patients review their daily goal of treatment and discuss progress on daily workbooks.    Participation Level:  Active  Participation Quality:  Appropriate  Affect:  Appropriate  Cognitive:  Appropriate  Insight: Appropriate  Engagement in Group:  Engaged  Modes of Intervention:  Discussion  Additional Comments:    Mark Galloway 03/19/2024, 9:00 PM

## 2024-03-19 NOTE — Group Note (Signed)
 Recreation Therapy Group Note   Group Topic:Goal Setting  Group Date: 03/19/2024 Start Time: 1000 End Time: 1055 Facilitators: Rosina Lowenstein, LRT, CTRS Location: Craft Room  Group Description: Product/process development scientist. Patients were given many different magazines, a glue stick, markers, and a piece of cardstock paper. LRT and pts discussed the importance of having goals in life. LRT and pts discussed the difference between short-term and long-term goals, as well as what a SMART goal is. LRT encouraged pts to create a vision board, with images they picked and then cut out with safety scissors from the magazine, for themselves, that capture their short and long-term goals. LRT encouraged pts to show and explain their vision board to the group.   Goal Area(s) Addressed:  Patient will gain knowledge of short vs. long term goals.  Patient will identify goals for themselves. Patient will practice setting SMART goals. Patient will verbalize their goals to LRT and peers.  Affect/Mood: N/A   Participation Level: Did not attend    Clinical Observations/Individualized Feedback: Patient did not attend group.   Plan: Continue to engage patient in RT group sessions 2-3x/week.   [address], LRT, CTRS 03/19/2024 1:12 PM

## 2024-03-19 NOTE — Plan of Care (Signed)
   Problem: Education: Goal: Knowledge of [facility] Education information/materials will improve Outcome: Progressing Goal: Emotional status will improve Outcome: Progressing

## 2024-03-19 NOTE — Plan of Care (Signed)
  Problem: Education: Goal: Knowledge of [place] [facility] Education information/materials will improve Outcome: Progressing Goal: Emotional status will improve Outcome: Progressing Goal: Mental status will improve Outcome: Progressing Goal: Verbalization of understanding the information provided will improve Outcome: Progressing   Problem: Activity: Goal: Interest or engagement in activities will improve Outcome: Progressing Goal: Sleeping patterns will improve Outcome: Progressing   Problem: Coping: Goal: Ability to verbalize frustrations and anger appropriately will improve Outcome: Progressing Goal: Ability to demonstrate self-control will improve Outcome: Progressing   Problem: Safety: Goal: Periods of time without injury will increase Outcome: Progressing

## 2024-03-19 NOTE — Group Note (Signed)
 Date:  03/19/2024 Time:  10:01 AM  Group Topic/Focus:  Rediscovering Joy:   The focus of this group is to explore various ways to relieve stress in a positive manner.    Participation Level:  Did Not Attend   Mary Sella Erandi Lemma 03/19/2024, 10:01 AM

## 2024-03-19 NOTE — Progress Notes (Signed)
   03/19/24 1800  Psych Admission Type (Psych Patients Only)  Admission Status Voluntary  Psychosocial Assessment  Patient Complaints Anxiety  Eye Contact Fair  Facial Expression Animated  Affect Appropriate to circumstance  Speech Logical/coherent  Interaction Assertive  Motor Activity Slow  Appearance/Hygiene Unremarkable  Behavior Characteristics Cooperative  Mood Anxious  Thought Process  Coherency WDL  Content WDL  Delusions None reported or observed  Perception WDL  Hallucination None reported or observed  Judgment Impaired  Confusion None  Danger to Self  Current suicidal ideation? Denies  Danger to Others  Danger to Others None reported or observed

## 2024-03-19 NOTE — Group Note (Signed)
 [facility] LCSW Group Therapy Note   Group Date: 03/19/2024 Start Time: 1300 End Time: 1340   Type of Therapy/Topic:  Group Therapy:  Balance in Life  Participation Level:  Active   Description of Group:    This group will address the concept of balance and how it feels and looks when one is unbalanced. Patients will be encouraged to process areas in their lives that are out of balance, and identify reasons for remaining unbalanced. Facilitators will guide patients utilizing problem- solving interventions to address and correct the stressor making their life unbalanced. Understanding and applying boundaries will be explored and addressed for obtaining  and maintaining a balanced life. Patients will be encouraged to explore ways to assertively make their unbalanced needs known to significant others in their lives, using other group members and facilitator for support and feedback.  Therapeutic Goals: Patient will identify two or more emotions or situations they have that consume much of in their lives. Patient will identify signs/triggers that life has become out of balance:  Patient will identify two ways to set boundaries in order to achieve balance in their lives:  Patient will demonstrate ability to communicate their needs through discussion and/or role plays  Summary of Patient Progress: Patient was present for the entirety of the group process. He was actively involved in the conversation. Patient identified a number of stressors that contribute to becoming off balanced. During the conversation he was engaged. Patient appeared open and receptive to feedback/comments from both his peers and facilitator.    Therapeutic Modalities:   Cognitive Behavioral Therapy Solution-Focused Therapy Assertiveness Training   Glenis Smoker, LCSW

## 2024-03-20 DIAGNOSIS — F319 Bipolar disorder, unspecified: Secondary | ICD-10-CM | POA: Diagnosis not present

## 2024-03-20 LAB — GLUCOSE, CAPILLARY: Glucose-Capillary: 238 mg/dL — ABNORMAL HIGH (ref 70–99)

## 2024-03-20 MED ORDER — OLANZAPINE 10 MG PO TBDP
10.0000 mg | ORAL_TABLET | Freq: Two times a day (BID) | ORAL | Status: DC
  Administered 2024-03-20 – 2024-03-24 (×8): 10 mg via ORAL
  Filled 2024-03-20 (×8): qty 1

## 2024-03-20 MED ORDER — INSULIN ASPART 100 UNIT/ML IJ SOLN
0.0000 [IU] | Freq: Three times a day (TID) | INTRAMUSCULAR | Status: DC
  Administered 2024-03-22 – 2024-03-25 (×2): 1 [IU] via SUBCUTANEOUS
  Filled 2024-03-20 (×3): qty 1

## 2024-03-20 MED ORDER — DOXYCYCLINE HYCLATE 100 MG PO TABS
100.0000 mg | ORAL_TABLET | Freq: Two times a day (BID) | ORAL | Status: DC
  Administered 2024-03-20 – 2024-03-21 (×2): 100 mg via ORAL
  Filled 2024-03-20 (×2): qty 1

## 2024-03-20 NOTE — Progress Notes (Addendum)
 [facility] MD Progress Note  03/20/2024 12:09 PM Mark Galloway  MRN:  [id number] Subjective:   38 year old African American male currently admitted to the behavioral health unit with a history of schizophrenia, who now presents with additional medical concerns related to wound.The patient reported, "my left foot has a black area," and was found to have necrotic tissue on the left foot. Additionally, his right hand dressing was intact but draining yellow eschar, raising concern for infection.The psychiatric team was notified by infection control that a recent superficial wound culture is growing MRSA, ESBL-producing E. coli, and a Streptococcus species. It is unclear if these organisms represent colonization or true infection. Given the lack of effective oral antibiotics and the complexity of organisms isolated, a medical consult has been requested for further evaluation and treatment planning. Capillary blood glucose (CBG) checks have been initiated to monitor for infection-related or metabolic decompensation. The patient continues to demonstrate residual symptoms, including blunted affect and intermittent internal preoccupation. In response, Zyprexa (Olanzapine) has been increased to 10 mg nightly to improve stabilization, reduce internal stimuli, and improve overall functioning. The patient remains cooperative and oriented but is emotionally focused on his medical status.   Principal Problem: Bipolar 1 disorder ([name]) Diagnosis: Principal Problem:   Bipolar 1 disorder ([name]) Active Problems:   AIDS ([name])   Paranoid schizophrenia ([name])   Wound infection after surgery  Total Time spent with patient: 2 hours  Past Psychiatric History: see below  Past Medical History:  Past Medical History:  Diagnosis Date   Bipolar 1 disorder ([name])    HIV infection ([name])    Schizophrenia ([name])    Shingles     Past Surgical History:  Procedure Laterality Date   I & D EXTREMITY Right 02/22/2024   Procedure:  IRRIGATION AND DEBRIDEMENT EXTREMITY;  Surgeon: Betha Loa, MD;  Location: [facility] OR;  Service: Orthopedics;  Laterality: Right;   IRRIGATION AND DEBRIDEMENT FOOT Left    Family History: History reviewed. No pertinent family history. Family Psychiatric  History: none reported Social History:  Social History   Substance and Sexual Activity  Alcohol Use Yes   Comment: 3-4 per week     Social History   Substance and Sexual Activity  Drug Use Never    Social History   Socioeconomic History   Marital status: Single    Spouse name: Not on file   Number of children: 0   Years of education: 16   Highest education level: Not on file  Occupational History   Occupation: Public relations account executive  Tobacco Use   Smoking status: Some Days    Types: Cigars, Cigarettes   Smokeless tobacco: Never   Tobacco comments:    2 per week  Vaping Use   Vaping status: Never Used  Substance and Sexual Activity   Alcohol use: Yes    Comment: 3-4 per week   Drug use: Never   Sexual activity: Not Currently    Comment: accepted condoms  Other Topics Concern   Not on file  Social History Narrative   Not on file   Social Drivers of Health   Financial Resource Strain: High Risk (01/04/2024)   Received from [facility]   Overall Financial Resource Strain (CARDIA)    Difficulty of Paying Living Expenses: Very hard  Food Insecurity: Food Insecurity Present (03/14/2024)   Hunger Vital Sign    Worried About Running Out of Food in the Last Year: Sometimes true    Ran Out of Food in the Last  Year: Sometimes true  Transportation Needs: No Transportation Needs (03/14/2024)   PRAPARE - Administrator, Civil Service (Medical): No    Lack of Transportation (Non-Medical): No  Recent Concern: Transportation Needs - Unmet Transportation Needs (02/23/2024)   PRAPARE - Administrator, Civil Service (Medical): Yes    Lack of Transportation (Non-Medical): Yes  Physical Activity: Not on file   Stress: Not on file  Social Connections: Unknown (05/04/2022)   Received from [facility]   Social Network    Social Network: Not on file   Additional Social History:                         Sleep: Good  Appetite:  Good  Current Medications: Current Facility-Administered Medications  Medication Dose Route Frequency Provider Last Rate Last Admin   alum & mag hydroxide-simeth (MAALOX/MYLANTA) 200-200-20 MG/5ML suspension 30 mL  30 mL Oral Q4H PRN Tingling, Stephanie, PA-C       ascorbic acid (VITAMIN C) tablet 1,000 mg  1,000 mg Oral Daily Tingling, Stephanie, PA-C   1,000 mg at 03/20/24 0920   bictegravir-emtricitabine-tenofovir AF (BIKTARVY) 50-200-25 MG per tablet 1 tablet  1 tablet Oral Daily Tingling, Stephanie, PA-C   1 tablet at 03/20/24 0920   haloperidol (HALDOL) tablet 5 mg  5 mg Oral TID PRN Tingling, Judeth Cornfield, PA-C       And   diphenhydrAMINE (BENADRYL) capsule 50 mg  50 mg Oral TID PRN Tingling, Stephanie, PA-C   50 mg at 03/16/24 2205   haloperidol lactate (HALDOL) injection 5 mg  5 mg Intramuscular TID PRN Tingling, Stephanie, PA-C       And   diphenhydrAMINE (BENADRYL) injection 50 mg  50 mg Intramuscular TID PRN Tingling, Stephanie, PA-C       And   LORazepam (ATIVAN) injection 2 mg  2 mg Intramuscular TID PRN Tingling, Stephanie, PA-C       haloperidol lactate (HALDOL) injection 10 mg  10 mg Intramuscular TID PRN Tingling, Stephanie, PA-C       And   diphenhydrAMINE (BENADRYL) injection 50 mg  50 mg Intramuscular TID PRN Tingling, Stephanie, PA-C       And   LORazepam (ATIVAN) injection 2 mg  2 mg Intramuscular TID PRN Tingling, Stephanie, PA-C       docusate sodium (COLACE) capsule 100 mg  100 mg Oral BID Tingling, Stephanie, PA-C   100 mg at 03/20/24 0920   hydrOXYzine (ATARAX) tablet 25 mg  25 mg Oral TID PRN Tingling, Stephanie, PA-C   25 mg at 03/18/24 2041   ibuprofen (ADVIL) tablet 800 mg  800 mg Oral Q6H PRN Tingling, Stephanie,  PA-C   800 mg at 03/19/24 2114   magnesium hydroxide (MILK OF MAGNESIA) suspension 30 mL  30 mL Oral Daily PRN Tingling, Stephanie, PA-C       melatonin tablet 5 mg  5 mg Oral QHS Tingling, Stephanie, PA-C   5 mg at 03/19/24 2111   nystatin (MYCOSTATIN) 100000 UNIT/ML suspension 500,000 Units  5 mL Oral QID Tingling, Stephanie, PA-C   500,000 Units at 03/20/24 0919   OLANZapine zydis (ZYPREXA) disintegrating tablet 5 mg  5 mg Oral BID Tingling, Stephanie, PA-C   5 mg at 03/20/24 0920   propranolol (INDERAL) tablet 10 mg  10 mg Oral BID Myriam Forehand, NP   10 mg at 03/20/24 0920   sulfamethoxazole-trimethoprim (BACTRIM DS) 800-160 MG per tablet 1  tablet  1 tablet Oral Q12H Alford Highland, MD   1 tablet at 03/20/24 0865    Lab Results: No results found for this or any previous visit (from the past 48 hours).  Blood Alcohol level:  Lab Results  Component Value Date   ETH <10 10/01/2022   ETH <10 03/25/2020    Metabolic Disorder Labs: Lab Results  Component Value Date   HGBA1C 5.1 03/15/2024   MPG 99.67 03/15/2024   No results found for: "PROLACTIN" Lab Results  Component Value Date   CHOL 108 03/15/2024   TRIG 106 03/15/2024   HDL 39 (L) 03/15/2024   CHOLHDL 2.8 03/15/2024   VLDL 21 03/15/2024   LDLCALC 48 03/15/2024   LDLCALC 40 11/07/2022    Physical Findings: AIMS:  , ,  ,  ,    CIWA:    COWS:     Musculoskeletal: Strength & Muscle Tone: within normal limits Gait & Station: normal Patient leans: N/A  Psychiatric Specialty Exam:  Presentation  General Appearance:  Fairly Groomed  Eye Contact: Good  Speech: Clear and Coherent; Normal Rate  Speech Volume: Normal  Handedness: Right   Mood and Affect  Mood: Angry; Anxious; Irritable  Affect: Restricted; Tearful; Congruent   Thought Process  Thought Processes: Coherent  Descriptions of Associations:Intact  Orientation:None  Thought Content:Rumination  History of  Schizophrenia/Schizoaffective disorder:Yes  Duration of Psychotic Symptoms:-- (none reported)  Hallucinations:Description of Command Hallucinations: denies  Ideas of Reference:None  Suicidal Thoughts:Suicidal Thoughts: No  Homicidal Thoughts:Homicidal Thoughts: Yes, Passive HI Active Intent and/or Plan: Without Intent; Without Plan; Without Access to Means HI Passive Intent and/or Plan: Without Intent; Without Means to Carry Out; Without Access to Means; Without Plan   Sensorium  Memory: Immediate Good; Recent Good; Remote Good  Judgment: Poor  Insight: Poor   Executive Functions  Concentration: Fair  Attention Span: Fair  Recall: Good  Fund of Knowledge: Good  Language: Good   Psychomotor Activity  Psychomotor Activity: Psychomotor Activity: Normal   Assets  Assets: Communication Skills   Sleep  Sleep: Sleep: Fair Number of Hours of Sleep: 6    Physical Exam: Physical Exam Vitals reviewed.  Constitutional:      Appearance: Normal appearance.  HENT:     Head: Normocephalic and atraumatic.     Nose: Nose normal.  Pulmonary:     Effort: Pulmonary effort is normal.  Musculoskeletal:        General: Normal range of motion.     Cervical back: Normal range of motion.     Left lower leg: Edema present.     Comments: anterior left foot with necrotic area  Skin:    Capillary Refill: Capillary refill takes more than 3 seconds.     Findings: Erythema and lesion present.  Neurological:     General: No focal deficit present.     Mental Status: He is alert and oriented to person, place, and time. Mental status is at baseline.  Psychiatric:        Attention and Perception: Attention and perception normal.        Mood and Affect: Mood is anxious. Affect is flat.        Speech: Speech normal.        Behavior: Behavior normal. Behavior is cooperative.        Thought Content: Thought content normal.        Cognition and Memory: Cognition and memory  normal.        Judgment: Judgment  is impulsive.    Review of Systems  Skin:        Right hand dressing anterior left foot with necrotic area  Psychiatric/Behavioral:  Positive for depression and substance abuse. The patient is nervous/anxious.   All other systems reviewed and are negative.  Blood pressure 115/73, pulse 80, temperature 98.1 F (36.7 C), resp. rate 14, height 5\' 9"  (1.753 m), weight 72.1 kg, SpO2 99%. Body mass index is 23.48 kg/m.   Treatment Plan Summary: Daily contact with patient to assess and evaluate symptoms and progress in treatment and Medication management Increase Zyprexa (Olanzapine) 10 mg PO QHS - Increased from previous dose for stabilization of psychotic sym Medical and Ortho consult pending to determine appropriate antibiotic management Wound care team to assess left foot necrosis and right hand drainage CBG monitoring initiated to assess for hyperglycemia or infection-related metabolic disturbance Sulfamethoxazole-Trimethoprim (Bactrim DS) 800-160 mg - 1 tablet twice daily for wound infection prophylaxis/treatment. Inderal (Propranolol) 10 mg - Twice daily for anxiety, tremors, or cardiovascular support Continue Hydroxyzine 25 mg TID PRN for anxiety. Melatonin 5 mg HS for sleep aid.  Myriam Forehand, NP 03/20/2024, 12:09 PM

## 2024-03-20 NOTE — Progress Notes (Signed)
   03/20/24 1100  Psych Admission Type (Psych Patients Only)  Admission Status Voluntary  Psychosocial Assessment  Patient Complaints Anxiety;Depression  Eye Contact Fair  Facial Expression Animated  Affect Appropriate to circumstance  Speech Logical/coherent  Interaction Assertive  Motor Activity Slow  Appearance/Hygiene Unremarkable  Behavior Characteristics Cooperative  Mood Anxious  Thought Process  Coherency WDL  Content WDL  Delusions WDL;None reported or observed  Perception WDL  Hallucination None reported or observed  Judgment Impaired  Confusion WDL  Danger to Self  Current suicidal ideation? Denies  Self-Injurious Behavior No self-injurious ideation or behavior indicators observed or expressed   Agreement Not to Harm Self Yes  Description of Agreement verbal  Danger to Others  Danger to Others None reported or observed

## 2024-03-20 NOTE — Progress Notes (Signed)
   03/19/24 2000  Psych Admission Type (Psych Patients Only)  Admission Status Voluntary  Psychosocial Assessment  Patient Complaints Anxiety  Eye Contact Fair  Facial Expression Animated  Affect Appropriate to circumstance  Speech Logical/coherent  Interaction Assertive  Motor Activity Slow  Appearance/Hygiene Unremarkable  Behavior Characteristics Cooperative  Mood Anxious  Aggressive Behavior  Effect No apparent injury  Thought Process  Coherency WDL  Content WDL  Delusions None reported or observed  Perception WDL  Hallucination None reported or observed  Judgment Impaired  Confusion None  Danger to Self  Current suicidal ideation?  (Denies)  Danger to Others  Danger to Others None reported or observed

## 2024-03-20 NOTE — Plan of Care (Signed)
   Problem: Education: Goal: Knowledge of [facility] Education information/materials will improve Outcome: Progressing Goal: Emotional status will improve Outcome: Progressing

## 2024-03-20 NOTE — Group Note (Signed)
 Date:  03/20/2024 Time:  10:28 AM  Group Topic/Focus:  Spirituality:   The focus of this group is to discuss how one's spirituality can aide in recovery.    Participation Level:  Active  Participation Quality:  Appropriate  Affect:  Appropriate  Cognitive:  Appropriate  Insight: Appropriate  Engagement in Group:  Engaged  Modes of Intervention:  Activity  Additional Comments:    Mary Sella Lakaisha Danish 03/20/2024, 10:28 AM

## 2024-03-20 NOTE — Group Note (Signed)
 Date:  03/20/2024 Time:  8:39 PM  Group Topic/Focus:  Recovery Goals:   The focus of this group is to identify appropriate goals for recovery and establish a plan to achieve them. Wrap-Up Group:   The focus of this group is to help patients review their daily goal of treatment and discuss progress on daily workbooks.    Participation Level:  Active  Participation Quality:  Appropriate and Attentive  Affect:  Appropriate and Excited  Cognitive:  Alert and Appropriate  Insight: Appropriate  Engagement in Group:  Engaged  Modes of Intervention:  Discussion and Orientation  Additional Comments:     Maglione,Kimarie Coor E 03/20/2024, 8:39 PM

## 2024-03-20 NOTE — Group Note (Signed)
 Recreation Therapy Group Note   Group Topic:Leisure Education  Group Date: 03/20/2024 Start Time: 1300 End Time: 1400 Facilitators: Rosina Lowenstein, LRT, CTRS Location:  Craft Room  Group Description: Leisure. Patients were given the option to choose from singing karaoke, coloring mandalas, using oil pastels, journaling, or playing with play-doh. LRT and pts discussed the meaning of leisure, the importance of participating in leisure during their free time/when they're outside of the hospital, as well as how our leisure interests can also serve as coping skills.   Goal Area(s) Addressed:  Patient will identify a current leisure interest.  Patient will learn the definition of "leisure". Patient will practice making a positive decision. Patient will have the opportunity to try a new leisure activity. Patient will communicate with peers and LRT.    Affect/Mood: N/A   Participation Level: Did not attend    Clinical Observations/Individualized Feedback: Patient did not attend group.   Plan: Continue to engage patient in RT group sessions 2-3x/week.   Rosina Lowenstein, LRT, CTRS 03/20/2024 2:36 PM

## 2024-03-21 ENCOUNTER — Inpatient Hospital Stay: Payer: Self-pay

## 2024-03-21 ENCOUNTER — Encounter: Payer: Self-pay | Admitting: Nurse Practitioner

## 2024-03-21 DIAGNOSIS — F319 Bipolar disorder, unspecified: Secondary | ICD-10-CM | POA: Diagnosis not present

## 2024-03-21 DIAGNOSIS — T8149XA Infection following a procedure, other surgical site, initial encounter: Secondary | ICD-10-CM | POA: Diagnosis not present

## 2024-03-21 DIAGNOSIS — L03116 Cellulitis of left lower limb: Secondary | ICD-10-CM

## 2024-03-21 DIAGNOSIS — M869 Osteomyelitis, unspecified: Secondary | ICD-10-CM | POA: Diagnosis present

## 2024-03-21 DIAGNOSIS — I10 Essential (primary) hypertension: Secondary | ICD-10-CM | POA: Diagnosis present

## 2024-03-21 LAB — CBC
HCT: 33.2 % — ABNORMAL LOW (ref 39.0–52.0)
Hemoglobin: 11.4 g/dL — ABNORMAL LOW (ref 13.0–17.0)
MCH: 33.4 pg (ref 26.0–34.0)
MCHC: 34.3 g/dL (ref 30.0–36.0)
MCV: 97.4 fL (ref 80.0–100.0)
Platelets: 317 10*3/uL (ref 150–400)
RBC: 3.41 MIL/uL — ABNORMAL LOW (ref 4.22–5.81)
RDW: 14.4 % (ref 11.5–15.5)
WBC: 7.4 10*3/uL (ref 4.0–10.5)
nRBC: 0 % (ref 0.0–0.2)

## 2024-03-21 LAB — GLUCOSE, CAPILLARY
Glucose-Capillary: 108 mg/dL — ABNORMAL HIGH (ref 70–99)
Glucose-Capillary: 114 mg/dL — ABNORMAL HIGH (ref 70–99)
Glucose-Capillary: 118 mg/dL — ABNORMAL HIGH (ref 70–99)
Glucose-Capillary: 95 mg/dL (ref 70–99)

## 2024-03-21 LAB — SEDIMENTATION RATE: Sed Rate: 84 mm/h — ABNORMAL HIGH (ref 0–15)

## 2024-03-21 LAB — C-REACTIVE PROTEIN: CRP: 3.5 mg/dL — ABNORMAL HIGH (ref <1.0)

## 2024-03-21 MED ORDER — AMOXICILLIN-POT CLAVULANATE 875-125 MG PO TABS: 1.0000 | ORAL_TABLET | Freq: Two times a day (BID) | ORAL | Status: DC

## 2024-03-21 MED ORDER — HYDRALAZINE HCL 50 MG PO TABS: 50.0000 mg | ORAL_TABLET | ORAL | Status: DC | PRN

## 2024-03-21 MED ORDER — ACETAMINOPHEN 325 MG PO TABS
650.0000 mg | ORAL_TABLET | Freq: Four times a day (QID) | ORAL | Status: DC | PRN
  Administered 2024-03-21 – 2024-03-30 (×6): 650 mg via ORAL
  Filled 2024-03-21 (×6): qty 2

## 2024-03-21 MED ORDER — LINEZOLID 600 MG PO TABS
600.0000 mg | ORAL_TABLET | Freq: Two times a day (BID) | ORAL | Status: AC
  Administered 2024-03-21 – 2024-03-26 (×10): 600 mg via ORAL
  Filled 2024-03-21 (×11): qty 1

## 2024-03-21 MED ORDER — NICOTINE 21 MG/24HR TD PT24
21.0000 mg | MEDICATED_PATCH | Freq: Every day | TRANSDERMAL | Status: DC
  Administered 2024-03-24 – 2024-03-30 (×2): 21 mg via TRANSDERMAL
  Filled 2024-03-21 (×8): qty 1

## 2024-03-21 MED ORDER — AMOXICILLIN-POT CLAVULANATE 875-125 MG PO TABS
1.0000 | ORAL_TABLET | Freq: Two times a day (BID) | ORAL | Status: DC
  Administered 2024-03-21 – 2024-03-23 (×4): 1 via ORAL
  Filled 2024-03-21 (×5): qty 1

## 2024-03-21 NOTE — Consult Note (Signed)
 ORTHOPAEDIC CONSULTATION  REQUESTING PHYSICIAN: Verner Chol, MD  Chief Complaint: Right small finger infection  HPI: Mark Galloway is a 38 y.o. male who complains of right small finger infection for the last month.  On 02/22/2024 he underwent irrigation and debridement of the right small finger tendon sheath and hand and infection by Dr. Betha Loa one of the hand specialist in [place].  He was taking p.o. Bactrim postoperatively after leaving the hospital.  Due to mental health issues she was at admitted to [facility] behavioral health unit 03/12/2024.  Since that time he continues to have some drainage from the right small finger wound.  He has been afebrile.  He does not feel ill.  His white blood count has been normal.  His recent sed rate was elevated at 70.  Recent cultures grew MRSA and E. coli.  In addition he notes that over the last 2 days he has been added to swelling and pain over the dorsum of the MP joint of the left great toe.  He does not know how this occurred.  He denies any injections in this area.  Past Medical History:  Diagnosis Date   Bipolar 1 disorder ([name])    HIV infection ([name])    Schizophrenia ([name])    Shingles    Past Surgical History:  Procedure Laterality Date   I & D EXTREMITY Right 02/22/2024   Procedure: IRRIGATION AND DEBRIDEMENT EXTREMITY;  Surgeon: Betha Loa, MD;  Location: [facility] OR;  Service: Orthopedics;  Laterality: Right;   IRRIGATION AND DEBRIDEMENT FOOT Left    Social History   Socioeconomic History   Marital status: Single    Spouse name: Not on file   Number of children: 0   Years of education: 16   Highest education level: Not on file  Occupational History   Occupation: Flight Service  Tobacco Use   Smoking status: Some Days    Types: Cigars, Cigarettes   Smokeless tobacco: Never   Tobacco comments:    2 per week  Vaping Use   Vaping status: Never Used  Substance and Sexual Activity   Alcohol use: Yes    Comment: 3-4 per week    Drug use: Never   Sexual activity: Not Currently    Comment: accepted condoms  Other Topics Concern   Not on file  Social History Narrative   Not on file   Social Drivers of Health   Financial Resource Strain: High Risk (01/04/2024)   Received from [facility]   Overall Financial Resource Strain (CARDIA)    Difficulty of Paying Living Expenses: Very hard  Food Insecurity: Food Insecurity Present (03/14/2024)   Hunger Vital Sign    Worried About Running Out of Food in the Last Year: Sometimes true    Ran Out of Food in the Last Year: Sometimes true  Transportation Needs: No Transportation Needs (03/14/2024)   PRAPARE - Administrator, Civil Service (Medical): No    Lack of Transportation (Non-Medical): No  Recent Concern: Transportation Needs - Unmet Transportation Needs (02/23/2024)   PRAPARE - Transportation    Lack of Transportation (Medical): Yes    Lack of Transportation (Non-Medical): Yes  Physical Activity: Not on file  Stress: Not on file  Social Connections: Unknown (05/04/2022)   Received from [facility]   Social Network    Social Network: Not on file   History reviewed. No pertinent family history. Allergies  Allergen Reactions   Peanut Oil Hives  Peanut-Containing Drug Products    Prior to Admission medications   Medication Sig Start Date End Date Taking? Authorizing Provider  bictegravir-emtricitabine-tenofovir AF (BIKTARVY) 50-200-25 MG TABS tablet Take 1 tablet by mouth daily. 02/24/24  Yes Pokhrel, Laxman, MD  ascorbic acid (VITAMIN C) 1000 MG tablet Take 1 tablet (1,000 mg total) by mouth daily. Patient not taking: Reported on 03/14/2024 02/24/24   Joycelyn Das, MD  sulfamethoxazole-trimethoprim (BACTRIM DS) 800-160 MG tablet Take 1 tablet by mouth 2 (two) times daily. Patient not taking: Reported on 03/14/2024 02/24/24   Joycelyn Das, MD   No results found.  Positive ROS: All other systems have been reviewed and were  otherwise negative with the exception of those mentioned in the HPI and as above.  Physical Exam: General: Alert, no acute distress Cardiovascular: No pedal edema Respiratory: No cyanosis, no use of accessory musculature GI: No organomegaly, abdomen is soft and non-tender Skin: No lesions in the area of chief complaint Neurologic: Sensation intact distally Psychiatric: Patient is competent for consent with normal mood and affect Lymphatic: No axillary or cervical lymphadenopathy  MUSCULOSKELETAL: Right small finger shows a open wound on the volar aspect of the finger from the DIP flexion crease to just proximal to the MP flexion crease. There is good granulation tissue covering all tendons and nerves.  Range of motion is fair.  There is some swelling.  Sensations good distally. Left foot shows a abscess over the dorsum of the MP joint of the great toe.  There are some surrounding cellulitis.  There is mild pain.  Assessment: Healing infection right small finger with satisfactory granulation tissue present.  No sign of sepsis and no proximal cellulitis.  Plan: xContinue oral antibiotics for the small finger superficial infection He would benefit from warm saline soaks 4 times a day with peroxide added. This may be facilitated  more easily on a regular hospital floor. Recommend podiatry consult for his left foot infection  X-rays recommended as well.    Valinda Hoar, MD [phone]   03/21/2024 6:56 PM

## 2024-03-21 NOTE — Plan of Care (Signed)
   Problem: Education: Goal: Knowledge of [place] [facility] Education information/materials will improve Outcome: Progressing Goal: Emotional status will improve Outcome: Progressing Goal: Mental status will improve Outcome: Progressing

## 2024-03-21 NOTE — Plan of Care (Signed)
   Problem: Education: Goal: Emotional status will improve Outcome: Progressing   Problem: Education: Goal: Mental status will improve Outcome: Progressing

## 2024-03-21 NOTE — Consult Note (Signed)
 Medical Consultation   Mark Galloway  ZOX:[id number]  DOB: 06/23/1986  DOA: 03/14/2024  PCP: Oneita Hurt, No   Outpatient Specialists:    Requesting physician: [facility] NP, Keith Rake  Reason for consultation: -left foot pain  History of Present Illness: Mark Galloway is an 38 y.o. male with PMH of  HIV on HART, bipolar disorder, paranoid schizophrenia, first presented to hospital on 02/21/24 with swelling and blister of the right hand was found to have a right middle finger flexor sheath infection and abscess subsequently underwent I&D on 2/22 and subsequently treated with IV antibiotics and discharge on 2/23 with Bactrim.  Patient subsequently admitted to behavioral health unit to treat bipolar disorder on 3/18. Patient was evaluated again by internal medicine and concerning for recurrent infection and Bactrim was restarted, and patient completed Bactrim treatment however continue to experience pain associated with right mid finger and worsening of purulent discharge with intermittent bleeding.    Findings of X-ray from [facility] are concerning for early osteomyelitis. Recent superficial culture showing MRSA, E. Coli, and Strep. Dr. Mikey College from our group did consult today 3/22 and escalate ABX to Zyvox. MRI of right fingers pending. Consulted Dr. Hyacinth Meeker of ortho.    I am asked by [facility] NP to see pt again this evening 3/22, due to left foot pain and elevate Bp. His Bp is 116/68. Pt has bloody blister over the dorsum of the MP joint of the left great toe, with surrounding erythema in left foot. Pt has mild tenderness in left foot.  Denies fever and chills.  Patient does not have chest pain, cough, SOB.  No nausea, vomiting, diarrhea or abdominal pain.  No symptoms of UTI.  Patient denies IV drug use.     Date reviewed, lab, image and vitals: WBC 7.4, temperature normal, blood pressure 116/68, heart rate 92, RR 18.  X-ray of left foot: pending  MRI of right fingers: pending   EKG: done  on 3/18. I reviewed EKG independently.  Sinus rhythm, QTc 397, no ischemic change.  Review of Systems:   General: no fevers, chills, no changes in body weight, no changes in appetite Skin: no rash HEENT: no blurry vision, hearing changes or sore throat Pulm: no dyspnea, coughing, wheezing CV: no chest pain, palpitations, shortness of breath Abd: no nausea/vomiting, abdominal pain, diarrhea/constipation GU: no dysuria, hematuria, polyuria Ext: has right hand and left foot pain Neuro: no weakness, numbness, or tingling Psychiatry: Denies suicidal homicidal ideations.    Past Medical History: Past Medical History:  Diagnosis Date   Bipolar 1 disorder ([name])    HIV infection ([name])    Schizophrenia ([name])    Shingles     Past Surgical History: Past Surgical History:  Procedure Laterality Date   I & D EXTREMITY Right 02/22/2024   Procedure: IRRIGATION AND DEBRIDEMENT EXTREMITY;  Surgeon: Betha Loa, MD;  Location: [facility] OR;  Service: Orthopedics;  Laterality: Right;   IRRIGATION AND DEBRIDEMENT FOOT Left      Allergies:   Allergies  Allergen Reactions   Peanut Oil Hives   Peanut-Containing Drug Products      Social History:  reports that he has been smoking cigars and cigarettes. He has never used smokeless tobacco. He reports current alcohol use. He reports that he does not use drugs.  Family History: History reviewed. No pertinent family history.  I have reviewed with patient about his family medical history, but the  patient states that he does not know his family medical history.   Physical Exam: Vitals:   03/20/24 1804 03/21/24 0634 03/21/24 1655 03/21/24 1825  BP: 125/75 121/69 (!) 130/103 116/68  Pulse: 87 80 91 92  Resp:   18   Temp: 97.9 F (36.6 C) 97.9 F (36.6 C) 98.4 F (36.9 C)   TempSrc:   Oral   SpO2: 100% 99% 100% 100%  Weight:      Height:         General: Not in acute distress HEENT:       Eyes: PERRL, EOMI, no scleral icterus.       ENT:  No discharge from the ears and nose, no pharynx injection, no tonsillar enlargement.        Neck: No JVD, no bruit, no mass felt. Heme: No neck lymph node enlargement. Cardiac: S1/S2, RRR, No murmurs, No gallops or rubs. Respiratory: No rales, wheezing, rhonchi or rubs. GI: Soft, nondistended, nontender, no rebound pain, no organomegaly, BS present. GU: No hematuria Ext: No pitting leg edema bilaterally. 1+DP/PT pulse bilaterally. Musculoskeletal: No joint deformities, No joint redness or warmth, no limitation of ROM in spin. Skin: Patient has a bloody blister in the left foot, with surrounding erythema, tenderness and warmth.    Neuro: Alert, oriented X3, cranial nerves II-XII grossly intact, moves all extremities normally. Psych: Patient is not psychotic, no suicidal or hemocidal ideation.    Data reviewed:  I have personally reviewed following labs and imaging studies Labs:  CBC: Recent Labs  Lab 03/15/24 1425 03/18/24 0641 03/21/24 1301  WBC 3.3* 3.7* 7.4  NEUTROABS 1.2* 1.0*  --   HGB 11.9* 11.7* 11.4*  HCT 36.5* 34.7* 33.2*  MCV 100* 96.4 97.4  PLT 317 301 317    Basic Metabolic Panel: Recent Labs  Lab 03/18/24 0641  NA 135  K 4.3  CL 105  CO2 24  GLUCOSE 108*  BUN 13  CREATININE 0.84  CALCIUM 9.3   GFR Estimated Creatinine Clearance: 120.4 mL/min (by C-G formula based on SCr of 0.84 mg/dL). Liver Function Tests: Recent Labs  Lab 03/18/24 0641  AST 22  ALT 16  ALKPHOS 71  BILITOT 0.4  PROT 8.3*  ALBUMIN 3.4*   No results for input(s): "LIPASE", "AMYLASE" in the last 168 hours. No results for input(s): "AMMONIA" in the last 168 hours. Coagulation profile No results for input(s): "INR", "PROTIME" in the last 168 hours.  Cardiac Enzymes: No results for input(s): "CKTOTAL", "CKMB", "CKMBINDEX", "TROPONINI" in the last 168 hours. BNP: Invalid input(s): "POCBNP" CBG: Recent Labs  Lab 03/20/24 2143 03/21/24 0814 03/21/24 1141 03/21/24 1651   GLUCAP 238* 114* 118* 95   D-Dimer No results for input(s): "DDIMER" in the last 72 hours. Hgb A1c No results for input(s): "HGBA1C" in the last 72 hours. Lipid Profile No results for input(s): "CHOL", "HDL", "LDLCALC", "TRIG", "CHOLHDL", "LDLDIRECT" in the last 72 hours. Thyroid function studies No results for input(s): "TSH", "T4TOTAL", "T3FREE", "THYROIDAB" in the last 72 hours.  Invalid input(s): "FREET3" Anemia work up No results for input(s): "VITAMINB12", "FOLATE", "FERRITIN", "TIBC", "IRON", "RETICCTPCT" in the last 72 hours. Urinalysis    Component Value Date/Time   COLORURINE YELLOW 04/17/2021 1556   APPEARANCEUR CLEAR 04/17/2021 1556   LABSPEC 1.023 04/17/2021 1556   PHURINE 8.0 04/17/2021 1556   GLUCOSEU NEGATIVE 04/17/2021 1556   HGBUR NEGATIVE 04/17/2021 1556   BILIRUBINUR NEGATIVE 04/17/2021 1556   KETONESUR NEGATIVE 04/17/2021 1556   PROTEINUR TRACE (A)  04/17/2021 1556   NITRITE NEGATIVE 04/17/2021 1556   LEUKOCYTESUR NEGATIVE 04/17/2021 1556     Microbiology Recent Results (from the past 240 hours)  Aerobic/Anaerobic Culture w Gram Stain (surgical/deep wound)     Status: None (Preliminary result)   Collection Time: 03/17/24 11:08 AM   Specimen: Wound  Result Value Ref Range Status   Specimen Description   Final    WOUND Performed at [facility], [address], [place], [place] [address]    Special Requests   Final    [facility] Performed at [facility], [address] [place], [place] [address]    Gram Stain   Final    FEW WBC PRESENT, PREDOMINANTLY PMN FEW GRAM POSITIVE COCCI IN PAIRS FEW GRAM NEGATIVE RODS Performed at [facility], [address], [place], [place] [address]    Culture   Final    MODERATE METHICILLIN RESISTANT STAPHYLOCOCCUS AUREUS RARE ESCHERICHIA COLI Confirmed Extended Spectrum Beta-Lactamase Producer (ESBL).  In bloodstream infections from ESBL organisms, carbapenems are preferred over  piperacillin/tazobactam. They are shown to have a lower risk of mortality. FEW STREPTOCOCCUS GROUP C Beta hemolytic streptococci are predictably susceptible to penicillin and other beta lactams. Susceptibility testing not routinely performed. NO ANAEROBES ISOLATED; CULTURE IN PROGRESS FOR 5 DAYS    Report Status PENDING  Incomplete   Organism ID, Bacteria METHICILLIN RESISTANT STAPHYLOCOCCUS AUREUS  Final   Organism ID, Bacteria ESCHERICHIA COLI  Final      Susceptibility   Escherichia coli - MIC*    AMPICILLIN >=32 RESISTANT Resistant     CEFEPIME 16 RESISTANT Resistant     CEFTAZIDIME RESISTANT Resistant     CEFTRIAXONE >=64 RESISTANT Resistant     CIPROFLOXACIN >=4 RESISTANT Resistant     GENTAMICIN <=1 SENSITIVE Sensitive     IMIPENEM <=0.25 SENSITIVE Sensitive     TRIMETH/SULFA >=320 RESISTANT Resistant     AMPICILLIN/SULBACTAM 8 SENSITIVE Sensitive     PIP/TAZO <=4 SENSITIVE Sensitive ug/mL    * RARE ESCHERICHIA COLI   Methicillin resistant staphylococcus aureus - MIC*    CIPROFLOXACIN <=0.5 SENSITIVE Sensitive     ERYTHROMYCIN >=8 RESISTANT Resistant     GENTAMICIN <=0.5 SENSITIVE Sensitive     OXACILLIN >=4 RESISTANT Resistant     TETRACYCLINE >=16 RESISTANT Resistant     VANCOMYCIN 1 SENSITIVE Sensitive     TRIMETH/SULFA <=10 SENSITIVE Sensitive     CLINDAMYCIN <=0.25 SENSITIVE Sensitive     RIFAMPIN <=0.5 SENSITIVE Sensitive     Inducible Clindamycin NEGATIVE Sensitive     LINEZOLID 2 SENSITIVE Sensitive     * MODERATE METHICILLIN RESISTANT STAPHYLOCOCCUS AUREUS       Inpatient Medications:   Scheduled Meds:  amoxicillin-clavulanate  1 tablet Oral Q12H   ascorbic acid  1,000 mg Oral Daily   bictegravir-emtricitabine-tenofovir AF  1 tablet Oral Daily   docusate sodium  100 mg Oral BID   insulin aspart  0-6 Units Subcutaneous TID WC   linezolid  600 mg Oral Q12H   melatonin  5 mg Oral QHS   [START ON 03/22/2024] nicotine  21 mg Transdermal Daily   nystatin  5  mL Oral QID   OLANZapine zydis  10 mg Oral BID   propranolol  10 mg Oral BID   Continuous Infusions:   Radiological Exams on Admission: No results found.  Impression/Recommendations Principal Problem:   Bipolar 1 disorder ([name]) Active Problems:   Wound infection after surgery   Finger osteomyelitis ([name])  Cellulitis of foot, left   AIDS ([name])   Paranoid schizophrenia ([name])   HTN (hypertension)    Assessment and Plan:  Wound infection after surgery and possible finger osteomyelitis:  pt has signs of worsening wound infection on the right middle finger with continuous intermittent wound bleeding and thick yellowish pus.  [facility] from [facility] concerning for early osteomyelitis. Recent superficial culture showing MRSA, E. Coli, and Strep. Pt is switched to zyvox by Dr. Chipper Herb.  Patient does not have fever or leukocytosis.  Clinically not septic.  Consulted Dr. Hyacinth Meeker of ortho.  -continue Zyvox -pending MRI of the right middle finger -Consult wound care team  Cellulitis of foot, left: Patient has bloody blister in the right foot with surrounding erythema, indicating cellulitis.  Patient likely had injury which he was not aware of it.  Patient does not have fever or leukocytosis, not septic. -Will continue Zyvox -will add Augmentin bid -will get Blood culture -check CRP and ESR -f/u X-ray of left foot. If pt has any bone fracture, may need to consult podiatry.    AIDS ([name]) -Continue Biktarvy.  Last CD4 count 146.   Bipolar 1 disorder and paranoid schizophrenia: Patient is calm now. Treatment as per psychiatry team.   HTN: [facility] NP Keith Rake told me that pt has elevated blood pressure, but his blood pressure is 116/68 as documented in epic.  -Patient is propranolol 10 mg twice daily currently. -Will add prn oral hydralazine 50 mg every 2-hour for SBP > 160      Thank you for this consultation.  Our [facility] hospitalist team will follow the patient with you.  Time  Spent:  35 min      Lorretta Harp M.D. [facility] 03/21/2024, 10:00 PM

## 2024-03-21 NOTE — Progress Notes (Signed)
   03/21/24 0818  Psych Admission Type (Psych Patients Only)  Admission Status Voluntary  Psychosocial Assessment  Patient Complaints Anxiety;Depression  Eye Contact Fair  Facial Expression Animated  Affect Anxious  Speech Logical/coherent  Interaction Assertive  Motor Activity Slow  Appearance/Hygiene Unremarkable  Behavior Characteristics Cooperative  Mood Anxious  Aggressive Behavior  Effect No apparent injury  Thought Process  Coherency WDL  Content WDL  Delusions None reported or observed  Perception WDL  Hallucination None reported or observed  Judgment Impaired  Confusion None  Danger to Self  Current suicidal ideation? Denies  Self-Injurious Behavior No self-injurious ideation or behavior indicators observed or expressed   Agreement Not to Harm Self Yes  Description of Agreement Verbal  Danger to Others  Danger to Others None reported or observed

## 2024-03-21 NOTE — BH IP Treatment Plan (Signed)
 Interdisciplinary Treatment and Diagnostic Plan Update  03/21/2024 Time of Session: 10:47 am Mark Galloway MRN: [id number]  Principal Diagnosis: Bipolar 1 disorder [facility])  Secondary Diagnoses: Principal Problem:   Bipolar 1 disorder ([name]) Active Problems:   AIDS ([name])   Paranoid schizophrenia ([name])   Wound infection after surgery   Current Medications:  Current Facility-Administered Medications  Medication Dose Route Frequency Provider Last Rate Last Admin   alum & mag hydroxide-simeth (MAALOX/MYLANTA) 200-200-20 MG/5ML suspension 30 mL  30 mL Oral Q4H PRN Tingling, Stephanie, PA-C       ascorbic acid (VITAMIN C) tablet 1,000 mg  1,000 mg Oral Daily Tingling, Stephanie, PA-C   1,000 mg at 03/21/24 0857   bictegravir-emtricitabine-tenofovir AF (BIKTARVY) 50-200-25 MG per tablet 1 tablet  1 tablet Oral Daily Tingling, Stephanie, PA-C   1 tablet at 03/21/24 0818   haloperidol (HALDOL) tablet 5 mg  5 mg Oral TID PRN Tingling, Stephanie, PA-C       And   diphenhydrAMINE (BENADRYL) capsule 50 mg  50 mg Oral TID PRN Tingling, Stephanie, PA-C   50 mg at 03/16/24 2205   haloperidol lactate (HALDOL) injection 5 mg  5 mg Intramuscular TID PRN Tingling, Stephanie, PA-C       And   diphenhydrAMINE (BENADRYL) injection 50 mg  50 mg Intramuscular TID PRN Tingling, Stephanie, PA-C       And   LORazepam (ATIVAN) injection 2 mg  2 mg Intramuscular TID PRN Tingling, Stephanie, PA-C       haloperidol lactate (HALDOL) injection 10 mg  10 mg Intramuscular TID PRN Tingling, Stephanie, PA-C       And   diphenhydrAMINE (BENADRYL) injection 50 mg  50 mg Intramuscular TID PRN Tingling, Stephanie, PA-C       And   LORazepam (ATIVAN) injection 2 mg  2 mg Intramuscular TID PRN Tingling, Stephanie, PA-C       docusate sodium (COLACE) capsule 100 mg  100 mg Oral BID Tingling, Stephanie, PA-C   100 mg at 03/21/24 0818   doxycycline (VIBRA-TABS) tablet 100 mg  100 mg Oral Q12H Myriam Forehand, NP   100 mg at 03/21/24  0857   hydrOXYzine (ATARAX) tablet 25 mg  25 mg Oral TID PRN Tingling, Stephanie, PA-C   25 mg at 03/18/24 2041   ibuprofen (ADVIL) tablet 800 mg  800 mg Oral Q6H PRN Tingling, Stephanie, PA-C   800 mg at 03/21/24 0916   insulin aspart (novoLOG) injection 0-6 Units  0-6 Units Subcutaneous TID WC Myriam Forehand, NP       magnesium hydroxide (MILK OF MAGNESIA) suspension 30 mL  30 mL Oral Daily PRN Tingling, Stephanie, PA-C       melatonin tablet 5 mg  5 mg Oral QHS Tingling, Stephanie, PA-C   5 mg at 03/20/24 2207   nystatin (MYCOSTATIN) 100000 UNIT/ML suspension 500,000 Units  5 mL Oral QID Tingling, Stephanie, PA-C   500,000 Units at 03/21/24 0808   OLANZapine zydis (ZYPREXA) disintegrating tablet 10 mg  10 mg Oral BID Myriam Forehand, NP   10 mg at 03/21/24 0857   propranolol (INDERAL) tablet 10 mg  10 mg Oral BID Myriam Forehand, NP   10 mg at 03/21/24 0818   PTA Medications: Medications Prior to Admission  Medication Sig Dispense Refill Last Dose/Taking   bictegravir-emtricitabine-tenofovir AF (BIKTARVY) 50-200-25 MG TABS tablet Take 1 tablet by mouth daily. 30 tablet 0 Past Month   ascorbic acid (VITAMIN C) 1000 MG tablet  Take 1 tablet (1,000 mg total) by mouth daily. (Patient not taking: Reported on 03/14/2024)   Not Taking   sulfamethoxazole-trimethoprim (BACTRIM DS) 800-160 MG tablet Take 1 tablet by mouth 2 (two) times daily. (Patient not taking: Reported on 03/14/2024) 10 tablet 0 Not Taking    Patient Stressors:    Patient Strengths:    Treatment Modalities: Medication Management, Group therapy, Case management,  1 to 1 session with clinician, Psychoeducation, Recreational therapy.   Physician Treatment Plan for Primary Diagnosis: Bipolar 1 disorder ([name]) Long Term Goal(s):     Short Term Goals:    Medication Management: Evaluate patient's response, side effects, and tolerance of medication regimen.  Therapeutic Interventions: 1 to 1 sessions, Unit Group sessions and Medication  administration.  Evaluation of Outcomes: Progressing  Physician Treatment Plan for Secondary Diagnosis: Principal Problem:   Bipolar 1 disorder ([name]) Active Problems:   AIDS ([name])   Paranoid schizophrenia ([name])   Wound infection after surgery  Long Term Goal(s):     Short Term Goals:       Medication Management: Evaluate patient's response, side effects, and tolerance of medication regimen.  Therapeutic Interventions: 1 to 1 sessions, Unit Group sessions and Medication administration.  Evaluation of Outcomes: Progressing   RN Treatment Plan for Primary Diagnosis: Bipolar 1 disorder ([name]) Long Term Goal(s): Knowledge of disease and therapeutic regimen to maintain health will improve  Short Term Goals: Ability to remain free from injury will improve, Ability to verbalize frustration and anger appropriately will improve, Ability to demonstrate self-control, Ability to participate in decision making will improve, Ability to verbalize feelings will improve, and Compliance with prescribed medications will improve  Medication Management: RN will administer medications as ordered by provider, will assess and evaluate patient's response and provide education to patient for prescribed medication. RN will report any adverse and/or side effects to prescribing provider.  Therapeutic Interventions: 1 on 1 counseling sessions, Psychoeducation, Medication administration, Evaluate responses to treatment, Monitor vital signs and CBGs as ordered, Perform/monitor CIWA, COWS, AIMS and Fall Risk screenings as ordered, Perform wound care treatments as ordered.  Evaluation of Outcomes: Progressing   LCSW Treatment Plan for Primary Diagnosis: Bipolar 1 disorder ([name]) Long Term Goal(s): Safe transition to appropriate next level of care at discharge, Engage patient in therapeutic group addressing interpersonal concerns.  Short Term Goals: Engage patient in aftercare planning with referrals and resources,  Increase social support, Increase ability to appropriately verbalize feelings, Increase emotional regulation, Facilitate acceptance of mental health diagnosis and concerns, Facilitate patient progression through stages of change regarding substance use diagnoses and concerns, Identify triggers associated with mental health/substance abuse issues, and Increase skills for wellness and recovery  Therapeutic Interventions: Assess for all discharge needs, 1 to 1 time with Social worker, Explore available resources and support systems, Assess for adequacy in community support network, Educate family and significant other(s) on suicide prevention, Complete Psychosocial Assessment, Interpersonal group therapy.  Evaluation of Outcomes: Progressing   Progress in Treatment: Attending groups: Yes. Participating in groups: Yes. Taking medication as prescribed: Yes. Toleration medication: Yes. Family/Significant other contact made: Marquette Saa, mother, [phone]  Patient understands diagnosis: Yes. Discussing patient identified problems/goals with staff: Yes. Medical problems stabilized or resolved: Yes. Denies suicidal/homicidal ideation: Yes. Issues/concerns per patient self-inventory: No. Other: None  New problem(s) identified: No, Describe:  none   New Short Term/Long Term Goal(s): detox, elimination of symptoms of psychosis, medication management for mood stabilization; elimination of SI thoughts; development of comprehensive mental wellness/sobriety plan. Update  03/21/24: No changes at this time.    Patient Goals:  "improve my mental health, my anxiety and my depression"  Update 03/21/24: No changes at this time.    Discharge Plan or Barriers: Patient that he will need housing assistance.  Patient will also need a wound and or surgical consult in regards to his hand.  Patient is agreeable to referral for mental health treatment at discharge. Update 03/21/24: After discharge the patient will go  to [name] [place].   Reason for Continuation of Hospitalization: Anxiety Depression Homicidal ideation Medication stabilization   Estimated Length of Stay:  1-7 days  Update 03/21/24: TBD  Last 3 [place] Suicide Severity Risk Score: Flowsheet Row Admission (Current) from 03/14/2024 in [facility] INPATIENT BEHAVIORAL MEDICINE Most recent reading at 03/14/2024  3:00 PM ED from 03/12/2024 in [facility] Emergency Department at [facility] Most recent reading at 03/12/2024 11:07 PM ED from 03/12/2024 in [facility] Most recent reading at 03/12/2024  2:15 PM  C-SSRS RISK CATEGORY No Risk No Risk No Risk       Last PHQ 2/9 Scores:    03/08/2023   10:52 AM 11/07/2022    2:03 PM  Depression screen PHQ 2/9  Decreased Interest 0 0  Down, Depressed, Hopeless 0 0  PHQ - 2 Score 0 0    Scribe for Treatment Team: Marshell Levan, LCSW 03/21/2024 10:47 AM

## 2024-03-21 NOTE — Group Note (Signed)
 Date:  03/21/2024 Time:  8:41 PM  Group Topic/Focus:  Orientation:   The focus of this group is to educate the patient on the purpose and policies of crisis stabilization and provide a format to answer questions about their admission.  The group details unit policies and expectations of patients while admitted. Wrap-Up Group:   The focus of this group is to help patients review their daily goal of treatment and discuss progress on daily workbooks.    Participation Level:  Active  Participation Quality:  Appropriate and Attentive  Affect:  Appropriate  Cognitive:  Alert and Appropriate  Insight: Appropriate  Engagement in Group:  Engaged  Modes of Intervention:  Discussion and Orientation  Additional Comments:     Maglione,Blane Worthington E 03/21/2024, 8:41 PM

## 2024-03-21 NOTE — Consult Note (Addendum)
 Initial Consultation Note   Patient: Mark Galloway ZOX:[id number] DOB: 14-Aug-1986 PCP: Pcp, No DOA: 03/14/2024 DOS: the patient was seen and examined on 03/21/2024 Primary service: Verner Chol, MD  Referring physician: Keith Rake Reason for consult: Right little finger infection  Assessment/Plan: Assessment and Plan:  Wound infection after surgery -Appears to have signs of worsening wound infection on the right middle finger with continuous intermittent wound bleeding and thick yellowish pus.  X-ray from 16th schooldays concerning for early osteomyelitis.  Will order MRI of the right middle finger to rule out osteomyelitis. -Reviewed on recent superficial culture showing MRSA, E. Coli, and Strep -Escalate ABX to Zyvox for now till MRI resolve -Consult wound care team   AIDS [facility]) Continue Biktarvy.  Last CD4 count 146.  Bipolar 1 disorder ([name]) Treatment as per psychiatry team.  Hyponatremia Sodium 1 point less than normal range.       TRH will continue to follow the patient.  HPI: Mark Galloway is a 38 y.o. male with past medical history of .  HIV on H ART, first presented to hospital on 2/21 725 with swelling and blister of the right hand was found to have a right middle finger flexor sheath infection and abscess subsequently underwent I&D on 2/22 and subsequently treated with IV antibiotics and discharge on 2/23 with Bactrim.  Patient subsequently admitted to behavioral health unit to treat bipolar disorder and on 3/18, patient was evaluated again by internal medicine and concerning for recurrent infection and Bactrim was restarted, and that yesterday patient completed Bactrim treatment however continue to experience pain associated with right mid finger and worsening of purulent discharge with intermittent bleeding.  Denied any fever chills.  Review of Systems: As mentioned in the history of present illness. All other systems reviewed and are negative. Past Medical  History:  Diagnosis Date   Bipolar 1 disorder ([name])    HIV infection ([name])    Schizophrenia ([name])    Shingles    Past Surgical History:  Procedure Laterality Date   I & D EXTREMITY Right 02/22/2024   Procedure: IRRIGATION AND DEBRIDEMENT EXTREMITY;  Surgeon: Betha Loa, MD;  Location: [facility] OR;  Service: Orthopedics;  Laterality: Right;   IRRIGATION AND DEBRIDEMENT FOOT Left    Social History:  reports that he has been smoking cigars and cigarettes. He has never used smokeless tobacco. He reports current alcohol use. He reports that he does not use drugs.  Allergies  Allergen Reactions   Peanut Oil Hives   Peanut-Containing Drug Products     History reviewed. No pertinent family history.  Prior to Admission medications   Medication Sig Start Date End Date Taking? Authorizing Provider  bictegravir-emtricitabine-tenofovir AF (BIKTARVY) 50-200-25 MG TABS tablet Take 1 tablet by mouth daily. 02/24/24  Yes Pokhrel, Laxman, MD  ascorbic acid (VITAMIN C) 1000 MG tablet Take 1 tablet (1,000 mg total) by mouth daily. Patient not taking: Reported on 03/14/2024 02/24/24   Joycelyn Das, MD  sulfamethoxazole-trimethoprim (BACTRIM DS) 800-160 MG tablet Take 1 tablet by mouth 2 (two) times daily. Patient not taking: Reported on 03/14/2024 02/24/24   Joycelyn Das, MD    Physical Exam: Vitals:   03/20/24 0626 03/20/24 1715 03/20/24 1804 03/21/24 0634  BP: 115/73 128/69 125/75 121/69  Pulse: 80  87 80  Resp: 14     Temp: 98.1 F (36.7 C)  97.9 F (36.6 C) 97.9 F (36.6 C)  TempSrc:      SpO2: 99%  100% 99%  Weight:  Height:       Eyes: PERRL, lids and conjunctivae normal ENMT: Mucous membranes are moist. Posterior pharynx clear of any exudate or lesions.Normal dentition.  Neck: normal, supple, no masses, no thyromegaly Respiratory: clear to auscultation bilaterally, no wheezing, no crackles. Normal respiratory effort. No accessory muscle use.  Cardiovascular: Regular rate and  rhythm, no murmurs / rubs / gallops. No extremity edema. 2+ pedal pulses. No carotid bruits.  Abdomen: no tenderness, no masses palpated. No hepatosplenomegaly. Bowel sounds positive.  Musculoskeletal: no clubbing / cyanosis. No joint deformity upper and lower extremities. Good ROM, no contractures. Normal muscle tone.  Skin: Large wound on right mid finger involving area from DIP to MCP on palm side with pus and spotted bleeding Neurologic: CN 2-12 grossly intact. Sensation intact, DTR normal.  Muscle strength 5/5 on both sides Psychiatric: Normal judgment and insight. Alert and oriented x 3. Normal mood.      Data Reviewed:  X-ray from 3/18 and recent labs and hospitalization record from last admission in February revealed  Family Communication: None at bedside Primary team communication: Psychiatry team Thank you very much for involving [place] in the care of your patient.  Author: Emeline General, MD 03/21/2024 12:13 PM  For on call review [email or web address].

## 2024-03-21 NOTE — Progress Notes (Signed)
   03/21/24 8469  Psych Admission Type (Psych Patients Only)  Admission Status Voluntary  Psychosocial Assessment  Patient Complaints Anxiety;Depression  Eye Contact Fair  Facial Expression Animated  Affect Appropriate to circumstance  Speech Logical/coherent  Interaction Assertive  Motor Activity Slow  Appearance/Hygiene Unremarkable  Behavior Characteristics Cooperative  Mood Anxious  Aggressive Behavior  Effect No apparent injury  Thought Process  Coherency WDL  Content WDL  Delusions None reported or observed  Perception WDL  Hallucination None reported or observed  Judgment Impaired  Confusion None  Danger to Self  Current suicidal ideation?  (Denies)  Self-Injurious Behavior No self-injurious ideation or behavior indicators observed or expressed   Agreement Not to Harm Self Yes  Description of Agreement verbal  Danger to Others  Danger to Others None reported or observed

## 2024-03-21 NOTE — Plan of Care (Signed)
 Pt denies SI/HI/AVH. Pain addressed w/PRN per MAR. Pt is pleasant and cooperative. Pt was offered support and encouragement. Pt was given scheduled medications  Pt was encourage to attend groups. Q 15 minute safety checks performed/ongoing. Pt attends groups and interacts well with peers and staff. Pt is taking medication. Pt concerned with finger dressing change and next steps addressed with provider. Pt receptive to treatment and safety maintained on unit.   Problem: Education: Goal: Knowledge of [facility] Education information/materials will improve Outcome: Progressing Goal: Emotional status will improve Outcome: Progressing Goal: Mental status will improve Outcome: Progressing Goal: Verbalization of understanding the information provided will improve Outcome: Progressing   Problem: Coping: Goal: Ability to verbalize frustrations and anger appropriately will improve Outcome: Progressing Goal: Ability to demonstrate self-control will improve Outcome: Progressing   Problem: Health Behavior/Discharge Planning: Goal: Identification of resources available to assist in meeting health care needs will improve Outcome: Progressing Goal: Compliance with treatment plan for underlying cause of condition will improve Outcome: Progressing   Problem: Metabolic: Goal: Ability to maintain appropriate glucose levels will improve Outcome: Progressing   Problem: Nutritional: Goal: Maintenance of adequate nutrition will improve Outcome: Progressing Goal: Progress toward achieving an optimal weight will improve Outcome: Progressing

## 2024-03-21 NOTE — Progress Notes (Signed)
 [name] MD Progress Note  03/21/2024 11:02 AM Mark Galloway  MRN:  [id number] Subjective:  38 year old African American Male,  presented to the behavioral health unit with an acute physical health concern, stating: "I am hurting on my toe and hand." He was observed to have signs of a worsening wound infection on the right middle finger, including intermittent bleeding and thick yellowish pus, consistent with an infectious process. He has not expressed suicidal or homicidal ideation, though his ability to fully engage in treatment may be limited due to pain and physical distress related to his infected wound. He remains cooperative with staff and compliant with evaluation but appears physically uncomfortable and preoccupied with his medical issues.Newly noted necrotic wound with associated edemaAppearance is concerning for possible ischemic changes, infection, or cellulitisFurther vascular and wound care consultation recommended Principal Problem: Bipolar 1 disorder ([name]) Diagnosis: Principal Problem:   Bipolar 1 disorder ([name]) Active Problems:   AIDS ([name])   Paranoid schizophrenia ([name])   Wound infection after surgery   Finger osteomyelitis ([name])  Total Time spent with patient: 2.5 hours  Past Psychiatric History: see below  Past Medical History:  Past Medical History:  Diagnosis Date   Bipolar 1 disorder ([name])    HIV infection ([name])    Schizophrenia ([name])    Shingles     Past Surgical History:  Procedure Laterality Date   I & D EXTREMITY Right 02/22/2024   Procedure: IRRIGATION AND DEBRIDEMENT EXTREMITY;  Surgeon: Betha Loa, MD;  Location: [facility] OR;  Service: Orthopedics;  Laterality: Right;   IRRIGATION AND DEBRIDEMENT FOOT Left    Family History: History reviewed. No pertinent family history. Family Psychiatric  History: none reported Social History:  Social History   Substance and Sexual Activity  Alcohol Use Yes   Comment: 3-4 per week     Social History   Substance and  Sexual Activity  Drug Use Never    Social History   Socioeconomic History   Marital status: Single    Spouse name: Not on file   Number of children: 0   Years of education: 16   Highest education level: Not on file  Occupational History   Occupation: Public relations account executive  Tobacco Use   Smoking status: Some Days    Types: Cigars, Cigarettes   Smokeless tobacco: Never   Tobacco comments:    2 per week  Vaping Use   Vaping status: Never Used  Substance and Sexual Activity   Alcohol use: Yes    Comment: 3-4 per week   Drug use: Never   Sexual activity: Not Currently    Comment: accepted condoms  Other Topics Concern   Not on file  Social History Narrative   Not on file   Social Drivers of Health   Financial Resource Strain: High Risk (01/04/2024)   Received from [facility]   Overall Financial Resource Strain (CARDIA)    Difficulty of Paying Living Expenses: Very hard  Food Insecurity: Food Insecurity Present (03/14/2024)   Hunger Vital Sign    Worried About Running Out of Food in the Last Year: Sometimes true    Ran Out of Food in the Last Year: Sometimes true  Transportation Needs: No Transportation Needs (03/14/2024)   PRAPARE - Administrator, Civil Service (Medical): No    Lack of Transportation (Non-Medical): No  Recent Concern: Transportation Needs - Unmet Transportation Needs (02/23/2024)   PRAPARE - Transportation    Lack of Transportation (Medical): Yes    Lack  of Transportation (Non-Medical): Yes  Physical Activity: Not on file  Stress: Not on file  Social Connections: Unknown (05/04/2022)   Received from [facility]   Social Network    Social Network: Not on file   Additional Social History:                         Sleep: Good  Appetite:  Good  Current Medications: Current Facility-Administered Medications  Medication Dose Route Frequency Provider Last Rate Last Admin   alum & mag hydroxide-simeth  (MAALOX/MYLANTA) 200-200-20 MG/5ML suspension 30 mL  30 mL Oral Q4H PRN Tingling, Stephanie, PA-C       ascorbic acid (VITAMIN C) tablet 1,000 mg  1,000 mg Oral Daily Tingling, Stephanie, PA-C   1,000 mg at 03/21/24 0857   bictegravir-emtricitabine-tenofovir AF (BIKTARVY) 50-200-25 MG per tablet 1 tablet  1 tablet Oral Daily Tingling, Stephanie, PA-C   1 tablet at 03/21/24 0818   haloperidol (HALDOL) tablet 5 mg  5 mg Oral TID PRN Tingling, Judeth Cornfield, PA-C       And   diphenhydrAMINE (BENADRYL) capsule 50 mg  50 mg Oral TID PRN Tingling, Stephanie, PA-C   50 mg at 03/16/24 2205   haloperidol lactate (HALDOL) injection 5 mg  5 mg Intramuscular TID PRN Tingling, Stephanie, PA-C       And   diphenhydrAMINE (BENADRYL) injection 50 mg  50 mg Intramuscular TID PRN Tingling, Stephanie, PA-C       And   LORazepam (ATIVAN) injection 2 mg  2 mg Intramuscular TID PRN Tingling, Stephanie, PA-C       haloperidol lactate (HALDOL) injection 10 mg  10 mg Intramuscular TID PRN Tingling, Stephanie, PA-C       And   diphenhydrAMINE (BENADRYL) injection 50 mg  50 mg Intramuscular TID PRN Tingling, Stephanie, PA-C       And   LORazepam (ATIVAN) injection 2 mg  2 mg Intramuscular TID PRN Tingling, Stephanie, PA-C       docusate sodium (COLACE) capsule 100 mg  100 mg Oral BID Tingling, Stephanie, PA-C   100 mg at 03/21/24 1651   hydrOXYzine (ATARAX) tablet 25 mg  25 mg Oral TID PRN Tingling, Stephanie, PA-C   25 mg at 03/18/24 2041   ibuprofen (ADVIL) tablet 800 mg  800 mg Oral Q6H PRN Tingling, Stephanie, PA-C   800 mg at 03/21/24 0916   insulin aspart (novoLOG) injection 0-6 Units  0-6 Units Subcutaneous TID WC Myriam Forehand, NP       linezolid (ZYVOX) tablet 600 mg  600 mg Oral Q12H Mikey College T, MD       magnesium hydroxide (MILK OF MAGNESIA) suspension 30 mL  30 mL Oral Daily PRN Tingling, Stephanie, PA-C       melatonin tablet 5 mg  5 mg Oral QHS Tingling, Stephanie, PA-C   5 mg at 03/20/24 2207   nystatin  (MYCOSTATIN) 100000 UNIT/ML suspension 500,000 Units  5 mL Oral QID Tingling, Stephanie, PA-C   500,000 Units at 03/21/24 1655   OLANZapine zydis (ZYPREXA) disintegrating tablet 10 mg  10 mg Oral BID Myriam Forehand, NP   10 mg at 03/21/24 0857   propranolol (INDERAL) tablet 10 mg  10 mg Oral BID Myriam Forehand, NP   10 mg at 03/21/24 1656    Lab Results:  Results for orders placed or performed during the hospital encounter of 03/14/24 (from the past 48 hours)  Glucose,  capillary     Status: Abnormal   Collection Time: 03/20/24  9:43 PM  Result Value Ref Range   Glucose-Capillary 238 (H) 70 - 99 mg/dL    Comment: Glucose reference range applies only to samples taken after fasting for at least 8 hours.  Glucose, capillary     Status: Abnormal   Collection Time: 03/21/24  8:14 AM  Result Value Ref Range   Glucose-Capillary 114 (H) 70 - 99 mg/dL    Comment: Glucose reference range applies only to samples taken after fasting for at least 8 hours.  Glucose, capillary     Status: Abnormal   Collection Time: 03/21/24 11:41 AM  Result Value Ref Range   Glucose-Capillary 118 (H) 70 - 99 mg/dL    Comment: Glucose reference range applies only to samples taken after fasting for at least 8 hours.  CBC     Status: Abnormal   Collection Time: 03/21/24  1:01 PM  Result Value Ref Range   WBC 7.4 4.0 - 10.5 K/uL   RBC 3.41 (L) 4.22 - 5.81 MIL/uL   Hemoglobin 11.4 (L) 13.0 - 17.0 g/dL   HCT 16.1 (L) 09.6 - 04.5 %   MCV 97.4 80.0 - 100.0 fL   MCH 33.4 26.0 - 34.0 pg   MCHC 34.3 30.0 - 36.0 g/dL   RDW 40.9 81.1 - 91.4 %   Platelets 317 150 - 400 K/uL   nRBC 0.0 0.0 - 0.2 %    Comment: Performed at [facility], [address] [place], [place] [address]  Glucose, capillary     Status: None   Collection Time: 03/21/24  4:51 PM  Result Value Ref Range   Glucose-Capillary 95 70 - 99 mg/dL    Comment: Glucose reference range applies only to samples taken after fasting for at least 8 hours.     Blood Alcohol level:  Lab Results  Component Value Date   ETH <10 10/01/2022   ETH <10 03/25/2020    Metabolic Disorder Labs: Lab Results  Component Value Date   HGBA1C 5.1 03/15/2024   MPG 99.67 03/15/2024   No results found for: "PROLACTIN" Lab Results  Component Value Date   CHOL 108 03/15/2024   TRIG 106 03/15/2024   HDL 39 (L) 03/15/2024   CHOLHDL 2.8 03/15/2024   VLDL 21 03/15/2024   LDLCALC 48 03/15/2024   LDLCALC 40 11/07/2022    Physical Findings: AIMS:  , ,  ,  ,    CIWA:    COWS:     Musculoskeletal: Strength & Muscle Tone: within normal limits Gait & Station: normal Patient leans: N/A  Psychiatric Specialty Exam:  Presentation  General Appearance:  Appropriate for Environment (Calm and cooperative; focused on physical concerns)  Eye Contact: Good  Speech: Clear and Coherent; Normal Rate  Speech Volume: Normal  Handedness: Right   Mood and Affect  Mood: Anxious ("Worried" - expressed concern about limb wounds)  Affect: Constricted; Congruent   Thought Process  Thought Processes: Linear; Goal Directed  Descriptions of Associations:Intact  Orientation:Full (Time, Place and Person)  Thought Content:-- (Preoccupied with medical condition)  History of Schizophrenia/Schizoaffective disorder:Yes  Duration of Psychotic Symptoms:Greater than six months  Hallucinations:Hallucinations: None Description of Command Hallucinations: denies  Ideas of Reference:None  Suicidal Thoughts:Suicidal Thoughts: No  Homicidal Thoughts:Homicidal Thoughts: No HI Active Intent and/or Plan: -- (denies) HI Passive Intent and/or Plan: -- (denies)   Sensorium  Memory: Immediate Good; Recent Good; Remote Good  Judgment: Intact (Intact regarding  need for help and safety)  Insight: Fair (understands need for medical follow-up)   Executive Functions  Concentration: Fair  Attention Span: Fair  Recall: Good  Fund of  Knowledge: Good  Language: Good   Psychomotor Activity  Psychomotor Activity: Psychomotor Activity: Normal   Assets  Assets: Communication Skills; Desire for Improvement; Resilience; Social Support   Sleep  Sleep: Sleep: Good Number of Hours of Sleep: 6    Physical Exam: Physical Exam Vitals and nursing note reviewed.  Constitutional:      Appearance: Normal appearance.  HENT:     Head: Normocephalic and atraumatic.     Nose: Nose normal.  Pulmonary:     Effort: Pulmonary effort is normal.  Musculoskeletal:     Cervical back: Normal range of motion.  Skin:    Comments: Right hand wound with suspected osteomyelitis (MRI pending)  Left foot necrotic wound and edema  Neurological:     General: No focal deficit present.     Mental Status: He is alert. Mental status is at baseline.  Psychiatric:        Attention and Perception: Attention and perception normal.        Mood and Affect: Mood is anxious. Affect is labile.        Speech: Speech normal.        Behavior: Behavior normal. Behavior is cooperative.        Thought Content: Thought content normal.        Cognition and Memory: Cognition and memory normal.        Judgment: Judgment is impulsive.    Review of Systems  Skin:        Right hand wound with suspected osteomyelitis (MRI pending)  Left foot necrotic wound and edema  Psychiatric/Behavioral:  Positive for substance abuse. The patient is nervous/anxious.   All other systems reviewed and are negative.  Blood pressure (!) 130/103, pulse 91, temperature 98.4 F (36.9 C), temperature source Oral, resp. rate 18, height 5\' 9"  (1.753 m), weight 72.1 kg, SpO2 100%. Body mass index is 23.48 kg/m.   Treatment Plan Summary: Daily contact with patient to assess and evaluate symptoms and progress in treatment and Medication management Increase Zyprexa (Olanzapine) 10 mg PO QHS - Increased from previous dose for stabilization of psychotic sym Medical and  Ortho consult pending to determine appropriate antibiotic management Wound care team to assess left foot necrosis and right hand drainage CBG monitoring initiated to assess for hyperglycemia or infection-related metabolic disturbance Sulfamethoxazole-Trimethoprim (Bactrim DS) 800-160 mg - 1 tablet twice daily for wound infection prophylaxis/treatment. Inderal (Propranolol) 10 mg - Twice daily for anxiety, tremors, or cardiovascular support Continue Hydroxyzine 25 mg TID PRN for anxiety. Melatonin 5 mg HS for sleep aid.  Myriam Forehand, NP Myriam Forehand, NP 03/21/2024, 5:52 PM

## 2024-03-22 ENCOUNTER — Inpatient Hospital Stay: Payer: Self-pay

## 2024-03-22 DIAGNOSIS — F319 Bipolar disorder, unspecified: Secondary | ICD-10-CM | POA: Diagnosis not present

## 2024-03-22 LAB — AEROBIC/ANAEROBIC CULTURE W GRAM STAIN (SURGICAL/DEEP WOUND)

## 2024-03-22 LAB — CBC
HCT: 33.7 % — ABNORMAL LOW (ref 39.0–52.0)
Hemoglobin: 11.4 g/dL — ABNORMAL LOW (ref 13.0–17.0)
MCH: 32.9 pg (ref 26.0–34.0)
MCHC: 33.8 g/dL (ref 30.0–36.0)
MCV: 97.1 fL (ref 80.0–100.0)
Platelets: 306 10*3/uL (ref 150–400)
RBC: 3.47 MIL/uL — ABNORMAL LOW (ref 4.22–5.81)
RDW: 14.6 % (ref 11.5–15.5)
WBC: 4.6 10*3/uL (ref 4.0–10.5)
nRBC: 0 % (ref 0.0–0.2)

## 2024-03-22 LAB — BASIC METABOLIC PANEL
Anion gap: 7 (ref 5–15)
BUN: 13 mg/dL (ref 6–20)
CO2: 23 mmol/L (ref 22–32)
Calcium: 9.1 mg/dL (ref 8.9–10.3)
Chloride: 104 mmol/L (ref 98–111)
Creatinine, Ser: 0.72 mg/dL (ref 0.61–1.24)
GFR, Estimated: >60 mL/min (ref >60)
Glucose, Bld: 151 mg/dL — ABNORMAL HIGH (ref 70–99)
Potassium: 4 mmol/L (ref 3.5–5.1)
Sodium: 134 mmol/L — ABNORMAL LOW (ref 135–145)

## 2024-03-22 LAB — GLUCOSE, CAPILLARY
Glucose-Capillary: 159 mg/dL — ABNORMAL HIGH (ref 70–99)
Glucose-Capillary: 158 mg/dL — ABNORMAL HIGH (ref 70–99)
Glucose-Capillary: 115 mg/dL — ABNORMAL HIGH (ref 70–99)
Glucose-Capillary: 121 mg/dL — ABNORMAL HIGH (ref 70–99)

## 2024-03-22 NOTE — Group Note (Signed)
 Date:  03/22/2024 Time:  7:13 PM  Group Topic/Focus:  Activity Group: The focus of the group is to promote activity for the patients and encourage them to go outside to the courtyard and get some fresh air and some exercise.    Participation Level:  Active  Participation Quality:  Appropriate  Affect:  Appropriate  Cognitive:  Appropriate  Insight: Appropriate  Engagement in Group:  Engaged  Modes of Intervention:  Activity  Additional Comments:    Mark Galloway 03/22/2024, 7:13 PM

## 2024-03-22 NOTE — Progress Notes (Signed)
 Nursing Shift Note:  1900-0700  Attended Evening Group: yes Medication Compliant:  yes Behavior: cooperative Sleep Quality: fair Significant Changes: medical consult completed on 1st shift; pt now on two antibiotics; pt was calm and cooperative for all procedures including xray of foot and blood cultures.  Pt denies SI HI AVH.  Continued monitoring for safety.    03/22/24 0700  Psych Admission Type (Psych Patients Only)  Admission Status Voluntary  Psychosocial Assessment  Patient Complaints Anxiety  Eye Contact Fair  Facial Expression Animated  Affect Appropriate to circumstance  Speech Logical/coherent  Interaction Assertive  Motor Activity Slow  Appearance/Hygiene Unremarkable  Behavior Characteristics Cooperative  Mood Anxious  Thought Process  Coherency WDL  Content WDL  Delusions None reported or observed  Perception WDL  Hallucination None reported or observed  Judgment Impaired  Confusion None  Danger to Self  Current suicidal ideation? Denies  Self-Injurious Behavior No self-injurious ideation or behavior indicators observed or expressed   Agreement Not to Harm Self Yes  Description of Agreement verbal  Danger to Others  Danger to Others None reported or observed

## 2024-03-22 NOTE — Group Note (Signed)
 Date:  03/22/2024 Time:  7:07 PM  Group Topic/Focus:  Goals Group:   The focus of this group is to help patients establish daily goals to achieve during treatment and discuss how the patient can incorporate goal setting into their daily lives to aide in recovery.    Participation Level:  Active  Participation Quality:  Appropriate  Affect:  Appropriate  Cognitive:  Appropriate  Insight: Appropriate  Engagement in Group:  Engaged  Modes of Intervention:  Activity  Additional Comments:    Mark Galloway 03/22/2024, 7:07 PM

## 2024-03-22 NOTE — Progress Notes (Addendum)
 Assisted pt to MRI for right finger scan. Pt ambulated to MRI staff X2, and then wheeled to first floor. This nurse accompanied the pt and waited until procedure was completed. Pt wheeled back to BMU and remained pleasant, cooperative, and receptive to treatment and safety maintained on unit.

## 2024-03-22 NOTE — Progress Notes (Addendum)
 Patient denies SI/HI/AH/VH. Patient did endorse anxiety and pain during this Rns shift- PRNs given- reference MAR. Patient went for MRI of foot today- tolerated well, but reported increased pain in foot after completion of scan- tylenol was given (see in [facility]). Patient denies any other needs, besides being worried about his hand and foot. Wound care was provided by this RN during shift according to wound care nurse recommendations. Per wound care nurse, they are changing recommendations for cleaning and will be updating orders.

## 2024-03-22 NOTE — Progress Notes (Signed)
 [facility] Progress Note  Patient: Mark Galloway    WUJ:[id number]  DOA: 03/14/2024     Date of Service: the patient was seen and examined on 03/22/2024  No chief complaint on file.  Brief hospital course: Mark Galloway is an 38 y.o. male with PMH of  HIV on HART, bipolar disorder, paranoid schizophrenia, first presented to hospital on 02/21/24 with swelling and blister of the right hand was found to have a right middle finger flexor sheath infection and abscess subsequently underwent I&D on 2/22 and subsequently treated with IV antibiotics and discharge on 2/23 with Bactrim.  Patient subsequently admitted to behavioral health unit to treat bipolar disorder on 3/18. Patient was evaluated again by internal medicine and concerning for recurrent infection and Bactrim was restarted, and patient completed Bactrim treatment however continue to experience pain associated with right mid finger and worsening of purulent discharge with intermittent bleeding.    Findings of X-ray from [facility] are concerning for early osteomyelitis. Recent superficial culture showing MRSA, E. Coli, and Strep. Dr. Mikey College from our group did consult today 3/22 and escalate ABX to Zyvox. MRI of right fingers pending. Consulted Dr. Hyacinth Meeker of ortho.    I am asked by [facility] NP to see pt again this evening 3/22, due to left foot pain and elevate Bp. His Bp is 116/68. Pt has bloody blister over the dorsum of the MP joint of the left great toe, with surrounding erythema in left foot. Pt has mild tenderness in left foot.  Denies fever and chills.  Patient does not have chest pain, cough, SOB.  No nausea, vomiting, diarrhea or abdominal pain.  No symptoms of UTI.  Patient denies IV drug use.   Date reviewed, lab, image and vitals: WBC 7.4, temperature normal, blood pressure 116/68, heart rate 92, RR 18.    EKG: done on 3/18. I reviewed EKG independently.  Sinus rhythm, QTc 397, no ischemic change.     Assessment and Plan:  #  Osteomyelitis right little finger pt has signs of worsening wound infection on the right middle finger with continuous intermittent wound bleeding and thick yellowish pus.   [facility] from [facility] concerning for early osteomyelitis. Recent superficial culture showing MRSA, E. Coli, and Strep. Pt is switched to zyvox by Dr. Chipper Herb.  Patient does not have fever or leukocytosis.  Clinically not septic.  Consulted Dr. Hyacinth Meeker of ortho.   -continue Zyvox and Augmentin -pending MRI of the right middle finger -Consult wound care team MRI shows osteomyelitis and possible abscess along with cellulitis and myositis.  Awaiting for input from orthopedic surgery for further management, sent a secure chat about MRI report.   Cellulitis of foot, left: Patient has bloody blister in the right foot with surrounding erythema, indicating cellulitis.  Patient likely had injury which he was not aware of it.  Patient does not have fever or leukocytosis, not septic. -Will continue Zyvox -will add Augmentin bid -will get Blood culture -check CRP and ESR -X-ray of left foot showed soft tissue swelling.,  No fracture.  3/23 podiatry consulted, recommended MRI left foot.   AIDS ([name]) -Continue Biktarvy.  Last CD4 count 146.   Bipolar 1 disorder and paranoid schizophrenia: Patient is calm now. Treatment as per psychiatry team.   HTN: [facility] NP Keith Rake told me that pt has elevated blood pressure, but his blood pressure is 116/68 as documented in epic.  -Patient is propranolol 10 mg twice daily currently. -Will add prn oral hydralazine 50 mg  every 2-hour for SBP > 160     Body mass index is 23.48 kg/m.  Interventions:  Diet: Carb modified diet DVT Prophylaxis: Ambulatory no need, low risk  Advance goals of care discussion: Full code  Family Communication: family was not present at bedside, at the time of interview.  The pt provided permission to discuss medical plan with the family. Opportunity was given  to ask question and all questions were answered satisfactorily.   Disposition:  Pt is from home, admitted with bipolar disorder at [facility], has right little finger infection and left foot infection so [facility] was consulted. Orthopedics is following for right little finger infection, follow MRI report Podiatry consulted for left foot infection, may need I&D, podiatry ordered MRI which is pending   Subjective: No significant overnight events, patient was seen in the [facility], complaining of pain in the left foot and swelling, but patient is able to ambulate.  Right little finger dressing was changed by RN, did not open to see the wound.  We will continue above management.  Physical Exam: General: NAD, lying comfortably Appear in no distress, affect appropriate Eyes: PERRLA ENT: Oral Mucosa Clear, moist  Neck: no JVD,  Cardiovascular: S1 and S2 Present, no Murmur,  Respiratory: good respiratory effort, Bilateral Air entry equal and Decreased, no Crackles, no wheezes Abdomen: Bowel Sound present, Soft and no tenderness,  Skin: no rashes Extremities: no Pedal edema, no calf tenderness Neurologic: without any new focal findings Gait not checked due to patient safety concerns  Vitals:   03/21/24 0634 03/21/24 1655 03/21/24 1825 03/22/24 0815  BP: 121/69 (!) 130/103 116/68 128/70  Pulse: 80 91 92 100  Resp:  18  20  Temp: 97.9 F (36.6 C) 98.4 F (36.9 C)  98 F (36.7 C)  TempSrc:  Oral  Oral  SpO2: 99% 100% 100% 98%  Weight:      Height:       No intake or output data in the 24 hours ending 03/22/24 1552 Filed Weights   03/14/24 1500  Weight: 72.1 kg    Data Reviewed: I have personally reviewed and interpreted daily labs, tele strips, imagings as discussed above. I reviewed all nursing notes, pharmacy notes, vitals, pertinent old records I have discussed plan of care as described above with RN and patient/family.  CBC: Recent Labs  Lab 03/18/24 0641 03/21/24 1301 03/22/24 0645  WBC  3.7* 7.4 4.6  NEUTROABS 1.0*  --   --   HGB 11.7* 11.4* 11.4*  HCT 34.7* 33.2* 33.7*  MCV 96.4 97.4 97.1  PLT 301 317 306   Basic Metabolic Panel: Recent Labs  Lab 03/18/24 0641 03/22/24 0645  NA 135 134*  K 4.3 4.0  CL 105 104  CO2 24 23  GLUCOSE 108* 151*  BUN 13 13  CREATININE 0.84 0.72  CALCIUM 9.3 9.1    Studies: MR FINGERS RICHT WO CONTRAST Result Date: 03/22/2024 CLINICAL DATA:  Worsening wound infection of the finger with purulent drainage EXAM: MRI OF THE RIGHT FINGERS WITHOUT CONTRAST TECHNIQUE: Multiplanar, multisequence MR imaging of the right fingers was performed. No intravenous contrast was administered. COMPARISON:  Radiographs 03/17/2024 FINDINGS: Bones/Joint/Cartilage Substantial marrow edema in the left middle phalanx small finger compatible with osteomyelitis. Subtle marrow edema in the base of the distal phalanx may reflect early osteomyelitis. Joint effusion of the small digit proximal interphalangeal joint, septic arthritis not excluded. Ligaments Grossly unremarkable Muscles and Tendons Severe indistinctness and attenuation of the distal flexor digiti minimi, with  the tendon displaced away from the bony structures at the middle phalangeal level and distal proximal phalangeal level. Flexor tendon tear not excluded although some of the indistinctness may be due to severe inflammation. Suspected loss integrity of various pulleys in the region including the A2, C1, A3, C2, and A4 pulleys given the displacement of the tendon away from the bony structures. Low-grade edema in the carpal tunnel. Abnormal edema suggesting myositis in the dorsal and palmar interosseous muscles adjacent to the fifth metacarpal. Soft tissues Severe inflammatory findings and edema signal volar to the proximal and middle phalanges of the small finger encompassing a 4.6 by 1.8 by 1.2 cm region, suspicious for potential abscess, with fluid signal draining along the medial margin of the flexor tendon  towards the volar-medial surface of the finger, probably a draining ulcer. Wound noted along the volar fifth finger. Diffuse subcutaneous edema in the fifth finger extending into the dorsum of the hand and partially into the fourth finger probably from cellulitis. IMPRESSION: 1. Osteomyelitis involving the middle phalanx and base of the distal phalanx small finger. 2. Severe inflammatory findings and edema signal volar to the proximal and middle phalanges of the small finger encompassing a 4.6 by 1.8 by 1.2 cm region, suspicious for potential abscess, with fluid signal draining along the medial margin of the flexor tendon towards the volar-medial surface of the finger, probably a draining ulcer. 3. Joint effusion of the small digit proximal interphalangeal joint, septic arthritis not excluded. 4. Severe indistinctness and attenuation of the distal flexor digiti minimi, with the tendon displaced away from the bony structures at the middle phalangeal level and distal proximal phalangeal level. Flexor tendon tear not excluded although some of the indistinctness may be due to severe inflammation. Suspected loss integrity of various pulleys in the region including the A2, C1, C3, C2, and A4 pulleys given the displacement of the tendon away from the bony structures. 5. Abnormal edema suggesting myositis in the dorsal and palmar interosseous muscles adjacent to the fifth metacarpal. 6. Diffuse subcutaneous edema in the fifth finger extending into the dorsum of the hand and partially into the fourth finger probably from cellulitis. 7. Low-grade edema in the carpal tunnel. Electronically Signed   By: Gaylyn Rong M.D.   On: 03/22/2024 12:51   DG Foot 2 Views Left Result Date: 03/21/2024 CLINICAL DATA:  Foot cellulitis EXAM: LEFT FOOT - 2 VIEW COMPARISON:  None Available. FINDINGS: There is no evidence of fracture or dislocation. Mild degenerative change at the first MTP joint. Protuberant soft tissue swelling at the  distal dorsum of the foot. No soft tissue emphysema or radiopaque foreign body IMPRESSION: No acute osseous abnormality. Protuberant soft tissue swelling at the distal dorsum of the foot. Electronically Signed   By: Jasmine Pang M.D.   On: 03/21/2024 23:55    Scheduled Meds:  amoxicillin-clavulanate  1 tablet Oral Q12H   ascorbic acid  1,000 mg Oral Daily   bictegravir-emtricitabine-tenofovir AF  1 tablet Oral Daily   docusate sodium  100 mg Oral BID   insulin aspart  0-6 Units Subcutaneous TID WC   linezolid  600 mg Oral Q12H   melatonin  5 mg Oral QHS   nicotine  21 mg Transdermal Daily   nystatin  5 mL Oral QID   OLANZapine zydis  10 mg Oral BID   propranolol  10 mg Oral BID   Continuous Infusions: PRN Meds: acetaminophen, alum & mag hydroxide-simeth, haloperidol **AND** diphenhydrAMINE, haloperidol lactate **AND** diphenhydrAMINE **AND**  LORazepam, haloperidol lactate **AND** diphenhydrAMINE **AND** LORazepam, hydrALAZINE, hydrOXYzine, ibuprofen, magnesium hydroxide  Time spent: 35 minutes  Author: Gillis Santa. MD [facility] 03/22/2024 3:52 PM  To reach On-call, see care teams to locate the attending and reach out to them via [email or web address]. If 7PM-7AM, please contact night-coverage If you still have difficulty reaching the attending provider, please page the [facility] (Director on Call) for [facility] on amion for assistance.

## 2024-03-22 NOTE — Plan of Care (Addendum)
   Problem: Education: Goal: Knowledge of [facility] Education information/materials will improve Outcome: Progressing Goal: Emotional status will improve Outcome: Progressing Goal: Mental status will improve Outcome: Progressing Goal: Verbalization of understanding the information provided will improve Outcome: Progressing

## 2024-03-22 NOTE — Consult Note (Addendum)
 WOC consulted for finger wound, please see previous notes and new orthopedic consult note. WOC will sign off   Re consult if needed, will not follow at this time. Thanks  Janeth Terry [employer], RN,CWOCN, CNS, CWON-AP [phone])

## 2024-03-22 NOTE — Progress Notes (Signed)
 [facility] MD Progress Note  03/22/2024 11:23 AM Mark Galloway  MRN:  [id number] Subjective:   38 year old African American male,with a history of Bipolar Disorder, currently receiving inpatient behavioral health treatment. He is also being followed by the medical hospitalist team for active cellulitis of the left foot and osteomyelitis of the right little finger, and has recently developed signs of a worsening wound infection in the right middle finger, including intermittent bleeding and thick yellowish discharge, indicative of an ongoing infectious process. A podiatry consult is pending.The patient remains medically stable on oral antibiotics, but reports persistent pain and anxiety related to the status of his foot and hand. He has voiced concerns, stating: "When are they going to do something?" Though he has not expressed suicidal or homicidal ideation, his ability to fully participate in behavioral health treatment is impacted by physical discomfort and preoccupation with his medical condition.The patient is anxious, verbal, and actively seeking updates on his care. He remains cooperative with staff and compliant with medications, but his psychiatric engagement is currently limited by somatic focus.   Principal Problem: Bipolar 1 disorder ([name]) Diagnosis: Principal Problem:   Bipolar 1 disorder ([name]) Active Problems:   AIDS ([name])   Paranoid schizophrenia ([name])   Finger osteomyelitis ([name])   Cellulitis of foot, left   HTN (hypertension)  Total Time spent with patient: 2.5 hours  Past Psychiatric History: see below  Past Medical History:  Past Medical History:  Diagnosis Date   Bipolar 1 disorder ([name])    HIV infection ([name])    Schizophrenia ([name])    Shingles     Past Surgical History:  Procedure Laterality Date   I & D EXTREMITY Right 02/22/2024   Procedure: IRRIGATION AND DEBRIDEMENT EXTREMITY;  Surgeon: Betha Loa, MD;  Location: [facility] OR;  Service: Orthopedics;  Laterality: Right;    IRRIGATION AND DEBRIDEMENT FOOT Left    Family History: History reviewed. No pertinent family history. Family Psychiatric  History: none reported Social History:  Social History   Substance and Sexual Activity  Alcohol Use Yes   Comment: 3-4 per week     Social History   Substance and Sexual Activity  Drug Use Never    Social History   Socioeconomic History   Marital status: Single    Spouse name: Not on file   Number of children: 0   Years of education: 16   Highest education level: Not on file  Occupational History   Occupation: Public relations account executive  Tobacco Use   Smoking status: Some Days    Types: Cigars, Cigarettes   Smokeless tobacco: Never   Tobacco comments:    2 per week  Vaping Use   Vaping status: Never Used  Substance and Sexual Activity   Alcohol use: Yes    Comment: 3-4 per week   Drug use: Never   Sexual activity: Not Currently    Comment: accepted condoms  Other Topics Concern   Not on file  Social History Narrative   Not on file   Social Drivers of Health   Financial Resource Strain: High Risk (01/04/2024)   Received from [facility]   Overall Financial Resource Strain (CARDIA)    Difficulty of Paying Living Expenses: Very hard  Food Insecurity: Food Insecurity Present (03/14/2024)   Hunger Vital Sign    Worried About Running Out of Food in the Last Year: Sometimes true    Ran Out of Food in the Last Year: Sometimes true  Transportation Needs: No Transportation Needs (03/14/2024)  PRAPARE - Administrator, Civil Service (Medical): No    Lack of Transportation (Non-Medical): No  Recent Concern: Transportation Needs - Unmet Transportation Needs (02/23/2024)   PRAPARE - Administrator, Civil Service (Medical): Yes    Lack of Transportation (Non-Medical): Yes  Physical Activity: Not on file  Stress: Not on file  Social Connections: Unknown (05/04/2022)   Received from [facility]   Social Network     Social Network: Not on file   Additional Social History:                         Sleep: Good  Appetite:  Good  Current Medications: Current Facility-Administered Medications  Medication Dose Route Frequency Provider Last Rate Last Admin   acetaminophen (TYLENOL) tablet 650 mg  650 mg Oral Q6H PRN Lorretta Harp, MD   650 mg at 03/22/24 1424   alum & mag hydroxide-simeth (MAALOX/MYLANTA) 200-200-20 MG/5ML suspension 30 mL  30 mL Oral Q4H PRN Tingling, Stephanie, PA-C       amoxicillin-clavulanate (AUGMENTIN) 875-125 MG per tablet 1 tablet  1 tablet Oral Q12H Lorretta Harp, MD   1 tablet at 03/22/24 1884   ascorbic acid (VITAMIN C) tablet 1,000 mg  1,000 mg Oral Daily Tingling, Stephanie, PA-C   1,000 mg at 03/22/24 0815   bictegravir-emtricitabine-tenofovir AF (BIKTARVY) 50-200-25 MG per tablet 1 tablet  1 tablet Oral Daily Tingling, Stephanie, PA-C   1 tablet at 03/22/24 1660   haloperidol (HALDOL) tablet 5 mg  5 mg Oral TID PRN Tingling, Judeth Cornfield, PA-C       And   diphenhydrAMINE (BENADRYL) capsule 50 mg  50 mg Oral TID PRN Tingling, Stephanie, PA-C   50 mg at 03/16/24 2205   haloperidol lactate (HALDOL) injection 5 mg  5 mg Intramuscular TID PRN Tingling, Stephanie, PA-C       And   diphenhydrAMINE (BENADRYL) injection 50 mg  50 mg Intramuscular TID PRN Tingling, Stephanie, PA-C       And   LORazepam (ATIVAN) injection 2 mg  2 mg Intramuscular TID PRN Tingling, Stephanie, PA-C       haloperidol lactate (HALDOL) injection 10 mg  10 mg Intramuscular TID PRN Tingling, Stephanie, PA-C       And   diphenhydrAMINE (BENADRYL) injection 50 mg  50 mg Intramuscular TID PRN Tingling, Stephanie, PA-C       And   LORazepam (ATIVAN) injection 2 mg  2 mg Intramuscular TID PRN Tingling, Stephanie, PA-C       docusate sodium (COLACE) capsule 100 mg  100 mg Oral BID Tingling, Stephanie, PA-C   100 mg at 03/22/24 1723   hydrALAZINE (APRESOLINE) tablet 50 mg  50 mg Oral Q2H PRN Lorretta Harp, MD        hydrOXYzine (ATARAX) tablet 25 mg  25 mg Oral TID PRN Tingling, Stephanie, PA-C   25 mg at 03/22/24 1506   ibuprofen (ADVIL) tablet 800 mg  800 mg Oral Q6H PRN Tingling, Stephanie, PA-C   800 mg at 03/22/24 1300   insulin aspart (novoLOG) injection 0-6 Units  0-6 Units Subcutaneous TID WC Myriam Forehand, NP   1 Units at 03/22/24 1130   linezolid (ZYVOX) tablet 600 mg  600 mg Oral Q12H Mikey College T, MD   600 mg at 03/22/24 0816   magnesium hydroxide (MILK OF MAGNESIA) suspension 30 mL  30 mL Oral Daily PRN Tingling, Judeth Cornfield, PA-C  melatonin tablet 5 mg  5 mg Oral QHS Tingling, Stephanie, PA-C   5 mg at 03/21/24 2106   nicotine (NICODERM CQ - dosed in mg/24 hours) patch 21 mg  21 mg Transdermal Daily Lorretta Harp, MD       nystatin (MYCOSTATIN) 100000 UNIT/ML suspension 500,000 Units  5 mL Oral QID Tingling, Stephanie, PA-C   500,000 Units at 03/22/24 1723   OLANZapine zydis (ZYPREXA) disintegrating tablet 10 mg  10 mg Oral BID Myriam Forehand, NP   10 mg at 03/22/24 0815   propranolol (INDERAL) tablet 10 mg  10 mg Oral BID Myriam Forehand, NP   10 mg at 03/22/24 1723    Lab Results:  Results for orders placed or performed during the hospital encounter of 03/14/24 (from the past 48 hours)  Glucose, capillary     Status: Abnormal   Collection Time: 03/20/24  9:43 PM  Result Value Ref Range   Glucose-Capillary 238 (H) 70 - 99 mg/dL    Comment: Glucose reference range applies only to samples taken after fasting for at least 8 hours.  Glucose, capillary     Status: Abnormal   Collection Time: 03/21/24  8:14 AM  Result Value Ref Range   Glucose-Capillary 114 (H) 70 - 99 mg/dL    Comment: Glucose reference range applies only to samples taken after fasting for at least 8 hours.  Glucose, capillary     Status: Abnormal   Collection Time: 03/21/24 11:41 AM  Result Value Ref Range   Glucose-Capillary 118 (H) 70 - 99 mg/dL    Comment: Glucose reference range applies only to samples taken after  fasting for at least 8 hours.  CBC     Status: Abnormal   Collection Time: 03/21/24  1:01 PM  Result Value Ref Range   WBC 7.4 4.0 - 10.5 K/uL   RBC 3.41 (L) 4.22 - 5.81 MIL/uL   Hemoglobin 11.4 (L) 13.0 - 17.0 g/dL   HCT 16.1 (L) 09.6 - 04.5 %   MCV 97.4 80.0 - 100.0 fL   MCH 33.4 26.0 - 34.0 pg   MCHC 34.3 30.0 - 36.0 g/dL   RDW 40.9 81.1 - 91.4 %   Platelets 317 150 - 400 K/uL   nRBC 0.0 0.0 - 0.2 %    Comment: Performed at [facility], [address] [place], [place] [address]  Glucose, capillary     Status: None   Collection Time: 03/21/24  4:51 PM  Result Value Ref Range   Glucose-Capillary 95 70 - 99 mg/dL    Comment: Glucose reference range applies only to samples taken after fasting for at least 8 hours.  C-reactive protein     Status: Abnormal   Collection Time: 03/21/24  9:04 PM  Result Value Ref Range   CRP 3.5 (H) <1.0 mg/dL    Comment: Performed at [facility], [address], [place], [place] [address]  Sedimentation rate     Status: Abnormal   Collection Time: 03/21/24  9:04 PM  Result Value Ref Range   Sed Rate 84 (H) 0 - 15 mm/hr    Comment: Performed at [facility], [address] [place], [place] [address]  Culture, blood (Routine X 2) w Reflex to ID Panel     Status: None (Preliminary result)   Collection Time: 03/21/24  9:04 PM   Specimen: BLOOD RIGHT ARM  Result Value Ref Range   Specimen Description BLOOD RIGHT ARM  Special Requests      BOTTLES DRAWN AEROBIC AND ANAEROBIC Blood Culture results may not be optimal due to an inadequate volume of blood received in culture bottles   Culture      NO GROWTH < 12 HOURS Performed at [facility], [address] [place], [place] [address]    Report Status PENDING   Culture, blood (Routine X 2) w Reflex to ID Panel     Status: None (Preliminary result)   Collection Time: 03/21/24  9:04 PM   Specimen: BLOOD LEFT ARM  Result Value Ref Range   Specimen Description  BLOOD LEFT ARM    Special Requests      BOTTLES DRAWN AEROBIC ONLY Blood Culture results may not be optimal due to an inadequate volume of blood received in culture bottles   Culture      NO GROWTH < 12 HOURS Performed at [facility], [address] [place], [place] [address]    Report Status PENDING   Glucose, capillary     Status: Abnormal   Collection Time: 03/21/24 11:56 PM  Result Value Ref Range   Glucose-Capillary 108 (H) 70 - 99 mg/dL    Comment: Glucose reference range applies only to samples taken after fasting for at least 8 hours.  CBC     Status: Abnormal   Collection Time: 03/22/24  6:45 AM  Result Value Ref Range   WBC 4.6 4.0 - 10.5 K/uL   RBC 3.47 (L) 4.22 - 5.81 MIL/uL   Hemoglobin 11.4 (L) 13.0 - 17.0 g/dL   HCT 36.6 (L) 44.0 - 34.7 %   MCV 97.1 80.0 - 100.0 fL   MCH 32.9 26.0 - 34.0 pg   MCHC 33.8 30.0 - 36.0 g/dL   RDW 42.5 95.6 - 38.7 %   Platelets 306 150 - 400 K/uL   nRBC 0.0 0.0 - 0.2 %    Comment: Performed at [name], [address], [place], [place] [address]  Basic metabolic panel     Status: Abnormal   Collection Time: 03/22/24  6:45 AM  Result Value Ref Range   Sodium 134 (L) 135 - 145 mmol/L   Potassium 4.0 3.5 - 5.1 mmol/L   Chloride 104 98 - 111 mmol/L   CO2 23 22 - 32 mmol/L   Glucose, Bld 151 (H) 70 - 99 mg/dL    Comment: Glucose reference range applies only to samples taken after fasting for at least 8 hours.   BUN 13 6 - 20 mg/dL   Creatinine, Ser 2.95 0.61 - 1.24 mg/dL   Calcium 9.1 8.9 - 18.8 mg/dL   GFR, Estimated >41 >66 mL/min    Comment: (NOTE) Calculated using the CKD-EPI Creatinine Equation (2021)    Anion gap 7 5 - 15    Comment: Performed at [facility], [address] [place], [place] [address]  Glucose, capillary     Status: Abnormal   Collection Time: 03/22/24  8:13 AM  Result Value Ref Range   Glucose-Capillary 159 (H) 70 - 99 mg/dL    Comment: Glucose reference range applies  only to samples taken after fasting for at least 8 hours.  Glucose, capillary     Status: Abnormal   Collection Time: 03/22/24 11:28 AM  Result Value Ref Range   Glucose-Capillary 158 (H) 70 - 99 mg/dL    Comment: Glucose reference range applies only to samples taken after fasting for at least 8 hours.  Glucose, capillary  Status: Abnormal   Collection Time: 03/22/24  5:15 PM  Result Value Ref Range   Glucose-Capillary 115 (H) 70 - 99 mg/dL    Comment: Glucose reference range applies only to samples taken after fasting for at least 8 hours.  Glucose, capillary     Status: Abnormal   Collection Time: 03/22/24  8:33 PM  Result Value Ref Range   Glucose-Capillary 121 (H) 70 - 99 mg/dL    Comment: Glucose reference range applies only to samples taken after fasting for at least 8 hours.    Blood Alcohol level:  Lab Results  Component Value Date   ETH <10 10/01/2022   ETH <10 03/25/2020    Metabolic Disorder Labs: Lab Results  Component Value Date   HGBA1C 5.1 03/15/2024   MPG 99.67 03/15/2024   No results found for: "PROLACTIN" Lab Results  Component Value Date   CHOL 108 03/15/2024   TRIG 106 03/15/2024   HDL 39 (L) 03/15/2024   CHOLHDL 2.8 03/15/2024   VLDL 21 03/15/2024   LDLCALC 48 03/15/2024   LDLCALC 40 11/07/2022    Physical Findings: AIMS:  , ,  ,  ,    CIWA:    COWS:     Musculoskeletal: Strength & Muscle Tone: within normal limits Gait & Station: normal Patient leans: N/A  Psychiatric Specialty Exam:  Presentation  General Appearance:  Appropriate for Environment  Eye Contact: Good  Speech: Clear and Coherent; Normal Rate  Speech Volume: Normal  Handedness: Right   Mood and Affect  Mood: Anxious; Irritable  Affect: Appropriate; Congruent   Thought Process  Thought Processes: Coherent; Goal Directed  Descriptions of Associations:Intact  Orientation:Full (Time, Place and Person)  Thought Content:Rumination  History of  Schizophrenia/Schizoaffective disorder:Yes  Duration of Psychotic Symptoms:Greater than six months  Hallucinations:Hallucinations: None Description of Command Hallucinations: denies  Ideas of Reference:None  Suicidal Thoughts:Suicidal Thoughts: No  Homicidal Thoughts:Homicidal Thoughts: No HI Active Intent and/or Plan: -- (denies) HI Passive Intent and/or Plan: -- (denies)   Sensorium  Memory: Immediate Good; Recent Good; Remote Good  Judgment: Fair  Insight: Fair   Art therapist  Concentration: Fair  Attention Span: Fair  Recall: Fair  Fund of Knowledge: Good  Language: Good   Psychomotor Activity  Psychomotor Activity: Psychomotor Activity: Normal   Assets  Assets: Communication Skills; Desire for Improvement; Resilience   Sleep  Sleep: Sleep: Good Number of Hours of Sleep: 5    Physical Exam: Physical Exam Vitals and nursing note reviewed.  Constitutional:      Appearance: Normal appearance.  HENT:     Head: Normocephalic and atraumatic.     Nose: Nose normal.  Pulmonary:     Effort: Pulmonary effort is normal.  Musculoskeletal:     Cervical back: Normal range of motion.     Left lower leg: Edema present.  Skin:    Comments: cellulitis of the left foot and osteomyelitis of the right little finger,   Neurological:     General: No focal deficit present.     Mental Status: He is alert and oriented to person, place, and time. Mental status is at baseline.  Psychiatric:        Attention and Perception: Attention and perception normal.        Mood and Affect: Affect normal. Mood is anxious.        Speech: Speech normal.        Behavior: Behavior is hyperactive. Behavior is cooperative.  Thought Content: Thought content normal.        Cognition and Memory: Cognition and memory normal.        Judgment: Judgment is impulsive.    Review of Systems  Skin:        cellulitis of the left foot and osteomyelitis of the right  little finger,   Psychiatric/Behavioral:  Positive for substance abuse. The patient is nervous/anxious.   All other systems reviewed and are negative.  Blood pressure 126/72, pulse 90, temperature (!) 97.5 F (36.4 C), resp. rate 20, height 5\' 9"  (1.753 m), weight 72.1 kg, SpO2 100%. Body mass index is 23.48 kg/m.   Treatment Plan Summary: Daily contact with patient to assess and evaluate symptoms and progress in treatment and Medication management Zyprexa (Olanzapine) 10 mg PO QHS - Increased from previous dose for stabilization of psychotic sym Wound care team to assess left foot necrosis and right hand drainage CBG monitoring initiated to assess for hyperglycemia or infection-related metabolic disturbance Augmentin (amoxicillin-clavulanate) 875 mg PO BID - added for broader gram-positive and anaerobic  Zyvox (linezolid) 600 mg BID - initiated for suspected/confirmed resistant gram-positive infection. Inderal (Propranolol) 10 mg - Twice daily for anxiety, tremors, or cardiovascular support Continue Hydroxyzine 25 mg TID PRN for anxiety. Melatonin 5 mg HS for sleep aid  Sliding scale insulin initiated to manage hyperglycemia (possibly stress- or medication-induced). Wound care monitoring for right middle finger infection - thick yellow discharge and bleeding. Podiatry consult pending for left foot cellulitis and toe involvement. Myriam Forehand, NP 03/22/2024, 8:48 PM

## 2024-03-22 NOTE — Group Note (Signed)
 Date:  03/22/2024 Time:  9:48 PM  Group Topic/Focus:  Wrap-Up Group:   The focus of this group is to help patients review their daily goal of treatment and discuss progress on daily workbooks.    Participation Level:  Active  Participation Quality:  Appropriate and Attentive  Affect:  Appropriate  Cognitive:  Alert and Appropriate  Insight: Appropriate  Engagement in Group:  Engaged  Modes of Intervention:  Discussion and Orientation  Additional Comments:     Maglione,Mark Galloway 03/22/2024, 9:48 PM

## 2024-03-23 DIAGNOSIS — L02612 Cutaneous abscess of left foot: Secondary | ICD-10-CM | POA: Diagnosis not present

## 2024-03-23 DIAGNOSIS — M65841 Other synovitis and tenosynovitis, right hand: Secondary | ICD-10-CM

## 2024-03-23 DIAGNOSIS — Z1612 Extended spectrum beta lactamase (ESBL) resistance: Secondary | ICD-10-CM

## 2024-03-23 DIAGNOSIS — B9620 Unspecified Escherichia coli [E. coli] as the cause of diseases classified elsewhere: Secondary | ICD-10-CM

## 2024-03-23 DIAGNOSIS — M65949 Unspecified synovitis and tenosynovitis, unspecified hand: Secondary | ICD-10-CM

## 2024-03-23 DIAGNOSIS — F17210 Nicotine dependence, cigarettes, uncomplicated: Secondary | ICD-10-CM

## 2024-03-23 DIAGNOSIS — Z5900 Homelessness unspecified: Secondary | ICD-10-CM

## 2024-03-23 DIAGNOSIS — B20 Human immunodeficiency virus [HIV] disease: Secondary | ICD-10-CM | POA: Diagnosis not present

## 2024-03-23 DIAGNOSIS — B9562 Methicillin resistant Staphylococcus aureus infection as the cause of diseases classified elsewhere: Secondary | ICD-10-CM | POA: Diagnosis not present

## 2024-03-23 DIAGNOSIS — A4902 Methicillin resistant Staphylococcus aureus infection, unspecified site: Secondary | ICD-10-CM

## 2024-03-23 DIAGNOSIS — M86141 Other acute osteomyelitis, right hand: Secondary | ICD-10-CM | POA: Diagnosis not present

## 2024-03-23 DIAGNOSIS — F319 Bipolar disorder, unspecified: Secondary | ICD-10-CM | POA: Diagnosis not present

## 2024-03-23 LAB — GLUCOSE, CAPILLARY: Glucose-Capillary: 124 mg/dL — ABNORMAL HIGH (ref 70–99)

## 2024-03-23 MED ORDER — ATOVAQUONE 750 MG/5ML PO SUSP
1500.0000 mg | Freq: Every day | ORAL | Status: DC
  Administered 2024-03-24 – 2024-03-25 (×2): 1500 mg via ORAL
  Filled 2024-03-23 (×2): qty 10

## 2024-03-23 NOTE — Progress Notes (Signed)
 Patient denies si,hi and avh. Did patient dressing change. At 2000  03/23/24 0200  Psych Admission Type (Psych Patients Only)  Admission Status Voluntary  Psychosocial Assessment  Patient Complaints Anxiety  Eye Contact Fair  Facial Expression Animated  Affect Appropriate to circumstance  Speech Logical/coherent  Interaction Assertive  Motor Activity Slow  Appearance/Hygiene Unremarkable  Behavior Characteristics Cooperative;Anxious  Mood Anxious  Aggressive Behavior  Effect No apparent injury  Thought Process  Coherency WDL  Content WDL  Delusions None reported or observed  Perception WDL  Hallucination None reported or observed  Judgment WDL  Confusion None  Danger to Self  Current suicidal ideation? Denies  Self-Injurious Behavior No self-injurious ideation or behavior indicators observed or expressed   Agreement Not to Harm Self Yes  Danger to Others  Danger to Others None reported or observed

## 2024-03-23 NOTE — Group Note (Signed)
 Date:  03/23/2024 Time:  8:52 PM  Group Topic/Focus:  Wrap-Up Group:   The focus of this group is to help patients review their daily goal of treatment and discuss progress on daily workbooks.    Participation Level:  Did Not Attend  Participation Quality:   none  Affect:   none  Cognitive:   none  Insight: None  Engagement in Group:   none  Modes of Intervention:   none  Additional Comments:  none   Belva Crome 03/23/2024, 8:52 PM

## 2024-03-23 NOTE — Group Note (Signed)
 LCSW Group Therapy Note   Group Date: 03/23/2024 Start Time: 1300 End Time: 1400   Type of Therapy and Topic:  Group Therapy: Challenging Core Beliefs  Participation Level:  Did Not Attend  Description of Group:  Patients were educated about core beliefs and asked to identify one harmful core belief that they have. Patients were asked to explore from where those beliefs originate. Patients were asked to discuss how those beliefs make them feel and the resulting behaviors of those beliefs. They were then be asked if those beliefs are true and, if so, what evidence they have to support them. Lastly, group members were challenged to replace those negative core beliefs with helpful beliefs.   Therapeutic Goals:   1. Patient will identify harmful core beliefs and explore the origins of such beliefs. 2. Patient will identify feelings and behaviors that result from those core beliefs. 3. Patient will discuss whether such beliefs are true. 4.  Patient will replace harmful core beliefs with helpful ones.  Summary of Patient Progress:  Patient did not attend.   Therapeutic Modalities: Cognitive Behavioral Therapy; Solution-Focused Therapy   [name], LCSWA 03/23/2024  2:07 PM

## 2024-03-23 NOTE — Group Note (Signed)
 Recreation Therapy Group Note   Group Topic:Health and Wellness  Group Date: 03/23/2024 Start Time: 1530 End Time: 1635 Facilitators: Rosina Lowenstein, LRT, CTRS Location: Courtyard  Group Description: [employer]. LRT and patients played games of basketball, drew with chalk, and played corn hole while outside in the courtyard while getting fresh air and sunlight. Music was being played in the background. LRT and peers conversed about different games they have played before, what they do in their free time and anything else that is on their minds. LRT encouraged pts to drink water after being outside, sweating and getting their heart rate up.  Goal Area(s) Addressed: Patient will build on frustration tolerance skills. Patients will partake in a competitive play game with peers. Patients will gain knowledge of new leisure interest/hobby.    Affect/Mood: Appropriate   Participation Level: Active and Engaged   Participation Quality: Independent   Behavior: Appropriate   Speech/Thought Process: Coherent   Insight: Good   Judgement: Good   Modes of Intervention: Activity   Patient Response to Interventions:  Receptive   Education Outcome:  Acknowledges education   Clinical Observations/Individualized Feedback: Torey was active in their participation of session activities and group discussion. Pt interacted well with LRT and peers duration of session.    Plan: Continue to engage patient in RT group sessions 2-3x/week.   Rosina Lowenstein, LRT, CTRS 03/23/2024 5:23 PM

## 2024-03-23 NOTE — Group Note (Signed)
 Date:  03/23/2024 Time:  10:56 AM  Group Topic/Focus:  Goals Group:   The focus of this group is to help patients establish daily goals to achieve during treatment and discuss how the patient can incorporate goal setting into their daily lives to aide in recovery.   Participation Level:  Active  Participation Quality:  Appropriate  Affect:  Appropriate  Cognitive:  Appropriate  Insight: Appropriate  Engagement in Group:  Engaged  Modes of Intervention:  Activity, Discussion, and Education  Additional Comments:    Hailyn Zarr A Eliah Marquard 03/23/2024, 10:56 AM

## 2024-03-23 NOTE — Progress Notes (Signed)
 [facility] Progress Note  Patient: Mark Galloway    GNF:[id number]  DOA: 03/14/2024     Date of Service: the patient was seen and examined on 03/23/2024  No chief complaint on file.  Brief hospital course: Daveyon Kitchings is an 38 y.o. male with PMH of  HIV on HART, bipolar disorder, paranoid schizophrenia, first presented to hospital on 02/21/24 with swelling and blister of the right hand was found to have a right middle finger flexor sheath infection and abscess subsequently underwent I&D on 2/22 and subsequently treated with IV antibiotics and discharge on 2/23 with Bactrim.  Patient subsequently admitted to behavioral health unit to treat bipolar disorder on 3/18. Patient was evaluated again by internal medicine and concerning for recurrent infection and Bactrim was restarted, and patient completed Bactrim treatment however continue to experience pain associated with right mid finger and worsening of purulent discharge with intermittent bleeding.    Findings of X-ray from [facility] are concerning for early osteomyelitis. Recent superficial culture showing MRSA, E. Coli, and Strep. Dr. Mikey College from our group did consult today 3/22 and escalate ABX to Zyvox. MRI of right fingers pending. Consulted Dr. Hyacinth Meeker of ortho.    I am asked by [facility] NP to see pt again this evening 3/22, due to left foot pain and elevate Bp. His Bp is 116/68. Pt has bloody blister over the dorsum of the MP joint of the left great toe, with surrounding erythema in left foot. Pt has mild tenderness in left foot.  Denies fever and chills.  Patient does not have chest pain, cough, SOB.  No nausea, vomiting, diarrhea or abdominal pain.  No symptoms of UTI.  Patient denies IV drug use.   Date reviewed, lab, image and vitals: WBC 7.4, temperature normal, blood pressure 116/68, heart rate 92, RR 18.    EKG: done on 3/18. I reviewed EKG independently.  Sinus rhythm, QTc 397, no ischemic change.     Assessment and Plan:  #  Osteomyelitis right little finger pt has signs of worsening wound infection on the right middle finger with continuous intermittent wound bleeding and thick yellowish pus.   [facility] from [facility] concerning for early osteomyelitis. Recent superficial culture showing MRSA, E. Coli, and Strep. Pt is switched to zyvox by Dr. Chipper Herb.  Patient does not have fever or leukocytosis.  Clinically not septic.  Consulted Dr. Hyacinth Meeker of ortho.   -continue Zyvox and Augmentin -pending MRI of the right middle finger -Consult wound care team MRI shows osteomyelitis and possible abscess along with cellulitis and myositis.  Dr. Hyacinth Meeker recommended to follow with Dr. Merlyn Lot and surgeon increase provide as an outpatient.   Cellulitis of foot, left: Patient has bloody blister in the right foot with surrounding erythema, indicating cellulitis.  Patient likely had injury which he was not aware of it.  Patient does not have fever or leukocytosis, not septic. -Will continue Zyvox -will add Augmentin bid -will get Blood culture -check CRP and ESR -X-ray of left foot showed soft tissue swelling.,  No fracture.  3/23 podiatry consulted, s/p MRI left foot, negative Osteo, positive cellulitis, fluid negative culture. 3/24 blister was drained by podiatry, continue wound care, follow culture and continue to biotics, follow ID.   AIDS ([name]) -Continue Biktarvy.  Last CD4 count 146.   Bipolar 1 disorder and paranoid schizophrenia: Patient is calm now. Treatment as per psychiatry team.   HTN: [facility] NP Keith Rake told me that pt has elevated blood pressure, but his blood  pressure is 116/68 as documented in epic.  -Patient is propranolol 10 mg twice daily currently. -Will add prn oral hydralazine 50 mg every 2-hour for SBP > 160    [facility] sign off: D/w psych that patient needs to follow-up with orthopedic surgery as an outpatient for right little finger osteomyelitis.  Seen by podiatry for left foot infection, follow wound  culture, continue antibiotics as per ID recommendation. We will sign off, reconsult if needed.  Body mass index is 23.48 kg/m.  Interventions:  Diet: Carb modified diet DVT Prophylaxis: Ambulatory no need, low risk  Advance goals of care discussion: Full code  Family Communication: family was not present at bedside, at the time of interview.  The pt provided permission to discuss medical plan with the family. Opportunity was given to ask question and all questions were answered satisfactorily.   Disposition:  Pt is from home, admitted with bipolar disorder at [facility], has right little finger infection and left foot infection so [facility] was consulted. Orthopedics is following for right little finger infection, follow MRI report Podiatry consulted for left foot infection, may need I&D, podiatry ordered MRI which is pending   Subjective: No significant overnight events, Pain is under control   Physical Exam: Seen yesterday  General: NAD, lying comfortably Extremities: Right little finger dressing CDI  Left foot edema 2+, erythema and tenderness, blister on the dorsum of great toe at metatarsophalangeal joint  Neurologic: without any new focal findings   Vitals:   03/21/24 1825 03/22/24 0815 03/22/24 1815 03/23/24 0612  BP: 116/68 128/70 126/72 121/79  Pulse: 92 100 90 95  Resp:  20  19  Temp:  98 F (36.7 C) (!) 97.5 F (36.4 C) 98.1 F (36.7 C)  TempSrc:  Oral    SpO2: 100% 98% 100% 100%  Weight:      Height:       No intake or output data in the 24 hours ending 03/23/24 1506 Filed Weights   03/14/24 1500  Weight: 72.1 kg    Data Reviewed: I have personally reviewed and interpreted daily labs, tele strips, imagings as discussed above. I reviewed all nursing notes, pharmacy notes, vitals, pertinent old records I have discussed plan of care as described above with RN and patient/family.  CBC: Recent Labs  Lab 03/18/24 0641 03/21/24 1301 03/22/24 0645  WBC 3.7* 7.4  4.6  NEUTROABS 1.0*  --   --   HGB 11.7* 11.4* 11.4*  HCT 34.7* 33.2* 33.7*  MCV 96.4 97.4 97.1  PLT 301 317 306   Basic Metabolic Panel: Recent Labs  Lab 03/18/24 0641 03/22/24 0645  NA 135 134*  K 4.3 4.0  CL 105 104  CO2 24 23  GLUCOSE 108* 151*  BUN 13 13  CREATININE 0.84 0.72  CALCIUM 9.3 9.1    Studies: No results found.   Scheduled Meds:  amoxicillin-clavulanate  1 tablet Oral Q12H   ascorbic acid  1,000 mg Oral Daily   bictegravir-emtricitabine-tenofovir AF  1 tablet Oral Daily   docusate sodium  100 mg Oral BID   insulin aspart  0-6 Units Subcutaneous TID WC   linezolid  600 mg Oral Q12H   melatonin  5 mg Oral QHS   nicotine  21 mg Transdermal Daily   nystatin  5 mL Oral QID   OLANZapine zydis  10 mg Oral BID   propranolol  10 mg Oral BID   Continuous Infusions: PRN Meds: acetaminophen, alum & mag hydroxide-simeth, haloperidol **AND** diphenhydrAMINE, haloperidol  lactate **AND** diphenhydrAMINE **AND** LORazepam, haloperidol lactate **AND** diphenhydrAMINE **AND** LORazepam, hydrALAZINE, hydrOXYzine, ibuprofen, magnesium hydroxide  Time spent: 35 minutes  Author: Gillis Santa. MD [facility] 03/23/2024 3:06 PM  To reach On-call, see care teams to locate the attending and reach out to them via [email or web address]. If 7PM-7AM, please contact night-coverage If you still have difficulty reaching the attending provider, please page the [facility] (Director on Call) for [facility] on amion for assistance.

## 2024-03-23 NOTE — Progress Notes (Addendum)
 Date of Admission:  03/14/2024      ID: Mark Galloway is a 38 y.o. male Principal Problem:   Bipolar 1 disorder ([name]) Active Problems:   AIDS ([name])   Paranoid schizophrenia ([name])   Finger osteomyelitis ([name])   Cellulitis of foot, left   HTN (hypertension)  Mark Galloway is a 38 y.o. with a history of Bipolar disorder, AIDS, treated syphilis , followed at [facility] for AIDS was admitted to [facility] for depression/anxiety and homelessness   Recently was in 02/21/24-02/24/24 admitted for abscess finger I/D  MRSA and took bactrim Followed by Betha Loa   Pt has been treated for syphilis with 3 doses of Benzathine penicillin on 03/08/23, 03/15/23 and 03/22/23. LP done then VDRL neg- 11 wbc That time RPR 1:32 HE then left [place] and went to [place]. Was hospitalized in [facility] in Aug 2024 and RPR then was 1:256- This was a new infection and he received one dose of Benzathine P 2.4 Mu Then on Jan 05, 2024 he was back in the hospital for diarrhea and RPR then was 1;128, appropriate response Pt has been taking Biktarvy for 3 months straight now HE was non complaint before. Says he is homeless  Subjective: I had seen patient earlier for syphilis and AIDS Today asked to see patient for rt 5th finger infection due to MRSA Pt on 02/22/24 underwent I/D of rt small finger sheath infection and abscess I/D by Dr.Kuzma. it was MRSA and he was placed on Bactrim He had a follow up appt with him last week but has been hospitalized to psych unit   The hospitalist saw the wound and ordered culture Also noted to have a painful blister left foot and seen by podiatrist and underwent I/D and debridement- superficial wound - MRI no deep tissue involvement HE thinks it is spider bite but this is MRSA  Medications:   amoxicillin-clavulanate  1 tablet Oral Q12H   ascorbic acid  1,000 mg Oral Daily   bictegravir-emtricitabine-tenofovir AF  1 tablet Oral Daily   docusate sodium  100 mg Oral BID   insulin aspart  0-6 Units  Subcutaneous TID WC   linezolid  600 mg Oral Q12H   melatonin  5 mg Oral QHS   nicotine  21 mg Transdermal Daily   nystatin  5 mL Oral QID   OLANZapine zydis  10 mg Oral BID   propranolol  10 mg Oral BID    Objective: Vital signs in last 24 hours: Patient Vitals for the past 24 hrs:  BP Temp Pulse Resp SpO2  03/23/24 0612 121/79 98.1 F (36.7 C) 95 19 100 %  03/22/24 1815 126/72 (!) 97.5 F (36.4 C) 90 -- 100 %      PHYSICAL EXAM:  General: Alert, cooperative, no distress, appears stated age.  Lungs: Clear to auscultation bilaterally. No Wheezing or Rhonchi. No rales. Heart: Regular rate and rhythm, no murmur, rub or gallop. Abdomen: Soft, non-tender,not distended. Bowel sounds normal. No masses Extremities: rt hand       Skin: No rashes or lesions. Or bruising Lymph: Cervical, supraclavicular normal. Neurologic: Grossly non-focal  Lab Results    Latest Ref Rng & Units 03/22/2024    6:45 AM 03/21/2024    1:01 PM 03/18/2024    6:41 AM  CBC  WBC 4.0 - 10.5 K/uL 4.6  7.4  3.7   Hemoglobin 13.0 - 17.0 g/dL 16.1  09.6  04.5   Hematocrit 39.0 - 52.0 % 33.7  33.2  34.7   Platelets 150 - 400 K/uL 306  317  301        Latest Ref Rng & Units 03/22/2024    6:45 AM 03/18/2024    6:41 AM 03/14/2024    9:05 AM  CMP  Glucose 70 - 99 mg/dL 161  096  045   BUN 6 - 20 mg/dL 13  13  10    Creatinine 0.61 - 1.24 mg/dL 4.09  8.11  9.14   Sodium 135 - 145 mmol/L 134  135  133   Potassium 3.5 - 5.1 mmol/L 4.0  4.3  4.4   Chloride 98 - 111 mmol/L 104  105  102   CO2 22 - 32 mmol/L 23  24  21    Calcium 8.9 - 10.3 mg/dL 9.1  9.3  9.0   Total Protein 6.5 - 8.1 g/dL  8.3  7.6   Total Bilirubin 0.0 - 1.2 mg/dL  0.4  <7.8   Alkaline Phos 38 - 126 U/L  71  70   AST 15 - 41 U/L  22  23   ALT 0 - 44 U/L  16  12       Microbiology: Wound culture MRSA ESBL ecoli rare Studies/Results: MR FOOT LEFT WO CONTRAST Result Date: 03/22/2024 CLINICAL DATA:  Diabetic foot swelling EXAM: MRI OF  THE LEFT FOOT WITHOUT CONTRAST TECHNIQUE: Multiplanar, multisequence MR imaging of the left forefoot was performed. No intravenous contrast was administered. COMPARISON:  Radiographs 03/21/2024 FINDINGS: Bones/Joint/Cartilage 7 mm non-fragmented osteochondral lesion of the head of the first metatarsal along the central articular surface. No findings of osteomyelitis in the forefoot.  No malalignment. Ligaments Lisfranc ligament intact. Muscles and Tendons Subtle edema in the first interosseous muscle for example on image 16 series 6, potentially reflecting low-grade myositis. No abscess. Soft tissues Diffuse dorsal subcutaneous edema in the forefoot, cannot exclude cellulitis. This extends into the first toe and to a lesser extent into the second and third toes. Blister or alternatively immediately subcutaneous thin walled fluid collection along the cutaneous surface dorsal to the distal first metatarsal and MTP joint measuring 3.2 by 1.0 by 2.6 cm (volume = 4.4 cm^3). IMPRESSION: 1. Diffuse dorsal subcutaneous edema in the forefoot, cannot exclude cellulitis. This extends into the first toe and to a lesser extent into the second and third toes. 2. 4.4 cc blister or alternatively immediately subcutaneous fluid collection along the cutaneous surface dorsal to the distal first metatarsal and MTP joint. 3. Subtle edema in the first interosseous muscle, potentially reflecting low-grade myositis. 4. 7 mm non-fragmented osteochondral lesion of the head of the first metatarsal along the central articular surface, attributable to arthropathy. No osteomyelitis observed. Electronically Signed   By: Gaylyn Rong M.D.   On: 03/22/2024 18:24   DG Foot 2 Views Left Result Date: 03/21/2024 CLINICAL DATA:  Foot cellulitis EXAM: LEFT FOOT - 2 VIEW COMPARISON:  None Available. FINDINGS: There is no evidence of fracture or dislocation. Mild degenerative change at the first MTP joint. Protuberant soft tissue swelling at the  distal dorsum of the foot. No soft tissue emphysema or radiopaque foreign body IMPRESSION: No acute osseous abnormality. Protuberant soft tissue swelling at the distal dorsum of the foot. Electronically Signed   By: Jasmine Pang M.D.   On: 03/21/2024 23:55     Assessment/Plan: MRSA infection of the rt 5th finger S/p I/D in feb HE was non compliant with bactrim as OP because he was homeless Now the culture is MRSA +  esbl ecoli ( rare- which is likely colonization or contamination)  MRI -osteo of the finger questioned Pt is currently on linezolid and augmentin- DC augmentin Will need linezolid for 2-4  weeks but need to make sure that his psych meds will not have any interatcions and put him at risk for serotonin syndrome. While on linezolid need to advise him on food and meds to avoid- need pharmacist to counsel  him and give printed material Will need CBC once a week  while on linezolid The other option would be IV dalbavancin once a week for 2-4 weeks  . He will have to come to day surgery once a week for this but it will obviate the need for daily antibiotic HE will have to follow up with Dr.kuzma  Left foot MRSA abscess- s/p I/D culture pending  Pt is homeless and he has stated that if discharged he will not be able to take his meds because that would not be his priority . 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 Appropriate response now- it is 1;32 No further treatment or testing needed now Can check in 3 months at His HIV provider's office   Bipolar  disorder Schizophrenia   Discussed the management with patient, Psychiatrist NP Ms.Tingling, hospitalist Dr.kumar

## 2024-03-23 NOTE — Group Note (Signed)
 Recreation Therapy Group Note   Group Topic:Coping Skills  Group Date: 03/23/2024 Start Time: 1100 End Time: 1140 Facilitators: Rosina Lowenstein, LRT, CTRS Location:  Craft Room  Group Description: Mind Map.  Patient was provided a blank template of a diagram with 32 blank boxes in a tiered system, branching from the center (similar to a bubble chart). LRT directed patients to label the middle of the diagram "Coping Skills". LRT and patients then came up with 8 different coping skills as examples. Pt were directed to record their coping skills in the 2nd tier boxes closest to the center.  Patients would then share their coping skills with the group as LRT wrote them out. LRT gave a handout of 99 different coping skills at the end of group.   Goal Area(s) Addressed: Patients will be able to define "coping skills". Patient will identify new coping skills.  Patient will increase communication.   Affect/Mood: Appropriate   Participation Level: Moderate    Clinical Observations/Individualized Feedback: Mark Galloway came to group with 10 minutes reaming.   Plan: Continue to engage patient in RT group sessions 2-3x/week.   [address] LRT, CTRS 03/23/2024 1:12 PM

## 2024-03-23 NOTE — Plan of Care (Signed)
   Problem: Education: Goal: Knowledge of [facility] Education information/materials will improve Outcome: Progressing Goal: Emotional status will improve Outcome: Progressing Goal: Mental status will improve Outcome: Progressing Goal: Verbalization of understanding the information provided will improve Outcome: Progressing

## 2024-03-23 NOTE — Progress Notes (Signed)
 [facility] MD Progress Note  03/24/2024 11:23 AM Mark Galloway  MRN:  [id number]  38 year old never married African-American male with reported history of bipolar disorder, stimulant use disorder, medical history of AIDS, syphilis, cellulitis of right fifth digit with recent debridement, presented to [facility] on 3/13 accompanied by his mother, with chief complaint of depression and anxiety, thoughts of wanting to hurt manager at [employer] weeks the day prior to presenting, lack of sleep, homelessness, abnormal behaviors such as going into people's homes uninvited as well as their vehicles.    Subjective:   Patient's case discussed with multidisciplinary team, all vitals and notes were reviewed.  No behavioral issues reported overnight.  Patient is seen for reassessment, reports that he is doing okay.  He endorses mild depression and anxiety related to disposition.  Otherwise has no other concerns.  Denies SI/HI and AVH.  Principal Problem: Bipolar 1 disorder ([name]) Diagnosis: Principal Problem:   Bipolar 1 disorder ([name]) Active Problems:   AIDS ([name])   Paranoid schizophrenia ([name])   Finger osteomyelitis ([name])   Cellulitis of foot, left   HTN (hypertension)   MRSA infection   Tenosynovitis of finger  Total Time spent with patient: 1 hr   Past Psychiatric History:  DX: Per chart review-bipolar disorder with psychotic features Outpatient provider: None Current caregiver:  Patient is own guardian/ care giver Past hospitalizations: [facility] 2021 for mania and psychosis, patient reports in 2024 at a facility in [place] x 3 weeks Medication trials : Zyprexa, per chart review-lithium Suicide attempts: Patient denies Patient denies ever having an Act/CST team. Denies ECT, Clozaril treatments.  Past Medical History:  Past Medical History:  Diagnosis Date   Bipolar 1 disorder ([name])    HIV infection ([name])    Schizophrenia ([name])    Shingles     Past Surgical History:  Procedure Laterality Date    I & D EXTREMITY Right 02/22/2024   Procedure: IRRIGATION AND DEBRIDEMENT EXTREMITY;  Surgeon: Betha Loa, MD;  Location: [facility] OR;  Service: Orthopedics;  Laterality: Right;   IRRIGATION AND DEBRIDEMENT FOOT Left    Family History: History reviewed. No pertinent family history. Family Psychiatric  History:  Patient reports maternal grandmother-schizophrenia Patient denies any family history of suicide, substance use disorder  Social History:  Social History   Substance and Sexual Activity  Alcohol Use Yes   Comment: 3-4 per week     Social History   Substance and Sexual Activity  Drug Use Never    Social History   Socioeconomic History   Marital status: Single    Spouse name: Not on file   Number of children: 0   Years of education: 16   Highest education level: Not on file  Occupational History   Occupation: Public relations account executive  Tobacco Use   Smoking status: Some Days    Types: Cigars, Cigarettes   Smokeless tobacco: Never   Tobacco comments:    2 per week  Vaping Use   Vaping status: Never Used  Substance and Sexual Activity   Alcohol use: Yes    Comment: 3-4 per week   Drug use: Never   Sexual activity: Not Currently    Comment: accepted condoms  Other Topics Concern   Not on file  Social History Narrative   Not on file   Social Drivers of Health   Financial Resource Strain: High Risk (01/04/2024)   Received from [facility]   Overall Financial Resource Strain (CARDIA)    Difficulty of Paying  Living Expenses: Very hard  Food Insecurity: Food Insecurity Present (03/14/2024)   Hunger Vital Sign    Worried About Running Out of Food in the Last Year: Sometimes true    Ran Out of Food in the Last Year: Sometimes true  Transportation Needs: No Transportation Needs (03/14/2024)   PRAPARE - Administrator, Civil Service (Medical): No    Lack of Transportation (Non-Medical): No  Recent Concern: Transportation Needs - Unmet Transportation Needs  (02/23/2024)   PRAPARE - Administrator, Civil Service (Medical): Yes    Lack of Transportation (Non-Medical): Yes  Physical Activity: Not on file  Stress: Not on file  Social Connections: Unknown (05/04/2022)   Received from [facility]   Social Network    Social Network: Not on file   Additional Social History:                         Sleep: Good  Appetite:  Good  Current Medications: Current Facility-Administered Medications  Medication Dose Route Frequency Provider Last Rate Last Admin   acetaminophen (TYLENOL) tablet 650 mg  650 mg Oral Q6H PRN Lorretta Harp, MD   650 mg at 03/22/24 1424   alum & mag hydroxide-simeth (MAALOX/MYLANTA) 200-200-20 MG/5ML suspension 30 mL  30 mL Oral Q4H PRN Vang Kraeger, PA-C       ascorbic acid (VITAMIN C) tablet 1,000 mg  1,000 mg Oral Daily Sabin Gibeault, PA-C   1,000 mg at 03/24/24 0738   atovaquone (MEPRON) 750 MG/5ML suspension 1,500 mg  1,500 mg Oral Q breakfast Lynn Ito, MD   1,500 mg at 03/24/24 0740   bictegravir-emtricitabine-tenofovir AF (BIKTARVY) 50-200-25 MG per tablet 1 tablet  1 tablet Oral Daily Evanie Buckle, PA-C   1 tablet at 03/24/24 4098   haloperidol (HALDOL) tablet 5 mg  5 mg Oral TID PRN Barlow Harrison, PA-C   5 mg at 03/23/24 1743   And   diphenhydrAMINE (BENADRYL) capsule 50 mg  50 mg Oral TID PRN Maripat Borba, PA-C   50 mg at 03/23/24 1743   haloperidol lactate (HALDOL) injection 5 mg  5 mg Intramuscular TID PRN Kindred Heying, PA-C       And   diphenhydrAMINE (BENADRYL) injection 50 mg  50 mg Intramuscular TID PRN Rosalie Gelpi, PA-C       And   LORazepam (ATIVAN) injection 2 mg  2 mg Intramuscular TID PRN Inez Stantz, PA-C       haloperidol lactate (HALDOL) injection 10 mg  10 mg Intramuscular TID PRN Ronetta Molla, PA-C       And   diphenhydrAMINE (BENADRYL) injection 50 mg  50 mg Intramuscular TID PRN Suzane Vanderweide,  Terina Mcelhinny, PA-C       And   LORazepam (ATIVAN) injection 2 mg  2 mg Intramuscular TID PRN Faryn Sieg, PA-C   2 mg at 03/23/24 1743   docusate sodium (COLACE) capsule 100 mg  100 mg Oral BID Weronika Birch, PA-C   100 mg at 03/24/24 0739   hydrALAZINE (APRESOLINE) tablet 50 mg  50 mg Oral Q2H PRN Lorretta Harp, MD       hydrOXYzine (ATARAX) tablet 25 mg  25 mg Oral TID PRN Kotaro Buer, PA-C   25 mg at 03/22/24 1506   ibuprofen (ADVIL) tablet 800 mg  800 mg Oral Q6H PRN Tyshawn Ciullo, PA-C   800 mg at 03/23/24 1827   insulin aspart (novoLOG) injection 0-6 Units  0-6  Units Subcutaneous TID WC Myriam Forehand, NP   1 Units at 03/22/24 1130   linezolid (ZYVOX) tablet 600 mg  600 mg Oral Q12H Mikey College T, MD   600 mg at 03/24/24 0740   magnesium hydroxide (MILK OF MAGNESIA) suspension 30 mL  30 mL Oral Daily PRN Colleen Kotlarz, PA-C       melatonin tablet 5 mg  5 mg Oral QHS Tyvion Edmondson, PA-C   5 mg at 03/23/24 2142   nicotine (NICODERM CQ - dosed in mg/24 hours) patch 21 mg  21 mg Transdermal Daily Lorretta Harp, MD   21 mg at 03/24/24 0741   nystatin (MYCOSTATIN) 100000 UNIT/ML suspension 500,000 Units  5 mL Oral QID Nautika Cressey, PA-C   500,000 Units at 03/24/24 0740   OLANZapine zydis (ZYPREXA) disintegrating tablet 10 mg  10 mg Oral BID Myriam Forehand, NP   10 mg at 03/24/24 1610   propranolol (INDERAL) tablet 10 mg  10 mg Oral BID Myriam Forehand, NP   10 mg at 03/24/24 9604    Lab Results:  Results for orders placed or performed during the hospital encounter of 03/14/24 (from the past 48 hours)  Glucose, capillary     Status: Abnormal   Collection Time: 03/22/24 11:28 AM  Result Value Ref Range   Glucose-Capillary 158 (H) 70 - 99 mg/dL    Comment: Glucose reference range applies only to samples taken after fasting for at least 8 hours.  Glucose, capillary     Status: Abnormal   Collection Time: 03/22/24  5:15 PM  Result Value Ref Range    Glucose-Capillary 115 (H) 70 - 99 mg/dL    Comment: Glucose reference range applies only to samples taken after fasting for at least 8 hours.  Glucose, capillary     Status: Abnormal   Collection Time: 03/22/24  8:33 PM  Result Value Ref Range   Glucose-Capillary 121 (H) 70 - 99 mg/dL    Comment: Glucose reference range applies only to samples taken after fasting for at least 8 hours.  Aerobic/Anaerobic Culture w Gram Stain (surgical/deep wound)     Status: None (Preliminary result)   Collection Time: 03/23/24  8:15 AM   Specimen: Foot; Wound  Result Value Ref Range   Specimen Description      FOOT Performed at [facility], [address] [place], [place] [address]    Special Requests      LEFT FOOT Performed at [facility], [address] [place], [place] [address]    Gram Stain NO WBC SEEN RARE GRAM POSITIVE COCCI IN PAIRS     Culture      MODERATE STAPHYLOCOCCUS AUREUS SUSCEPTIBILITIES TO FOLLOW Performed at [facility], [address] [place], [place] [address]    Report Status PENDING   Glucose, capillary     Status: Abnormal   Collection Time: 03/23/24  8:16 AM  Result Value Ref Range   Glucose-Capillary 124 (H) 70 - 99 mg/dL    Comment: Glucose reference range applies only to samples taken after fasting for at least 8 hours.  Glucose, capillary     Status: Abnormal   Collection Time: 03/24/24  7:37 AM  Result Value Ref Range   Glucose-Capillary 101 (H) 70 - 99 mg/dL    Comment: Glucose reference range applies only to samples taken after fasting for at least 8 hours.   Comment 1 Notify RN     Blood Alcohol level:  Lab  Results  Component Value Date   ETH <10 10/01/2022   ETH <10 03/25/2020    Metabolic Disorder Labs: Lab Results  Component Value Date   HGBA1C 5.1 03/15/2024   MPG 99.67 03/15/2024   No results found for: "PROLACTIN" Lab Results  Component Value Date   CHOL 108 03/15/2024   TRIG 106 03/15/2024   HDL 39 (L)  03/15/2024   CHOLHDL 2.8 03/15/2024   VLDL 21 03/15/2024   LDLCALC 48 03/15/2024   LDLCALC 40 11/07/2022    Physical Findings: AIMS:  , ,  ,  ,    CIWA:    COWS:     Musculoskeletal: Strength & Muscle Tone: within normal limits Gait & Station: normal Patient leans: N/A  Psychiatric Specialty Exam:  Presentation  General Appearance:  Appropriate for Environment  Eye Contact: Good  Speech: Clear and Coherent; Normal Rate  Speech Volume: Normal  Handedness: Right   Mood and Affect  Mood: Anxious; Irritable  Affect: Appropriate; Congruent   Thought Process  Thought Processes: Coherent; Goal Directed  Descriptions of Associations:Intact  Orientation:Full (Time, Place and Person)  Thought Content:Rumination  History of Schizophrenia/Schizoaffective disorder:Yes  Duration of Psychotic Symptoms:Greater than six months  Hallucinations:No data recorded  Ideas of Reference:None  Suicidal Thoughts:No data recorded  Homicidal Thoughts:No data recorded   Sensorium  Memory: Immediate Good; Recent Good; Remote Good  Judgment: Fair  Insight: Fair   Chartered certified accountant: Fair  Attention Span: Fair  Recall: Fair  Fund of Knowledge: Good  Language: Good   Psychomotor Activity  Psychomotor Activity: No data recorded   Assets  Assets: Communication Skills; Desire for Improvement; Resilience   Sleep  Sleep: No data recorded    Physical Exam: Physical Exam Vitals and nursing note reviewed.  Constitutional:      Appearance: Normal appearance.  HENT:     Head: Normocephalic and atraumatic.     Nose: Nose normal.  Pulmonary:     Effort: Pulmonary effort is normal.  Musculoskeletal:     Cervical back: Normal range of motion.     Left lower leg: Edema present.  Skin:    Comments: cellulitis of the left foot and osteomyelitis of the right little finger,   Neurological:     General: No focal deficit present.      Mental Status: He is alert and oriented to person, place, and time. Mental status is at baseline.  Psychiatric:        Attention and Perception: Attention and perception normal.        Mood and Affect: Affect normal. Mood is anxious and depressed.        Speech: Speech normal.        Behavior: Behavior is cooperative.        Thought Content: Thought content normal.        Cognition and Memory: Cognition and memory normal.        Judgment: Judgment is impulsive.     Comments: Thought process is simplistic     Review of Systems  Skin:        cellulitis of the left foot and osteomyelitis of the right little finger,   Psychiatric/Behavioral:  Positive for depression and substance abuse. The patient is nervous/anxious.   All other systems reviewed and are negative.  Blood pressure 124/68, pulse 99, temperature 98.2 F (36.8 C), temperature source Oral, resp. rate 19, height 5\' 9"  (1.753 m), weight 72.1 kg, SpO2 99%. Body mass index is 23.48 kg/m.  Treatment Plan Summary:  1.    Safety and Monitoring:   --  Voluntary admission to inpatient psychiatric unit for safety, stabilization and treatment -- Daily contact with patient to assess and evaluate symptoms and progress in treatment -- Patient's case to be discussed in multi-disciplinary team meeting -- Observation Level : q15 minute checks -- Vital signs:  q12 hours -- Precautions: suicide   2. Psychiatric Diagnoses and Treatment:   03/23/2024 -- Continue Zyprexa 10 mg twice daily for mood/psychosis -- Continue hydroxyzine 25 mg TID PRN -- Continue melatonin 5 mg HS for sleep aid  -- Will need to discuss with team, have family meeting to discuss safe dispo  03/15/2024 -- Continue Zyprexa 5 mg BID for mood/psychosis -- Continue hydroxyzine 25 mg TID PRN -- Start melatonin 5 mg HS for sleep aid  -- Labs ordered for HbA1c and lipid panel    --  The risks/benefits/side-effects/alternatives to this medication were discussed  in detail with the patient and time was given for questions. The patient consents to medication trial. -- Metabolic profile and EKG monitoring obtained while on an atypical antipsychotic  -- Encouraged patient to participate in unit milieu and in scheduled group therapies -- Short Term Goals: Ability to identify changes in lifestyle to reduce recurrence of condition will improve, Ability to verbalize feelings will improve, Ability to disclose and discuss suicidal ideas, Ability to demonstrate self-control will improve, Ability to identify and develop effective coping behaviors will improve, Ability to maintain clinical measurements within normal limits will improve, Compliance with prescribed medications will improve, and Ability to identify triggers associated with substance abuse/mental health issues will improve -- Long Term Goals: Improvement in symptoms so as ready for discharge        3. Medical Issues Being Addressed:               Hx of syphilis             -- labs for RPR, ID consult   -- no need for further treatment per chart review, RPR 1:32               AIDS             -- Continue home med Biktarvy 1 tab daily             -- ID consult - Labs for HIV 1 RNA, CD4 count   -- chart reviewed, recommend continue home med                Osteomyelitis  of R. Fifth digit             -- Per chart review underwent operative debridement 2/22 and saw hand surgery again on 3/ 5. Is followed by Dr. Lucianne Muss orthopedics             -- Wound care consult -- 3/24 chart review - Is being followed by Hospitalist, ID.  Cultures positive for MRSA, E. coli and strep.  MRI of fingers pending.  Per ID, D/c Augmentin today 3/24 , continue Zyvox (linezolid) 600 mg BID for 2-4 weeks, CBC once weekly while on Zyvox.  Recommendations by hospitalist to follow up with ortho.   Left foot cellulitis   -- first metatarsal phalangeal joint area with blister   -- seen by Podiatry: I&D performed, wound cultures  obtained, treated with Betadine, replaced for postoperative as well as daily dressing changes   Hyperglycemia   -- Sliding scale insulin initiated to manage  hyperglycemia (possibly stress- or medication-induced).      4. Discharge Planning:   -- Social work and case management to assist with discharge planning and identification of hospital follow-up needs prior to discharge -- Estimated LOS: 5-7 days -- Discharge Concerns: Need to establish a safety plan; Medication compliance and effectiveness -- Discharge Goals: Return home with outpatient referrals for mental health follow-up including medication management/psychotherapy  Paulene Floor, PA-C 03/24/2024, 10:54 AM

## 2024-03-23 NOTE — Progress Notes (Signed)
   03/23/24 1500  Psych Admission Type (Psych Patients Only)  Admission Status Voluntary  Psychosocial Assessment  Patient Complaints Anxiety  Eye Contact Fair  Facial Expression Animated;Anxious  Affect Labile  Speech Print production planner Activity  (WDL)  Appearance/Hygiene Unremarkable  Behavior Characteristics Cooperative  Mood Anxious  Thought Process  Coherency WDL  Content WDL  Delusions None reported or observed  Perception WDL  Hallucination None reported or observed  Judgment WDL  Confusion WDL  Danger to Self  Current suicidal ideation? Denies  Agreement Not to Harm Self Yes  Description of Agreement verbal  Danger to Others  Danger to Others None reported or observed

## 2024-03-23 NOTE — Consult Note (Signed)
 PODIATRY / FOOT AND ANKLE SURGERY CONSULTATION NOTE  Requesting Physician: Dr. Lucianne Muss  Reason for consult: Left foot abscess  HPI: Mark Galloway is a 38 y.o. male who presents with complaints of left foot pain near the first metatarsal phalangeal joint area.  He is unsure of how long that the blister has been there for but believes he may have been bitten by a spider but he is not sure.  Patient is currently dealing with an infection in his right pinky finger and is getting antibiotics for this due to osteomyelitis.  Infectious diseases also been consulted on the case as well as orthopedics.  Patient has some mild pain to the area of the blister to the left foot.  He denies any other issues at this time.  PMHx:  Past Medical History:  Diagnosis Date   Bipolar 1 disorder ([name])    HIV infection ([name])    Schizophrenia ([name])    Shingles     Surgical Hx:  Past Surgical History:  Procedure Laterality Date   I & D EXTREMITY Right 02/22/2024   Procedure: IRRIGATION AND DEBRIDEMENT EXTREMITY;  Surgeon: Betha Loa, MD;  Location: [facility] OR;  Service: Orthopedics;  Laterality: Right;   IRRIGATION AND DEBRIDEMENT FOOT Left     FHx: History reviewed. No pertinent family history.  Social History:  reports that he has been smoking cigars and cigarettes. He has never used smokeless tobacco. He reports current alcohol use. He reports that he does not use drugs.  Allergies:  Allergies  Allergen Reactions   Peanut Oil Hives   Peanut-Containing Drug Products    Medications Prior to Admission  Medication Sig Dispense Refill   bictegravir-emtricitabine-tenofovir AF (BIKTARVY) 50-200-25 MG TABS tablet Take 1 tablet by mouth daily. 30 tablet 0   ascorbic acid (VITAMIN C) 1000 MG tablet Take 1 tablet (1,000 mg total) by mouth daily. (Patient not taking: Reported on 03/14/2024)     sulfamethoxazole-trimethoprim (BACTRIM DS) 800-160 MG tablet Take 1 tablet by mouth 2 (two) times daily. (Patient not taking:  Reported on 03/14/2024) 10 tablet 0    Physical Exam: General: Alert and oriented.  No apparent distress.  Vascular: DP/PT pulses palpable bilateral, capillary fill time intact to digits bilaterally, minimal hair growth present to bilateral lower extremities.  Mild erythema and edema present to the left first metatarsal phalangeal joint area that appears to be localized and does not have any proximal streaking.  Neuro: Light touch sensation present bilateral lower extremities.  Derm: Fluctuant blister present to the dorsal aspect the left first metatarsal phalange joint area, appears to have a small hyperkeratotic present to the central aspect of the lesion.  Appears to likely have seropurulent discharge present within the area.  Mild corresponding erythema and edema present to this area with no proximal streaking.  Predebridement:   Postdebridement   MSK: 5/5 strength bilateral lower extremity muscle groups.  Pain on palpation left first metatarsal phalange joint.  Results for orders placed or performed during the hospital encounter of 03/14/24 (from the past 48 hours)  CBC     Status: Abnormal   Collection Time: 03/21/24  1:01 PM  Result Value Ref Range   WBC 7.4 4.0 - 10.5 K/uL   RBC 3.41 (L) 4.22 - 5.81 MIL/uL   Hemoglobin 11.4 (L) 13.0 - 17.0 g/dL   HCT 29.5 (L) 62.1 - 30.8 %   MCV 97.4 80.0 - 100.0 fL   MCH 33.4 26.0 - 34.0 pg   MCHC 34.3 30.0 -  36.0 g/dL   RDW 14.7 82.9 - 56.2 %   Platelets 317 150 - 400 K/uL   nRBC 0.0 0.0 - 0.2 %    Comment: Performed at [facility], [address] [place], [place] [address]  Glucose, capillary     Status: None   Collection Time: 03/21/24  4:51 PM  Result Value Ref Range   Glucose-Capillary 95 70 - 99 mg/dL    Comment: Glucose reference range applies only to samples taken after fasting for at least 8 hours.  C-reactive protein     Status: Abnormal   Collection Time: 03/21/24  9:04 PM  Result Value Ref Range   CRP 3.5 (H)  <1.0 mg/dL    Comment: Performed at [facility], [address], [place], [place] [address]  Sedimentation rate     Status: Abnormal   Collection Time: 03/21/24  9:04 PM  Result Value Ref Range   Sed Rate 84 (H) 0 - 15 mm/hr    Comment: Performed at [facility], [address] [place], [place] [address]  Culture, blood (Routine X 2) w Reflex to ID Panel     Status: None (Preliminary result)   Collection Time: 03/21/24  9:04 PM   Specimen: BLOOD RIGHT ARM  Result Value Ref Range   Specimen Description BLOOD RIGHT ARM    Special Requests      BOTTLES DRAWN AEROBIC AND ANAEROBIC Blood Culture results may not be optimal due to an inadequate volume of blood received in culture bottles   Culture      NO GROWTH 2 DAYS Performed at [facility], [address], [place], [place] [address]    Report Status PENDING   Culture, blood (Routine X 2) w Reflex to ID Panel     Status: None (Preliminary result)   Collection Time: 03/21/24  9:04 PM   Specimen: BLOOD LEFT ARM  Result Value Ref Range   Specimen Description BLOOD LEFT ARM    Special Requests      BOTTLES DRAWN AEROBIC ONLY Blood Culture results may not be optimal due to an inadequate volume of blood received in culture bottles   Culture      NO GROWTH 2 DAYS Performed at [facility], [address] [place], [place] [address]    Report Status PENDING   Glucose, capillary     Status: Abnormal   Collection Time: 03/21/24 11:56 PM  Result Value Ref Range   Glucose-Capillary 108 (H) 70 - 99 mg/dL    Comment: Glucose reference range applies only to samples taken after fasting for at least 8 hours.  CBC     Status: Abnormal   Collection Time: 03/22/24  6:45 AM  Result Value Ref Range   WBC 4.6 4.0 - 10.5 K/uL   RBC 3.47 (L) 4.22 - 5.81 MIL/uL   Hemoglobin 11.4 (L) 13.0 - 17.0 g/dL   HCT 02.7 (L) 25.3 - 66.4 %   MCV 97.1 80.0 - 100.0 fL   MCH 32.9 26.0 - 34.0 pg   MCHC 33.8 30.0 - 36.0 g/dL    RDW 40.3 47.4 - 25.9 %   Platelets 306 150 - 400 K/uL   nRBC 0.0 0.0 - 0.2 %    Comment: Performed at [facility], [address], [place], [place] [address]  Basic metabolic panel     Status: Abnormal   Collection Time: 03/22/24  6:45 AM  Result Value Ref Range   Sodium 134 (  L) 135 - 145 mmol/L   Potassium 4.0 3.5 - 5.1 mmol/L   Chloride 104 98 - 111 mmol/L   CO2 23 22 - 32 mmol/L   Glucose, Bld 151 (H) 70 - 99 mg/dL    Comment: Glucose reference range applies only to samples taken after fasting for at least 8 hours.   BUN 13 6 - 20 mg/dL   Creatinine, Ser 1.61 0.61 - 1.24 mg/dL   Calcium 9.1 8.9 - 09.6 mg/dL   GFR, Estimated >04 >54 mL/min    Comment: (NOTE) Calculated using the CKD-EPI Creatinine Equation (2021)    Anion gap 7 5 - 15    Comment: Performed at [facility], [address] [place], [place] [address]  Glucose, capillary     Status: Abnormal   Collection Time: 03/22/24  8:13 AM  Result Value Ref Range   Glucose-Capillary 159 (H) 70 - 99 mg/dL    Comment: Glucose reference range applies only to samples taken after fasting for at least 8 hours.  Glucose, capillary     Status: Abnormal   Collection Time: 03/22/24 11:28 AM  Result Value Ref Range   Glucose-Capillary 158 (H) 70 - 99 mg/dL    Comment: Glucose reference range applies only to samples taken after fasting for at least 8 hours.  Glucose, capillary     Status: Abnormal   Collection Time: 03/22/24  5:15 PM  Result Value Ref Range   Glucose-Capillary 115 (H) 70 - 99 mg/dL    Comment: Glucose reference range applies only to samples taken after fasting for at least 8 hours.  Glucose, capillary     Status: Abnormal   Collection Time: 03/22/24  8:33 PM  Result Value Ref Range   Glucose-Capillary 121 (H) 70 - 99 mg/dL    Comment: Glucose reference range applies only to samples taken after fasting for at least 8 hours.  Glucose, capillary     Status: Abnormal   Collection Time: 03/23/24   8:16 AM  Result Value Ref Range   Glucose-Capillary 124 (H) 70 - 99 mg/dL    Comment: Glucose reference range applies only to samples taken after fasting for at least 8 hours.   MR FOOT LEFT WO CONTRAST Result Date: 03/22/2024 CLINICAL DATA:  Diabetic foot swelling EXAM: MRI OF THE LEFT FOOT WITHOUT CONTRAST TECHNIQUE: Multiplanar, multisequence MR imaging of the left forefoot was performed. No intravenous contrast was administered. COMPARISON:  Radiographs 03/21/2024 FINDINGS: Bones/Joint/Cartilage 7 mm non-fragmented osteochondral lesion of the head of the first metatarsal along the central articular surface. No findings of osteomyelitis in the forefoot.  No malalignment. Ligaments Lisfranc ligament intact. Muscles and Tendons Subtle edema in the first interosseous muscle for example on image 16 series 6, potentially reflecting low-grade myositis. No abscess. Soft tissues Diffuse dorsal subcutaneous edema in the forefoot, cannot exclude cellulitis. This extends into the first toe and to a lesser extent into the second and third toes. Blister or alternatively immediately subcutaneous thin walled fluid collection along the cutaneous surface dorsal to the distal first metatarsal and MTP joint measuring 3.2 by 1.0 by 2.6 cm (volume = 4.4 cm^3). IMPRESSION: 1. Diffuse dorsal subcutaneous edema in the forefoot, cannot exclude cellulitis. This extends into the first toe and to a lesser extent into the second and third toes. 2. 4.4 cc blister or alternatively immediately subcutaneous fluid collection along the cutaneous surface dorsal to the distal first metatarsal and MTP joint. 3. Subtle edema in the first interosseous muscle, potentially  reflecting low-grade myositis. 4. 7 mm non-fragmented osteochondral lesion of the head of the first metatarsal along the central articular surface, attributable to arthropathy. No osteomyelitis observed. Electronically Signed   By: Gaylyn Rong M.D.   On: 03/22/2024 18:24    MR FINGERS RICHT WO CONTRAST Result Date: 03/22/2024 CLINICAL DATA:  Worsening wound infection of the finger with purulent drainage EXAM: MRI OF THE RIGHT FINGERS WITHOUT CONTRAST TECHNIQUE: Multiplanar, multisequence MR imaging of the right fingers was performed. No intravenous contrast was administered. COMPARISON:  Radiographs 03/17/2024 FINDINGS: Bones/Joint/Cartilage Substantial marrow edema in the left middle phalanx small finger compatible with osteomyelitis. Subtle marrow edema in the base of the distal phalanx may reflect early osteomyelitis. Joint effusion of the small digit proximal interphalangeal joint, septic arthritis not excluded. Ligaments Grossly unremarkable Muscles and Tendons Severe indistinctness and attenuation of the distal flexor digiti minimi, with the tendon displaced away from the bony structures at the middle phalangeal level and distal proximal phalangeal level. Flexor tendon tear not excluded although some of the indistinctness may be due to severe inflammation. Suspected loss integrity of various pulleys in the region including the A2, C1, A3, C2, and A4 pulleys given the displacement of the tendon away from the bony structures. Low-grade edema in the carpal tunnel. Abnormal edema suggesting myositis in the dorsal and palmar interosseous muscles adjacent to the fifth metacarpal. Soft tissues Severe inflammatory findings and edema signal volar to the proximal and middle phalanges of the small finger encompassing a 4.6 by 1.8 by 1.2 cm region, suspicious for potential abscess, with fluid signal draining along the medial margin of the flexor tendon towards the volar-medial surface of the finger, probably a draining ulcer. Wound noted along the volar fifth finger. Diffuse subcutaneous edema in the fifth finger extending into the dorsum of the hand and partially into the fourth finger probably from cellulitis. IMPRESSION: 1. Osteomyelitis involving the middle phalanx and base of the  distal phalanx small finger. 2. Severe inflammatory findings and edema signal volar to the proximal and middle phalanges of the small finger encompassing a 4.6 by 1.8 by 1.2 cm region, suspicious for potential abscess, with fluid signal draining along the medial margin of the flexor tendon towards the volar-medial surface of the finger, probably a draining ulcer. 3. Joint effusion of the small digit proximal interphalangeal joint, septic arthritis not excluded. 4. Severe indistinctness and attenuation of the distal flexor digiti minimi, with the tendon displaced away from the bony structures at the middle phalangeal level and distal proximal phalangeal level. Flexor tendon tear not excluded although some of the indistinctness may be due to severe inflammation. Suspected loss integrity of various pulleys in the region including the A2, C1, C3, C2, and A4 pulleys given the displacement of the tendon away from the bony structures. 5. Abnormal edema suggesting myositis in the dorsal and palmar interosseous muscles adjacent to the fifth metacarpal. 6. Diffuse subcutaneous edema in the fifth finger extending into the dorsum of the hand and partially into the fourth finger probably from cellulitis. 7. Low-grade edema in the carpal tunnel. Electronically Signed   By: Gaylyn Rong M.D.   On: 03/22/2024 12:51   DG Foot 2 Views Left Result Date: 03/21/2024 CLINICAL DATA:  Foot cellulitis EXAM: LEFT FOOT - 2 VIEW COMPARISON:  None Available. FINDINGS: There is no evidence of fracture or dislocation. Mild degenerative change at the first MTP joint. Protuberant soft tissue swelling at the distal dorsum of the foot. No soft tissue emphysema or  radiopaque foreign body IMPRESSION: No acute osseous abnormality. Protuberant soft tissue swelling at the distal dorsum of the foot. Electronically Signed   By: Jasmine Pang M.D.   On: 03/21/2024 23:55    Blood pressure 121/79, pulse 95, temperature 98.1 F (36.7 C), resp. rate  19, height 5\' 9"  (1.753 m), weight 72.1 kg, SpO2 100%.  Assessment Abscess blister left first metatarsal phalangeal joint Diabetes type 2, without complication  Plan -Patient seen and examined -X-ray imaging and MRI reviewed.  Appears show superficial cutaneous abscess, no signs of infection.  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 -Clinically patient appears to have a infected blister present to the dorsal aspect left first metatarsal phalange joint, unsure if this was related to a spider bite versus a friction related issue with something rubbing against the spot. -Infected blister was drained with the 15 blade into the level of dermal tissue, seropurulent discharge was expressed from the area and a wound culture was taken and sent off.  The blister area was then deroofed with a pair of pickups and scissors.  Gauze debridement was then performed to the dermal type ulceration with Betadine.  Overall there did not appear to be any deep tissue injury present and it appeared to be very superficial.  Patient did have 2 pinpoint little small areas of yellow at the central aspect of the lesion potentially consistent with a spider bite but unsure for certain. -Applied Betadine wet to dry dressing.  Order placed for postop shoe. -Order placed for dressing changes daily.  Remove dressing and clean area with saline, dry clean.  Apply Xeroform to the area followed by 4 x 4 gauze, gauze roll, Ace wrap. -Patient's finger wound growing multiple bacteria including MRSA, E. coli, group C strep.  Patient on Zyvox and Augmentin.  Believe this should cover foot infection as well.  Podiatry team to sign off at this time.  Please reconsult if worsening.  Will place follow-up instructions in patient's chart.  If patient is going to be in house for a while then we will recheck it in the next week.  Rosetta Posner, DPM 03/23/2024, 12:01 PM

## 2024-03-23 NOTE — Progress Notes (Signed)
 Patient in the hallway yelling, flashing the light switch on and off and banging on the door to the small tv room with a younger peer with him who was laughing at his display.  When told by this nurse to stop he said "fuck you you fat bitch".  Continued yelling and acting out.  Security had him go to his room.. This patient is on contact precautions for MRSA and refuses to stay in his room.  Management and physicians are aware of his non-compliance with contact precautions.  Will continue to monitor.

## 2024-03-24 DIAGNOSIS — F319 Bipolar disorder, unspecified: Secondary | ICD-10-CM | POA: Diagnosis not present

## 2024-03-24 LAB — GLUCOSE, CAPILLARY
Glucose-Capillary: 101 mg/dL — ABNORMAL HIGH (ref 70–99)
Glucose-Capillary: 123 mg/dL — ABNORMAL HIGH (ref 70–99)
Glucose-Capillary: 134 mg/dL — ABNORMAL HIGH (ref 70–99)
Glucose-Capillary: 93 mg/dL (ref 70–99)

## 2024-03-24 MED ORDER — OLANZAPINE 5 MG PO TBDP
15.0000 mg | ORAL_TABLET | Freq: Two times a day (BID) | ORAL | Status: DC
  Administered 2024-03-24 – 2024-03-31 (×14): 15 mg via ORAL
  Filled 2024-03-24 (×14): qty 1

## 2024-03-24 NOTE — Group Note (Signed)
 Recreation Therapy Group Note   Group Topic:Problem Solving  Group Date: 03/24/2024 Start Time: 1000 End Time: 1045 Facilitators: Rosina Lowenstein, LRT, CTRS Location:  Craft Room   Group Description: Life Boat. Patients were given the scenario that they are on a boat that is about to become shipwrecked, leaving them stranded on an [place]. They are asked to make a list of 15 different items that they want to take with them when they are stranded on the [place]. Patients are asked to rank their items from most important to least important, #1 being the most important and #15 being the least. Patients will work individually for the first round to come up with 15 items and then pair up with a peer(s) to condense their list and come up with one list of 15 items between the two of them. Patients or LRT will read aloud the 15 different items to the group after each round. LRT facilitated post-activity processing to discuss how this activity can be used in daily life post discharge.   Goal Area(s) Addressed:  Patient will identify priorities, wants and needs. Patient will communicate with LRT and peers. Patient will work collectively as a Administrator, Civil Service. Patient will work on Product manager.   Affect/Mood: N/A   Participation Level: Did not attend    Clinical Observations/Individualized Feedback: Patient did not attend group.   Plan: Continue to engage patient in RT group sessions 2-3x/week.   Rosina Lowenstein, LRT, CTRS 03/24/2024 11:35 AM

## 2024-03-24 NOTE — Plan of Care (Signed)
  Problem: Education: Goal: Mental status will improve Outcome: Progressing Goal: Verbalization of understanding the information provided will improve Outcome: Progressing   Problem: Activity: Goal: Interest or engagement in activities will improve Outcome: Progressing   Problem: Health Behavior/Discharge Planning: Goal: Compliance with treatment plan for underlying cause of condition will improve Outcome: Progressing   Problem: Physical Regulation: Goal: Ability to maintain clinical measurements within normal limits will improve Outcome: Progressing   Problem: Safety: Goal: Periods of time without injury will increase Outcome: Progressing   Problem: Education: Goal: Ability to describe self-care measures that may prevent or decrease complications (Diabetes Survival Skills Education) will improve Outcome: Progressing Goal: Individualized Educational Video(s) Outcome: Progressing   Problem: Coping: Goal: Ability to adjust to condition or change in health will improve Outcome: Progressing   Problem: Fluid Volume: Goal: Ability to maintain a balanced intake and output will improve Outcome: Progressing   Problem: Health Behavior/Discharge Planning: Goal: Ability to identify and utilize available resources and services will improve Outcome: Progressing Goal: Ability to manage health-related needs will improve Outcome: Progressing   Problem: Metabolic: Goal: Ability to maintain appropriate glucose levels will improve Outcome: Progressing   Problem: Nutritional: Goal: Maintenance of adequate nutrition will improve Outcome: Progressing Goal: Progress toward achieving an optimal weight will improve Outcome: Progressing   Problem: Skin Integrity: Goal: Risk for impaired skin integrity will decrease Outcome: Progressing   Problem: Tissue Perfusion: Goal: Adequacy of tissue perfusion will improve Outcome: Progressing   Problem: Clinical Measurements: Goal: Ability to  avoid or minimize complications of infection will improve Outcome: Progressing   Problem: Skin Integrity: Goal: Skin integrity will improve Outcome: Progressing

## 2024-03-24 NOTE — Progress Notes (Signed)
   03/24/24 1100  Psych Admission Type (Psych Patients Only)  Admission Status Voluntary  Psychosocial Assessment  Patient Complaints None (states I feel great)  Eye Contact Fair  Facial Expression Animated  Affect Appropriate to circumstance  Speech Logical/coherent  Interaction Assertive  Motor Activity Slow  Appearance/Hygiene Unremarkable;In hospital gown  Behavior Characteristics Cooperative  Mood Pleasant  Thought Process  Coherency WDL  Content WDL  Delusions None reported or observed  Perception WDL  Hallucination None reported or observed  Judgment WDL  Confusion WDL  Danger to Self  Current suicidal ideation? Denies  Self-Injurious Behavior No self-injurious ideation or behavior indicators observed or expressed   Agreement Not to Harm Self Yes  Description of Agreement verbal  Danger to Others  Danger to Others None reported or observed

## 2024-03-24 NOTE — Group Note (Signed)
 [facility] LCSW Group Therapy Note   Group Date: 03/24/2024 Start Time: 1300 End Time: 1400  Type of Therapy/Topic:  Group Therapy:  Feelings about Diagnosis  Participation Level:  None   Description of Group:    This group will allow patients to explore their thoughts and feelings about diagnoses they have received. Patients will be guided to explore their level of understanding and acceptance of these diagnoses. Facilitator will encourage patients to process their thoughts and feelings about the reactions of others to their diagnosis, and will guide patients in identifying ways to discuss their diagnosis with significant others in their lives. This group will be process-oriented, with patients participating in exploration of their own experiences as well as giving and receiving support and challenge from other group members.   Therapeutic Goals: 1. Patient will demonstrate understanding of diagnosis as evidence by identifying two or more symptoms of the disorder:  2. Patient will be able to express two feelings regarding the diagnosis 3. Patient will demonstrate ability to communicate their needs through discussion and/or role plays  Summary of Patient Progress: Patient declined to attend group.    Therapeutic Modalities:   Cognitive Behavioral Therapy Brief Therapy Feelings Identification    Harden Mo, LCSW

## 2024-03-24 NOTE — Group Note (Signed)
 Recreation Therapy Group Note   Group Topic:General Recreation  Group Date: 03/24/2024 Start Time: 1430 End Time: 1535 Facilitators: Rosina Lowenstein, LRT, CTRS Location: Courtyard  Group Description: [employer]. LRT and patients played games of basketball, drew with chalk, and played corn hole while outside in the courtyard while getting fresh air and sunlight. Music was being played in the background. LRT and peers conversed about different games they have played before, what they do in their free time and anything else that is on their minds. LRT encouraged pts to drink water after being outside, sweating and getting their heart rate up.  Goal Area(s) Addressed: Patient will build on frustration tolerance skills. Patients will partake in a competitive play game with peers. Patients will gain knowledge of new leisure interest/hobby.    Affect/Mood: Full range   Participation Level: Active   Participation Quality: Independent   Behavior: Cooperative   Speech/Thought Process: Coherent   Insight: Fair   Judgement: Fair    Modes of Intervention: Activity   Patient Response to Interventions:  Receptive   Education Outcome:  Acknowledges education   Clinical Observations/Individualized Feedback: Tarquin was active in their participation of session activities and group discussion. Pt interacted well with LRT and peers duration of session.    Plan: Continue to engage patient in RT group sessions 2-3x/week.   Rosina Lowenstein, LRT, CTRS 03/24/2024 5:00 PM

## 2024-03-24 NOTE — Plan of Care (Signed)
   Problem: Education: Goal: Emotional status will improve Outcome: Progressing   Problem: Education: Goal: Mental status will improve Outcome: Progressing

## 2024-03-24 NOTE — Plan of Care (Signed)
   Problem: Education: Goal: Knowledge of [facility] Education information/materials will improve Outcome: Progressing Goal: Emotional status will improve Outcome: Progressing

## 2024-03-24 NOTE — Progress Notes (Signed)
 [facility] MD Progress Note  03/25/2024 11:23 AM Mark Galloway  MRN:  [id number]  38 year old never married African-American male with reported history of bipolar disorder, stimulant use disorder, medical history of AIDS, syphilis, cellulitis of right fifth digit with recent debridement, presented to [facility] on 3/13 accompanied by his mother, with chief complaint of depression and anxiety, thoughts of wanting to hurt manager at [employer] weeks the day prior to presenting, lack of sleep, homelessness, abnormal behaviors such as going into people's homes uninvited as well as their vehicles.    Subjective:   Patient's case discussed with multidisciplinary team, all vitals and notes were reviewed.  No reported behavioral issues overnight.  Patient is seen for reassessment, he reports he is doing well, feeling much improved.  Appetite and sleep are good.  Denies SI/HI and AVH.  Reports that he may be able to return home with his mother, that she will ensure to assist him with medical parents, dressing changes.  States that otherwise he is interested in long-term inpatient substance use treatment program.  He relates that he feels capable of independently changing his dressings as long as he is provided the supplies.  He is able to understand the medical necessity for oral antibiotics, dressing changes to prevent any further worsening of infectious processes.  He is med compliant.  Did contact the patient's mother, she reports that she would be willing to assist the patient with medical issues, however she currently also does not have housing.  She expresses concerns that mood lability is secondary to substance use.  That she fears that if the patient is not discharged with housing, that he may end up being harmed by others as in the past he has broken into people's cars, etc.  She reports that usually the patient will wander off from residence so that he can use illicit substances.  Has a long history of  noncompliance with medications.  All questions and concerns addressed.  Update: Staff reports that the patient was disruptive during dinner, took another patient's meal though he was instructed not to.  Did attempt to discuss with the patient, however he presented with labile mood, irritable, began yelling at this provider, stating that someone has already addressed the situation with him, that he did not mean to do it, and that he no longer wants to speak about this, then walked away.   Principal Problem: Bipolar 1 disorder ([name]) Diagnosis: Principal Problem:   Bipolar 1 disorder ([name]) Active Problems:   AIDS ([name])   Paranoid schizophrenia ([name])   Finger osteomyelitis ([name])   Cellulitis of foot, left   HTN (hypertension)   MRSA infection   Tenosynovitis of finger  Total Time spent with patient: 1 hr   Past Psychiatric History:  DX: Per chart review-bipolar disorder with psychotic features Outpatient provider: None Current caregiver:  Patient is own guardian/ care giver Past hospitalizations: [facility] for mania and psychosis, patient reports in 2024 at a facility in [place] x 3 weeks Medication trials : Zyprexa, per chart review-lithium Suicide attempts: Patient denies Patient denies ever having an Act/CST team. Denies ECT, Clozaril treatments.  Past Medical History:  Past Medical History:  Diagnosis Date   Bipolar 1 disorder ([name])    HIV infection ([name])    Schizophrenia ([name])    Shingles     Past Surgical History:  Procedure Laterality Date   I & D EXTREMITY Right 02/22/2024   Procedure: IRRIGATION AND DEBRIDEMENT EXTREMITY;  Surgeon: Betha Loa, MD;  Location: [facility] OR;  Service: Orthopedics;  Laterality: Right;   IRRIGATION AND DEBRIDEMENT FOOT Left    Family History: History reviewed. No pertinent family history. Family Psychiatric  History:  Patient reports maternal grandmother-schizophrenia Patient denies any family history of suicide, substance use  disorder  Social History:  Social History   Substance and Sexual Activity  Alcohol Use Yes   Comment: 3-4 per week     Social History   Substance and Sexual Activity  Drug Use Never    Social History   Socioeconomic History   Marital status: Single    Spouse name: Not on file   Number of children: 0   Years of education: 16   Highest education level: Not on file  Occupational History   Occupation: Public relations account executive  Tobacco Use   Smoking status: Some Days    Types: Cigars, Cigarettes   Smokeless tobacco: Never   Tobacco comments:    2 per week  Vaping Use   Vaping status: Never Used  Substance and Sexual Activity   Alcohol use: Yes    Comment: 3-4 per week   Drug use: Never   Sexual activity: Not Currently    Comment: accepted condoms  Other Topics Concern   Not on file  Social History Narrative   Not on file   Social Drivers of Health   Financial Resource Strain: High Risk (01/04/2024)   Received from [facility]   Overall Financial Resource Strain (CARDIA)    Difficulty of Paying Living Expenses: Very hard  Food Insecurity: Food Insecurity Present (03/14/2024)   Hunger Vital Sign    Worried About Running Out of Food in the Last Year: Sometimes true    Ran Out of Food in the Last Year: Sometimes true  Transportation Needs: No Transportation Needs (03/14/2024)   PRAPARE - Administrator, Civil Service (Medical): No    Lack of Transportation (Non-Medical): No  Recent Concern: Transportation Needs - Unmet Transportation Needs (02/23/2024)   PRAPARE - Administrator, Civil Service (Medical): Yes    Lack of Transportation (Non-Medical): Yes  Physical Activity: Not on file  Stress: Not on file  Social Connections: Unknown (05/04/2022)   [facility] from [facility]   Social Network    Social Network: Not on file   Additional Social History:                         Sleep: Good  Appetite:  Good  Current  Medications: Current Facility-Administered Medications  Medication Dose Route Frequency Provider Last Rate Last Admin   acetaminophen (TYLENOL) tablet 650 mg  650 mg Oral Q6H PRN Lorretta Harp, MD   650 mg at 03/24/24 1513   alum & mag hydroxide-simeth (MAALOX/MYLANTA) 200-200-20 MG/5ML suspension 30 mL  30 mL Oral Q4H PRN Analisa Sledd, PA-C       ascorbic acid (VITAMIN C) tablet 1,000 mg  1,000 mg Oral Daily Arthuro Canelo, PA-C   1,000 mg at 03/24/24 0738   atovaquone (MEPRON) 750 MG/5ML suspension 1,500 mg  1,500 mg Oral Q breakfast Ravishankar, Rhodia Albright, MD   1,500 mg at 03/24/24 0740   bictegravir-emtricitabine-tenofovir AF (BIKTARVY) 50-200-25 MG per tablet 1 tablet  1 tablet Oral Daily Ronesha Heenan, PA-C   1 tablet at 03/24/24 0738   haloperidol (HALDOL) tablet 5 mg  5 mg Oral TID PRN Teejay Meader, PA-C   5 mg at 03/23/24 1743   And  diphenhydrAMINE (BENADRYL) capsule 50 mg  50 mg Oral TID PRN Gurtaj Ruz, PA-C   50 mg at 03/23/24 1743   haloperidol lactate (HALDOL) injection 5 mg  5 mg Intramuscular TID PRN Shirline Kendle, Judeth Cornfield, PA-C       And   diphenhydrAMINE (BENADRYL) injection 50 mg  50 mg Intramuscular TID PRN Dela Sweeny, PA-C       And   LORazepam (ATIVAN) injection 2 mg  2 mg Intramuscular TID PRN Moriah Loughry, PA-C       haloperidol lactate (HALDOL) injection 10 mg  10 mg Intramuscular TID PRN Asusena Sigley, PA-C       And   diphenhydrAMINE (BENADRYL) injection 50 mg  50 mg Intramuscular TID PRN Price Lachapelle, PA-C       And   LORazepam (ATIVAN) injection 2 mg  2 mg Intramuscular TID PRN Merdith Adan, PA-C   2 mg at 03/23/24 1743   docusate sodium (COLACE) capsule 100 mg  100 mg Oral BID [place] Lasseter, PA-C   100 mg at 03/24/24 1658   hydrALAZINE (APRESOLINE) tablet 50 mg  50 mg Oral Q2H PRN Lorretta Harp, MD       hydrOXYzine (ATARAX) tablet 25 mg  25 mg Oral TID PRN Klever Twyford, PA-C   25 mg at  03/24/24 1701   ibuprofen (ADVIL) tablet 800 mg  800 mg Oral Q6H PRN Valarie Farace, PA-C   800 mg at 03/24/24 1427   insulin aspart (novoLOG) injection 0-6 Units  0-6 Units Subcutaneous TID WC Myriam Forehand, NP   1 Units at 03/22/24 1130   linezolid (ZYVOX) tablet 600 mg  600 mg Oral Q12H Mikey College T, MD   600 mg at 03/24/24 2316   magnesium hydroxide (MILK OF MAGNESIA) suspension 30 mL  30 mL Oral Daily PRN Neil Brickell, PA-C       melatonin tablet 5 mg  5 mg Oral QHS Liliya Fullenwider, PA-C   5 mg at 03/24/24 2317   nicotine (NICODERM CQ - dosed in mg/24 hours) patch 21 mg  21 mg Transdermal Daily Lorretta Harp, MD   21 mg at 03/24/24 0741   nystatin (MYCOSTATIN) 100000 UNIT/ML suspension 500,000 Units  5 mL Oral QID Daquon Greenleaf, PA-C   500,000 Units at 03/24/24 2317   OLANZapine zydis (ZYPREXA) disintegrating tablet 15 mg  15 mg Oral BID Ancelmo Hunt, PA-C   15 mg at 03/24/24 2317   propranolol (INDERAL) tablet 10 mg  10 mg Oral BID Myriam Forehand, NP   10 mg at 03/24/24 1658    Lab Results:  Results for orders placed or performed during the hospital encounter of 03/14/24 (from the past 48 hours)  Aerobic/Anaerobic Culture w Gram Stain (surgical/deep wound)     Status: None (Preliminary result)   Collection Time: 03/23/24  8:15 AM   Specimen: Foot; Wound  Result Value Ref Range   Specimen Description      FOOT Performed at [facility], [address] [place], [place] [address]    Special Requests      LEFT FOOT Performed at [facility], [address] [place], [place] [address]    Gram Stain NO WBC SEEN RARE GRAM POSITIVE COCCI IN PAIRS     Culture      MODERATE STAPHYLOCOCCUS AUREUS SUSCEPTIBILITIES TO FOLLOW Performed at [facility], [address] [place], [place] [address]    Report Status PENDING   Glucose, capillary     Status:  Abnormal   Collection Time: 03/23/24  8:16 AM  Result Value Ref Range    Glucose-Capillary 124 (H) 70 - 99 mg/dL    Comment: Glucose reference range applies only to samples taken after fasting for at least 8 hours.  Glucose, capillary     Status: Abnormal   Collection Time: 03/24/24  7:37 AM  Result Value Ref Range   Glucose-Capillary 101 (H) 70 - 99 mg/dL    Comment: Glucose reference range applies only to samples taken after fasting for at least 8 hours.   Comment 1 Notify RN   Glucose, capillary     Status: Abnormal   Collection Time: 03/24/24 12:16 PM  Result Value Ref Range   Glucose-Capillary 134 (H) 70 - 99 mg/dL    Comment: Glucose reference range applies only to samples taken after fasting for at least 8 hours.  Glucose, capillary     Status: Abnormal   Collection Time: 03/24/24  4:57 PM  Result Value Ref Range   Glucose-Capillary 123 (H) 70 - 99 mg/dL    Comment: Glucose reference range applies only to samples taken after fasting for at least 8 hours.  Glucose, capillary     Status: None   Collection Time: 03/24/24 11:38 PM  Result Value Ref Range   Glucose-Capillary 93 70 - 99 mg/dL    Comment: Glucose reference range applies only to samples taken after fasting for at least 8 hours.    Blood Alcohol level:  Lab Results  Component Value Date   ETH <10 10/01/2022   ETH <10 03/25/2020    Metabolic Disorder Labs: Lab Results  Component Value Date   HGBA1C 5.1 03/15/2024   MPG 99.67 03/15/2024   No results found for: "PROLACTIN" Lab Results  Component Value Date   CHOL 108 03/15/2024   TRIG 106 03/15/2024   HDL 39 (L) 03/15/2024   CHOLHDL 2.8 03/15/2024   VLDL 21 03/15/2024   LDLCALC 48 03/15/2024   LDLCALC 40 11/07/2022    Physical Findings: AIMS:  , ,  ,  ,    CIWA:    COWS:     Musculoskeletal: Strength & Muscle Tone: within normal limits Gait & Station: normal Patient leans: N/A  Psychiatric Specialty Exam:  Presentation  General Appearance:  Disheveled  Eye Contact: Good  Speech: Clear and Coherent; Normal  Rate  Speech Volume: Increased  Handedness: Right   Mood and Affect  Mood: Irritable  Affect: Labile; Inappropriate   Thought Process  Thought Processes: Coherent; Goal Directed; Linear  Descriptions of Associations:Intact  Orientation:Full (Time, Place and Person)  Thought Content:Logical  History of Schizophrenia/Schizoaffective disorder:No  Duration of Psychotic Symptoms:N/A  Hallucinations:Hallucinations: None   Ideas of Reference:None  Suicidal Thoughts:Suicidal Thoughts: No   Homicidal Thoughts:Homicidal Thoughts: No    Sensorium  Memory: Immediate Good; Recent Good; Remote Good  Judgment: Impaired  Insight: Fair   Art therapist  Concentration: Good  Attention Span: Good  Recall: Good  Fund of Knowledge: Fair  Language: Fair   Psychomotor Activity  Psychomotor Activity: Psychomotor Activity: Normal    Assets  Assets: Communication Skills; Desire for Improvement; Social Support   Sleep  Sleep: Sleep: Good     Physical Exam: Physical Exam Vitals and nursing note reviewed.  Constitutional:      Appearance: Normal appearance.  HENT:     Head: Normocephalic and atraumatic.  Pulmonary:     Effort: Pulmonary effort is normal.  Musculoskeletal:     Cervical back: Normal range  of motion.     Left lower leg: Edema present.  Skin:    General: Skin is dry.     Comments: cellulitis of the left foot and osteomyelitis of the right little finger,   Neurological:     Mental Status: He is alert and oriented to person, place, and time. Mental status is at baseline.  Psychiatric:        Attention and Perception: Attention and perception normal.        Mood and Affect: Mood normal. Affect is labile.        Speech: Speech normal.        Thought Content: Thought content normal.        Cognition and Memory: Cognition and memory normal.        Judgment: Judgment is impulsive.     Comments: Thought process is  simplistic     Review of Systems  Skin:        cellulitis of the left foot and osteomyelitis of the right little finger,   Psychiatric/Behavioral:  Positive for substance abuse.   All other systems reviewed and are negative.  Blood pressure 113/75, pulse 87, temperature 98 F (36.7 C), resp. rate 19, height 5\' 9"  (1.753 m), weight 72.1 kg, SpO2 100%. Body mass index is 23.48 kg/m.   Treatment Plan Summary:  1.    Safety and Monitoring:   --  Voluntary admission to inpatient psychiatric unit for safety, stabilization and treatment -- Daily contact with patient to assess and evaluate symptoms and progress in treatment -- Patient's case to be discussed in multi-disciplinary team meeting -- Observation Level : q15 minute checks -- Vital signs:  q12 hours -- Precautions: suicide   2. Psychiatric Diagnoses and Treatment:  03/24/2024 -- Agrees Zyprexa 10 mg twice daily to 15 mg twice daily for mood stabilization/psychosis -- Continue hydroxyzine 25 mg TID PRN -- Continue melatonin 5 mg HS for sleep aid  -- Will need to discuss with team, to explore options of long-term inpatient substance use treatment, otherwise patient may have to be discharged to a shelter with proper wound education, supplies, provided oral antibiotics at discharge.  May also explore discussing with infectious disease and hospitalists possibility of admitting to medical unit for further medical stabilization once psychiatrically cleared.   03/23/2024 -- Continue Zyprexa 10 mg HS for mood/psychosis -- Continue hydroxyzine 25 mg TID PRN -- Start melatonin 5 mg HS for sleep aid  -- Will need to discuss with team, have family meeting to discuss safe dispo  03/15/2024 -- Continue Zyprexa 5 mg BID for mood/psychosis -- Continue hydroxyzine 25 mg TID PRN -- Start melatonin 5 mg HS for sleep aid  -- Labs ordered for HbA1c and lipid panel    --  The risks/benefits/side-effects/alternatives to this medication were  discussed in detail with the patient and time was given for questions. The patient consents to medication trial. -- Metabolic profile and EKG monitoring obtained while on an atypical antipsychotic  -- Encouraged patient to participate in unit milieu and in scheduled group therapies -- Short Term Goals: Ability to identify changes in lifestyle to reduce recurrence of condition will improve, Ability to verbalize feelings will improve, Ability to disclose and discuss suicidal ideas, Ability to demonstrate self-control will improve, Ability to identify and develop effective coping behaviors will improve, Ability to maintain clinical measurements within normal limits will improve, Compliance with prescribed medications will improve, and Ability to identify triggers associated with substance abuse/mental health issues will improve --  Long Term Goals: Improvement in symptoms so as ready for discharge        3. Medical Issues Being Addressed:               Hx of syphilis             -- labs for RPR, ID consult   -- no need for further treatment per chart review, RPR 1:32               AIDS             -- Continue home med Biktarvy 1 tab daily             -- ID consult - Labs for HIV 1 RNA, CD4 count   -- chart reviewed, recommend continue home med                Osteomyelitis  of R. Fifth digit             -- Per chart review underwent operative debridement 2/22 and saw hand surgery again on 3/ 5. Is followed by Dr. Lucianne Muss orthopedics             -- Wound care consult -- 3/24 chart review - Is being followed by Hospitalist, ID.  Cultures positive for MRSA, E. coli and strep.  MRI of fingers pending.  Per ID, D/c Augmentin today 3/24 , continue Zyvox (linezolid) 600 mg BID for 2-4 weeks, CBC once weekly while on Zyvox.  Recommendations by hospitalist to follow up with ortho.   Left foot cellulitis   -- first metatarsal phalangeal joint area with blister   -- seen by Podiatry: I&D performed, wound  cultures obtained, treated with Betadine, replaced for postoperative as well as daily dressing changes   Hyperglycemia   -- Sliding scale insulin initiated to manage hyperglycemia (possibly stress- or medication-induced).      4. Discharge Planning:   -- Social work and case management to assist with discharge planning and identification of hospital follow-up needs prior to discharge -- Estimated LOS: 5-7 days -- Discharge Concerns: Need to establish a safety plan; Medication compliance and effectiveness -- Discharge Goals: Return home with outpatient referrals for mental health follow-up including medication management/psychotherapy  Paulene Floor, PA-C 03/25/2024, 12:34 AM

## 2024-03-24 NOTE — Progress Notes (Signed)
   03/23/24 2226  Psych Admission Type (Psych Patients Only)  Admission Status Voluntary  Psychosocial Assessment  Patient Complaints Anxiety  Eye Contact Fair  Facial Expression Animated  Affect Appropriate to circumstance  Speech Logical/coherent  Interaction Assertive  Motor Activity Slow  Appearance/Hygiene Unremarkable  Behavior Characteristics Cooperative  Mood Anxious  Aggressive Behavior  Effect No apparent injury  Thought Process  Coherency WDL  Content WDL  Delusions None reported or observed  Perception WDL  Hallucination None reported or observed  Judgment WDL  Confusion None  Danger to Self  Current suicidal ideation? Denies  Self-Injurious Behavior No self-injurious ideation or behavior indicators observed or expressed   Agreement Not to Harm Self Yes  Danger to Others  Danger to Others None reported or observed

## 2024-03-24 NOTE — Group Note (Signed)
 Date:  03/24/2024 Time:  8:56 PM  Group Topic/Focus:  Emotional Education:   The focus of this group is to discuss what feelings/emotions are, and how they are experienced.    Participation Level:  Active  Participation Quality:  Appropriate, Attentive, Sharing, and Supportive  Affect:  Appropriate  Cognitive:  Alert and Appropriate  Insight: Appropriate and Good  Engagement in Group:  Developing/Improving, Engaged, and Supportive  Modes of Intervention:  Clarification, Discussion, Rapport Building, Socialization, and Support  Additional Comments:     [name] 03/24/2024, 8:56 PM

## 2024-03-25 ENCOUNTER — Other Ambulatory Visit (HOSPITAL_COMMUNITY): Payer: Self-pay

## 2024-03-25 ENCOUNTER — Other Ambulatory Visit: Payer: Self-pay

## 2024-03-25 DIAGNOSIS — B20 Human immunodeficiency virus [HIV] disease: Secondary | ICD-10-CM | POA: Diagnosis not present

## 2024-03-25 DIAGNOSIS — B9562 Methicillin resistant Staphylococcus aureus infection as the cause of diseases classified elsewhere: Secondary | ICD-10-CM | POA: Diagnosis not present

## 2024-03-25 DIAGNOSIS — L02612 Cutaneous abscess of left foot: Secondary | ICD-10-CM | POA: Diagnosis not present

## 2024-03-25 DIAGNOSIS — M86141 Other acute osteomyelitis, right hand: Secondary | ICD-10-CM | POA: Diagnosis not present

## 2024-03-25 LAB — GLUCOSE, CAPILLARY
Glucose-Capillary: 159 mg/dL — ABNORMAL HIGH (ref 70–99)
Glucose-Capillary: 132 mg/dL — ABNORMAL HIGH (ref 70–99)
Glucose-Capillary: 111 mg/dL — ABNORMAL HIGH (ref 70–99)

## 2024-03-25 MED ORDER — SULFAMETHOXAZOLE-TRIMETHOPRIM 800-160 MG PO TABS: 2.0000 | ORAL_TABLET | Freq: Two times a day (BID) | ORAL | Status: DC

## 2024-03-25 NOTE — Progress Notes (Signed)
 Patient's CBG was not done, prior to eating breakfast, so this writer did not administer any Insulin. Nor did this writer check CBG, being that he had already had breakfast.

## 2024-03-25 NOTE — Progress Notes (Signed)
   03/25/24 1600  Psych Admission Type (Psych Patients Only)  Admission Status Voluntary  Psychosocial Assessment  Patient Complaints None (patient reports "I'm good".)  Eye Contact Fair  Facial Expression Animated;Fixed smile  Affect Appropriate to circumstance  Speech Logical/coherent  Interaction Assertive  Motor Activity Slow;Other (Comment) (patient walks with a slight limp, due to foot injury/trauma.)  Appearance/Hygiene Disheveled;In scrubs;Poor hygiene  Behavior Characteristics Cooperative;Appropriate to situation  Mood Pleasant (patient's goal for today is to "focus".)  Aggressive Behavior  Effect No apparent injury  Thought Process  Coherency WDL  Content WDL  Delusions None reported or observed  Perception WDL  Hallucination None reported or observed  Judgment WDL  Confusion None  Danger to Self  Current suicidal ideation? Denies  Self-Injurious Behavior No self-injurious ideation or behavior indicators observed or expressed   Agreement Not to Harm Self Yes  Description of Agreement Verbal  Danger to Others  Danger to Others None reported or observed

## 2024-03-25 NOTE — [facility] Counselor (Signed)
 CSW has staffed case with [facility] supervisor with concerns that patient discharging to a shelter, including [facility] may not be a safe discharge as patient is in need for surgery to his hand to reportedly amputate a finger(s) on his hand.  Patient also reports issues on his foot as well. Patient has a list of additional medical concerns that are need to be addressed.    TOC Supervisor expressed concern of discharge as well and reported plans to speak with [facility] leadership.  Supervisor notes that patient may have to discharge to [facility] if no additional placement is found.  TOC Supervisor and this Clinical research associate reviewed the pts upcoming appointments to address the wound on patient's hand.   Unclear plan at this time to address patient's foot.  Penni Homans, MSW, LCSW 03/25/2024 3:11 PM

## 2024-03-25 NOTE — Group Note (Signed)
 [facility] LCSW Group Therapy Note   Group Date: 03/25/2024 Start Time: 1310 End Time: 1350   Type of Therapy/Topic:  Group Therapy:  Emotion Regulation  Participation Level:  Did Not Attend    Description of Group:    The purpose of this group is to assist patients in learning to regulate negative emotions and experience positive emotions. Patients will be guided to discuss ways in which they have been vulnerable to their negative emotions. These vulnerabilities will be juxtaposed with experiences of positive emotions or situations, and patients challenged to use positive emotions to combat negative ones. Special emphasis will be placed on coping with negative emotions in conflict situations, and patients will process healthy conflict resolution skills.  Therapeutic Goals: Patient will identify two positive emotions or experiences to reflect on in order to balance out negative emotions:  Patient will label two or more emotions that they find the most difficult to experience:  Patient will be able to demonstrate positive conflict resolution skills through discussion or role plays:   Summary of Patient Progress: Patient did not attend.    Therapeutic Modalities:   Cognitive Behavioral Therapy Feelings Identification Dialectical Behavioral Therapy   Glenis Smoker, LCSW

## 2024-03-25 NOTE — Progress Notes (Addendum)
 Date of Admission:  3/15/[facility]allow[facility]      ID: Mark Galloway is a 37 y.o. male Principal Problem:   Bipolar 1 disorder ([name]) Active Problems:   AIDS ([name])   Paranoid schizophrenia ([name])   Finger osteomyelitis ([name])   Cellulitis of foot, left   HTN (hypertension)   MRSA infection   Tenosynovitis of finger  Mark Galloway is a 38 y.o. with a history of Bipolar disorder, AIDS, treated syphilis , followed at [facility] for AIDS was admitted to [facility] for depression/anxiety and homelessness   Recently was in 02/21/24-02/24/24 admitted for abscess finger I/D  MRSA and took bactrim Followed by [facility]   Pt has been treated for syphilis with 3 doses of Benzathine penicillin on 03/08/23, 03/15/23 and 03/22/23. LP done then VDRL neg- 11 wbc That time RPR 1:32 HE then left [facility] and went to [facility]. Was hospitalized in [facility] in Aug 2024 and RPR then was 1:256- This was a new infection and he received one dose of Benzathine P 2.4 Mu Then on [facility] he was back in the hospital for diarrhea and RPR then was 1;128, appropriate response Pt has been taking Biktarvy for 3 months straight now HE was non complaint before. Says he is homeless  Subjective: Pt states he is feeling betetr and the wounds are looking better  Medications:   ascorbic acid  1,000 mg Oral Daily   bictegravir-emtricitabine-tenofovir AF  1 tablet Oral Daily   docusate sodium  100 mg Oral BID   insulin aspart  0-6 Units Subcutaneous TID WC   linezolid  600 mg Oral Q12H   melatonin  5 mg Oral QHS   nicotine  21 mg Transdermal Daily   nystatin  5 mL Oral QID   OLANZapine zydis  15 mg Oral BID   propranolol  10 mg Oral BID   [START ON 3/27/Galloway] sulfamethoxazole-trimethoprim  2 tablet Oral Q12H    Objective: Vital signs in last 24 hours: Patient Vitals for the past 24 hrs:  BP Temp Temp src Pulse Resp SpO2  03/25/24 0907 128/66 98.9 F (37.2 C) Oral 99 16 96 %  03/24/24 1715 113/75 98 F (36.7 C) -- 87 -- 100 %  03/24/24 1658  122/78 -- -- 80 -- --      PHYSICAL EXAM:  General: Alert, cooperative, no distress, appears stated age.  Lungs: Clear to auscultation bilaterally. No Wheezing or Rhonchi. No rales. Heart: Regular rate and rhythm, no murmur, rub or gallop. Abdomen: Soft, non-tender,not distended. Bowel sounds normal. No masses Extremities: rt hand Dressing not removed  Left foot dressing not removed     Skin: No rashes or lesions. Or bruising Lymph: Cervical, supraclavicular normal. Neurologic: Grossly non-focal  Lab Results    Latest Ref Rng & Units 3/23/Galloway    6:45 AM 3/22/Galloway    1:01 PM 3/19/Galloway    6:41 AM  CBC  WBC 4.0 - 10.5 K/uL 4.6  7.4  3.7   Hemoglobin 13.0 - 17.0 g/dL 04.5  40.9  81.1   Hematocrit 39.0 - 52.0 % 33.7  33.2  34.7   Platelets 150 - 400 K/uL 306  317  301        Latest Ref Rng & Units 3/23/Galloway    6:45 AM 3/19/Galloway    6:41 AM 3/15/Galloway    9:05 AM  CMP  Glucose 70 - 99 mg/dL 914  782  956   BUN 6 - 20 mg/dL 13  13  10   Creatinine 0.61 - 1.24 mg/dL 0.86  5.78  4.69   Sodium 135 - 145 mmol/L 134  135  133   Potassium 3.5 - 5.1 mmol/L 4.0  4.3  4.4   Chloride 98 - 111 mmol/L 104  105  102   CO2 22 - 32 mmol/L 23  24  21    Calcium 8.9 - 10.3 mg/dL 9.1  9.3  9.0   Total Protein 6.5 - 8.1 g/dL  8.3  7.6   Total Bilirubin 0.0 - 1.2 mg/dL  0.4  <6.2   Alkaline Phos 38 - 126 U/L  71  70   AST 15 - 41 U/L  22  23   ALT 0 - 44 U/L  16  12       Microbiology: Wound culture MRSA ESBL ecoli rare     Assessment/Plan: MRSA infection of the rt 5th finger S/p I/D in feb HE was non compliant with bactrim as OP because he was homeless Now the culture is MRSA + esbl ecoli ( rare- which is likely colonization or contamination)  MRI -osteo of the finger questioned Pt is currently on linezolid and augmentin- DC augmentin Will need linezolid for 2-4  weeks but need to make sure that Mark psych meds will not have any interatcions and put him at risk for serotonin  syndrome. While on linezolid need to advise him on food and meds to avoid- but patient has a copay of $ 100 for 2 weeks and he called Mark mom and they cannot afford it- so he will go on Bactrim DS 2 BID for 2 weeks Will need CBC/CMP once a week  while on bactrim HE will have to follow up with [facility] hand surgeon  Left foot MRSA abscess-   Pt is homeless and he has stated that if discharged he will not be able to take Mark meds because that would not be Mark priority .As per [facility] he is cleared for discharge  If sent to shelter need to make sure that he can have proper wound dressing , supplies and antibiotics  Spoke to Mark mother who stays in a motel and hence cannot take him.  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 Appropriate response now- it is 1;32 No further treatment or testing needed now Can check in 3 months at Mark Galloway's office   Bipolar disorder Schizophrenia   Discussed the management with patient, Psychiatrist NP [facility], Spoke to [facility] and he will see him next week Spoke to the pharmacist who will deliver meds to him at bed side

## 2024-03-25 NOTE — BHH Counselor (Signed)
 CSW left HIPAA compliant voicemail with [employer].    CSW called and spoke with [facility] front desk who report they do not accept "on the spot" admissions. They report that they only accept "on the spot" admissions Wednesday beginning at 7AM.  CSW was provided the following information to get the patient on the list for a bed at [facility], [phone] ext. [phone].  CSW placed patient on waitlist for a bed.    CSW contacted [facility], they have no beds.  CSW contacted [facility], [phone] as they accept "on the spot admissions".  CSW left HIPAA compliant voicemail requesting return phone call.   Penni Homans, MSW, LCSW 03/25/2024 3:31 PM

## 2024-03-25 NOTE — Plan of Care (Signed)
   Problem: Education: Goal: Emotional status will improve Outcome: Progressing Goal: Mental status will improve Outcome: Progressing Goal: Verbalization of understanding the information provided will improve Outcome: Progressing   Problem: Activity: Goal: Interest or engagement in activities will improve Outcome: Progressing Goal: Sleeping patterns will improve Outcome: Progressing

## 2024-03-25 NOTE — Progress Notes (Signed)
   03/25/24 2100  Psychosocial Assessment  Patient Complaints None  Eye Contact Fair  Facial Expression Animated  Affect Appropriate to circumstance  Speech Logical/coherent  Interaction Assertive  Motor Activity Slow  Appearance/Hygiene Disheveled  Behavior Characteristics Appropriate to situation;Cooperative  Mood Pleasant  Aggressive Behavior  Effect No apparent injury  Thought Process  Coherency WDL  Content WDL  Delusions None reported or observed  Perception WDL  Hallucination None reported or observed  Judgment WDL  Confusion None  Danger to Self  Current suicidal ideation? Denies  Self-Injurious Behavior No self-injurious ideation or behavior indicators observed or expressed   Agreement Not to Harm Self Yes  Description of Agreement verbal  Danger to Others  Danger to Others None reported or observed   Patient pleasant and cooperative. Request prn for pain to hand. Effective. Patient currently in bed resting in no distress. Visible in milieu, socializing with peers. Calm and cooperative. Encouragement and support provided. Safety checks maintained. Meds given as prescribed. Pt receptive and remains safe on unit with q 15 min checks.

## 2024-03-25 NOTE — Consult Note (Addendum)
 WOC contacted for assistance with wound care teaching and supplies for patient for DC. Ordered supplies from materials for patient   4x4 gauze (I don't know the lawson #) non sterile 4 boxes  Vashe [place] [phone]) 2 bottles Hydrogel [place] [phone])  8 each Conform gauze (Lawson# [phone] or [phone]) Plastic or paper tape (I do not know the number for this)    DC wound instructions.  FINGER 1.Take dressing off 2. Apply small amount of Vashe tsolution to a gauze pad, apply to wound for 1-2 minutes 3. Apply hydrogel from the bottle to the wound, cover with dry gauze, wrap with gauze. Secure with tape. 4. Change every day  FOOT Take dressing off Cleanse with soap and water; shower Cut to fit small piece of Xeroform (yellow package) and place over foot wound, ok to save remainder and leave in package in a clean place Cover with foam dressing   Jehieli Brassell Eliberto Ivory MSN,RN,CWOCN, CNS, CWON-AP   Discussed foot wound with Dr. Excell Seltzer, WOC has not been involved with foot wound Orders to cover with single layer of xerform, top with foam. Change daily.

## 2024-03-25 NOTE — Progress Notes (Signed)
 [facility] MD Progress Note  03/25/2024 11:23 AM [facility]  MRN:  [id number]  38 year old never married African-American male with reported history of bipolar disorder, stimulant use disorder, medical history of AIDS, syphilis, cellulitis of right fifth digit with recent debridement, presented to [facility] on [facility]13 accompanied by his mother, with chief complaint of depression and anxiety, thoughts of wanting to hurt manager at [facility] weeks the day prior to presenting, lack of sleep, homelessness, abnormal behaviors such as going into people's homes uninvited as well as their vehicles.    Subjective:   Patient's case discussed with multidisciplinary team, all vitals and notes were reviewed.  No reported behavioral issues overnight.  Patient is seen for reassessment, he reports that he is "pretty good".  Sleep and appetite are good.  Denies any depression, anxiety.  Denies SI/HI and AVH.  No other concerns at this time.  Per social work team, patient reported earlier today that he would be able to go stay with his grandmother, social work team was able to contact the patient's grandmother and confirmed that the patient could go stay with her.  However, the patient's mother did contact social work team letting them know that [facility]'s grandmother resides in an assisted living facility and that he would not be able to reside there with her.  Patient has been denied by multiple long-term inpatient rehab facilities, which is the recommended disposition from a psychiatric perspective, due to medical comorbidities, wounds.  Social work team will attempt to possibly get him into a shelter, however this may not be possible.  Disposition continues to be complicated.  Infectious disease team has expressed their concern that the patient may not be able to appropriately care for wounds, maintain adherence to the required oral antibiotic therapy.  Multiple discussions with hospitalist, infectious disease, has determined  that patient does not meet criteria for admission to inpatient medical unit.  Recommendations for the patient to follow up with outpatient orthopedic surgeon, infectious disease, PCP, continue oral antibiotics as prescribed, wound care as instructed.  Have also contacted wound care specialist, requesting need for wound care education to ensure that the patient is able to care for his wounds.  I have also requested that the patient be provided with supplies for wound care prior to his discharge.  Principal Problem: Bipolar 1 disorder ([name]) Diagnosis: Principal Problem:   Bipolar 1 disorder ([name]) Active Problems:   AIDS ([name])   Paranoid schizophrenia ([name])   Finger osteomyelitis ([name])   Cellulitis of foot, left   HTN (hypertension)   MRSA infection   Tenosynovitis of finger  Total Time spent with patient: 1 hr   Past Psychiatric History:  DX: Per chart review-bipolar disorder with psychotic features Outpatient provider: None Current caregiver:  Patient is own guardian/ care giver Past hospitalizations: [facility] 2021 for mania and psychosis, patient reports in 2024 at a facility in [place] x 3 weeks Medication trials : Zyprexa, per chart review-lithium Suicide attempts: Patient denies Patient denies ever having an Act/CST team. Denies ECT, Clozaril treatments.  Past Medical History:  Past Medical History:  Diagnosis Date   Bipolar 1 disorder ([name])    HIV infection ([name])    Schizophrenia ([name])    Shingles     Past Surgical History:  Procedure Laterality Date   I & D EXTREMITY Right 02/22/2024   Procedure: IRRIGATION AND DEBRIDEMENT EXTREMITY;  Surgeon: Mark Loa, MD;  Location: [facility] OR;  Service: Orthopedics;  Laterality: Right;   IRRIGATION AND DEBRIDEMENT FOOT  Left    Family History: History reviewed. No pertinent family history. Family Psychiatric  History:  Patient reports maternal grandmother-schizophrenia Patient denies any family history of suicide, substance use  disorder  Social History:  Social History   Substance and Sexual Activity  Alcohol Use Yes   Comment: 3-4 per week     Social History   Substance and Sexual Activity  Drug Use Never    Social History   Socioeconomic History   Marital status: Single    Spouse name: Not on file   Number of children: 0   Years of education: 16   Highest education level: Not on file  Occupational History   Occupation: Public relations account executive  Tobacco Use   Smoking status: Some Days    Types: Cigars, Cigarettes   Smokeless tobacco: Never   Tobacco comments:    2 per week  Vaping Use   Vaping status: Never Used  Substance and Sexual Activity   Alcohol use: Yes    Comment: 3-4 per week   Drug use: Never   Sexual activity: Not Currently    Comment: accepted condoms  Other Topics Concern   Not on file  Social History Narrative   Not on file   Social Drivers of Health   Financial Resource Strain: High Risk (01/04/2024)   [facility] from [facility]   Overall Financial Resource Strain (CARDIA)    Difficulty of Paying Living Expenses: Very hard  Food Insecurity: Food Insecurity Present (03/14/2024)   Hunger Vital Sign    Worried About Running Out of Food in the Last Year: Sometimes true    Ran Out of Food in the Last Year: Sometimes true  Transportation Needs: No Transportation Needs (03/14/2024)   PRAPARE - Administrator, Civil Service (Medical): No    Lack of Transportation (Non-Medical): No  Recent Concern: Transportation Needs - Unmet Transportation Needs (02/23/2024)   PRAPARE - Administrator, Civil Service (Medical): Yes    Lack of Transportation (Non-Medical): Yes  Physical Activity: Not on file  Stress: Not on file  Social Connections: Unknown (05/04/2022)   [facility]   Social Network    Social Network: Not on file   Additional Social History:                         Sleep: Good  Appetite:  Good  Current  Medications: Current Facility-Administered Medications  Medication Dose Route Frequency Provider Last Rate Last Admin   acetaminophen (TYLENOL) tablet 650 mg  650 mg Oral Q6H PRN Lorretta Harp, MD   650 mg at 03/24/24 1513   alum & mag hydroxide-simeth (MAALOX/MYLANTA) 200-200-20 MG/5ML suspension 30 mL  30 mL Oral Q4H PRN Aidden Markovic, PA-C       ascorbic acid (VITAMIN C) tablet 1,000 mg  1,000 mg Oral Daily Bentley Fissel, PA-C   1,000 mg at 03/25/24 0853   atovaquone (MEPRON) 750 MG/5ML suspension 1,500 mg  1,500 mg Oral Q breakfast Ravishankar, Rhodia Albright, MD   1,500 mg at 03/25/24 0852   bictegravir-emtricitabine-tenofovir AF (BIKTARVY) 50-200-25 MG per tablet 1 tablet  1 tablet Oral Daily Kasper Mudrick, PA-C   1 tablet at 03/25/24 0853   haloperidol (HALDOL) tablet 5 mg  5 mg Oral TID PRN Tashera Montalvo, PA-C   5 mg at 03/23/24 1743   And   diphenhydrAMINE (BENADRYL) capsule 50 mg  50 mg Oral TID PRN Joanmarie Tsang, Judeth Cornfield,  PA-C   50 mg at 03/23/24 1743   haloperidol lactate (HALDOL) injection 5 mg  5 mg Intramuscular TID PRN Honest Vanleer, Judeth Cornfield, PA-C       And   diphenhydrAMINE (BENADRYL) injection 50 mg  50 mg Intramuscular TID PRN Jamyah Folk, PA-C       And   LORazepam (ATIVAN) injection 2 mg  2 mg Intramuscular TID PRN Monda Chastain, PA-C       haloperidol lactate (HALDOL) injection 10 mg  10 mg Intramuscular TID PRN Gita Dilger, PA-C       And   diphenhydrAMINE (BENADRYL) injection 50 mg  50 mg Intramuscular TID PRN Benney Sommerville, PA-C       And   LORazepam (ATIVAN) injection 2 mg  2 mg Intramuscular TID PRN Alesia Oshields, PA-C   2 mg at 03/23/24 1743   docusate sodium (COLACE) capsule 100 mg  100 mg Oral BID Sakiya Stepka, PA-C   100 mg at 03/25/24 6578   hydrALAZINE (APRESOLINE) tablet 50 mg  50 mg Oral Q2H PRN Lorretta Harp, MD       hydrOXYzine (ATARAX) tablet 25 mg  25 mg Oral TID PRN Merideth Bosque, PA-C   25 mg at  03/24/24 1701   ibuprofen (ADVIL) tablet 800 mg  800 mg Oral Q6H PRN Audriella Blakeley, PA-C   800 mg at 03/25/24 0914   insulin aspart (novoLOG) injection 0-6 Units  0-6 Units Subcutaneous TID WC Myriam Forehand, NP   1 Units at 03/22/24 1130   linezolid (ZYVOX) tablet 600 mg  600 mg Oral Q12H Mikey College T, MD   600 mg at 03/25/24 4696   magnesium hydroxide (MILK OF MAGNESIA) suspension 30 mL  30 mL Oral Daily PRN Jacklin Zwick, PA-C       melatonin tablet 5 mg  5 mg Oral QHS Zamyra Allensworth, PA-C   5 mg at 03/24/24 2317   nicotine (NICODERM CQ - dosed in mg/24 hours) patch 21 mg  21 mg Transdermal Daily Lorretta Harp, MD   21 mg at 03/24/24 0741   nystatin (MYCOSTATIN) 100000 UNIT/ML suspension 500,000 Units  5 mL Oral QID Sundiata Ferrick, PA-C   500,000 Units at 03/25/24 0852   OLANZapine zydis (ZYPREXA) disintegrating tablet 15 mg  15 mg Oral BID Kamrie Fanton, PA-C   15 mg at 03/25/24 0853   propranolol (INDERAL) tablet 10 mg  10 mg Oral BID Myriam Forehand, NP   10 mg at 03/25/24 2952    Lab Results:  Results for orders placed or performed during the [facility] encounter of 03/14/24 (from the past 48 hours)  Glucose, capillary     Status: Abnormal   Collection Time: 03/24/24  7:37 AM  Result Value Ref Range   Glucose-Capillary 101 (H) 70 - 99 mg/dL    Comment: Glucose reference range applies only to samples taken after fasting for at least 8 hours.   Comment 1 Notify RN   Glucose, capillary     Status: Abnormal   Collection Time: 03/24/24 12:16 PM  Result Value Ref Range   Glucose-Capillary 134 (H) 70 - 99 mg/dL    Comment: Glucose reference range applies only to samples taken after fasting for at least 8 hours.  Glucose, capillary     Status: Abnormal   Collection Time: 03/24/24  4:57 PM  Result Value Ref Range   Glucose-Capillary 123 (H) 70 - 99 mg/dL    Comment: Glucose reference range applies only to samples taken after fasting  for at least 8 hours.  Glucose,  capillary     Status: None   Collection Time: 03/24/24 11:38 PM  Result Value Ref Range   Glucose-Capillary 93 70 - 99 mg/dL    Comment: Glucose reference range applies only to samples taken after fasting for at least 8 hours.  Glucose, capillary     Status: Abnormal   Collection Time: 03/25/24 11:54 AM  Result Value Ref Range   Glucose-Capillary 159 (H) 70 - 99 mg/dL    Comment: Glucose reference range applies only to samples taken after fasting for at least 8 hours.    Blood Alcohol level:  Lab Results  Component Value Date   ETH <10 10/01/2022   ETH <10 03/25/2020    Metabolic Disorder Labs: Lab Results  Component Value Date   HGBA1C 5.1 03/15/2024   MPG 99.67 03/15/2024   No results found for: "PROLACTIN" Lab Results  Component Value Date   CHOL 108 03/15/2024   TRIG 106 03/15/2024   HDL 39 (L) 03/15/2024   CHOLHDL 2.8 03/15/2024   VLDL 21 03/15/2024   LDLCALC 48 03/15/2024   LDLCALC 40 11/07/2022    Physical Findings: AIMS:  , ,  ,  ,    CIWA:    COWS:     Musculoskeletal: Strength & Muscle Tone: within normal limits Gait & Station: normal Patient leans: N/A  Psychiatric Specialty Exam:  Presentation  General Appearance:  Disheveled  Eye Contact: Good  Speech: Clear and Coherent; Normal Rate  Speech Volume: Increased  Handedness: Right   Mood and Affect  Mood: Irritable  Affect: Labile; Inappropriate   Thought Process  Thought Processes: Coherent; Goal Directed; Linear  Descriptions of Associations:Intact  Orientation:Full (Time, Place and Person)  Thought Content:Logical  History of Schizophrenia/Schizoaffective disorder:No  Duration of Psychotic Symptoms:N/A  Hallucinations:Hallucinations: None   Ideas of Reference:None  Suicidal Thoughts:Suicidal Thoughts: No   Homicidal Thoughts:Homicidal Thoughts: No    Sensorium  Memory: Immediate Good; Recent Good; Remote  Good  Judgment: Impaired  Insight: Fair   Art therapist  Concentration: Good  Attention Span: Good  Recall: Good  Fund of Knowledge: Fair  Language: Fair   Psychomotor Activity  Psychomotor Activity: Psychomotor Activity: Normal    Assets  Assets: Communication Skills; Desire for Improvement; Social Support   Sleep  Sleep: Sleep: Good     Physical Exam: Physical Exam Vitals and nursing note reviewed.  Constitutional:      Appearance: Normal appearance.  HENT:     Head: Normocephalic and atraumatic.  Pulmonary:     Effort: Pulmonary effort is normal.  Musculoskeletal:     Cervical back: Normal range of motion.     Left lower leg: Edema present.  Skin:    General: Skin is dry.     Comments: cellulitis of the left foot and osteomyelitis of the right little finger,   Neurological:     Mental Status: He is alert and oriented to person, place, and time. Mental status is at baseline.  Psychiatric:        Attention and Perception: Attention and perception normal.        Mood and Affect: Mood and affect normal.        Speech: Speech normal.        Behavior: Behavior normal. Behavior is cooperative.        Thought Content: Thought content normal.        Cognition and Memory: Cognition and memory normal.  Judgment: Judgment is impulsive.    Review of Systems  Skin:        cellulitis of the left foot and osteomyelitis of the right little finger,   Psychiatric/Behavioral:  Positive for substance abuse.   All other systems reviewed and are negative.  Blood pressure 128/66, pulse 99, temperature 98.9 F (37.2 C), temperature source Oral, resp. rate 16, height 5\' 9"  (1.753 m), weight 72.1 kg, SpO2 96%. Body mass index is 23.48 kg/m.   Treatment Plan Summary:  1.    Safety and Monitoring:   --  Voluntary admission to inpatient psychiatric unit for safety, stabilization and treatment -- Daily contact with patient to assess and evaluate  symptoms and progress in treatment -- Patient's case to be discussed in multi-disciplinary team meeting -- Observation Level : q15 minute checks -- Vital signs:  q12 hours -- Precautions: suicide   2. Psychiatric Diagnoses and Treatment:  03/25/2024 -- Continue Zyprexa 15 mg twice daily for mood stabilization/psychosis -- Continue hydroxyzine 25 mg TID PRN -- Continue melatonin 5 mg HS for sleep aid  -- Will need to discuss with team, to explore options of long-term inpatient substance use treatment, otherwise patient may have to be discharged to a shelter with proper wound education, supplies, provided oral antibiotics at discharge.  May also explore discussing with infectious disease and hospitalists possibility of admitting to medical unit for further medical stabilization once psychiatrically cleared.  03/24/2024 -- Agrees Zyprexa 10 mg twice daily to 15 mg twice daily for mood stabilization/psychosis -- Continue hydroxyzine 25 mg TID PRN -- Continue melatonin 5 mg HS for sleep aid  -- Will need to discuss with team, to explore options of long-term inpatient substance use treatment, otherwise patient may have to be discharged to a shelter with proper wound education, supplies, provided oral antibiotics at discharge.  May also explore discussing with infectious disease and hospitalists possibility of admitting to medical unit for further medical stabilization once psychiatrically cleared.   03/23/2024 -- Continue Zyprexa 10 mg HS for mood/psychosis -- Continue hydroxyzine 25 mg TID PRN -- Start melatonin 5 mg HS for sleep aid  -- Will need to discuss with team, have family meeting to discuss safe dispo  03/15/2024 -- Continue Zyprexa 5 mg BID for mood/psychosis -- Continue hydroxyzine 25 mg TID PRN -- Start melatonin 5 mg HS for sleep aid  -- Labs ordered for HbA1c and lipid panel    --  The risks/benefits/side-effects/alternatives to this medication were discussed in detail with the  patient and time was given for questions. The patient consents to medication trial. -- Metabolic profile and EKG monitoring obtained while on an atypical antipsychotic  -- Encouraged patient to participate in unit milieu and in scheduled [facility] therapies -- Short Term Goals: Ability to identify changes in lifestyle to reduce recurrence of condition will improve, Ability to verbalize feelings will improve, Ability to disclose and discuss suicidal ideas, Ability to demonstrate self-control will improve, Ability to identify and develop effective coping behaviors will improve, Ability to maintain clinical measurements within normal limits will improve, Compliance with prescribed medications will improve, and Ability to identify triggers associated with substance abuse/mental health issues will improve -- Long Term Goals: Improvement in symptoms so as ready for discharge        3. Medical Issues Being Addressed:               Hx of syphilis             --  labs for RPR, ID consult   -- no need for further treatment per chart review, RPR 1:32               AIDS             -- Continue home med Biktarvy 1 tab daily             -- ID consult - Labs for HIV 1 RNA, CD4 count   -- chart reviewed, recommend continue home med                Osteomyelitis  of R. Fifth digit             -- Per chart review underwent operative debridement 2/22 and saw hand surgery again on 3/ 5. Is followed by Dr. Lucianne Muss orthopedics             -- Wound care consult -- 3/24 chart review - Is being followed by Hospitalist, ID.  Cultures positive for MRSA, E. coli and strep.  MRI of fingers pending.  Per ID, D/c Augmentin today 3/24 , continue Zyvox (linezolid) 600 mg BID for 2-4 weeks, CBC once weekly while on Zyvox.  Recommendations by hospitalist to follow up with ortho.   Left foot cellulitis   -- first metatarsal phalangeal joint area with blister   -- seen by Podiatry: I&D performed, wound cultures obtained, treated with  Betadine, replaced for postoperative as well as daily dressing changes   Hyperglycemia   -- Sliding scale insulin initiated to manage hyperglycemia (possibly stress- or medication-induced).      4. Discharge Planning:   -- Social work and case management to assist with discharge planning and identification of [facility] follow-up needs prior to discharge -- Estimated LOS: 5-7 days -- Discharge Concerns: Need to establish a safety plan; Medication compliance and effectiveness -- Discharge Goals: Return home with outpatient referrals for mental health follow-up including medication management/psychotherapy  Paulene Floor, PA-C 03/25/2024, 12:40 PM

## 2024-03-25 NOTE — [facility] Counselor (Signed)
 CSW spoke with [facility] admission team.   Team reported that due to the patient's "severe" medical needs, they will not be able to offer placement.   CSW has communicated this to team.   CSW team to continue to assess.     Reymundo Poll, MSW, LCSWA 03/25/2024 4:27 PM;

## 2024-03-25 NOTE — Plan of Care (Signed)
  Problem: Education: Goal: Knowledge of  General Education information/materials will improve Outcome: Progressing Goal: Emotional status will improve Outcome: Progressing Goal: Mental status will improve Outcome: Progressing Goal: Verbalization of understanding the information provided will improve Outcome: Progressing   Problem: Activity: Goal: Interest or engagement in activities will improve Outcome: Progressing Goal: Sleeping patterns will improve Outcome: Progressing   Problem: Coping: Goal: Ability to verbalize frustrations and anger appropriately will improve Outcome: Progressing Goal: Ability to demonstrate self-control will improve Outcome: Progressing   Problem: Health Behavior/Discharge Planning: Goal: Identification of resources available to assist in meeting health care needs will improve Outcome: Progressing Goal: Compliance with treatment plan for underlying cause of condition will improve Outcome: Progressing   Problem: Physical Regulation: Goal: Ability to maintain clinical measurements within normal limits will improve Outcome: Progressing   Problem: Safety: Goal: Periods of time without injury will increase Outcome: Progressing   Problem: Education: Goal: Ability to describe self-care measures that may prevent or decrease complications (Diabetes Survival Skills Education) will improve Outcome: Progressing Goal: Individualized Educational Video(s) Outcome: Progressing   Problem: Coping: Goal: Ability to adjust to condition or change in health will improve Outcome: Progressing   Problem: Fluid Volume: Goal: Ability to maintain a balanced intake and output will improve Outcome: Progressing   Problem: Health Behavior/Discharge Planning: Goal: Ability to identify and utilize available resources and services will improve Outcome: Progressing Goal: Ability to manage health-related needs will improve Outcome: Progressing   Problem:  Metabolic: Goal: Ability to maintain appropriate glucose levels will improve Outcome: Progressing   Problem: Nutritional: Goal: Maintenance of adequate nutrition will improve Outcome: Progressing Goal: Progress toward achieving an optimal weight will improve Outcome: Progressing   Problem: Skin Integrity: Goal: Risk for impaired skin integrity will decrease Outcome: Progressing   Problem: Tissue Perfusion: Goal: Adequacy of tissue perfusion will improve Outcome: Progressing   Problem: Clinical Measurements: Goal: Ability to avoid or minimize complications of infection will improve Outcome: Progressing   Problem: Skin Integrity: Goal: Skin integrity will improve Outcome: Progressing

## 2024-03-25 NOTE — [facility] Counselor (Signed)
 CSW engaged in discharge planning with the patient. Patient plans to discharge to his grandmother, Dorothy's home. CSW touched base with grandmother at [phone] who confirmed that patient can discharge to her home and she is ready to receive patient.   Discharge address is: [address]  [place], [place] [address]/   This has been communicated to team.  CSW to continue to assess.    Reymundo Poll, MSW, LCSWA 03/25/2024 11:28 AM

## 2024-03-25 NOTE — [facility] Counselor (Signed)
 CSW received call from patient mother.   Per mother the patient can NOT stay with grandmother as the grandmother resides in an Assisted Living.  Mother reports that pt "manipulated" grandmother into believing that mother would pick pt up this evening and stay would be a short one.   Mother reports that she will NOT pick patient up.  She reports that patient can NOT stay with her as well.   Mother reports that she does not believe that pt will return to the [facility] once discharged.  She reports belief that patient will "return to the street and start using".   Penni Homans, MSW, LCSW 03/25/2024 3:08 PM

## 2024-03-25 NOTE — Progress Notes (Signed)
 Pt rested through the night and had no complaints.  Wound care was performed this morning per orders.  Pt stated that his finger wound was looking much better.  Pt is generally calm and cooperative with no outbursts or agitation noted during the 7p to 7a shift.  Continued monitoring for safety.    03/25/24 0500  Psych Admission Type (Psych Patients Only)  Admission Status Voluntary  Psychosocial Assessment  Patient Complaints None  Eye Contact Fair  Facial Expression Animated  Affect Appropriate to circumstance  Speech Logical/coherent  Interaction Assertive  Motor Activity Slow  Appearance/Hygiene Unremarkable  Behavior Characteristics Cooperative  Mood Pleasant  Thought Process  Coherency WDL  Content WDL  Delusions None reported or observed  Perception WDL  Hallucination None reported or observed  Judgment WDL  Confusion WDL  Danger to Self  Current suicidal ideation? Denies  Danger to Others  Danger to Others None reported or observed

## 2024-03-25 NOTE — Group Note (Signed)
 Date:  03/25/2024 Time:  11:12 PM  Group Topic/Focus:  Wrap-Up Group:   The focus of this group is to help patients review their daily goal of treatment and discuss progress on daily workbooks.    Participation Level:  Active  Participation Quality:  Appropriate and Attentive  Affect:  Appropriate  Cognitive:  Alert and Appropriate  Insight: Appropriate and Good  Engagement in Group:  Developing/Improving and Engaged  Modes of Intervention:  Activity, Rapport Building, and Socialization  Additional Comments:     [facility] 03/25/2024, 11:12 PM

## 2024-03-25 NOTE — Group Note (Signed)
 Date:  03/25/2024 Time:  9:58 AM  Group Topic/Focus:  Dimensions of Wellness:   The focus of this group is to introduce the topic of wellness and discuss the role each dimension of wellness plays in total health.    Participation Level:  Did Not Attend   [facility] 03/25/2024, 9:58 AM

## 2024-03-25 NOTE — TOC Benefit Eligibility Note (Signed)
 Pharmacy Patient Advocate Encounter  Insurance verification completed.   The patient is insured through [facility] ADVANTAGE/RX ADVANCE   Ran test claim for Linezolid. Currently a quantity of 28 is a 14 day supply and the co-pay is $115.48  .  Ran test claim for Mepron (generic). Currently a quantity of 300 is a 30 day supply and the co-pay is $224.64  .  Ran test claim for [employer]. Currently a quantity of 30 is a 30 day supply and the co-pay is $3,913.53. Adding [employer] brings co-pay to $627.06 .  This test claim was processed through [facility]- copay amounts may vary at other pharmacies due to pharmacy/plan contracts, or as the patient moves through the different stages of their insurance plan.

## 2024-03-25 NOTE — Progress Notes (Signed)
 Patient's wound care was done right at shift change, from the previous shift's RN. This Clinical research associate did not measure wounds, due to wound care already being completed.   03/25/24 1500  Charting Type  Charting Type Shift assessment  Safety Check Verification  Has the RN verified the 15 minute safety check completion? Yes  Neurological  Neuro (WDL) WDL  Orientation Level Oriented X4  HEENT  HEENT (WDL) X  Throat Other (Comment) (patient taking scheduled Nystatin)  Respiratory  Respiratory (WDL) WDL (patient prophylactically taking Mepron)  Cardiac  Cardiac (WDL) WDL  Vascular  Vascular (WDL) WDL  Integumentary  Integumentary (WDL) X (see admission)  Braden Scale (Ages 8 and up)  Sensory Perceptions 4  Moisture 4  Activity 4  Mobility 3 (patient walking with a limp)  Nutrition 3  Friction and Shear 3  Braden Scale Score 21  Musculoskeletal  Musculoskeletal (WDL) X (see admission)  Assistive Device None  Musculoskeletal Details  RUE Limited movement  RUE Ortho/Supportive Device Ace wrap  Right Wrist Limited movement  Right Fingers Injury/trauma;Swelling  Left Foot Injury/trauma (raised spot to top of big toe area)  Gastrointestinal  Gastrointestinal (WDL) WDL  Last BM Date  03/25/24  GU Assessment  Genitourinary (WDL) WDL  Wound / Incision (Open or Dehisced) 03/20/24 Incision - Open Finger (Comment which one) Anterior;Right open  Date First Assessed/Time First Assessed: 03/20/24 0800   Wound Type: Incision - Open  Location: Finger (Comment which one)  Location Orientation: Anterior;Right  Wound Description (Comments): open  Dressing Changed Other (Comment) (wound care completed prior to shift, right at shift change this morning.)  Dressing Status Clean, Dry, Intact  Dressing Change Frequency Daily  Wound / Incision (Open or Dehisced) 03/24/24 Non-pressure wound Foot Anterior;Left open pink red yellow see images  Date First Assessed/Time First Assessed: 03/24/24 0800    Wound Type: Non-pressure wound  Location: Foot  Location Orientation: Anterior;Left  Wound Description (Comments): open pink red yellow see images  Present on Admission: No  Dressing Changed Other (Comment) (wound care completed prior to shift, right at shift change this morning.)  Dressing Status Clean, Dry, Intact  Dressing Change Frequency Daily  Neurological  Level of Consciousness Alert

## 2024-03-25 NOTE — BHH Counselor (Signed)
 CSW touched base with [facility] admission team.   Team reported that due to the patient's "severe" medical needs, they will not be able to offer placement as they "are not a medical facility" and would not be able to "meet the patient's needs" primarily with the patient's MRSA.   CSW has communicated this to team.    CSW team to continue to assess.  Reymundo Poll, MSW, LCSWA 03/25/2024 4:34 PM

## 2024-03-26 DIAGNOSIS — F319 Bipolar disorder, unspecified: Secondary | ICD-10-CM | POA: Diagnosis not present

## 2024-03-26 LAB — CBC WITH DIFFERENTIAL/PLATELET
Abs Immature Granulocytes: 0.02 10*3/uL (ref 0.00–0.07)
Basophils Absolute: 0 10*3/uL (ref 0.0–0.1)
Basophils Relative: 0 %
Eosinophils Absolute: 0.4 10*3/uL (ref 0.0–0.5)
Eosinophils Relative: 9 %
HCT: 33 % — ABNORMAL LOW (ref 39.0–52.0)
Hemoglobin: 11 g/dL — ABNORMAL LOW (ref 13.0–17.0)
Immature Granulocytes: 0 %
Lymphocytes Relative: 37 %
Lymphs Abs: 1.6 10*3/uL (ref 0.7–4.0)
MCH: 32.4 pg (ref 26.0–34.0)
MCHC: 33.3 g/dL (ref 30.0–36.0)
MCV: 97.1 fL (ref 80.0–100.0)
Monocytes Absolute: 0.7 10*3/uL (ref 0.1–1.0)
Monocytes Relative: 15 %
Neutro Abs: 1.7 10*3/uL (ref 1.7–7.7)
Neutrophils Relative %: 39 %
Platelets: 362 10*3/uL (ref 150–400)
RBC: 3.4 MIL/uL — ABNORMAL LOW (ref 4.22–5.81)
RDW: 14.6 % (ref 11.5–15.5)
WBC: 4.5 10*3/uL (ref 4.0–10.5)
nRBC: 0 % (ref 0.0–0.2)

## 2024-03-26 LAB — CULTURE, BLOOD (ROUTINE X 2)
Culture: NO GROWTH
Culture: NO GROWTH

## 2024-03-26 LAB — GLUCOSE, CAPILLARY: Glucose-Capillary: 110 mg/dL — ABNORMAL HIGH (ref 70–99)

## 2024-03-26 MED ORDER — LINEZOLID 600 MG PO TABS
600.0000 mg | ORAL_TABLET | Freq: Two times a day (BID) | ORAL | Status: DC
  Administered 2024-03-26 – 2024-03-31 (×10): 600 mg via ORAL
  Filled 2024-03-26 (×10): qty 1

## 2024-03-26 NOTE — Plan of Care (Signed)
  Problem: Education: Goal: Knowledge of [facility] Education information/materials will improve Outcome: Progressing Goal: Emotional status will improve Outcome: Progressing Goal: Mental status will improve Outcome: Progressing   Problem: Activity: Goal: Interest or engagement in activities will improve Outcome: Progressing   Problem: Coping: Goal: Ability to verbalize frustrations and anger appropriately will improve Outcome: Progressing

## 2024-03-26 NOTE — Progress Notes (Signed)
   03/26/24 1300  Psych Admission Type (Psych Patients Only)  Admission Status Voluntary  Psychosocial Assessment  Eye Contact Fair  Facial Expression Animated  Affect Appropriate to circumstance  Speech Logical/coherent  Interaction Assertive  Motor Activity Slow  Appearance/Hygiene Poor hygiene;In scrubs  Behavior Characteristics Other (Comment) (uncooperative in following redirections)  Mood Anxious  Thought Process  Coherency WDL  Content WDL  Delusions None reported or observed  Perception WDL  Hallucination None reported or observed  Judgment WDL  Confusion None  Danger to Self  Current suicidal ideation? Denies  Self-Injurious Behavior No self-injurious ideation or behavior indicators observed or expressed   Agreement Not to Harm Self Yes  Description of Agreement verbal  Danger to Others  Danger to Others None reported or observed

## 2024-03-26 NOTE — Progress Notes (Signed)
 Pt calm and pleasant during assessment denying SI/HI/AVH. Pt compliant with medication administration per MD orders. Pt given education, support, and encouragement to be active in his treatment plan. Pt being monitored Q 15 minutes for safety per unit protocol, remains safe on the unit

## 2024-03-26 NOTE — Group Note (Signed)
 LCSW Group Therapy Note   Group Date: 03/26/2024 Start Time: 1300 End Time: 1400   Type of Therapy and Topic:  Group Therapy: Challenging Core Beliefs  Participation Level:  Did Not Attend  Description of Group:  Patients were educated about core beliefs and asked to identify one harmful core belief that they have. Patients were asked to explore from where those beliefs originate. Patients were asked to discuss how those beliefs make them feel and the resulting behaviors of those beliefs. They were then be asked if those beliefs are true and, if so, what evidence they have to support them. Lastly, group members were challenged to replace those negative core beliefs with helpful beliefs.   Therapeutic Goals:   1. Patient will identify harmful core beliefs and explore the origins of such beliefs. 2. Patient will identify feelings and behaviors that result from those core beliefs. 3. Patient will discuss whether such beliefs are true. 4.  Patient will replace harmful core beliefs with helpful ones.  Summary of Patient Progress:  Patient did not attend.   Therapeutic Modalities: Cognitive Behavioral Therapy; Solution-Focused Therapy   [name], LCSWA 03/26/2024  1:42 PM

## 2024-03-26 NOTE — Group Note (Signed)
 Recreation Therapy Group Note   Group Topic:Goal Setting  Group Date: 03/26/2024 Start Time: 1000 End Time: 1100 Facilitators: Rosina Lowenstein, LRT, CTRS Location:  Craft Room  Group Description: Product/process development scientist. Patients were given many different magazines, a glue stick, markers, and a piece of cardstock paper. LRT and pts discussed the importance of having goals in life. LRT and pts discussed the difference between short-term and long-term goals, as well as what a SMART goal is. LRT encouraged pts to create a vision board, with images they picked and then cut out with safety scissors from the magazine, for themselves, that capture their short and long-term goals. LRT encouraged pts to show and explain their vision board to the group.   Goal Area(s) Addressed:  Patient will gain knowledge of short vs. long term goals.  Patient will identify goals for themselves. Patient will practice setting SMART goals. Patient will verbalize their goals to LRT and peers.   Affect/Mood: Appropriate   Participation Level: Active and Engaged   Participation Quality: Independent   Behavior: Appropriate, Calm, and Cooperative   Speech/Thought Process: Coherent   Insight: Good   Judgement: Good   Modes of Intervention: Art, Education, Exploration, and Support   Patient Response to Interventions:  Attentive, Engaged, Interested , and Receptive   Education Outcome:  Acknowledges education   Clinical Observations/Individualized Feedback: Mark Galloway was active in their participation of session activities and group discussion. Pt identified "I want to be a celebrity, I want to work on my credit, I want to get a car, and I want to go back to school ad open my own restaurant one day". Pt interacted well with LRT and peers duration of session.    Plan: Continue to engage patient in RT group sessions 2-3x/week.   Rosina Lowenstein, LRT, CTRS 03/26/2024 11:35 AM

## 2024-03-26 NOTE — Progress Notes (Signed)
 [facility] MD Progress Note  03/26/2024 11:23 AM Mark Galloway  MRN:  [id number]  38 year old never married African-American male with reported history of bipolar disorder, stimulant use disorder, medical history of AIDS, syphilis, cellulitis of right fifth digit with recent debridement, presented to [facility] on 3/13 accompanied by his mother, with chief complaint of depression and anxiety, thoughts of wanting to hurt manager at [employer] weeks the day prior to presenting, lack of sleep, homelessness, abnormal behaviors such as going into people's homes uninvited as well as their vehicles.    Subjective:   Patient's case discussed with multidisciplinary team, all vitals and notes were reviewed.  No reported behavioral issues overnight.  Patient is seen for reassessment, he reports that he is "pretty good".  Sleep and appetite are good.  Denies any depression, anxiety.  Denies SI/HI and AVH.  No other concerns at this time.  Per social work team, patient reported earlier today that he would be able to go stay with his grandmother, social work team was able to contact the patient's grandmother and confirmed that the patient could go stay with her.  However, the patient's mother did contact social work team letting them know that Trever's grandmother resides in an assisted living facility and that he would not be able to reside there with her.  Patient has been denied by multiple long-term inpatient rehab facilities, which is the recommended disposition from a psychiatric perspective, due to medical comorbidities, wounds.  Social work team will attempt to possibly get him into a shelter, however this may not be possible.  Disposition continues to be complicated.  Infectious disease team has expressed their concern that the patient may not be able to appropriately care for wounds, maintain adherence to the required oral antibiotic therapy.  Multiple discussions with hospitalist, infectious disease, has determined  that patient does not meet criteria for admission to inpatient medical unit.  Recommendations for the patient to follow up with outpatient orthopedic surgeon, infectious disease, PCP, continue oral antibiotics as prescribed, wound care as instructed.  Have also contacted wound care specialist, requesting need for wound care education to ensure that the patient is able to care for his wounds.  I have also requested that the patient be provided with supplies for wound care prior to his discharge.  Principal Problem: Bipolar 1 disorder ([name]) Diagnosis: Principal Problem:   Bipolar 1 disorder ([name]) Active Problems:   AIDS ([name])   Paranoid schizophrenia ([name])   Finger osteomyelitis ([name])   Cellulitis of foot, left   HTN (hypertension)   MRSA infection   Tenosynovitis of finger  Total Time spent with patient: 1 hr   Past Psychiatric History:  DX: Per chart review-bipolar disorder with psychotic features Outpatient provider: None Current caregiver:  Patient is own guardian/ care giver Past hospitalizations: [facility] 2021 for mania and psychosis, patient reports in 2024 at a facility in [place] x 3 weeks Medication trials : Zyprexa, per chart review-lithium Suicide attempts: Patient denies Patient denies ever having an Act/CST team. Denies ECT, Clozaril treatments.  Past Medical History:  Past Medical History:  Diagnosis Date   Bipolar 1 disorder ([name])    HIV infection ([name])    Schizophrenia ([name])    Shingles     Past Surgical History:  Procedure Laterality Date   I & D EXTREMITY Right 02/22/2024   Procedure: IRRIGATION AND DEBRIDEMENT EXTREMITY;  Surgeon: Betha Loa, MD;  Location: [facility] OR;  Service: Orthopedics;  Laterality: Right;   IRRIGATION AND DEBRIDEMENT FOOT  Left    Family History: History reviewed. No pertinent family history. Family Psychiatric  History:  Patient reports maternal grandmother-schizophrenia Patient denies any family history of suicide, substance use  disorder  Social History:  Social History   Substance and Sexual Activity  Alcohol Use Yes   Comment: 3-4 per week     Social History   Substance and Sexual Activity  Drug Use Never    Social History   Socioeconomic History   Marital status: Single    Spouse name: Not on file   Number of children: 0   Years of education: 16   Highest education level: Not on file  Occupational History   Occupation: Public relations account executive  Tobacco Use   Smoking status: Some Days    Types: Cigars, Cigarettes   Smokeless tobacco: Never   Tobacco comments:    2 per week  Vaping Use   Vaping status: Never Used  Substance and Sexual Activity   Alcohol use: Yes    Comment: 3-4 per week   Drug use: Never   Sexual activity: Not Currently    Comment: accepted condoms  Other Topics Concern   Not on file  Social History Narrative   Not on file   Social Drivers of Health   Financial Resource Strain: High Risk (01/04/2024)   Received from [facility]   Overall Financial Resource Strain (CARDIA)    Difficulty of Paying Living Expenses: Very hard  Food Insecurity: Food Insecurity Present (03/14/2024)   Hunger Vital Sign    Worried About Running Out of Food in the Last Year: Sometimes true    Ran Out of Food in the Last Year: Sometimes true  Transportation Needs: No Transportation Needs (03/14/2024)   PRAPARE - Administrator, Civil Service (Medical): No    Lack of Transportation (Non-Medical): No  Recent Concern: Transportation Needs - Unmet Transportation Needs (02/23/2024)   PRAPARE - Administrator, Civil Service (Medical): Yes    Lack of Transportation (Non-Medical): Yes  Physical Activity: Not on file  Stress: Not on file  Social Connections: Unknown (05/04/2022)   [facility]   Social Network    Social Network: Not on file   Additional Social History:                         Sleep: Good  Appetite:  Good  Current  Medications: Current Facility-Administered Medications  Medication Dose Route Frequency Provider Last Rate Last Admin   acetaminophen (TYLENOL) tablet 650 mg  650 mg Oral Q6H PRN Lorretta Harp, MD   650 mg at 03/26/24 1057   alum & mag hydroxide-simeth (MAALOX/MYLANTA) 200-200-20 MG/5ML suspension 30 mL  30 mL Oral Q4H PRN Gurtaj Ruz, PA-C       ascorbic acid (VITAMIN C) tablet 1,000 mg  1,000 mg Oral Daily Yasha Tibbett, PA-C   1,000 mg at 03/26/24 0916   bictegravir-emtricitabine-tenofovir AF (BIKTARVY) 50-200-25 MG per tablet 1 tablet  1 tablet Oral Daily Martita Brumm, PA-C   1 tablet at 03/26/24 3086   haloperidol (HALDOL) tablet 5 mg  5 mg Oral TID PRN Roiza Wiedel, PA-C   5 mg at 03/23/24 1743   And   diphenhydrAMINE (BENADRYL) capsule 50 mg  50 mg Oral TID PRN Snow Peoples, PA-C   50 mg at 03/23/24 1743   haloperidol lactate (HALDOL) injection 5 mg  5 mg Intramuscular TID PRN Dalani Mette, Judeth Cornfield, PA-C  And   diphenhydrAMINE (BENADRYL) injection 50 mg  50 mg Intramuscular TID PRN Kentrel Clevenger, PA-C       And   LORazepam (ATIVAN) injection 2 mg  2 mg Intramuscular TID PRN Karlena Luebke, PA-C       haloperidol lactate (HALDOL) injection 10 mg  10 mg Intramuscular TID PRN Pinkey Mcjunkin, PA-C       And   diphenhydrAMINE (BENADRYL) injection 50 mg  50 mg Intramuscular TID PRN Briasia Flinders, PA-C       And   LORazepam (ATIVAN) injection 2 mg  2 mg Intramuscular TID PRN Eniya Cannady, PA-C   2 mg at 03/23/24 1743   docusate sodium (COLACE) capsule 100 mg  100 mg Oral BID Jourdin Connors, PA-C   100 mg at 03/26/24 0454   hydrALAZINE (APRESOLINE) tablet 50 mg  50 mg Oral Q2H PRN Lorretta Harp, MD       hydrOXYzine (ATARAX) tablet 25 mg  25 mg Oral TID PRN Nalani Andreen, PA-C   25 mg at 03/24/24 1701   ibuprofen (ADVIL) tablet 800 mg  800 mg Oral Q6H PRN Bethel Gaglio, PA-C   800 mg at 03/25/24 2017   insulin aspart  (novoLOG) injection 0-6 Units  0-6 Units Subcutaneous TID WC Myriam Forehand, NP   1 Units at 03/25/24 1251   magnesium hydroxide (MILK OF MAGNESIA) suspension 30 mL  30 mL Oral Daily PRN Meera Vasco, PA-C       melatonin tablet 5 mg  5 mg Oral QHS Adair Lauderback, PA-C   5 mg at 03/25/24 2017   nicotine (NICODERM CQ - dosed in mg/24 hours) patch 21 mg  21 mg Transdermal Daily Lorretta Harp, MD   21 mg at 03/24/24 0741   nystatin (MYCOSTATIN) 100000 UNIT/ML suspension 500,000 Units  5 mL Oral QID Shamya Macfadden, PA-C   500,000 Units at 03/26/24 0909   OLANZapine zydis (ZYPREXA) disintegrating tablet 15 mg  15 mg Oral BID Maryjo Ragon, PA-C   15 mg at 03/26/24 0908   propranolol (INDERAL) tablet 10 mg  10 mg Oral BID Myriam Forehand, NP   10 mg at 03/26/24 0981   sulfamethoxazole-trimethoprim (BACTRIM DS) 800-160 MG per tablet 2 tablet  2 tablet Oral Q12H Lynn Ito, MD        Lab Results:  Results for orders placed or performed during the hospital encounter of 03/14/24 (from the past 48 hours)  Glucose, capillary     Status: Abnormal   Collection Time: 03/24/24 12:16 PM  Result Value Ref Range   Glucose-Capillary 134 (H) 70 - 99 mg/dL    Comment: Glucose reference range applies only to samples taken after fasting for at least 8 hours.  Glucose, capillary     Status: Abnormal   Collection Time: 03/24/24  4:57 PM  Result Value Ref Range   Glucose-Capillary 123 (H) 70 - 99 mg/dL    Comment: Glucose reference range applies only to samples taken after fasting for at least 8 hours.  Glucose, capillary     Status: None   Collection Time: 03/24/24 11:38 PM  Result Value Ref Range   Glucose-Capillary 93 70 - 99 mg/dL    Comment: Glucose reference range applies only to samples taken after fasting for at least 8 hours.  Glucose, capillary     Status: Abnormal   Collection Time: 03/25/24 11:54 AM  Result Value Ref Range   Glucose-Capillary 159 (H) 70 - 99 mg/dL     Comment:  Glucose reference range applies only to samples taken after fasting for at least 8 hours.  Glucose, capillary     Status: Abnormal   Collection Time: 03/25/24  5:05 PM  Result Value Ref Range   Glucose-Capillary 132 (H) 70 - 99 mg/dL    Comment: Glucose reference range applies only to samples taken after fasting for at least 8 hours.  Glucose, capillary     Status: Abnormal   Collection Time: 03/25/24  7:55 PM  Result Value Ref Range   Glucose-Capillary 111 (H) 70 - 99 mg/dL    Comment: Glucose reference range applies only to samples taken after fasting for at least 8 hours.  CBC with Differential/Platelet     Status: Abnormal   Collection Time: 03/26/24  6:31 AM  Result Value Ref Range   WBC 4.5 4.0 - 10.5 K/uL   RBC 3.40 (L) 4.22 - 5.81 MIL/uL   Hemoglobin 11.0 (L) 13.0 - 17.0 g/dL   HCT 91.4 (L) 78.2 - 95.6 %   MCV 97.1 80.0 - 100.0 fL   MCH 32.4 26.0 - 34.0 pg   MCHC 33.3 30.0 - 36.0 g/dL   RDW 21.3 08.6 - 57.8 %   Platelets 362 150 - 400 K/uL   nRBC 0.0 0.0 - 0.2 %   Neutrophils Relative % 39 %   Neutro Abs 1.7 1.7 - 7.7 K/uL   Lymphocytes Relative 37 %   Lymphs Abs 1.6 0.7 - 4.0 K/uL   Monocytes Relative 15 %   Monocytes Absolute 0.7 0.1 - 1.0 K/uL   Eosinophils Relative 9 %   Eosinophils Absolute 0.4 0.0 - 0.5 K/uL   Basophils Relative 0 %   Basophils Absolute 0.0 0.0 - 0.1 K/uL   Immature Granulocytes 0 %   Abs Immature Granulocytes 0.02 0.00 - 0.07 K/uL    Comment: Performed at [facility], [address] [place], [place] [address]    Blood Alcohol level:  Lab Results  Component Value Date   [facility] <10 10/01/2022   ETH <10 03/25/2020    Metabolic Disorder Labs: Lab Results  Component Value Date   HGBA1C 5.1 03/15/2024   MPG 99.67 03/15/2024   No results found for: "PROLACTIN" Lab Results  Component Value Date   CHOL 108 03/15/2024   TRIG 106 03/15/2024   HDL 39 (L) 03/15/2024   CHOLHDL 2.8 03/15/2024   VLDL 21 03/15/2024   LDLCALC  48 03/15/2024   LDLCALC 40 11/07/2022    Physical Findings: AIMS:  , ,  ,  ,    CIWA:    COWS:     Musculoskeletal: Strength & Muscle Tone: within normal limits Gait & Station: normal Patient leans: N/A  Psychiatric Specialty Exam:  Presentation  General Appearance:  Disheveled  Eye Contact: Good  Speech: Clear and Coherent; Normal Rate  Speech Volume: Normal  Handedness: Right   Mood and Affect  Mood: Euthymic  Affect: Appropriate   Thought Process  Thought Processes: Coherent; Goal Directed; Linear  Descriptions of Associations:Intact  Orientation:Full (Time, Place and Person)  Thought Content:Logical  History of Schizophrenia/Schizoaffective disorder:No  Duration of Psychotic Symptoms:N/A  Hallucinations:Hallucinations: None   Ideas of Reference:None  Suicidal Thoughts:Suicidal Thoughts: No   Homicidal Thoughts:Homicidal Thoughts: No    Sensorium  Memory: Immediate Good; Recent Good; Remote Good  Judgment: Fair  Insight: Fair   Art therapist  Concentration: Good  Attention Span: Good  Recall: Good  Fund of Knowledge: Fair  Language: Fair   Psychomotor  Activity  Psychomotor Activity: Psychomotor Activity: Normal    Assets  Assets: Communication Skills; Desire for Improvement; Social Support   Sleep  Sleep: Sleep: Good     Physical Exam: Physical Exam Vitals and nursing note reviewed.  Constitutional:      Appearance: Normal appearance.  HENT:     Head: Normocephalic and atraumatic.  Pulmonary:     Effort: Pulmonary effort is normal.  Musculoskeletal:     Cervical back: Normal range of motion.     Left lower leg: Edema present.  Skin:    General: Skin is dry.     Comments: cellulitis of the left foot and osteomyelitis of the right little finger,   Neurological:     Mental Status: He is alert and oriented to person, place, and time. Mental status is at baseline.  Psychiatric:         Attention and Perception: Attention and perception normal.        Mood and Affect: Mood and affect normal.        Speech: Speech normal.        Behavior: Behavior normal. Behavior is cooperative.        Thought Content: Thought content normal.        Cognition and Memory: Cognition and memory normal.        Judgment: Judgment is impulsive.    Review of Systems  Skin:        cellulitis of the left foot and osteomyelitis of the right little finger,   Psychiatric/Behavioral:  Positive for substance abuse.   All other systems reviewed and are negative.  Blood pressure 115/75, pulse 91, temperature (!) 97.3 F (36.3 C), resp. rate 16, height 5\' 9"  (1.753 m), weight 72.1 kg, SpO2 99%. Body mass index is 23.48 kg/m.   Treatment Plan Summary:  1.    Safety and Monitoring:   --  Voluntary admission to inpatient psychiatric unit for safety, stabilization and treatment -- Daily contact with patient to assess and evaluate symptoms and progress in treatment -- Patient's case to be discussed in multi-disciplinary team meeting -- Observation Level : q15 minute checks -- Vital signs:  q12 hours -- Precautions: suicide   2. Psychiatric Diagnoses and Treatment:  03/26/2024   03/25/2024 -- Continue Zyprexa 15 mg twice daily for mood stabilization/psychosis -- Continue hydroxyzine 25 mg TID PRN -- Continue melatonin 5 mg HS for sleep aid  -- Will need to discuss with team, to explore options of long-term inpatient substance use treatment, otherwise patient may have to be discharged to a shelter with proper wound education, supplies, provided oral antibiotics at discharge.  May also explore discussing with infectious disease and hospitalists possibility of admitting to medical unit for further medical stabilization once psychiatrically cleared.  03/24/2024 -- Agrees Zyprexa 10 mg twice daily to 15 mg twice daily for mood stabilization/psychosis -- Continue hydroxyzine 25 mg TID PRN -- Continue  melatonin 5 mg HS for sleep aid  -- Will need to discuss with team, to explore options of long-term inpatient substance use treatment, otherwise patient may have to be discharged to a shelter with proper wound education, supplies, provided oral antibiotics at discharge.  May also explore discussing with infectious disease and hospitalists possibility of admitting to medical unit for further medical stabilization once psychiatrically cleared.   03/23/2024 -- Continue Zyprexa 10 mg HS for mood/psychosis -- Continue hydroxyzine 25 mg TID PRN -- Start melatonin 5 mg HS for sleep aid  -- Will need to  discuss with team, have family meeting to discuss safe dispo  03/15/2024 -- Continue Zyprexa 5 mg BID for mood/psychosis -- Continue hydroxyzine 25 mg TID PRN -- Start melatonin 5 mg HS for sleep aid  -- Labs ordered for HbA1c and lipid panel    --  The risks/benefits/side-effects/alternatives to this medication were discussed in detail with the patient and time was given for questions. The patient consents to medication trial. -- Metabolic profile and EKG monitoring obtained while on an atypical antipsychotic  -- Encouraged patient to participate in unit milieu and in scheduled group therapies -- Short Term Goals: Ability to identify changes in lifestyle to reduce recurrence of condition will improve, Ability to verbalize feelings will improve, Ability to disclose and discuss suicidal ideas, Ability to demonstrate self-control will improve, Ability to identify and develop effective coping behaviors will improve, Ability to maintain clinical measurements within normal limits will improve, Compliance with prescribed medications will improve, and Ability to identify triggers associated with substance abuse/mental health issues will improve -- Long Term Goals: Improvement in symptoms so as ready for discharge        3. Medical Issues Being Addressed:               Hx of syphilis             -- labs for  RPR, ID consult   -- no need for further treatment per chart review, RPR 1:32               AIDS             -- Continue home med Biktarvy 1 tab daily             -- ID consult - Labs for HIV 1 RNA, CD4 count   -- chart reviewed, recommend continue home med                Osteomyelitis  of R. Fifth digit             -- Per chart review underwent operative debridement 2/22 and saw hand surgery again on 3/ 5. Is followed by Dr. Lucianne Muss orthopedics             -- Wound care consult -- 3/24 chart review - Is being followed by Hospitalist, ID.  Cultures positive for MRSA, E. coli and strep.  MRI of fingers pending.  Per ID, D/c Augmentin today 3/24 , continue Zyvox (linezolid) 600 mg BID for 2-4 weeks, CBC once weekly while on Zyvox.  Recommendations by hospitalist to follow up with ortho.   Left foot cellulitis   -- first metatarsal phalangeal joint area with blister   -- seen by Podiatry: I&D performed, wound cultures obtained, treated with Betadine, replaced for postoperative as well as daily dressing changes   Hyperglycemia   -- Sliding scale insulin initiated to manage hyperglycemia (possibly stress- or medication-induced).      4. Discharge Planning:   -- Social work and case management to assist with discharge planning and identification of hospital follow-up needs prior to discharge -- Estimated LOS: 5-7 days -- Discharge Concerns: Need to establish a safety plan; Medication compliance and effectiveness -- Discharge Goals: Return home with outpatient referrals for mental health follow-up including medication management/psychotherapy  Paulene Floor, PA-C 03/26/2024, 10:59 AM

## 2024-03-26 NOTE — [facility] Counselor (Signed)
 CSW spoke with Myna Hidalgo with [facility], [phone] ext [phone].  He reports that there is no bed available at this time.   She reports that there is a list that pt can be added to.   CSW added patient to waitlist and provided the patient's mother's number for contact when bed becomes available.   Greig Castilla reports that the list "moves pretty quickly".   Penni Homans, MSW, LCSW 03/26/2024 11:15 AM

## 2024-03-26 NOTE — BH IP Treatment Plan (Signed)
 Interdisciplinary Treatment and Diagnostic Plan Update  03/26/2024 Time of Session: 14:00 Mark Galloway MRN: [id number]  Principal Diagnosis: Bipolar 1 disorder ([name])  Secondary Diagnoses: Principal Problem:   Bipolar 1 disorder ([name]) Active Problems:   AIDS ([name])   Paranoid schizophrenia ([name])   Finger osteomyelitis ([name])   Cellulitis of foot, left   HTN (hypertension)   MRSA infection   Tenosynovitis of finger   Current Medications:  Current Facility-Administered Medications  Medication Dose Route Frequency Provider Last Rate Last Admin   acetaminophen (TYLENOL) tablet 650 mg  650 mg Oral Q6H PRN Lorretta Harp, MD   650 mg at 03/26/24 1057   alum & mag hydroxide-simeth (MAALOX/MYLANTA) 200-200-20 MG/5ML suspension 30 mL  30 mL Oral Q4H PRN Tingling, Stephanie, PA-C       ascorbic acid (VITAMIN C) tablet 1,000 mg  1,000 mg Oral Daily Tingling, Stephanie, PA-C   1,000 mg at 03/26/24 0916   bictegravir-emtricitabine-tenofovir AF (BIKTARVY) 50-200-25 MG per tablet 1 tablet  1 tablet Oral Daily Tingling, Stephanie, PA-C   1 tablet at 03/26/24 8295   haloperidol (HALDOL) tablet 5 mg  5 mg Oral TID PRN Tingling, Stephanie, PA-C   5 mg at 03/23/24 1743   And   diphenhydrAMINE (BENADRYL) capsule 50 mg  50 mg Oral TID PRN Tingling, Stephanie, PA-C   50 mg at 03/23/24 1743   haloperidol lactate (HALDOL) injection 5 mg  5 mg Intramuscular TID PRN Tingling, Stephanie, PA-C       And   diphenhydrAMINE (BENADRYL) injection 50 mg  50 mg Intramuscular TID PRN Tingling, Stephanie, PA-C       And   LORazepam (ATIVAN) injection 2 mg  2 mg Intramuscular TID PRN Tingling, Stephanie, PA-C       haloperidol lactate (HALDOL) injection 10 mg  10 mg Intramuscular TID PRN Tingling, Stephanie, PA-C       And   diphenhydrAMINE (BENADRYL) injection 50 mg  50 mg Intramuscular TID PRN Tingling, Stephanie, PA-C       And   LORazepam (ATIVAN) injection 2 mg  2 mg Intramuscular TID PRN Tingling, Stephanie, PA-C   2  mg at 03/23/24 1743   docusate sodium (COLACE) capsule 100 mg  100 mg Oral BID Tingling, Stephanie, PA-C   100 mg at 03/26/24 6213   hydrALAZINE (APRESOLINE) tablet 50 mg  50 mg Oral Q2H PRN Lorretta Harp, MD       hydrOXYzine (ATARAX) tablet 25 mg  25 mg Oral TID PRN Tingling, Stephanie, PA-C   25 mg at 03/24/24 1701   ibuprofen (ADVIL) tablet 800 mg  800 mg Oral Q6H PRN Tingling, Stephanie, PA-C   800 mg at 03/25/24 2017   insulin aspart (novoLOG) injection 0-6 Units  0-6 Units Subcutaneous TID WC Myriam Forehand, NP   1 Units at 03/25/24 1251   magnesium hydroxide (MILK OF MAGNESIA) suspension 30 mL  30 mL Oral Daily PRN Tingling, Stephanie, PA-C       melatonin tablet 5 mg  5 mg Oral QHS Tingling, Stephanie, PA-C   5 mg at 03/25/24 2017   nicotine (NICODERM CQ - dosed in mg/24 hours) patch 21 mg  21 mg Transdermal Daily Lorretta Harp, MD   21 mg at 03/24/24 0741   nystatin (MYCOSTATIN) 100000 UNIT/ML suspension 500,000 Units  5 mL Oral QID Tingling, Stephanie, PA-C   500,000 Units at 03/26/24 0909   OLANZapine zydis (ZYPREXA) disintegrating tablet 15 mg  15 mg Oral BID Tingling, Judeth Cornfield, PA-C  15 mg at 03/26/24 0908   propranolol (INDERAL) tablet 10 mg  10 mg Oral BID Myriam Forehand, NP   10 mg at 03/26/24 4098   sulfamethoxazole-trimethoprim (BACTRIM DS) 800-160 MG per tablet 2 tablet  2 tablet Oral Q12H Lynn Ito, MD       PTA Medications: Medications Prior to Admission  Medication Sig Dispense Refill Last Dose/Taking   bictegravir-emtricitabine-tenofovir AF (BIKTARVY) 50-200-25 MG TABS tablet Take 1 tablet by mouth daily. 30 tablet 0 Past Month   ascorbic acid (VITAMIN C) 1000 MG tablet Take 1 tablet (1,000 mg total) by mouth daily. (Patient not taking: Reported on 03/14/2024)   Not Taking   sulfamethoxazole-trimethoprim (BACTRIM DS) 800-160 MG tablet Take 1 tablet by mouth 2 (two) times daily. (Patient not taking: Reported on 03/14/2024) 10 tablet 0 Not Taking    Patient Stressors:     Patient Strengths:    Treatment Modalities: Medication Management, Group therapy, Case management,  1 to 1 session with clinician, Psychoeducation, Recreational therapy.   Physician Treatment Plan for Primary Diagnosis: Bipolar 1 disorder ([name]) Long Term Goal(s):     Short Term Goals:    Medication Management: Evaluate patient's response, side effects, and tolerance of medication regimen.  Therapeutic Interventions: 1 to 1 sessions, Unit Group sessions and Medication administration.  Evaluation of Outcomes: Progressing  Physician Treatment Plan for Secondary Diagnosis: Principal Problem:   Bipolar 1 disorder ([name]) Active Problems:   AIDS ([name])   Paranoid schizophrenia ([name])   Finger osteomyelitis ([name])   Cellulitis of foot, left   HTN (hypertension)   MRSA infection   Tenosynovitis of finger  Long Term Goal(s):     Short Term Goals:       Medication Management: Evaluate patient's response, side effects, and tolerance of medication regimen.  Therapeutic Interventions: 1 to 1 sessions, Unit Group sessions and Medication administration.  Evaluation of Outcomes: Progressing   RN Treatment Plan for Primary Diagnosis: Bipolar 1 disorder ([name]) Long Term Goal(s): Knowledge of disease and therapeutic regimen to maintain health will improve  Short Term Goals: Ability to remain free from injury will improve, Ability to verbalize frustration and anger appropriately will improve, Ability to demonstrate self-control, Ability to participate in decision making will improve, Ability to verbalize feelings will improve, Ability to disclose and discuss suicidal ideas, Ability to identify and develop effective coping behaviors will improve, and Compliance with prescribed medications will improve  Medication Management: RN will administer medications as ordered by provider, will assess and evaluate patient's response and provide education to patient for prescribed medication. RN will  report any adverse and/or side effects to prescribing provider.  Therapeutic Interventions: 1 on 1 counseling sessions, Psychoeducation, Medication administration, Evaluate responses to treatment, Monitor vital signs and CBGs as ordered, Perform/monitor CIWA, COWS, AIMS and Fall Risk screenings as ordered, Perform wound care treatments as ordered.  Evaluation of Outcomes: Progressing   LCSW Treatment Plan for Primary Diagnosis: Bipolar 1 disorder ([name]) Long Term Goal(s): Safe transition to appropriate next level of care at discharge, Engage patient in therapeutic group addressing interpersonal concerns.  Short Term Goals: Engage patient in aftercare planning with referrals and resources, Increase social support, Increase ability to appropriately verbalize feelings, Increase emotional regulation, Facilitate acceptance of mental health diagnosis and concerns, Facilitate patient progression through stages of change regarding substance use diagnoses and concerns, Identify triggers associated with mental health/substance abuse issues, and Increase skills for wellness and recovery  Therapeutic Interventions: Assess for all discharge needs, 1 to 1  time with Child psychotherapist, Explore available resources and support systems, Assess for adequacy in community support network, Educate family and significant other(s) on suicide prevention, Complete Psychosocial Assessment, Interpersonal group therapy.  Evaluation of Outcomes: Progressing   Progress in Treatment: Attending groups: Yes. Participating in groups: Yes. Taking medication as prescribed: Yes. Toleration medication: Yes. Family/Significant other contact made: Yes, individual(s) contacted:  mother, Marquette Saa. Patient understands diagnosis: Yes. Discussing patient identified problems/goals with staff: Yes. Medical problems stabilized or resolved: Yes. Denies suicidal/homicidal ideation: Yes. Issues/concerns per patient self-inventory: No. Other:  none.   New problem(s) identified: No, Describe:  none   New Short Term/Long Term Goal(s): detox, elimination of symptoms of psychosis, medication management for mood stabilization; elimination of SI thoughts; development of comprehensive mental wellness/sobriety plan. Update 03/21/24: No changes at this time. Update 03/26/24: No changes at this time.   Patient Goals:  "improve my mental health, my anxiety and my depression"  Update 03/21/24: No changes at this time. Update 03/26/24: No changes at this time.   Discharge Plan or Barriers: Patient that he will need housing assistance.  Patient will also need a wound and or surgical consult in regards to his hand.  Patient is agreeable to referral for mental health treatment at discharge. Update 03/21/24: After discharge the patient will go to [facility] at [place]. Update 03/26/24: No changes at this time.   Reason for Continuation of Hospitalization: Anxiety Depression Homicidal ideation Medication stabilization   Estimated Length of Stay:  1-7 days  Update 03/21/24: TBD Update 03/26/24: TBD.  Last 3 [place] Suicide Severity Risk Score: Flowsheet Row Admission (Current) from 03/14/2024 in [facility] INPATIENT BEH[facility]AL MEDICINE Most recent reading at 03/14/2024  3:00 PM ED from 03/12/2024 in [facility] Emergency Department at [facility] Most recent reading at 03/12/2024 11:07 PM ED from 03/12/2024 in [facility] Most recent reading at 03/12/2024  2:15 PM  C-SSRS RISK CATEGORY No Risk No Risk No Risk       Last PHQ 2/9 Scores:    03/08/2023   10:52 AM 11/07/2022    2:03 PM  Depression screen PHQ 2/9  Decreased Interest 0 0  Down, Depressed, Hopeless 0 0  PHQ - 2 Score 0 0    Scribe for Treatment Team: Glenis Smoker, LCSW 03/26/2024 3:07 PM

## 2024-03-26 NOTE — Group Note (Signed)
 Recreation Therapy Group Note   Group Topic:Health and Wellness  Group Date: 03/26/2024 Start Time: 1530 End Time: 1645 Facilitators: Rosina Lowenstein, LRT, CTRS Location: Courtyard  Group Description: [employer]. LRT and patients played games of basketball, drew with chalk, and played corn hole while outside in the courtyard while getting fresh air and sunlight. Music was being played in the background. LRT and peers conversed about different games they have played before, what they do in their free time and anything else that is on their minds. LRT encouraged pts to drink water after being outside, sweating and getting their heart rate up.  Goal Area(s) Addressed: Patient will build on frustration tolerance skills. Patients will partake in a competitive play game with peers. Patients will gain knowledge of new leisure interest/hobby.    Affect/Mood: Appropriate   Participation Level: Active   Participation Quality: Independent   Behavior: Appropriate   Speech/Thought Process: Coherent   Insight: Fair   Judgement: Fair    Modes of Intervention: Activity   Patient Response to Interventions:  Receptive   Education Outcome:  Acknowledges education   Clinical Observations/Individualized Feedback: Mark Galloway was active in their participation of session activities and group discussion. Pt interacted well with LRT and peers duration of session.    Plan: Continue to engage patient in RT group sessions 2-3x/week.   Rosina Lowenstein, LRT, CTRS 03/26/2024 5:21 PM

## 2024-03-26 NOTE — Plan of Care (Signed)
  Problem: Education: Goal: Knowledge of [place] [facility] Education information/materials will improve 03/26/2024 1805 by Jovita Gamma, RN Outcome: Adequate for Discharge 03/26/2024 1346 by Jovita Gamma, RN Outcome: Progressing Goal: Emotional status will improve 03/26/2024 1805 by Jovita Gamma, RN Outcome: Adequate for Discharge 03/26/2024 1346 by Jovita Gamma, RN Outcome: Progressing Goal: Mental status will improve 03/26/2024 1805 by Jovita Gamma, RN Outcome: Adequate for Discharge 03/26/2024 1346 by Jovita Gamma, RN Outcome: Progressing Goal: Verbalization of understanding the information provided will improve Outcome: Adequate for Discharge   Problem: Education: Goal: Emotional status will improve 03/26/2024 1805 by Jovita Gamma, RN Outcome: Adequate for Discharge 03/26/2024 1346 by Jovita Gamma, RN Outcome: Progressing   Problem: Education: Goal: Verbalization of understanding the information provided will improve Outcome: Adequate for Discharge   Problem: Activity: Goal: Interest or engagement in activities will improve 03/26/2024 1805 by Jovita Gamma, RN Outcome: Adequate for Discharge 03/26/2024 1346 by Jovita Gamma, RN Outcome: Progressing Goal: Sleeping patterns will improve Outcome: Adequate for Discharge

## 2024-03-27 ENCOUNTER — Other Ambulatory Visit: Payer: Self-pay

## 2024-03-27 ENCOUNTER — Ambulatory Visit: Payer: Self-pay | Admitting: Family

## 2024-03-27 DIAGNOSIS — F319 Bipolar disorder, unspecified: Secondary | ICD-10-CM | POA: Diagnosis not present

## 2024-03-27 LAB — GLUCOSE, CAPILLARY: Glucose-Capillary: 132 mg/dL — ABNORMAL HIGH (ref 70–99)

## 2024-03-27 NOTE — Plan of Care (Signed)
   Problem: Education: Goal: Emotional status will improve Outcome: Progressing Goal: Mental status will improve Outcome: Progressing   Problem: Activity: Goal: Sleeping patterns will improve Outcome: Progressing

## 2024-03-27 NOTE — Group Note (Signed)
 Date:  03/27/2024 Time:  8:58 PM  Group Topic/Focus:  Wrap-Up Group:   The focus of this group is to help patients review their daily goal of treatment and discuss progress on daily workbooks.    Participation Level:  Active  Participation Quality:  Appropriate  Affect:  Appropriate  Cognitive:  Appropriate  Insight: Appropriate  Engagement in Group:  Engaged  Modes of Intervention:  Support  Additional Comments:     Belva Crome 03/27/2024, 8:58 PM

## 2024-03-27 NOTE — BHH Counselor (Signed)
 CSW touched base with [facility]. They do not have a vacancy at this time.    CSW to continue to assess.    Reymundo Poll, MSW, LCSWA 03/27/2024 8:46 AM

## 2024-03-27 NOTE — Progress Notes (Signed)
   03/27/24 0957  Psych Admission Type (Psych Patients Only)  Admission Status Voluntary  Psychosocial Assessment  Patient Complaints None  Eye Contact Fair  Facial Expression Animated  Affect Appropriate to circumstance  Speech Logical/coherent  Interaction Assertive  Motor Activity Slow  Appearance/Hygiene Unremarkable  Behavior Characteristics Calm  Mood Anxious  Thought Process  Coherency WDL  Content WDL  Delusions None reported or observed  Perception WDL  Hallucination None reported or observed  Judgment WDL  Confusion None  Danger to Self  Current suicidal ideation? Denies  Self-Injurious Behavior No self-injurious ideation or behavior indicators observed or expressed   Agreement Not to Harm Self No  Description of Agreement Verbal  Danger to Others  Danger to Others None reported or observed

## 2024-03-27 NOTE — BHH Counselor (Signed)
 CSW team spoke with [place] at [facility].  CSW informed of patient's MRSA and hand concerns.  Almira Coaster reports that the [facility] will have to review the patient to determine if he can be accepted with his medical concerns.  CSW team confirmed that [employer] staff will be in over weekend and CSW team can call Almira Coaster to find out final determination.   Penni Homans, MSW, LCSW 03/27/2024 5:15 PM

## 2024-03-27 NOTE — Plan of Care (Signed)
   Problem: Education: Goal: Knowledge of [place] [facility] Education information/materials will improve Outcome: Progressing Goal: Emotional status will improve Outcome: Progressing Goal: Mental status will improve Outcome: Progressing

## 2024-03-27 NOTE — Group Note (Signed)
 Recreation Therapy Group Note   Group Topic:Leisure Education  Group Date: 03/27/2024 Start Time: 1015 End Time: 1115 Facilitators: Clinton Gallant, CTRS Location:  Craft Room  Group Description: Leisure. Patients were given the option to choose from singing karaoke, coloring mandalas, using oil pastels, journaling, or playing with play-doh. LRT and pts discussed the meaning of leisure, the importance of participating in leisure during their free time/when they're outside of the hospital, as well as how our leisure interests can also serve as coping skills.   Goal Area(s) Addressed:  Patient will identify a current leisure interest.  Patient will learn the definition of "leisure". Patient will practice making a positive decision. Patient will have the opportunity to try a new leisure activity. Patient will communicate with peers and LRT.    Affect/Mood: Appropriate   Participation Level: Active and Engaged   Participation Quality: Independent   Behavior: Appropriate, Calm, and Cooperative   Speech/Thought Process: Coherent   Insight: Good   Judgement: Good   Modes of Intervention: Activity, Exploration, and Music   Patient Response to Interventions:  Attentive, Engaged, Interested , and Receptive   Education Outcome:  Acknowledges education   Clinical Observations/Individualized Feedback: Mark Galloway was active in their participation of session activities and group discussion. Pt identified "eat vegetables and exercise" as things he does in his free time. Pt chose to sing karaoke while in group. Pt interacted well with LRT and peers duration of session.    Plan: Continue to engage patient in RT group sessions 2-3x/week.   Mark Galloway, LRT, CTRS 03/27/2024 11:35 AM

## 2024-03-27 NOTE — [facility] Counselor (Signed)
 CSW touched base with [facility] and inpatient programs on the patient's behalf.   [facility] for an interview at 8 PM and [employer] information given to patient.   Patient conducting phone screening for [facility].    Reymundo Poll, MSW, LCSWA 03/27/2024 3:40 PM

## 2024-03-27 NOTE — Progress Notes (Signed)
 [facility] MD Progress Note  03/27/2024 11:23 AM Mark Galloway  MRN:  [id number]  38 year old never married African-American male with reported history of bipolar disorder, stimulant use disorder, medical history of AIDS, syphilis, cellulitis of right fifth digit with recent debridement, presented to [facility] on 3/13 accompanied by his mother, with chief complaint of depression and anxiety, thoughts of wanting to hurt manager at [employer] weeks the day prior to presenting, lack of sleep, homelessness, abnormal behaviors such as going into people's homes uninvited as well as their vehicles.    Subjective:   Patient's case discussed with multidisciplinary team, all vitals and notes were reviewed.  No reported behavioral issues overnight.  Patient is seen for reassessment, he continues to do well, psychiatrically stable for discharge. Denies any psychiatric sxs. Denies SI/HI and AVH. No other concerns. Reports that he is ready to be discharged, states that he would like to go to [facility]. SW team continues to work on placement, currently no beds available at local shelters    Patient has been denied by multiple long-term inpatient rehab facilities, which is the recommended disposition from a psychiatric perspective, due to medical comorbidities, wounds.  Social work team will attempt to possibly get him into a shelter, however this may not be possible.  Disposition continues to be complicated. Infectious disease team has expressed their concern that the patient may not be able to appropriately care for wounds, maintain adherence to the required oral antibiotic therapy.  Multiple discussions with hospitalist, infectious disease, has determined that patient does not meet criteria for admission to inpatient medical unit.  Recommendations for the patient to follow up with outpatient orthopedic surgeon, infectious disease, PCP, continue oral antibiotics as prescribed, wound care as instructed.  Have  also contacted wound care specialist, requesting need for wound care education to ensure that the patient is able to care for his wounds.  I have also requested that the patient be provided with supplies for wound care prior to his discharge.  Principal Problem: Bipolar 1 disorder ([name]) Diagnosis: Principal Problem:   Bipolar 1 disorder ([name]) Active Problems:   AIDS ([name])   Paranoid schizophrenia ([name])   Finger osteomyelitis ([name])   Cellulitis of foot, left   HTN (hypertension)   MRSA infection   Tenosynovitis of finger  Total Time spent with patient: 1 hr   Past Psychiatric History:  DX: Per chart review-bipolar disorder with psychotic features Outpatient provider: None Current caregiver:  Patient is own guardian/ care giver Past hospitalizations: [facility] 2021 for mania and psychosis, patient reports in 2024 at a facility in [place] x 3 weeks Medication trials : Zyprexa, per chart review-lithium Suicide attempts: Patient denies Patient denies ever having an Act/CST team. Denies ECT, Clozaril treatments.  Past Medical History:  Past Medical History:  Diagnosis Date   Bipolar 1 disorder ([name])    HIV infection ([name])    Schizophrenia ([name])    Shingles     Past Surgical History:  Procedure Laterality Date   I & D EXTREMITY Right 02/22/2024   Procedure: IRRIGATION AND DEBRIDEMENT EXTREMITY;  Surgeon: Betha Loa, MD;  Location: [facility] OR;  Service: Orthopedics;  Laterality: Right;   IRRIGATION AND DEBRIDEMENT FOOT Left    Family History: History reviewed. No pertinent family history. Family Psychiatric  History:  Patient reports maternal grandmother-schizophrenia Patient denies any family history of suicide, substance use disorder  Social History:  Social History   Substance and Sexual Activity  Alcohol Use Yes   Comment:  3-4 per week     Social History   Substance and Sexual Activity  Drug Use Never    Social History   Socioeconomic History   Marital status: Single     Spouse name: Not on file   Number of children: 0   Years of education: 16   Highest education level: Not on file  Occupational History   Occupation: Flight Service  Tobacco Use   Smoking status: Some Days    Types: Cigars, Cigarettes   Smokeless tobacco: Never   Tobacco comments:    2 per week  Vaping Use   Vaping status: Never Used  Substance and Sexual Activity   Alcohol use: Yes    Comment: 3-4 per week   Drug use: Never   Sexual activity: Not Currently    Comment: accepted condoms  Other Topics Concern   Not on file  Social History Narrative   Not on file   Social Drivers of Health   Financial Resource Strain: High Risk (01/04/2024)   Received from [facility]   Overall Financial Resource Strain (CARDIA)    Difficulty of Paying Living Expenses: Very hard  Food Insecurity: Food Insecurity Present (03/14/2024)   Hunger Vital Sign    Worried About Running Out of Food in the Last Year: Sometimes true    Ran Out of Food in the Last Year: Sometimes true  Transportation Needs: No Transportation Needs (03/14/2024)   PRAPARE - Administrator, Civil Service (Medical): No    Lack of Transportation (Non-Medical): No  Recent Concern: Transportation Needs - Unmet Transportation Needs (02/23/2024)   PRAPARE - Administrator, Civil Service (Medical): Yes    Lack of Transportation (Non-Medical): Yes  Physical Activity: Not on file  Stress: Not on file  Social Connections: Unknown (05/04/2022)   Received from [facility]   Social Network    Social Network: Not on file   Additional Social History:                         Sleep: Good  Appetite:  Good  Current Medications: Current Facility-Administered Medications  Medication Dose Route Frequency Provider Last Rate Last Admin   acetaminophen (TYLENOL) tablet 650 mg  650 mg Oral Q6H PRN Lorretta Harp, MD   650 mg at 03/26/24 1057   alum & mag hydroxide-simeth (MAALOX/MYLANTA)  200-200-20 MG/5ML suspension 30 mL  30 mL Oral Q4H PRN Amante Fomby, PA-C       ascorbic acid (VITAMIN C) tablet 1,000 mg  1,000 mg Oral Daily Lamyra Malcolm, PA-C   1,000 mg at 03/27/24 0825   bictegravir-emtricitabine-tenofovir AF (BIKTARVY) 50-200-25 MG per tablet 1 tablet  1 tablet Oral Daily Linzy Darling, PA-C   1 tablet at 03/27/24 0825   haloperidol (HALDOL) tablet 5 mg  5 mg Oral TID PRN Rodgerick Gilliand, PA-C   5 mg at 03/23/24 1743   And   diphenhydrAMINE (BENADRYL) capsule 50 mg  50 mg Oral TID PRN Rhilyn Battle, PA-C   50 mg at 03/23/24 1743   haloperidol lactate (HALDOL) injection 5 mg  5 mg Intramuscular TID PRN Lewi Drost, PA-C       And   diphenhydrAMINE (BENADRYL) injection 50 mg  50 mg Intramuscular TID PRN Jenina Moening, PA-C       And   LORazepam (ATIVAN) injection 2 mg  2 mg Intramuscular TID PRN Lucely Leard, Judeth Cornfield, PA-C  haloperidol lactate (HALDOL) injection 10 mg  10 mg Intramuscular TID PRN Francyne Arreaga, PA-C       And   diphenhydrAMINE (BENADRYL) injection 50 mg  50 mg Intramuscular TID PRN Roney Youtz, PA-C       And   LORazepam (ATIVAN) injection 2 mg  2 mg Intramuscular TID PRN Heywood Tokunaga, PA-C   2 mg at 03/23/24 1743   docusate sodium (COLACE) capsule 100 mg  100 mg Oral BID Emre Stock, PA-C   100 mg at 03/27/24 0825   hydrALAZINE (APRESOLINE) tablet 50 mg  50 mg Oral Q2H PRN Lorretta Harp, MD       hydrOXYzine (ATARAX) tablet 25 mg  25 mg Oral TID PRN Fatime Biswell, PA-C   25 mg at 03/26/24 1754   ibuprofen (ADVIL) tablet 800 mg  800 mg Oral Q6H PRN Nanna Ertle, PA-C   800 mg at 03/26/24 2143   linezolid (ZYVOX) tablet 600 mg  600 mg Oral Q12H Lynn Ito, MD   600 mg at 03/27/24 0930   magnesium hydroxide (MILK OF MAGNESIA) suspension 30 mL  30 mL Oral Daily PRN Yukari Flax, PA-C       melatonin tablet 5 mg  5 mg Oral QHS Zorion Nims, PA-C   5  mg at 03/26/24 2142   nicotine (NICODERM CQ - dosed in mg/24 hours) patch 21 mg  21 mg Transdermal Daily Lorretta Harp, MD   21 mg at 03/24/24 0741   nystatin (MYCOSTATIN) 100000 UNIT/ML suspension 500,000 Units  5 mL Oral QID Walker Paddack, PA-C   500,000 Units at 03/27/24 1333   OLANZapine zydis (ZYPREXA) disintegrating tablet 15 mg  15 mg Oral BID Gentri Guardado, PA-C   15 mg at 03/27/24 0825   propranolol (INDERAL) tablet 10 mg  10 mg Oral BID Myriam Forehand, NP   10 mg at 03/27/24 1610    Lab Results:  Results for orders placed or performed during the hospital encounter of 03/14/24 (from the past 48 hours)  Glucose, capillary     Status: Abnormal   Collection Time: 03/25/24  5:05 PM  Result Value Ref Range   Glucose-Capillary 132 (H) 70 - 99 mg/dL    Comment: Glucose reference range applies only to samples taken after fasting for at least 8 hours.  Glucose, capillary     Status: Abnormal   Collection Time: 03/25/24  7:55 PM  Result Value Ref Range   Glucose-Capillary 111 (H) 70 - 99 mg/dL    Comment: Glucose reference range applies only to samples taken after fasting for at least 8 hours.  CBC with Differential/Platelet     Status: Abnormal   Collection Time: 03/26/24  6:31 AM  Result Value Ref Range   WBC 4.5 4.0 - 10.5 K/uL   RBC 3.40 (L) 4.22 - 5.81 MIL/uL   Hemoglobin 11.0 (L) 13.0 - 17.0 g/dL   HCT 96.0 (L) 45.4 - 09.8 %   MCV 97.1 80.0 - 100.0 fL   MCH 32.4 26.0 - 34.0 pg   MCHC 33.3 30.0 - 36.0 g/dL   RDW 11.9 14.7 - 82.9 %   Platelets 362 150 - 400 K/uL   nRBC 0.0 0.0 - 0.2 %   Neutrophils Relative % 39 %   Neutro Abs 1.7 1.7 - 7.7 K/uL   Lymphocytes Relative 37 %   Lymphs Abs 1.6 0.7 - 4.0 K/uL   Monocytes Relative 15 %   Monocytes Absolute 0.7 0.1 - 1.0 K/uL  Eosinophils Relative 9 %   Eosinophils Absolute 0.4 0.0 - 0.5 K/uL   Basophils Relative 0 %   Basophils Absolute 0.0 0.0 - 0.1 K/uL   Immature Granulocytes 0 %   Abs Immature Granulocytes 0.02  0.00 - 0.07 K/uL    Comment: Performed at [facility], [address] [place], [place] [address]  Glucose, capillary     Status: Abnormal   Collection Time: 03/26/24  8:02 PM  Result Value Ref Range   Glucose-Capillary 110 (H) 70 - 99 mg/dL    Comment: Glucose reference range applies only to samples taken after fasting for at least 8 hours.  Glucose, capillary     Status: Abnormal   Collection Time: 03/27/24  8:20 AM  Result Value Ref Range   Glucose-Capillary 132 (H) 70 - 99 mg/dL    Comment: Glucose reference range applies only to samples taken after fasting for at least 8 hours.    Blood Alcohol level:  Lab Results  Component Value Date   ETH <10 10/01/2022   ETH <10 03/25/2020    Metabolic Disorder Labs: Lab Results  Component Value Date   HGBA1C 5.1 03/15/2024   MPG 99.67 03/15/2024   No results found for: "PROLACTIN" Lab Results  Component Value Date   CHOL 108 03/15/2024   TRIG 106 03/15/2024   HDL 39 (L) 03/15/2024   CHOLHDL 2.8 03/15/2024   VLDL 21 03/15/2024   LDLCALC 48 03/15/2024   LDLCALC 40 11/07/2022    Physical Findings: AIMS:  , ,  ,  ,    CIWA:    COWS:     Musculoskeletal: Strength & Muscle Tone: within normal limits Gait & Station: normal Patient leans: N/A  Psychiatric Specialty Exam:  Presentation  General Appearance:  Disheveled  Eye Contact: Good  Speech: Clear and Coherent; Normal Rate  Speech Volume: Normal  Handedness: Right   Mood and Affect  Mood: Euthymic  Affect: Appropriate   Thought Process  Thought Processes: Coherent; Goal Directed; Linear  Descriptions of Associations:Intact  Orientation:Full (Time, Place and Person)  Thought Content:Logical  History of Schizophrenia/Schizoaffective disorder:No  Duration of Psychotic Symptoms:N/A  Hallucinations:No data recorded   Ideas of Reference:None  Suicidal Thoughts:No data recorded   Homicidal Thoughts:No data  recorded    Sensorium  Memory: Immediate Good; Recent Good; Remote Good  Judgment: Fair  Insight: Fair   Art therapist  Concentration: Good  Attention Span: Good  Recall: Good  Fund of Knowledge: Fair  Language: Fair   Psychomotor Activity  Psychomotor Activity: No data recorded    Assets  Assets: Communication Skills; Desire for Improvement; Social Support   Sleep  Sleep: No data recorded     Physical Exam: Physical Exam Vitals and nursing note reviewed.  Constitutional:      Appearance: Normal appearance.  HENT:     Head: Normocephalic and atraumatic.  Pulmonary:     Effort: Pulmonary effort is normal.  Musculoskeletal:     Cervical back: Normal range of motion.     Left lower leg: Edema present.  Skin:    General: Skin is dry.     Comments: cellulitis of the left foot and osteomyelitis of the right little finger,   Neurological:     Mental Status: He is alert and oriented to person, place, and time. Mental status is at baseline.  Psychiatric:        Attention and Perception: Attention and perception normal.        Mood  and Affect: Mood and affect normal.        Speech: Speech normal.        Behavior: Behavior normal. Behavior is cooperative.        Thought Content: Thought content normal.        Cognition and Memory: Cognition and memory normal.        Judgment: Judgment is impulsive.    Review of Systems  Skin:        cellulitis of the left foot and osteomyelitis of the right little finger,   Psychiatric/Behavioral:  Positive for substance abuse.   All other systems reviewed and are negative.  Blood pressure 133/76, pulse 94, temperature 97.9 F (36.6 C), resp. rate 16, height 5\' 9"  (1.753 m), weight 72.1 kg, SpO2 100%. Body mass index is 23.48 kg/m.   Treatment Plan Summary:  1.    Safety and Monitoring:   --  Voluntary admission to inpatient psychiatric unit for safety, stabilization and treatment -- Daily  contact with patient to assess and evaluate symptoms and progress in treatment -- Patient's case to be discussed in multi-disciplinary team meeting -- Observation Level : q15 minute checks -- Vital signs:  q12 hours -- Precautions: suicide   2. Psychiatric Diagnoses and Treatment:  03/27/2024 -- Continue Zyprexa 15 mg twice daily for mood stabilization/psychosis -- Continue hydroxyzine 25 mg TID PRN -- Continue melatonin 5 mg HS for sleep aid   03/26/2024 -- Continue Zyprexa 15 mg twice daily for mood stabilization/psychosis -- Continue hydroxyzine 25 mg TID PRN -- Continue melatonin 5 mg HS for sleep aid   03/25/2024 -- Continue Zyprexa 15 mg twice daily for mood stabilization/psychosis -- Continue hydroxyzine 25 mg TID PRN -- Continue melatonin 5 mg HS for sleep aid  -- Will need to discuss with team, to explore options of long-term inpatient substance use treatment, otherwise patient may have to be discharged to a shelter with proper wound education, supplies, provided oral antibiotics at discharge.  May also explore discussing with infectious disease and hospitalists possibility of admitting to medical unit for further medical stabilization once psychiatrically cleared.  03/24/2024 -- Agrees Zyprexa 10 mg twice daily to 15 mg twice daily for mood stabilization/psychosis -- Continue hydroxyzine 25 mg TID PRN -- Continue melatonin 5 mg HS for sleep aid  -- Will need to discuss with team, to explore options of long-term inpatient substance use treatment, otherwise patient may have to be discharged to a shelter with proper wound education, supplies, provided oral antibiotics at discharge.  May also explore discussing with infectious disease and hospitalists possibility of admitting to medical unit for further medical stabilization once psychiatrically cleared.   03/23/2024 -- Continue Zyprexa 10 mg HS for mood/psychosis -- Continue hydroxyzine 25 mg TID PRN -- Start melatonin 5 mg HS for  sleep aid  -- Will need to discuss with team, have family meeting to discuss safe dispo  03/15/2024 -- Continue Zyprexa 5 mg BID for mood/psychosis -- Continue hydroxyzine 25 mg TID PRN -- Start melatonin 5 mg HS for sleep aid  -- Labs ordered for HbA1c and lipid panel    --  The risks/benefits/side-effects/alternatives to this medication were discussed in detail with the patient and time was given for questions. The patient consents to medication trial. -- Metabolic profile and EKG monitoring obtained while on an atypical antipsychotic  -- Encouraged patient to participate in unit milieu and in scheduled group therapies -- Short Term Goals: Ability to identify changes in lifestyle to reduce  recurrence of condition will improve, Ability to verbalize feelings will improve, Ability to disclose and discuss suicidal ideas, Ability to demonstrate self-control will improve, Ability to identify and develop effective coping behaviors will improve, Ability to maintain clinical measurements within normal limits will improve, Compliance with prescribed medications will improve, and Ability to identify triggers associated with substance abuse/mental health issues will improve -- Long Term Goals: Improvement in symptoms so as ready for discharge        3. Medical Issues Being Addressed:               Hx of syphilis             -- labs for RPR, ID consult   -- no need for further treatment per chart review, RPR 1:32               AIDS             -- Continue home med Biktarvy 1 tab daily             -- ID consult - Labs for HIV 1 RNA, CD4 count   -- chart reviewed, recommend continue home med                Osteomyelitis  of R. Fifth digit             -- Per chart review underwent operative debridement 2/22 and saw hand surgery again on 3/ 5. Is followed by Dr. Lucianne Muss orthopedics             -- Wound care consult -- 3/24 chart review - Is being followed by Hospitalist, ID.  Cultures positive for MRSA,  E. coli and strep.  MRI of fingers pending.  Per ID, D/c Augmentin today 3/24 , continue Zyvox (linezolid) 600 mg BID for 2-4 weeks, CBC once weekly while on Zyvox.  Recommendations by hospitalist to follow up with ortho.   Left foot cellulitis   -- first metatarsal phalangeal joint area with blister   -- seen by Podiatry: I&D performed, wound cultures obtained, treated with Betadine, replaced for postoperative as well as daily dressing changes   Hyperglycemia   -- Sliding scale insulin initiated to manage hyperglycemia (possibly stress- or medication-induced).      4. Discharge Planning:   -- Social work and case management to assist with discharge planning and identification of hospital follow-up needs prior to discharge -- Estimated LOS: 5-7 days -- Discharge Concerns: Need to establish a safety plan; Medication compliance and effectiveness -- Discharge Goals: Return home with outpatient referrals for mental health follow-up including medication management/psychotherapy  Paulene Floor, PA-C 03/27/2024, 2:27 PM

## 2024-03-27 NOTE — Group Note (Signed)
 Recreation Therapy Group Note   Group Topic:Other  Group Date: 03/27/2024 Start Time: 1500 End Time: 1600 Facilitators: Rosina Lowenstein, LRT, CTRS Location: Courtyard  Group Description: [employer]. LRT and patients played games of basketball, drew with chalk, and played corn hole while outside in the courtyard while getting fresh air and sunlight. Music was being played in the background. LRT and peers conversed about different games they have played before, what they do in their free time and anything else that is on their minds. LRT encouraged pts to drink water after being outside, sweating and getting their heart rate up.  Goal Area(s) Addressed: Patient will build on frustration tolerance skills. Patients will partake in a competitive play game with peers. Patients will gain knowledge of new leisure interest/hobby.    Affect/Mood: Appropriate   Participation Level: Active   Participation Quality: Independent   Behavior: Appropriate   Speech/Thought Process: Coherent   Insight: Fair   Judgement: Fair    Modes of Intervention: Activity   Patient Response to Interventions:  Receptive   Education Outcome:  Acknowledges education   Clinical Observations/Individualized Feedback: [place] was active in their participation of session activities and group discussion. Pt interacted well with LRT and peers duration of session.    Plan: Continue to engage patient in RT group sessions 2-3x/week.   Rosina Lowenstein, LRT, CTRS 03/27/2024 4:26 PM

## 2024-03-28 DIAGNOSIS — F319 Bipolar disorder, unspecified: Secondary | ICD-10-CM | POA: Diagnosis not present

## 2024-03-28 LAB — AEROBIC/ANAEROBIC CULTURE W GRAM STAIN (SURGICAL/DEEP WOUND): Gram Stain: NONE SEEN

## 2024-03-28 NOTE — Progress Notes (Signed)
 [facility] MD Progress Note  03/28/2024 11:23 AM Mark Galloway  MRN:  [id number]  38 year old never married African-American male with reported history of bipolar disorder, stimulant use disorder, medical history of AIDS, syphilis, cellulitis of right fifth digit with recent debridement, presented to [facility] on 3/13 accompanied by his mother, with chief complaint of depression and anxiety, thoughts of wanting to hurt manager at [employer] weeks the day prior to presenting, lack of sleep, homelessness, abnormal behaviors such as going into people's homes uninvited as well as their vehicles.    Subjective:   Patient's case discussed with multidisciplinary team, all vitals and notes were reviewed.  No reported behavioral issues overnight.  Patient is seen for reassessment, he remains psychiatrically stable for discharge.  States he is doing " pretty good".  Denies any psychiatric symptoms.  He is med compliant.  Per social work team, he has an appointment scheduled with his orthopedic surgeon for Monday, pt will be discharged to his appointment at that time.  Principal Problem: Bipolar 1 disorder ([name]) Diagnosis: Principal Problem:   Bipolar 1 disorder ([name]) Active Problems:   AIDS ([name])   Paranoid schizophrenia ([name])   Finger osteomyelitis ([name])   Cellulitis of foot, left   HTN (hypertension)   MRSA infection   Tenosynovitis of finger  Total Time spent with patient: 1 hr   Past Psychiatric History:  DX: Per chart review-bipolar disorder with psychotic features Outpatient provider: None Current caregiver:  Patient is own guardian/ care giver Past hospitalizations: [facility] 2021 for mania and psychosis, patient reports in 2024 at a facility in [place] x 3 weeks Medication trials : Zyprexa, per chart review-lithium Suicide attempts: Patient denies Patient denies ever having an Act/CST team. Denies ECT, Clozaril treatments.  Past Medical History:  Past Medical History:  Diagnosis Date   Bipolar  1 disorder ([name])    HIV infection ([name])    Schizophrenia ([name])    Shingles     Past Surgical History:  Procedure Laterality Date   I & D EXTREMITY Right 02/22/2024   Procedure: IRRIGATION AND DEBRIDEMENT EXTREMITY;  Surgeon: Betha Loa, MD;  Location: [facility] OR;  Service: Orthopedics;  Laterality: Right;   IRRIGATION AND DEBRIDEMENT FOOT Left    Family History: History reviewed. No pertinent family history. Family Psychiatric  History:  Patient reports maternal grandmother-schizophrenia Patient denies any family history of suicide, substance use disorder  Social History:  Social History   Substance and Sexual Activity  Alcohol Use Yes   Comment: 3-4 per week     Social History   Substance and Sexual Activity  Drug Use Never    Social History   Socioeconomic History   Marital status: Single    Spouse name: Not on file   Number of children: 0   Years of education: 16   Highest education level: Not on file  Occupational History   Occupation: Public relations account executive  Tobacco Use   Smoking status: Some Days    Types: Cigars, Cigarettes   Smokeless tobacco: Never   Tobacco comments:    2 per week  Vaping Use   Vaping status: Never Used  Substance and Sexual Activity   Alcohol use: Yes    Comment: 3-4 per week   Drug use: Never   Sexual activity: Not Currently    Comment: accepted condoms  Other Topics Concern   Not on file  Social History Narrative   Not on file   Social Drivers of Health   Financial Resource Strain:  High Risk (01/04/2024)   Received from [facility]   Overall Financial Resource Strain (CARDIA)    Difficulty of Paying Living Expenses: Very hard  Food Insecurity: Food Insecurity Present (03/14/2024)   Hunger Vital Sign    Worried About Running Out of Food in the Last Year: Sometimes true    Ran Out of Food in the Last Year: Sometimes true  Transportation Needs: No Transportation Needs (03/14/2024)   PRAPARE - Scientist, research (physical sciences) (Medical): No    Lack of Transportation (Non-Medical): No  Recent Concern: Transportation Needs - Unmet Transportation Needs (02/23/2024)   PRAPARE - Administrator, Civil Service (Medical): Yes    Lack of Transportation (Non-Medical): Yes  Physical Activity: Not on file  Stress: Not on file  Social Connections: Unknown (05/04/2022)   Received from [facility]   Social Network    Social Network: Not on file   Additional Social History:                         Sleep: Good  Appetite:  Good  Current Medications: Current Facility-Administered Medications  Medication Dose Route Frequency Provider Last Rate Last Admin   acetaminophen (TYLENOL) tablet 650 mg  650 mg Oral Q6H PRN Lorretta Harp, MD   650 mg at 03/26/24 1057   alum & mag hydroxide-simeth (MAALOX/MYLANTA) 200-200-20 MG/5ML suspension 30 mL  30 mL Oral Q4H PRN Nusaybah Ivie, PA-C       ascorbic acid (VITAMIN C) tablet 1,000 mg  1,000 mg Oral Daily Laron Angelini, PA-C   1,000 mg at 03/27/24 0825   bictegravir-emtricitabine-tenofovir AF (BIKTARVY) 50-200-25 MG per tablet 1 tablet  1 tablet Oral Daily Nasiah Lehenbauer, PA-C   1 tablet at 03/27/24 0825   haloperidol (HALDOL) tablet 5 mg  5 mg Oral TID PRN Billiejean Schimek, PA-C   5 mg at 03/27/24 1535   And   diphenhydrAMINE (BENADRYL) capsule 50 mg  50 mg Oral TID PRN Ahmari Garton, PA-C   50 mg at 03/27/24 1535   haloperidol lactate (HALDOL) injection 5 mg  5 mg Intramuscular TID PRN Canisha Issac, PA-C       And   diphenhydrAMINE (BENADRYL) injection 50 mg  50 mg Intramuscular TID PRN Yanely Mast, PA-C       And   LORazepam (ATIVAN) injection 2 mg  2 mg Intramuscular TID PRN Kenna Kirn, PA-C       haloperidol lactate (HALDOL) injection 10 mg  10 mg Intramuscular TID PRN Betzayda Braxton, PA-C       And   diphenhydrAMINE (BENADRYL) injection 50 mg  50 mg Intramuscular TID PRN  Zidane Renner, PA-C       And   LORazepam (ATIVAN) injection 2 mg  2 mg Intramuscular TID PRN Ty Oshima, PA-C   2 mg at 03/23/24 1743   docusate sodium (COLACE) capsule 100 mg  100 mg Oral BID Haile Bosler, PA-C   100 mg at 03/27/24 1620   hydrALAZINE (APRESOLINE) tablet 50 mg  50 mg Oral Q2H PRN Lorretta Harp, MD       hydrOXYzine (ATARAX) tablet 25 mg  25 mg Oral TID PRN Senaya Dicenso, PA-C   25 mg at 03/26/24 1754   ibuprofen (ADVIL) tablet 800 mg  800 mg Oral Q6H PRN Trelyn Vanderlinde, PA-C   800 mg at 03/26/24 2143   linezolid (ZYVOX) tablet 600 mg  600 mg Oral Q12H  Lynn Ito, MD   600 mg at 03/27/24 2049   magnesium hydroxide (MILK OF MAGNESIA) suspension 30 mL  30 mL Oral Daily PRN Breuna Loveall, PA-C       melatonin tablet 5 mg  5 mg Oral QHS Merissa Renwick, PA-C   5 mg at 03/27/24 2049   nicotine (NICODERM CQ - dosed in mg/24 hours) patch 21 mg  21 mg Transdermal Daily Lorretta Harp, MD   21 mg at 03/24/24 0741   nystatin (MYCOSTATIN) 100000 UNIT/ML suspension 500,000 Units  5 mL Oral QID Sumayyah Custodio, PA-C   500,000 Units at 03/27/24 2049   OLANZapine zydis (ZYPREXA) disintegrating tablet 15 mg  15 mg Oral BID Jaimeson Gopal, PA-C   15 mg at 03/27/24 2049   propranolol (INDERAL) tablet 10 mg  10 mg Oral BID Myriam Forehand, NP   10 mg at 03/27/24 1620    Lab Results:  Results for orders placed or performed during the hospital encounter of 03/14/24 (from the past 48 hours)  Glucose, capillary     Status: Abnormal   Collection Time: 03/26/24  8:02 PM  Result Value Ref Range   Glucose-Capillary 110 (H) 70 - 99 mg/dL    Comment: Glucose reference range applies only to samples taken after fasting for at least 8 hours.  Glucose, capillary     Status: Abnormal   Collection Time: 03/27/24  8:20 AM  Result Value Ref Range   Glucose-Capillary 132 (H) 70 - 99 mg/dL    Comment: Glucose reference range applies only to samples taken  after fasting for at least 8 hours.    Blood Alcohol level:  Lab Results  Component Value Date   ETH <10 10/01/2022   ETH <10 03/25/2020    Metabolic Disorder Labs: Lab Results  Component Value Date   HGBA1C 5.1 03/15/2024   MPG 99.67 03/15/2024   No results found for: "PROLACTIN" Lab Results  Component Value Date   CHOL 108 03/15/2024   TRIG 106 03/15/2024   HDL 39 (L) 03/15/2024   CHOLHDL 2.8 03/15/2024   VLDL 21 03/15/2024   LDLCALC 48 03/15/2024   LDLCALC 40 11/07/2022    Physical Findings: AIMS:  , ,  ,  ,    CIWA:    COWS:     Musculoskeletal: Strength & Muscle Tone: within normal limits Gait & Station: normal Patient leans: N/A  Psychiatric Specialty Exam:  Presentation  General Appearance:  Disheveled  Eye Contact: Good  Speech: Clear and Coherent; Normal Rate  Speech Volume: Normal  Handedness: Right   Mood and Affect  Mood: Euthymic  Affect: Appropriate   Thought Process  Thought Processes: Coherent; Goal Directed; Linear  Descriptions of Associations:Intact  Orientation:Full (Time, Place and Person)  Thought Content:Logical  History of Schizophrenia/Schizoaffective disorder:No  Duration of Psychotic Symptoms:N/A  Hallucinations:No data recorded   Ideas of Reference:None  Suicidal Thoughts:No data recorded   Homicidal Thoughts:No data recorded    Sensorium  Memory: Immediate Good; Recent Good; Remote Good  Judgment: Fair  Insight: Fair   Art therapist  Concentration: Good  Attention Span: Good  Recall: Good  Fund of Knowledge: Fair  Language: Fair   Psychomotor Activity  Psychomotor Activity: No data recorded    Assets  Assets: Communication Skills; Desire for Improvement; Social Support   Sleep  Sleep: No data recorded     Physical Exam: Physical Exam Vitals and nursing note reviewed.  Constitutional:      Appearance: Normal appearance.  HENT:     Head:  Normocephalic and atraumatic.  Pulmonary:     Effort: Pulmonary effort is normal.  Musculoskeletal:     Cervical back: Normal range of motion.     Left lower leg: Edema present.  Skin:    General: Skin is dry.     Comments: cellulitis of the left foot and osteomyelitis of the right little finger,   Neurological:     Mental Status: He is alert and oriented to person, place, and time. Mental status is at baseline.  Psychiatric:        Attention and Perception: Attention and perception normal.        Mood and Affect: Mood and affect normal.        Speech: Speech normal.        Behavior: Behavior normal. Behavior is cooperative.        Thought Content: Thought content normal.        Cognition and Memory: Cognition and memory normal.        Judgment: Judgment is impulsive.    Review of Systems  Skin:        cellulitis of the left foot and osteomyelitis of the right little finger,   Psychiatric/Behavioral:  Positive for substance abuse.   All other systems reviewed and are negative.  Blood pressure 113/72, pulse 99, temperature 98.2 F (36.8 C), resp. rate 16, height 5\' 9"  (1.753 m), weight 72.1 kg, SpO2 100%. Body mass index is 23.48 kg/m.   Treatment Plan Summary:  1.    Safety and Monitoring:   --  Voluntary admission to inpatient psychiatric unit for safety, stabilization and treatment -- Daily contact with patient to assess and evaluate symptoms and progress in treatment -- Patient's case to be discussed in multi-disciplinary team meeting -- Observation Level : q15 minute checks -- Vital signs:  q12 hours -- Precautions: suicide   2. Psychiatric Diagnoses and Treatment:  03/28/2024 -- Continue Zyprexa 15 mg twice daily for mood stabilization/psychosis -- Continue hydroxyzine 25 mg TID PRN -- Continue melatonin 5 mg HS for sleep aid   03/27/2024 -- Continue Zyprexa 15 mg twice daily for mood stabilization/psychosis -- Continue hydroxyzine 25 mg TID PRN -- Continue  melatonin 5 mg HS for sleep aid   03/26/2024 -- Continue Zyprexa 15 mg twice daily for mood stabilization/psychosis -- Continue hydroxyzine 25 mg TID PRN -- Continue melatonin 5 mg HS for sleep aid   03/25/2024 -- Continue Zyprexa 15 mg twice daily for mood stabilization/psychosis -- Continue hydroxyzine 25 mg TID PRN -- Continue melatonin 5 mg HS for sleep aid  -- Will need to discuss with team, to explore options of long-term inpatient substance use treatment, otherwise patient may have to be discharged to a shelter with proper wound education, supplies, provided oral antibiotics at discharge.  May also explore discussing with infectious disease and hospitalists possibility of admitting to medical unit for further medical stabilization once psychiatrically cleared.  03/24/2024 -- Agrees Zyprexa 10 mg twice daily to 15 mg twice daily for mood stabilization/psychosis -- Continue hydroxyzine 25 mg TID PRN -- Continue melatonin 5 mg HS for sleep aid  -- Will need to discuss with team, to explore options of long-term inpatient substance use treatment, otherwise patient may have to be discharged to a shelter with proper wound education, supplies, provided oral antibiotics at discharge.  May also explore discussing with infectious disease and hospitalists possibility of admitting to medical unit for further medical stabilization once psychiatrically cleared.  03/23/2024 -- Continue Zyprexa 10 mg HS for mood/psychosis -- Continue hydroxyzine 25 mg TID PRN -- Start melatonin 5 mg HS for sleep aid  -- Will need to discuss with team, have family meeting to discuss safe dispo  03/15/2024 -- Continue Zyprexa 5 mg BID for mood/psychosis -- Continue hydroxyzine 25 mg TID PRN -- Start melatonin 5 mg HS for sleep aid  -- Labs ordered for HbA1c and lipid panel    --  The risks/benefits/side-effects/alternatives to this medication were discussed in detail with the patient and time was given for questions.  The patient consents to medication trial. -- Metabolic profile and EKG monitoring obtained while on an atypical antipsychotic  -- Encouraged patient to participate in unit milieu and in scheduled group therapies -- Short Term Goals: Ability to identify changes in lifestyle to reduce recurrence of condition will improve, Ability to verbalize feelings will improve, Ability to disclose and discuss suicidal ideas, Ability to demonstrate self-control will improve, Ability to identify and develop effective coping behaviors will improve, Ability to maintain clinical measurements within normal limits will improve, Compliance with prescribed medications will improve, and Ability to identify triggers associated with substance abuse/mental health issues will improve -- Long Term Goals: Improvement in symptoms so as ready for discharge        3. Medical Issues Being Addressed:               Hx of syphilis             -- labs for RPR, ID consult   -- no need for further treatment per chart review, RPR 1:32               AIDS             -- Continue home med Biktarvy 1 tab daily             -- ID consult - Labs for HIV 1 RNA, CD4 count   -- chart reviewed, recommend continue home med                Osteomyelitis  of R. Fifth digit             -- Per chart review underwent operative debridement 2/22 and saw hand surgery again on 3/ 5. Is followed by Dr. Lucianne Muss orthopedics             -- Wound care consult -- 3/24 chart review - Is being followed by Hospitalist, ID.  Cultures positive for MRSA, E. coli and strep.  MRI of fingers pending.  Per ID, D/c Augmentin today 3/24 , continue Zyvox (linezolid) 600 mg BID for 2-4 weeks, CBC once weekly while on Zyvox.  Recommendations by hospitalist to follow up with ortho.   Left foot cellulitis   -- first metatarsal phalangeal joint area with blister   -- seen by Podiatry: I&D performed, wound cultures obtained, treated with Betadine, replaced for postoperative as well  as daily dressing changes   Hyperglycemia   -- Sliding scale insulin initiated to manage hyperglycemia (possibly stress- or medication-induced).      4. Discharge Planning:   -- Social work and case management to assist with discharge planning and identification of hospital follow-up needs prior to discharge -- Estimated LOS: 5-7 days -- Discharge Concerns: Need to establish a safety plan; Medication compliance and effectiveness -- Discharge Goals: Return home with outpatient referrals for mental health follow-up including medication management/psychotherapy  Paulene Floor, PA-C 03/28/2024, 9:03 AM

## 2024-03-28 NOTE — Progress Notes (Signed)
 Pt calm and pleasant during assessment denying SI/HI/AVH. Pt compliant with medication administration per MD orders. Pt given education, support, and encouragement to be active in his treatment plan. Pt being monitored Q 15 minutes for safety per unit protocol, remains safe on the unit

## 2024-03-28 NOTE — Plan of Care (Signed)
  Problem: Education: Goal: Knowledge of  General Education information/materials will improve Outcome: Progressing Goal: Emotional status will improve Outcome: Progressing Goal: Mental status will improve Outcome: Progressing Goal: Verbalization of understanding the information provided will improve Outcome: Progressing   Problem: Activity: Goal: Interest or engagement in activities will improve Outcome: Progressing Goal: Sleeping patterns will improve Outcome: Progressing   Problem: Coping: Goal: Ability to verbalize frustrations and anger appropriately will improve Outcome: Progressing Goal: Ability to demonstrate self-control will improve Outcome: Progressing   Problem: Health Behavior/Discharge Planning: Goal: Identification of resources available to assist in meeting health care needs will improve Outcome: Progressing Goal: Compliance with treatment plan for underlying cause of condition will improve Outcome: Progressing   Problem: Physical Regulation: Goal: Ability to maintain clinical measurements within normal limits will improve Outcome: Progressing   Problem: Safety: Goal: Periods of time without injury will increase Outcome: Progressing   Problem: Education: Goal: Ability to describe self-care measures that may prevent or decrease complications (Diabetes Survival Skills Education) will improve Outcome: Progressing Goal: Individualized Educational Video(s) Outcome: Progressing   Problem: Coping: Goal: Ability to adjust to condition or change in health will improve Outcome: Progressing   Problem: Fluid Volume: Goal: Ability to maintain a balanced intake and output will improve Outcome: Progressing   Problem: Health Behavior/Discharge Planning: Goal: Ability to identify and utilize available resources and services will improve Outcome: Progressing Goal: Ability to manage health-related needs will improve Outcome: Progressing   Problem:  Metabolic: Goal: Ability to maintain appropriate glucose levels will improve Outcome: Progressing   Problem: Nutritional: Goal: Maintenance of adequate nutrition will improve Outcome: Progressing Goal: Progress toward achieving an optimal weight will improve Outcome: Progressing   Problem: Skin Integrity: Goal: Risk for impaired skin integrity will decrease Outcome: Progressing   Problem: Tissue Perfusion: Goal: Adequacy of tissue perfusion will improve Outcome: Progressing   Problem: Clinical Measurements: Goal: Ability to avoid or minimize complications of infection will improve Outcome: Progressing   Problem: Skin Integrity: Goal: Skin integrity will improve Outcome: Progressing

## 2024-03-28 NOTE — Plan of Care (Signed)
   Problem: Education: Goal: Emotional status will improve Outcome: Progressing Goal: Mental status will improve Outcome: Progressing   Problem: Activity: Goal: Sleeping patterns will improve Outcome: Progressing

## 2024-03-28 NOTE — Progress Notes (Signed)
   03/28/24 1100  Psych Admission Type (Psych Patients Only)  Admission Status Voluntary  Psychosocial Assessment  Patient Complaints None (patient had no complaints to voice at this time.)  Eye Contact Fair  Facial Expression Animated  Affect Appropriate to circumstance  Speech Logical/coherent  Interaction Assertive  Motor Activity Slow  Appearance/Hygiene In scrubs;Disheveled;Poor Media planner Cooperative;Appropriate to situation  Mood Pleasant (patient's goal for today is to "focus", in which "coping skills" will help him achieve his goal. Patient states that overall he is feeling "pretty good".)  Aggressive Behavior  Effect No apparent injury  Thought Process  Coherency WDL  Content WDL  Delusions None reported or observed  Perception WDL  Hallucination None reported or observed  Judgment WDL  Confusion None  Danger to Self  Current suicidal ideation? Denies  Self-Injurious Behavior No self-injurious ideation or behavior indicators observed or expressed   Agreement Not to Harm Self Yes  Description of Agreement Verbal  Danger to Others  Danger to Others None reported or observed

## 2024-03-29 DIAGNOSIS — F319 Bipolar disorder, unspecified: Secondary | ICD-10-CM | POA: Diagnosis not present

## 2024-03-29 NOTE — Progress Notes (Signed)
   03/28/24 2000  Psych Admission Type (Psych Patients Only)  Admission Status Voluntary  Psychosocial Assessment  Patient Complaints None  Eye Contact Fair  Facial Expression Animated  Affect Appropriate to circumstance  Speech Logical/coherent  Interaction Assertive;Forwards little  Motor Activity Slow  Appearance/Hygiene Disheveled;Body odor  Behavior Characteristics Cooperative;Calm  Mood Pleasant  Aggressive Behavior  Effect No apparent injury  Thought Process  Coherency WDL  Content WDL  Delusions None reported or observed  Perception WDL  Hallucination None reported or observed  Judgment WDL  Confusion None  Danger to Self  Current suicidal ideation? Denies  Self-Injurious Behavior No self-injurious ideation or behavior indicators observed or expressed   Agreement Not to Harm Self Yes  Description of Agreement verbal  Danger to Others  Danger to Others None reported or observed   Patient alert and oriented x 4, he denies SI/HI/AVH, interacting appropriately with peers and staff, complaint with medication regimen no distress noted, will continue to monitor.

## 2024-03-29 NOTE — [facility] Group Notes (Signed)
 [facility] Group Notes:  (Nursing/MHT/Case Management/Adjunct)  Date:  03/29/2024  Time:  12:04 AM  Type of Therapy:  Group Therapy  Participation Level:  Active  Participation Quality:  Appropriate and Attentive  Affect:  Appropriate  Cognitive:  Alert and Appropriate  Insight:  Appropriate and Good  Engagement in Group:  Engaged  Modes of Intervention:  Discussion  Summary of Progress/Problems: Wrapup group was about "Anger Management"  Tacy Dura 03/29/2024, 12:04 AM

## 2024-03-29 NOTE — Plan of Care (Signed)
   Problem: Education: Goal: Emotional status will improve Outcome: Progressing   Problem: Education: Goal: Mental status will improve Outcome: Progressing

## 2024-03-29 NOTE — Group Note (Signed)
 Date:  03/29/2024 Time:  10:12 AM  Group Topic/Focus:  Dimensions of Wellness:   The focus of this group is to introduce the topic of wellness and discuss the role each dimension of wellness plays in total health.    Participation Level:  Active  Participation Quality:  Appropriate  Affect:  Appropriate  Cognitive:  Appropriate  Insight: Appropriate  Engagement in Group:  Engaged  Modes of Intervention:  Activity  Additional Comments:    [name] Shneur [place] 03/29/2024, 10:12 AM

## 2024-03-29 NOTE — [facility] Group Notes (Signed)
 [facility] Group Notes:  (Nursing/MHT/Case Management/Adjunct)  Date:  03/29/2024  Time:  10:26 PM  Type of Therapy:  Group Therapy  Participation Level:  Active  Participation Quality:  Appropriate and Attentive  Affect:  Appropriate  Cognitive:  Alert and Appropriate  Insight:  Appropriate and Good  Engagement in Group:  Engaged  Modes of Intervention:  Discussion  Summary of Progress/Problems: Wrapup group "How was your day"  Tacy Dura 03/29/2024, 10:26 PM

## 2024-03-29 NOTE — Progress Notes (Signed)
 [facility] MD Progress Note  03/29/2024 11:23 AM Mark Galloway  MRN:  [id number]  38 year old never married African-American male with reported history of bipolar disorder, stimulant use disorder, medical history of AIDS, syphilis, cellulitis of right fifth digit with recent debridement, presented to [facility] on 3/13 accompanied by his mother, with chief complaint of depression and anxiety, thoughts of wanting to hurt manager at [employer] weeks the day prior to presenting, lack of sleep, homelessness, abnormal behaviors such as going into people's homes uninvited as well as their vehicles.    Subjective:   Patient's case discussed with multidisciplinary team, all vitals and notes were reviewed.  No reported behavioral issues overnight.  Patient is seen for reassessment, he remains psychiatrically stable for discharge.   Denies any psychiatric symptoms. Eating and sleeping without issues.  He is med compliant.  Per social work team, he has an appointment scheduled with his orthopedic surgeon for Monday, clarified today with SW team, he is actually scheduled to follow up with Dr. Lucianne Muss on 4/1 which is next Tuesday, pt will be discharged to his appointment at that time.  Did communicate with ID team via secure chat to update them on dc details.  Principal Problem: Bipolar 1 disorder ([name]) Diagnosis: Principal Problem:   Bipolar 1 disorder ([name]) Active Problems:   AIDS ([name])   Paranoid schizophrenia ([name])   Finger osteomyelitis ([name])   Cellulitis of foot, left   HTN (hypertension)   MRSA infection   Tenosynovitis of finger  Total Time spent with patient: 1 hr   Past Psychiatric History:  DX: Per chart review-bipolar disorder with psychotic features Outpatient provider: None Current caregiver:  Patient is own guardian/ care giver Past hospitalizations: [facility] 2021 for mania and psychosis, patient reports in 2024 at a facility in [place] x 3 weeks Medication trials : Zyprexa, per chart  review-lithium Suicide attempts: Patient denies Patient denies ever having an Act/CST team. Denies ECT, Clozaril treatments.  Past Medical History:  Past Medical History:  Diagnosis Date   Bipolar 1 disorder ([name])    HIV infection ([name])    Schizophrenia ([name])    Shingles     Past Surgical History:  Procedure Laterality Date   I & D EXTREMITY Right 02/22/2024   Procedure: IRRIGATION AND DEBRIDEMENT EXTREMITY;  Surgeon: Betha Loa, MD;  Location: [facility] OR;  Service: Orthopedics;  Laterality: Right;   IRRIGATION AND DEBRIDEMENT FOOT Left    Family History: History reviewed. No pertinent family history. Family Psychiatric  History:  Patient reports maternal grandmother-schizophrenia Patient denies any family history of suicide, substance use disorder  Social History:  Social History   Substance and Sexual Activity  Alcohol Use Yes   Comment: 3-4 per week     Social History   Substance and Sexual Activity  Drug Use Never    Social History   Socioeconomic History   Marital status: Single    Spouse name: Not on file   Number of children: 0   Years of education: 16   Highest education level: Not on file  Occupational History   Occupation: Public relations account executive  Tobacco Use   Smoking status: Some Days    Types: Cigars, Cigarettes   Smokeless tobacco: Never   Tobacco comments:    2 per week  Vaping Use   Vaping status: Never Used  Substance and Sexual Activity   Alcohol use: Yes    Comment: 3-4 per week   Drug use: Never   Sexual activity: Not Currently  Comment: accepted condoms  Other Topics Concern   Not on file  Social History Narrative   Not on file   Social Drivers of Health   Financial Resource Strain: High Risk (01/04/2024)   Received from [facility]   Overall Financial Resource Strain (CARDIA)    Difficulty of Paying Living Expenses: Very hard  Food Insecurity: Food Insecurity Present (03/14/2024)   Hunger Vital Sign    Worried About Running Out  of Food in the Last Year: Sometimes true    Ran Out of Food in the Last Year: Sometimes true  Transportation Needs: No Transportation Needs (03/14/2024)   PRAPARE - Administrator, Civil Service (Medical): No    Lack of Transportation (Non-Medical): No  Recent Concern: Transportation Needs - Unmet Transportation Needs (02/23/2024)   PRAPARE - Administrator, Civil Service (Medical): Yes    Lack of Transportation (Non-Medical): Yes  Physical Activity: Not on file  Stress: Not on file  Social Connections: Unknown (05/04/2022)   Received from [facility]   Social Network    Social Network: Not on file   Additional Social History:                         Sleep: Good  Appetite:  Good  Current Medications: Current Facility-Administered Medications  Medication Dose Route Frequency Provider Last Rate Last Admin   acetaminophen (TYLENOL) tablet 650 mg  650 mg Oral Q6H PRN Lorretta Harp, MD   650 mg at 03/26/24 1057   alum & mag hydroxide-simeth (MAALOX/MYLANTA) 200-200-20 MG/5ML suspension 30 mL  30 mL Oral Q4H PRN Zaim Nitta, PA-C       ascorbic acid (VITAMIN C) tablet 1,000 mg  1,000 mg Oral Daily Ata Pecha, PA-C   1,000 mg at 03/29/24 0905   bictegravir-emtricitabine-tenofovir AF (BIKTARVY) 50-200-25 MG per tablet 1 tablet  1 tablet Oral Daily Marvelyn Bouchillon, PA-C   1 tablet at 03/29/24 1610   haloperidol (HALDOL) tablet 5 mg  5 mg Oral TID PRN Tumeka Chimenti, PA-C   5 mg at 03/27/24 1535   And   diphenhydrAMINE (BENADRYL) capsule 50 mg  50 mg Oral TID PRN Traniya Prichett, PA-C   50 mg at 03/27/24 1535   haloperidol lactate (HALDOL) injection 5 mg  5 mg Intramuscular TID PRN Deborah Lazcano, PA-C       And   diphenhydrAMINE (BENADRYL) injection 50 mg  50 mg Intramuscular TID PRN Mercades Bajaj, PA-C       And   LORazepam (ATIVAN) injection 2 mg  2 mg Intramuscular TID PRN Jeanette Moffatt, PA-C        haloperidol lactate (HALDOL) injection 10 mg  10 mg Intramuscular TID PRN Teven Mittman, PA-C       And   diphenhydrAMINE (BENADRYL) injection 50 mg  50 mg Intramuscular TID PRN Lidya Mccalister, PA-C       And   LORazepam (ATIVAN) injection 2 mg  2 mg Intramuscular TID PRN Manahil Vanzile, PA-C   2 mg at 03/23/24 1743   docusate sodium (COLACE) capsule 100 mg  100 mg Oral BID Annie Saephan, PA-C   100 mg at 03/29/24 9604   hydrALAZINE (APRESOLINE) tablet 50 mg  50 mg Oral Q2H PRN Lorretta Harp, MD       hydrOXYzine (ATARAX) tablet 25 mg  25 mg Oral TID PRN Riata Ikeda, PA-C   25 mg at 03/26/24 1754   ibuprofen (  ADVIL) tablet 800 mg  800 mg Oral Q6H PRN Omarian Jaquith, PA-C   800 mg at 03/28/24 1610   linezolid (ZYVOX) tablet 600 mg  600 mg Oral Q12H Lynn Ito, MD   600 mg at 03/29/24 0905   magnesium hydroxide (MILK OF MAGNESIA) suspension 30 mL  30 mL Oral Daily PRN Lillianah Swartzentruber, PA-C       melatonin tablet 5 mg  5 mg Oral QHS Venus Gilles, PA-C   5 mg at 03/28/24 2213   nicotine (NICODERM CQ - dosed in mg/24 hours) patch 21 mg  21 mg Transdermal Daily Lorretta Harp, MD   21 mg at 03/24/24 0741   nystatin (MYCOSTATIN) 100000 UNIT/ML suspension 500,000 Units  5 mL Oral QID Rydan Gulyas, PA-C   500,000 Units at 03/29/24 0908   OLANZapine zydis (ZYPREXA) disintegrating tablet 15 mg  15 mg Oral BID Donise Woodle, PA-C   15 mg at 03/29/24 0905   propranolol (INDERAL) tablet 10 mg  10 mg Oral BID Myriam Forehand, NP   10 mg at 03/29/24 9604    Lab Results:  No results found for this or any previous visit (from the past 48 hours).   Blood Alcohol level:  Lab Results  Component Value Date   ETH <10 10/01/2022   ETH <10 03/25/2020    Metabolic Disorder Labs: Lab Results  Component Value Date   HGBA1C 5.1 03/15/2024   MPG 99.67 03/15/2024   No results found for: "PROLACTIN" Lab Results  Component Value Date   CHOL 108  03/15/2024   TRIG 106 03/15/2024   HDL 39 (L) 03/15/2024   CHOLHDL 2.8 03/15/2024   VLDL 21 03/15/2024   LDLCALC 48 03/15/2024   LDLCALC 40 11/07/2022    Physical Findings: AIMS:  , ,  ,  ,    CIWA:    COWS:     Musculoskeletal: Strength & Muscle Tone: within normal limits Gait & Station: normal Patient leans: N/A  Psychiatric Specialty Exam:  Presentation  General Appearance:  Disheveled  Eye Contact: Good  Speech: Clear and Coherent; Normal Rate  Speech Volume: Normal  Handedness: Right   Mood and Affect  Mood: Euthymic  Affect: Appropriate   Thought Process  Thought Processes: Coherent; Goal Directed; Linear  Descriptions of Associations:Intact  Orientation:Full (Time, Place and Person)  Thought Content:Logical  History of Schizophrenia/Schizoaffective disorder:No  Duration of Psychotic Symptoms:N/A  Hallucinations:No data recorded   Ideas of Reference:None  Suicidal Thoughts:No data recorded   Homicidal Thoughts:No data recorded    Sensorium  Memory: Immediate Good; Recent Good; Remote Good  Judgment: Fair  Insight: Fair   Art therapist  Concentration: Good  Attention Span: Good  Recall: Good  Fund of Knowledge: Fair  Language: Fair   Psychomotor Activity  Psychomotor Activity: No data recorded    Assets  Assets: Communication Skills; Desire for Improvement; Social Support   Sleep  Sleep: No data recorded     Physical Exam: Physical Exam Vitals and nursing note reviewed.  Constitutional:      Appearance: Normal appearance.  HENT:     Head: Normocephalic and atraumatic.  Pulmonary:     Effort: Pulmonary effort is normal.  Musculoskeletal:     Cervical back: Normal range of motion.     Left lower leg: Edema present.  Skin:    General: Skin is dry.     Comments: cellulitis of the left foot and osteomyelitis of the right little finger,   Neurological:  Mental Status: He is  alert and oriented to person, place, and time. Mental status is at baseline.  Psychiatric:        Attention and Perception: Attention and perception normal.        Mood and Affect: Mood and affect normal.        Speech: Speech normal.        Behavior: Behavior normal. Behavior is cooperative.        Thought Content: Thought content normal.        Cognition and Memory: Cognition and memory normal.        Judgment: Judgment is impulsive.    Review of Systems  Skin:        cellulitis of the left foot and osteomyelitis of the right little finger,   Psychiatric/Behavioral:  Positive for substance abuse.   All other systems reviewed and are negative.  Blood pressure 118/71, pulse (!) 101, temperature 98.1 F (36.7 C), resp. rate 18, height 5\' 9"  (1.753 m), weight 72.1 kg, SpO2 99%. Body mass index is 23.48 kg/m.   Treatment Plan Summary:  1.    Safety and Monitoring:   --  Voluntary admission to inpatient psychiatric unit for safety, stabilization and treatment -- Daily contact with patient to assess and evaluate symptoms and progress in treatment -- Patient's case to be discussed in multi-disciplinary team meeting -- Observation Level : q15 minute checks -- Vital signs:  q12 hours -- Precautions: suicide   2. Psychiatric Diagnoses and Treatment:  03/29/2024 -- Continue Zyprexa 15 mg twice daily for mood stabilization/psychosis -- Continue hydroxyzine 25 mg TID PRN -- Continue melatonin 5 mg HS for sleep aid   03/28/2024 -- Continue Zyprexa 15 mg twice daily for mood stabilization/psychosis -- Continue hydroxyzine 25 mg TID PRN -- Continue melatonin 5 mg HS for sleep aid   03/27/2024 -- Continue Zyprexa 15 mg twice daily for mood stabilization/psychosis -- Continue hydroxyzine 25 mg TID PRN -- Continue melatonin 5 mg HS for sleep aid   03/26/2024 -- Continue Zyprexa 15 mg twice daily for mood stabilization/psychosis -- Continue hydroxyzine 25 mg TID PRN -- Continue  melatonin 5 mg HS for sleep aid   03/25/2024 -- Continue Zyprexa 15 mg twice daily for mood stabilization/psychosis -- Continue hydroxyzine 25 mg TID PRN -- Continue melatonin 5 mg HS for sleep aid  -- Will need to discuss with team, to explore options of long-term inpatient substance use treatment, otherwise patient may have to be discharged to a shelter with proper wound education, supplies, provided oral antibiotics at discharge.  May also explore discussing with infectious disease and hospitalists possibility of admitting to medical unit for further medical stabilization once psychiatrically cleared.  03/24/2024 -- Agrees Zyprexa 10 mg twice daily to 15 mg twice daily for mood stabilization/psychosis -- Continue hydroxyzine 25 mg TID PRN -- Continue melatonin 5 mg HS for sleep aid  -- Will need to discuss with team, to explore options of long-term inpatient substance use treatment, otherwise patient may have to be discharged to a shelter with proper wound education, supplies, provided oral antibiotics at discharge.  May also explore discussing with infectious disease and hospitalists possibility of admitting to medical unit for further medical stabilization once psychiatrically cleared.   03/23/2024 -- Continue Zyprexa 10 mg HS for mood/psychosis -- Continue hydroxyzine 25 mg TID PRN -- Start melatonin 5 mg HS for sleep aid  -- Will need to discuss with team, have family meeting to discuss safe dispo  03/15/2024 --  Continue Zyprexa 5 mg BID for mood/psychosis -- Continue hydroxyzine 25 mg TID PRN -- Start melatonin 5 mg HS for sleep aid  -- Labs ordered for HbA1c and lipid panel    --  The risks/benefits/side-effects/alternatives to this medication were discussed in detail with the patient and time was given for questions. The patient consents to medication trial. -- Metabolic profile and EKG monitoring obtained while on an atypical antipsychotic  -- Encouraged patient to participate in  unit milieu and in scheduled group therapies -- Short Term Goals: Ability to identify changes in lifestyle to reduce recurrence of condition will improve, Ability to verbalize feelings will improve, Ability to disclose and discuss suicidal ideas, Ability to demonstrate self-control will improve, Ability to identify and develop effective coping behaviors will improve, Ability to maintain clinical measurements within normal limits will improve, Compliance with prescribed medications will improve, and Ability to identify triggers associated with substance abuse/mental health issues will improve -- Long Term Goals: Improvement in symptoms so as ready for discharge        3. Medical Issues Being Addressed:               Hx of syphilis             -- labs for RPR, ID consult   -- no need for further treatment per chart review, RPR 1:32               AIDS             -- Continue home med Biktarvy 1 tab daily             -- ID consult - Labs for HIV 1 RNA, CD4 count   -- chart reviewed, recommend continue home med                Osteomyelitis  of R. Fifth digit             -- Per chart review underwent operative debridement 2/22 and saw hand surgery again on 3/ 5. Is followed by Dr. Lucianne Muss orthopedics             -- Wound care consult -- 3/24 chart review - Is being followed by Hospitalist, ID.  Cultures positive for MRSA, E. coli and strep.  MRI of fingers pending.  Per ID, D/c Augmentin today 3/24 , continue Zyvox (linezolid) 600 mg BID for 2-4 weeks, CBC once weekly while on Zyvox.  Recommendations by hospitalist to follow up with ortho.   Left foot cellulitis   -- first metatarsal phalangeal joint area with blister   -- seen by Podiatry: I&D performed, wound cultures obtained, treated with Betadine, replaced for postoperative as well as daily dressing changes   Hyperglycemia   -- Sliding scale insulin initiated to manage hyperglycemia (possibly stress- or medication-induced).      4. Discharge  Planning:   -- Social work and case management to assist with discharge planning and identification of hospital follow-up needs prior to discharge -- Estimated LOS: 5-7 days -- Discharge Concerns: Need to establish a safety plan; Medication compliance and effectiveness -- Discharge Goals: Return home with outpatient referrals for mental health follow-up including medication management/psychotherapy  Paulene Floor, PA-C 03/29/2024, 10:40 AM

## 2024-03-29 NOTE — Progress Notes (Signed)
   03/29/24 1803  Psych Admission Type (Psych Patients Only)  Admission Status Voluntary  Psychosocial Assessment  Patient Complaints None  Eye Contact Fair  Facial Expression Animated  Affect Appropriate to circumstance  Speech Logical/coherent  Interaction Assertive  Motor Activity Slow  Appearance/Hygiene Disheveled  Behavior Characteristics Cooperative  Mood Pleasant  Thought Process  Coherency WDL  Content WDL  Delusions None reported or observed  Perception WDL  Hallucination None reported or observed  Judgment WDL  Confusion None  Danger to Self  Current suicidal ideation? Denies  Danger to Others  Danger to Others None reported or observed

## 2024-03-29 NOTE — Group Note (Signed)
 Date:  03/29/2024 Time:  9:59 AM  Group Topic/Focus:  Dimensions of Wellness:   The focus of this group is to introduce the topic of wellness and discuss the role each dimension of wellness plays in total health.    Participation Level:  Active  Participation Quality:  Appropriate  Affect:  Appropriate  Cognitive:  Appropriate  Insight: Appropriate  Engagement in Group:  Engaged  Modes of Intervention:  Activity  Additional Comments:    Wilford Corner 03/29/2024, 9:59 AM

## 2024-03-30 ENCOUNTER — Other Ambulatory Visit: Payer: Self-pay | Admitting: Infectious Diseases

## 2024-03-30 DIAGNOSIS — A4902 Methicillin resistant Staphylococcus aureus infection, unspecified site: Secondary | ICD-10-CM

## 2024-03-30 DIAGNOSIS — M86149 Other acute osteomyelitis, unspecified hand: Secondary | ICD-10-CM

## 2024-03-30 DIAGNOSIS — L02416 Cutaneous abscess of left lower limb: Secondary | ICD-10-CM

## 2024-03-30 NOTE — Progress Notes (Cosign Needed Addendum)
 Patient ID: Mark Galloway, male   DOB: 1986-07-27, 38 y.o.   MRN: [id number]  Mark Galloway is a 38 year-old  African-American male with reported history of bipolar disorder, stimulant use disorder, medical history of AIDS, syphilis, cellulitis of right fifth digit with recent debridement, presented to [facility] on 3/13 accompanied by his mother, with chief complaint of depression and anxiety, along with homicidal thoughts toward his  manager. Patient had been experiencing  many days without any sleep, homelessness,  homelessness, abnormal behaviors such as going into people's homes uninvited as well as their vehicles.  Per chart review, patient was evaluated  and he reported that his mother made him come to the hospital because he is currently in between places, has been homeless for the last 2 weeks.  Stated that she thinks that " it will help" for him to be here at the hospital.  He stated that prior to becoming homeless he was living with his mother however they are no longer able to live in their home, and he did not  provide a reason. Patient admitted to hearing voices, on and off, and the last time he heard was 2 weeks before admission. Patient admitted that he wanted to hurt the manager at the motel [facility]).  Patient reported that he had been experiencing depressed mood along with sleep deprivation. Patient was not taking any medications and not following up in outpatient services. Patient reported methamphetamine use.  He denied SI/HI/AVH.  Patient was admitted and started on Zyprexa as well as other medications to address his medical needs.   Assessment: 38 year-old who is pleasant and cooperative upon approach. He has been active in the milieu with no sign of distress. Upon this assessment, patient is alert and oriented x 4. His thought process is clear, coherent and goal-directed. Patient does not appear to be preoccupied or responding to internal stimuli. He denies SI/HI/AVH. Patient appears healthy  and well nourished. He has good eye contact and his thought process is coherent/goal directed. Patient reports no emotional distress. He admits that he was feeling overwhelmed prior to admission prior to not being able to rest. He admits that he did mention that he would hurt the manager at [facility] but that was out of anger and frustration. Patient reports that he had not been taking any medications and  was not receiving any therapy. He is now motivated for outpatient services and wants to improve his health condition.  He reports that he has been getting decent sleep here and his appetite is good.   Patient expresses readiness for discharge with a plan to improve on self-care by taking medications and getting into therapy.  Dr Irwin Brakeman is consulted and recommend reassessment  in AM for possible discharge.

## 2024-03-30 NOTE — Progress Notes (Signed)
 Date of Admission:  03/14/2024      ID: Mark Galloway is a 38 y.o. male Principal Problem:   Bipolar 1 disorder ([name]) Active Problems:   AIDS ([name])   Paranoid schizophrenia ([name])   Finger osteomyelitis ([name])   Cellulitis of foot, left   HTN (hypertension)   MRSA infection   Tenosynovitis of finger  Mark Galloway is a 38 y.o. with a history of Bipolar disorder, AIDS, treated syphilis , followed at [facility] for AIDS was admitted to [facility] for depression/anxiety and homelessness   Recently was in 02/21/24-02/24/24 admitted for abscess finger I/D  MRSA and took bactrim Followed by Betha Loa   Pt has been treated for syphilis with 3 doses of Benzathine penicillin on 03/08/23, 03/15/23 and 03/22/23. LP done then VDRL neg- 11 wbc That time RPR 1:32 HE then left [place] and went to [place]. Was hospitalized in [facility] in Aug 2024 and RPR then was 1:256- This was a new infection and he received one dose of Benzathine P 2.4 Mu Then on Jan 05, 2024 he was back in the hospital for diarrhea and RPR then was 1;128, appropriate response Pt has been taking Biktarvy for 3 months straight now HE was non complaint before. Says he is homeless  Subjective: Pt is feeling better  Medications:   ascorbic acid  1,000 mg Oral Daily   bictegravir-emtricitabine-tenofovir AF  1 tablet Oral Daily   docusate sodium  100 mg Oral BID   linezolid  600 mg Oral Q12H   melatonin  5 mg Oral QHS   nicotine  21 mg Transdermal Daily   nystatin  5 mL Oral QID   OLANZapine zydis  15 mg Oral BID   propranolol  10 mg Oral BID    Objective: Vital signs in last 24 hours: Patient Vitals for the past 24 hrs:  BP Temp Temp src Pulse Resp SpO2  03/30/24 0647 110/61 98.3 F (36.8 C) Oral 91 18 99 %  03/29/24 1733 117/76 (!) 97.5 F (36.4 C) -- (!) 103 -- 98 %  03/29/24 1656 112/69 -- -- (!) 106 -- --      PHYSICAL EXAM:  General: Alert, cooperative, no distress, appears stated age.  Lungs: Clear to auscultation  bilaterally. No Wheezing or Rhonchi. No rales. Heart: Regular rate and rhythm, no murmur, rub or gallop. Abdomen: Soft, non-tender,not distended. Bowel sounds normal. No masses Extremities: rt hand The wound is better than before on the 5th finger Some maceration around the wound- he is getting the hand wet and dressing had not been changed in 3 days      Left foot   Dry , no erythema  Lymph: Cervical, supraclavicular normal. Neurologic: Grossly non-focal  Lab Results    Latest Ref Rng & Units 03/26/2024    6:31 AM 03/22/2024    6:45 AM 03/21/2024    1:01 PM  CBC  WBC 4.0 - 10.5 K/uL 4.5  4.6  7.4   Hemoglobin 13.0 - 17.0 g/dL 11.9  14.7  82.9   Hematocrit 39.0 - 52.0 % 33.0  33.7  33.2   Platelets 150 - 400 K/uL 362  306  317        Latest Ref Rng & Units 03/22/2024    6:45 AM 03/18/2024    6:41 AM 03/14/2024    9:05 AM  CMP  Glucose 70 - 99 mg/dL 562  130  865   BUN 6 - 20 mg/dL 13  13  10  Creatinine 0.61 - 1.24 mg/dL 9.14  7.82  9.56   Sodium 135 - 145 mmol/L 134  135  133   Potassium 3.5 - 5.1 mmol/L 4.0  4.3  4.4   Chloride 98 - 111 mmol/L 104  105  102   CO2 22 - 32 mmol/L 23  24  21    Calcium 8.9 - 10.3 mg/dL 9.1  9.3  9.0   Total Protein 6.5 - 8.1 g/dL  8.3  7.6   Total Bilirubin 0.0 - 1.2 mg/dL  0.4  <2.1   Alkaline Phos 38 - 126 U/L  71  70   AST 15 - 41 U/L  22  23   ALT 0 - 44 U/L  16  12       Microbiology: Wound culture MRSA ESBL ecoli rare     Assessment/Plan: MRSA infection of the rt 5th finger S/p I/D in feb HE was non compliant with bactrim as OP because he was homeless Now the culture is MRSA + esbl ecoli ( rare- which is likely colonization or contamination)  MRI -osteo of the finger questioned Pt is currently on linezolid=day 9 On discharge tomorrow will go on Bactrim DS 2 BID for 2 weeks. Will need CBC/CMP once a week  while on bactrim HE has  follow up with Dr.kuzma hand surgeon tomorrow  Left foot MRSA abscess- s/p I/d doing  [facility] better  AIDS_ on biktarvy- seem to be adherent now- Cd4 is 163 ( was 60 before) HIV RNA 60 Continue Biktarvy Follow with provider at [facility] on 04/03/24 . 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 Appropriate response now- it is 1;32 No further treatment or testing needed now Can check in 3 months at His HIV provider's office   Bipolar disorder Schizophrenia   Discussed the management with patient, Dr.Jadapelle

## 2024-03-30 NOTE — Progress Notes (Signed)
   03/30/24 2200  Psych Admission Type (Psych Patients Only)  Admission Status Voluntary  Psychosocial Assessment  Patient Complaints None  Eye Contact Fair  Facial Expression Animated  Affect Appropriate to circumstance  Speech Logical/coherent  Interaction Assertive  Motor Activity Slow  Appearance/Hygiene Disheveled  Behavior Characteristics Cooperative;Appropriate to situation  Mood Pleasant  Aggressive Behavior  Effect No apparent injury  Thought Process  Coherency WDL  Content WDL  Delusions None reported or observed  Perception WDL  Hallucination None reported or observed  Judgment WDL  Confusion None  Danger to Self  Current suicidal ideation? Denies  Self-Injurious Behavior No self-injurious ideation or behavior indicators observed or expressed   Agreement Not to Harm Self Yes  Description of Agreement Verbal  Danger to Others  Danger to Others None reported or observed

## 2024-03-30 NOTE — Group Note (Signed)
 [facility] LCSW Group Therapy Note    Group Date: 03/30/2024 Start Time: 1315 End Time: 1415  Type of Therapy and Topic:  Group Therapy:  Overcoming Obstacles  Participation Level:  BHH PARTICIPATION LEVEL: Active  Mood:  Description of Group:   In this group patients will be encouraged to explore what they see as obstacles to their own wellness and recovery. They will be guided to discuss their thoughts, feelings, and behaviors related to these obstacles. The group will process together ways to cope with barriers, with attention given to specific choices patients can make. Each patient will be challenged to identify changes they are motivated to make in order to overcome their obstacles. This group will be process-oriented, with patients participating in exploration of their own experiences as well as giving and receiving support and challenge from other group members.  Therapeutic Goals: 1. Patient will identify personal and current obstacles as they relate to admission. 2. Patient will identify barriers that currently interfere with their wellness or overcoming obstacles.  3. Patient will identify feelings, thought process and behaviors related to these barriers. 4. Patient will identify two changes they are willing to make to overcome these obstacles:    Summary of Patient Progress Patient was present in group.  Patient was appropriate and shared fair insight.  Patient was able to engage in discussion on how communication skills are important.  Patient was supportive of others and offered appropriate feedback.  Insight was fair.    Therapeutic Modalities:   Cognitive Behavioral Therapy Solution Focused Therapy Motivational Interviewing Relapse Prevention Therapy   Harden Mo, LCSW

## 2024-03-30 NOTE — Plan of Care (Signed)
  Problem: Health Behavior/Discharge Planning: Goal: Compliance with treatment plan for underlying cause of condition will improve Outcome: Progressing   Problem: Metabolic: Goal: Ability to maintain appropriate glucose levels will improve Outcome: Progressing   Problem: Nutritional: Goal: Maintenance of adequate nutrition will improve Outcome: Progressing   Problem: Clinical Measurements: Goal: Ability to avoid or minimize complications of infection will improve Outcome: Progressing   Problem: Skin Integrity: Goal: Skin integrity will improve Outcome: Progressing

## 2024-03-30 NOTE — Progress Notes (Signed)
   03/30/24 0857  Psych Admission Type (Psych Patients Only)  Admission Status Voluntary  Psychosocial Assessment  Patient Complaints None  Eye Contact Fair  Facial Expression Animated  Affect Appropriate to circumstance  Speech Logical/coherent  Interaction Assertive  Motor Activity Slow  Appearance/Hygiene Disheveled;Body odor  Behavior Characteristics Cooperative;Appropriate to situation  Mood Pleasant  Aggressive Behavior  Effect No apparent injury  Thought Process  Coherency WDL  Content WDL  Delusions None reported or observed  Perception WDL  Hallucination None reported or observed  Judgment WDL  Confusion None  Danger to Self  Current suicidal ideation? Denies  Self-Injurious Behavior No self-injurious ideation or behavior indicators observed or expressed   Agreement Not to Harm Self Yes  Description of Agreement verbal  Danger to Others  Danger to Others None reported or observed

## 2024-03-30 NOTE — Plan of Care (Signed)
  Problem: Education: Goal: Knowledge of [facility] Education information/materials will improve Outcome: Progressing   Problem: Education: Goal: Emotional status will improve Outcome: Progressing   Problem: Education: Goal: Verbalization of understanding the information provided will improve Outcome: Progressing   Problem: Activity: Goal: Interest or engagement in activities will improve Outcome: Progressing

## 2024-03-30 NOTE — Group Note (Signed)
 Date:  03/30/2024 Time:  10:06 PM  Group Topic/Focus:  Wrap-Up Group:   The focus of this group is to help patients review their daily goal of treatment and discuss progress on daily workbooks.    Participation Level:  Active  Participation Quality:  Appropriate and Attentive  Affect:  Appropriate  Cognitive:  Alert and Appropriate  Insight: Appropriate  Engagement in Group:  Engaged and Improving  Modes of Intervention:  Discussion and Orientation  Additional Comments:     Maglione,Lanny Donoso E 03/30/2024, 10:06 PM

## 2024-03-30 NOTE — Progress Notes (Signed)
     03/28/24 2000  Charting Type  Charting Type Shift assessment  Safety Check Verification  Has the RN verified the 15 minute safety check completion? Yes  Neurological  Neuro (WDL) WDL  Orientation Level Oriented X4  HEENT  HEENT (WDL) WDL  Respiratory  Respiratory (WDL) WDL  Cardiac  Cardiac (WDL) WDL  Vascular  Vascular (WDL) WDL  Integumentary  Integumentary (WDL) X (see admission note)  Braden Scale (Ages 8 and up)  Sensory Perceptions 4  Moisture 4  Activity 4  Mobility 3  Nutrition 3  Friction and Shear 3  Braden Scale Score 21  Musculoskeletal  Musculoskeletal (WDL) X (see wound care)  Assistive Device None  Musculoskeletal Details  RUE Limited movement  RUE Ortho/Supportive Device Other (Comment) (wraped with gauze)  Right Wrist Limited movement  Right Fingers Injury/trauma  Left Foot Injury/trauma  Gastrointestinal  Gastrointestinal (WDL) WDL  GU Assessment  Genitourinary (WDL) WDL  Neurological  Level of Consciousness Alert   No distress's noted , affect is bright, he denies si/hi/avh 15 minutes safety checks maintained

## 2024-03-30 NOTE — BHH Counselor (Signed)
 CSW and team are working to ensure a safe discharge. CSW provided physician with [facility] information to touch base about the patient's MRSA in his hand- NP touched base with [facility].   [facility] confirmed with CSW after speaking with NP that they are unable to offer the patient admission as they have to wait until his hand is fully recovered which "can take weeks" according to the NP.   CSW team to continue to assess.    Reymundo Poll, MSW, LCSWA 03/30/2024 4:14 PM

## 2024-03-30 NOTE — BHH Counselor (Signed)
 CSW spoke with Almira Coaster at [facility] who informed CSW that patient's admission is pending the MRSA in his hand being managed.   Gina requests that the patient and his mother follow up with [facility] after the patient's hand appointment on 03/31/24.   This has been communicated with patient.   Patient agreed and has been given [facility] information in hand and to be included in discharge paperwork.   CSW team to continue to assess.    Reymundo Poll, MSW, LCSWA 03/28/2024 10:00 AM

## 2024-03-30 NOTE — BHH Counselor (Addendum)
 CSW spoke with patient's mother Marquette Saa to further engage in safe discharge planning.   The patient has an appointment for his hand tomorrow 03/31/24 and will need transportation to the appointment. Patient's mother explained that she will be unabvle to provide transportation.  She says that the paitnet has "burned all his bridges" and that there is no one who can take him.   Mother reports that she is "nervous about what he will do once he is released and that she is scared of what he will get into unsupervised." She says that she has no way of communicating with him as he does not have a phone. She said that the patient is "just going to wander off " and after his appointment will still fail to keep the aftercare up or manage the hand and will "lose it" because he is unable to take care of himself.   Mother expressed that she wants the patient to discharged to a treatment facility. CSW explained that patient has been declined at several facilities due to the MRSA in his hand. Patient has been accepted to [facility] pending his appointment and documentation of the MRSA being managed. Mother expressed understanding but still insisted that patient be sent to a treatment facility. CSW to follow up with mother after care planning with team.   CSW communicated this with team to continue to engage in safe discharge planning.   Reymundo Poll, MSW, LCSWA 03/30/2024 11:52 AM

## 2024-03-30 NOTE — Group Note (Signed)
 Date:  03/30/2024 Time:  4:58 PM  Group Topic/Focus:  Activity Group:  The focus of the group is to promote activity for the patients and encourage them to go outside in the courtyard for some fresh air and some exercise.   Participation Level:  Active  Participation Quality:  Appropriate  Affect:  Appropriate  Cognitive:  Appropriate  Insight: Appropriate  Engagement in Group:  Engaged  Modes of Intervention:  Activity   Mark Galloway 03/30/2024, 4:58 PM

## 2024-03-30 NOTE — Group Note (Signed)
 Recreation Therapy Group Note   Group Topic:Health and Wellness  Group Date: 03/30/2024 Start Time: 1100 End Time: 1200 Facilitators: Rosina Lowenstein, LRT, CTRS Location: Courtyard  Group Description: [employer]. LRT and patients played games of basketball, drew with chalk, and played corn hole while outside in the courtyard while getting fresh air and sunlight. Music was being played in the background. LRT and peers conversed about different games they have played before, what they do in their free time and anything else that is on their minds. LRT encouraged pts to drink water after being outside, sweating and getting their heart rate up.  Goal Area(s) Addressed: Patient will build on frustration tolerance skills. Patients will partake in a competitive play game with peers. Patients will gain knowledge of new leisure interest/hobby.   Affect/Mood: Appropriate   Participation Level: Active and Engaged   Participation Quality: Independent   Behavior: Appropriate, Calm, and Cooperative   Speech/Thought Process: Coherent   Insight: Good   Judgement: Good   Modes of Intervention: Activity, Exploration, and Socialization   Patient Response to Interventions:  Attentive, Engaged, Interested , and Receptive   Education Outcome:  Acknowledges education   Clinical Observations/Individualized Feedback: Geraldine was active in their participation of session activities and group discussion. Pt chose to talk with peers and listen to music while in group. Pt interacted well with LRT and peers duration of session.    Plan: Continue to engage patient in RT group sessions 2-3x/week.   [id number] LRT, CTRS 03/30/2024 1:25 PM

## 2024-03-31 ENCOUNTER — Other Ambulatory Visit: Payer: Self-pay

## 2024-03-31 MED ORDER — NICOTINE 21 MG/24HR TD PT24
21.0000 mg | MEDICATED_PATCH | Freq: Every day | TRANSDERMAL | 0 refills | Status: DC
Start: 2024-04-01 — End: 2024-03-31
  Filled 2024-03-31: qty 28, 28d supply, fill #0

## 2024-03-31 MED ORDER — OLANZAPINE 15 MG PO TBDP: 15.0000 mg | ORAL_TABLET | Freq: Two times a day (BID) | ORAL | 0 refills | Status: DC

## 2024-03-31 MED ORDER — BIKTARVY 50-200-25 MG PO TABS: 1.0000 | ORAL_TABLET | Freq: Every day | ORAL | 0 refills | Status: DC

## 2024-03-31 MED ORDER — BIKTARVY 50-200-25 MG PO TABS
1.0000 | ORAL_TABLET | Freq: Every day | ORAL | 0 refills | Status: DC
  Filled 2024-03-31: qty 30, 30d supply, fill #0

## 2024-03-31 MED ORDER — PROPRANOLOL HCL 10 MG PO TABS
10.0000 mg | ORAL_TABLET | Freq: Two times a day (BID) | ORAL | 0 refills | Status: DC
  Filled 2024-03-31: qty 30, 15d supply, fill #0

## 2024-03-31 MED ORDER — SULFAMETHOXAZOLE-TRIMETHOPRIM 800-160 MG PO TABS
2.0000 | ORAL_TABLET | Freq: Two times a day (BID) | ORAL | 0 refills | Status: DC
Start: 2024-03-31 — End: 2024-03-31
  Filled 2024-03-31: qty 56, 14d supply, fill #0

## 2024-03-31 MED ORDER — SULFAMETHOXAZOLE-TRIMETHOPRIM 800-160 MG PO TABS: 2.0000 | ORAL_TABLET | Freq: Two times a day (BID) | ORAL | 0 refills | Status: DC

## 2024-03-31 MED ORDER — LINEZOLID 600 MG PO TABS: 600.0000 mg | ORAL_TABLET | Freq: Two times a day (BID) | ORAL | 0 refills | Status: DC

## 2024-03-31 MED ORDER — MELATONIN 5 MG PO TABS
5.0000 mg | ORAL_TABLET | Freq: Every day | ORAL | 0 refills | Status: DC
  Filled 2024-03-31: qty 30, 30d supply, fill #0

## 2024-03-31 MED ORDER — PROPRANOLOL HCL 10 MG PO TABS: 10.0000 mg | ORAL_TABLET | Freq: Two times a day (BID) | ORAL | 0 refills | Status: DC

## 2024-03-31 MED ORDER — HYDRALAZINE HCL 50 MG PO TABS: 50.0000 mg | ORAL_TABLET | ORAL | 0 refills | Status: DC | PRN

## 2024-03-31 MED ORDER — OLANZAPINE 15 MG PO TBDP
15.0000 mg | ORAL_TABLET | Freq: Two times a day (BID) | ORAL | 0 refills | Status: DC
Start: 2024-03-31 — End: 2024-03-31
  Filled 2024-03-31: qty 60, 30d supply, fill #0

## 2024-03-31 MED ORDER — SULFAMETHOXAZOLE-TRIMETHOPRIM 800-160 MG PO TABS
1.0000 | ORAL_TABLET | Freq: Two times a day (BID) | ORAL | 0 refills | Status: DC
  Filled 2024-03-31: qty 60, 30d supply, fill #0

## 2024-03-31 MED ORDER — MELATONIN 5 MG PO TABS: 5.0000 mg | ORAL_TABLET | Freq: Every day | ORAL | 0 refills | Status: DC

## 2024-03-31 MED ORDER — SULFAMETHOXAZOLE-TRIMETHOPRIM 800-160 MG PO TABS: 1.0000 | ORAL_TABLET | Freq: Two times a day (BID) | ORAL | 0 refills | Status: DC

## 2024-03-31 MED ORDER — HYDRALAZINE HCL 50 MG PO TABS
50.0000 mg | ORAL_TABLET | ORAL | 0 refills | Status: DC | PRN
  Filled 2024-03-31: qty 30, 3d supply, fill #0

## 2024-03-31 MED ORDER — NICOTINE 21 MG/24HR TD PT24: 21.0000 mg | MEDICATED_PATCH | Freq: Every day | TRANSDERMAL | 0 refills | Status: DC

## 2024-03-31 MED ORDER — LINEZOLID 600 MG PO TABS
600.0000 mg | ORAL_TABLET | Freq: Two times a day (BID) | ORAL | 0 refills | Status: DC
  Filled 2024-03-31: qty 30, 15d supply, fill #0

## 2024-03-31 NOTE — BHH Counselor (Signed)
 CSW provided patient with bus route and directions as well as bus passes.  Penni Homans, MSW, LCSW 03/31/2024 8:12 AM

## 2024-03-31 NOTE — BHH Suicide Risk Assessment (Signed)
 [facility] Discharge Suicide Risk Assessment   Principal Problem: Bipolar 1 disorder [facility]) Discharge Diagnoses: Principal Problem:   Bipolar 1 disorder ([name]) Active Problems:   AIDS ([name])   Paranoid schizophrenia ([name])   Finger osteomyelitis ([name])   Cellulitis of foot, left   HTN (hypertension)   MRSA infection   Tenosynovitis of finger   Total Time spent with patient: 30 minutes  Musculoskeletal: Strength & Muscle Tone: within normal limits Gait & Station: normal Patient leans: N/A  Psychiatric Specialty Exam  Presentation  General Appearance:  Appropriate for Environment  Eye Contact: Good  Speech: Clear and Coherent  Speech Volume: Normal  Handedness: Right   Mood and Affect  Mood: Euthymic  Duration of Depression Symptoms: Greater than two weeks  Affect: Appropriate   Thought Process  Thought Processes: Coherent; Goal Directed  Descriptions of Associations:Intact  Orientation:Full (Time, Place and Person)  Thought Content:Logical  History of Schizophrenia/Schizoaffective disorder:No  Duration of Psychotic Symptoms:N/A  Hallucinations:Hallucinations: None  Ideas of Reference:None  Suicidal Thoughts:Suicidal Thoughts: No  Homicidal Thoughts:Homicidal Thoughts: No   Sensorium  Memory: Recent Good; Immediate Good; Remote Good  Judgment: Fair  Insight: Fair   Chartered certified accountant: Fair  Attention Span: Fair  Recall: [employer] of Knowledge: Fair  Language: Fair   Psychomotor Activity  Psychomotor Activity: Psychomotor Activity: Normal   Assets  Assets: Communication Skills; Desire for Improvement; Physical Health   Sleep  Sleep: Sleep: Good Number of Hours of Sleep: 7   Physical Exam: Physical Exam ROS Blood pressure 122/72, pulse 91, temperature 98.4 F (36.9 C), resp. rate (!) 21, height 5\' 9"  (1.753 m), weight 72.1 kg, SpO2 97%. Body mass index is 23.48 kg/m.  Mental Status Per  Nursing Assessment::   On Admission:  NA  Demographic Factors:  Male  Loss Factors: Financial problems/change in socioeconomic status  Historical Factors: NA  Risk Reduction Factors:   Sense of responsibility to family, Religious beliefs about death, Positive social support, Positive therapeutic relationship, and Positive coping skills or problem solving skills  Continued Clinical Symptoms:  Previous Psychiatric Diagnoses and Treatments  Cognitive Features That Contribute To Risk:  None    Suicide Risk:  Minimal: No identifiable suicidal ideation.  Patients presenting with no risk factors but with morbid ruminations; may be classified as minimal risk based on the severity of the depressive symptoms   Follow-up Information     [facility] Follow up.   Specialty: Hand Surgery Why: You have an appointment scheduled for Tuesday, 03/31/24 at 10AM. Contact information: [address] [place] [address] [phone]         [facility] [place]. Go to.   Why: As a new patient you need to do a walk in. Please arrive by 7:15 AM Mon-Wednesday. Contact information: [address] [place],  [place]  [address]  [phone]  Sunday:Closed Monday:7:00 AM - 5:30 PM Tuesday:7:00 AM - 5:30 PM Wednesday:7:00 AM - 5:30 PM Thursday:7:00 AM - 5:30 PM Friday:8:00 AM - 1:00 PM Saturday:Closed        Rosetta Posner, DPM Follow up in 1 week(s).   Specialty: Podiatry Contact information: [address] [place] [address] [phone]                 Plan Of Care/Follow-up recommendations:  Activity:  As tolerated  Verner Chol, MD 03/31/2024, 8:50 AM

## 2024-03-31 NOTE — Discharge Summary (Signed)
 Physician Discharge Summary Note  Patient:  Mark Galloway is an 38 y.o., male MRN:  [id number] DOB:  1986/06/07 Patient phone:  [phone] (home)  Patient address:   [address] [place] [address],  Total Time spent with patient: 30 minutes  Date of Admission:  03/14/2024 Date of Discharge: 03/31/2024  Reason for Admission:  Depression, anxiety and hallucinations  Principal Problem: Bipolar 1 disorder [facility]) Discharge Diagnoses: Principal Problem:   Bipolar 1 disorder ([name]) Active Problems:   AIDS ([name])   Paranoid schizophrenia ([name])   Finger osteomyelitis ([name])   Cellulitis of foot, left   HTN (hypertension)   MRSA infection   Tenosynovitis of finger   Past Psychiatric History: Bipolar   Past Medical History:  Past Medical History:  Diagnosis Date   Bipolar 1 disorder ([name])    HIV infection ([name])    Schizophrenia ([name])    Shingles     Past Surgical History:  Procedure Laterality Date   I & D EXTREMITY Right 02/22/2024   Procedure: IRRIGATION AND DEBRIDEMENT EXTREMITY;  Surgeon: Betha Loa, MD;  Location: [facility] OR;  Service: Orthopedics;  Laterality: Right;   IRRIGATION AND DEBRIDEMENT FOOT Left    Family History: History reviewed. No pertinent family history. Family Psychiatric  History: NA Social History:  Social History   Substance and Sexual Activity  Alcohol Use Yes   Comment: 3-4 per week     Social History   Substance and Sexual Activity  Drug Use Never    Social History   Socioeconomic History   Marital status: Single    Spouse name: Not on file   Number of children: 0   Years of education: 16   Highest education level: Not on file  Occupational History   Occupation: Public relations account executive  Tobacco Use   Smoking status: Some Days    Types: Cigars, Cigarettes   Smokeless tobacco: Never   Tobacco comments:    2 per week  Vaping Use   Vaping status: Never Used  Substance and Sexual Activity   Alcohol use: Yes    Comment: 3-4  per week   Drug use: Never   Sexual activity: Not Currently    Comment: accepted condoms  Other Topics Concern   Not on file  Social History Narrative   Not on file   Social Drivers of Health   Financial Resource Strain: High Risk (01/04/2024)   Received from [facility]   Overall Financial Resource Strain (CARDIA)    Difficulty of Paying Living Expenses: Very hard  Food Insecurity: Food Insecurity Present (03/14/2024)   Hunger Vital Sign    Worried About Running Out of Food in the Last Year: Sometimes true    Ran Out of Food in the Last Year: Sometimes true  Transportation Needs: No Transportation Needs (03/14/2024)   PRAPARE - Administrator, Civil Service (Medical): No    Lack of Transportation (Non-Medical): No  Recent Concern: Transportation Needs - Unmet Transportation Needs (02/23/2024)   PRAPARE - Transportation    Lack of Transportation (Medical): Yes    Lack of Transportation (Non-Medical): Yes  Physical Activity: Not on file  Stress: Not on file  Social Connections: Unknown (05/04/2022)   Received from [facility]   Social Network    Social Network: Not on file    Hospital Course:    Mark Galloway is a 38 year-old  African-American male with reported history of bipolar disorder, stimulant use disorder, medical history of  AIDS, syphilis, cellulitis of right fifth digit with recent debridement, presented to [name] on [facility]/13 accompanied by his mother, with chief complaint of depression and anxiety, along with homicidal thoughts toward his  Production designer, theatre/television/film. Patient had been experiencing  many days without any sleep, homelessness,  homelessness, abnormal behaviors such as going into people's homes uninvited as well as their vehicles.  Per chart review, patient was evaluated  and he reported that his mother made him come to the hospital because he is currently in between places, has been homeless for the last 2 weeks.  Stated that she thinks that " it  will help" for him to be here at the hospital.  He stated that prior to becoming homeless he was living with his mother however they are no longer able to live in their home, and he did not  provide a reason. Patient admitted to hearing voices, on and off, and the last time he heard was 2 weeks before admission. Patient admitted that he wanted to hurt the manager at the motel [facility]).  Patient reported that he had been experiencing depressed mood along with sleep deprivation. Patient was not taking any medications and not following up in outpatient services. Patient reported methamphetamine use.  He denied SI/HI/AVH.  Patient was admitted and started on Zyprexa as well as other medications to address his medical needs.   During his course of treatment here, Mark Galloway was active and participated in group activities as well as treatment team. He discussed his treatment with team and remained adherent with medications. During groups, patient was encouraged to discuss what he sees as an obstacle to his well-being. Mark Galloway was appropriate and shared fair insight. He was able to engage in discussion on how communication skills are important. He was supportive of others and offered appropriate feedbacks.  Per nursing: patient has remained cooperative with no sign of distress. He is eating and sleeping well and able to express his feelings and needs appropriately.   Today upon assessment:  38 year-old who is pleasant and cooperative upon approach. He is appropriate  ain the milieu with no sign of distress. Upon this assessment, patient is alert and oriented x 4. His thought process is clear, coherent and goal-directed. Patient does not appear to be preoccupied or responding to internal stimuli. He denies SI/HI/AVH. Patient appears healthy and well nourished. He has good eye contact and his thought process is coherent/goal directed. Patient reports no emotional distress. He admits that he was feeling overwhelmed prior to  admission prior to not being able to rest. He admits that he did mention that he would hurt the manager at [facility] but that was out of anger and frustration. Patient reports that he had not been taking any medications and  was not receiving any therapy. He is now motivated for outpatient services and wants to improve his health condition.  He reports that he has been getting decent sleep here and his appetite is good   Patient expresses readiness for discharge with a plan to improve on self-care by taking medications and getting into therapy.  Dr Irwin Brakeman is consulted and discharge is recommended.        Physical Findings: AIMS:  , ,  ,  ,    CIWA:    COWS:     Musculoskeletal: Strength & Muscle Tone: within normal limits Gait & Station: normal Patient leans: N/A   Psychiatric Specialty Exam:  Presentation  General Appearance:  Appropriate for Environment  Eye Contact: Good  Speech: Clear and Coherent  Speech Volume: Normal  Handedness: Right   Mood and Affect  Mood: Euthymic  Affect: Appropriate   Thought Process  Thought Processes: Coherent; Goal Directed  Descriptions of Associations:Intact  Orientation:Full (Time, Place and Person)  Thought Content:Logical  History of Schizophrenia/Schizoaffective disorder:No  Duration of Psychotic Symptoms:N/A  Hallucinations:Hallucinations: None  Ideas of Reference:None  Suicidal Thoughts:Suicidal Thoughts: No  Homicidal Thoughts:Homicidal Thoughts: No   Sensorium  Memory: Recent Good; Immediate Good; Remote Good  Judgment: Fair  Insight: Fair   Chartered certified accountant: Fair  Attention Span: Fair  Recall: [employer] of Knowledge: Fair  Language: Fair   Psychomotor Activity  Psychomotor Activity: Psychomotor Activity: Normal   Assets  Assets: Communication Skills; Desire for Improvement; Physical Health   Sleep  Sleep: Sleep: Good Number of Hours of  Sleep: 7    Physical Exam: Physical Exam ROS Blood pressure 122/72, pulse 91, temperature 98.4 F (36.9 C), resp. rate (!) 21, height 5\' 9"  (1.753 m), weight 72.1 kg, SpO2 97%. Body mass index is 23.48 kg/m.   Social History   Tobacco Use  Smoking Status Some Days   Types: Cigars, Cigarettes  Smokeless Tobacco Never  Tobacco Comments   2 per week   Tobacco Cessation:  N/A, patient does not currently use tobacco products   Blood Alcohol level:  Lab Results  Component Value Date   ETH <10 10/01/2022   ETH <10 03/25/2020    Metabolic Disorder Labs:  Lab Results  Component Value Date   HGBA1C 5.1 03/15/2024   MPG 99.67 03/15/2024   No results found for: "PROLACTIN" Lab Results  Component Value Date   CHOL 108 03/15/2024   TRIG 106 03/15/2024   HDL 39 (L) 03/15/2024   CHOLHDL 2.8 03/15/2024   VLDL 21 03/15/2024   LDLCALC 48 03/15/2024   LDLCALC 40 11/07/2022    See Psychiatric Specialty Exam and Suicide Risk Assessment completed by Attending Physician prior to discharge.  Discharge destination:  Home  Is patient on multiple antipsychotic therapies at discharge:  No   Has Patient had three or more failed trials of antipsychotic monotherapy by history:  No  Recommended Plan for Multiple Antipsychotic Therapies: NA  Discharge Instructions     Diet - low sodium heart healthy   Complete by: As directed    Increase activity slowly   Complete by: As directed    No wound care   Complete by: As directed       Allergies as of 03/31/2024       Reactions   Peanut Oil Hives   Peanut-containing Drug Products         Medication List     STOP taking these medications    ascorbic acid 1000 MG tablet Commonly known as: VITAMIN C       TAKE these medications      Indication  Biktarvy 50-200-25 MG Tabs tablet Generic drug: bictegravir-emtricitabine-tenofovir AF Take 1 tablet by mouth daily. Start taking on: April 01, 2024  Indication: HIV Disease    hydrALAZINE 50 MG tablet Commonly known as: APRESOLINE Take 1 tablet (50 mg total) by mouth every 2 (two) hours as needed (for SBP>160).  Indication: Cardiac Failure   melatonin 5 MG Tabs Take 1 tablet (5 mg total) by mouth at bedtime.  Indication: Trouble Sleeping   nicotine 21 mg/24hr patch Commonly known as: NICODERM CQ - dosed in mg/24 hours Place 1 patch (21 mg total) onto the skin daily.  Start taking on: April 01, 2024  Indication: Nicotine Addiction   OLANZapine zydis 15 MG disintegrating tablet Commonly known as: ZYPREXA Take 1 tablet (15 mg total) by mouth 2 (two) times daily.  Indication: Depressive Phase of Manic-Depression   propranolol 10 MG tablet Commonly known as: INDERAL Take 1 tablet (10 mg total) by mouth 2 (two) times daily.  Indication: Feeling Anxious   sulfamethoxazole-trimethoprim 800-160 MG tablet Commonly known as: Bactrim DS Take 1 tablet by mouth 2 (two) times daily. What changed: You were already taking a medication with the same name, and this prescription was added. Make sure you understand how and when to take each.  Indication: Acute Episode of Chronic Bronchitis   sulfamethoxazole-trimethoprim 800-160 MG tablet Commonly known as: Bactrim DS Take 2 tablets by mouth 2 (two) times daily for 14 days. What changed: how much to take  Indication: Complicated Urinary Tract Infection        Follow-up Information     [facility] Follow up.   Specialty: Hand Surgery Why: You have an appointment scheduled for Tuesday, 03/31/24 at 10AM. Contact information: [address] [place] [address] [phone]         [facility] at [place]. Go to.   Why: As a new patient you need to do a walk in. Please arrive by 7:15 AM Mon-Wednesday. Contact information: [address] [place],  [place]  [address]  [phone]  Sunday:Closed Monday:7:00 AM - 5:30 PM Tuesday:7:00 AM - 5:30  PM Wednesday:7:00 AM - 5:30 PM Thursday:7:00 AM - 5:30 PM Friday:8:00 AM - 1:00 PM Saturday:Closed        Rosetta Posner, DPM Follow up in 1 week(s).   Specialty: Podiatry Contact information: [address] [place] [address] [phone]                 Follow-up recommendations:  Activity:  As tolerated Diet:  Regular, as recommended by PCP  Comments:  Discharge and continue treatment in outpatient setting  Signed: Olin Pia, NP 03/31/2024, 10:25 AM

## 2024-03-31 NOTE — Plan of Care (Signed)
  Problem: Education: Goal: Knowledge of  General Education information/materials will improve Outcome: Adequate for Discharge Goal: Emotional status will improve Outcome: Adequate for Discharge Goal: Mental status will improve Outcome: Adequate for Discharge Goal: Verbalization of understanding the information provided will improve Outcome: Adequate for Discharge   Problem: Activity: Goal: Interest or engagement in activities will improve Outcome: Adequate for Discharge Goal: Sleeping patterns will improve Outcome: Adequate for Discharge   Problem: Coping: Goal: Ability to verbalize frustrations and anger appropriately will improve Outcome: Adequate for Discharge Goal: Ability to demonstrate self-control will improve Outcome: Adequate for Discharge   Problem: Safety: Goal: Periods of time without injury will increase Outcome: Adequate for Discharge

## 2024-03-31 NOTE — Progress Notes (Addendum)
  [facility] Adult Case Management Discharge Plan :  Will you be returning to the same living situation after discharge:  Yes,  Patient to return home.  At discharge, do you have transportation home?: Yes,  CSW arranged transportation on patient's behalf.  Do you have the ability to pay for your medications: Yes, [facility]   Release of information consent forms completed and in the chart;  Patient's signature needed at discharge.  Patient to Follow up at:  Follow-up Information     [place] Follow up.   Specialty: Hand Surgery Why: You have an appointment scheduled for Tuesday, 03/31/24 at 10AM. Contact information: [address] [place] [address] [phone]         [facility] at [place]. Go to.   Why: As a new patient you need to do a walk in. Please arrive by 7:15 AM Mon-Wednesday. Contact information: [address] [place],  [place]  [address]  [phone]  Sunday:Closed Monday:7:00 AM - 5:30 PM Tuesday:7:00 AM - 5:30 PM Wednesday:7:00 AM - 5:30 PM Thursday:7:00 AM - 5:30 PM Friday:8:00 AM - 1:00 PM Saturday:Closed        Rosetta Posner, DPM Follow up in 1 week(s).   Specialty: Podiatry Contact information: [address] [place] [address] [phone]                 Next level of care provider has access to [facility] Link:no  Safety Planning and Suicide Prevention discussed: Yes, Education Completed; Marquette Saa, mother, [phone] has been identified by the patient as the family member/significant other with whom the patient will be residing, and identified as the person(s) who will aid the patient in the event of a mental health crisis (suicidal ideations/suicide attempt).  With written consent from the patient, the family member/significant other has been provided the following suicide prevention education, prior to the and/or following the discharge of the patient.       Has patient been referred to the Quitline?: Patient refused referral for treatment  Patient has been referred for addiction treatment: No known substance use disorder.  [name], LCSW 03/31/2024, 8:59 AM

## 2024-03-31 NOTE — Progress Notes (Signed)
   03/31/24 0539  15 Minute Checks  Location Bedroom  Visual Appearance Calm  Behavior Sleeping  Sleep (Behavioral Health Patients Only)  Calculate sleep? (Click Yes once per 24 hr at 0600 safety check) Yes  Documented sleep last 24 hours 10.75

## 2024-03-31 NOTE — Progress Notes (Signed)
 Discharge Note:  Patient denies SI/HI/AVH at this time. Discharge instructions, AVS, prescriptions, and transition record reviewed with patient. Patient agrees to comply with medication management, follow-up visit, and outpatient therapy. Patient belongings returned to patient. Patient questions and concerns addressed and answered. Patient ambulatory off unit @ 0900. Patient discharged to home with self care

## 2024-04-03 ENCOUNTER — Ambulatory Visit: Admitting: Internal Medicine

## 2024-04-03 ENCOUNTER — Ambulatory Visit: Admitting: Physician Assistant

## 2024-04-05 ENCOUNTER — Inpatient Hospital Stay (HOSPITAL_COMMUNITY)
Admission: EM | Admit: 2024-04-05 | Discharge: 2024-04-24 | DRG: 540 | Disposition: A | Discharge status: Home / Self Care | Payer: Self-pay | Attending: Internal Medicine | Admitting: Internal Medicine

## 2024-04-05 ENCOUNTER — Encounter (HOSPITAL_COMMUNITY): Payer: Self-pay

## 2024-04-05 ENCOUNTER — Other Ambulatory Visit: Payer: Self-pay

## 2024-04-05 ENCOUNTER — Emergency Department (HOSPITAL_COMMUNITY): Payer: Self-pay

## 2024-04-05 DIAGNOSIS — E871 Hypo-osmolality and hyponatremia: Secondary | ICD-10-CM | POA: Diagnosis present

## 2024-04-05 DIAGNOSIS — M65949 Unspecified synovitis and tenosynovitis, unspecified hand: Principal | ICD-10-CM

## 2024-04-05 DIAGNOSIS — M20091 Other deformity of right finger(s): Secondary | ICD-10-CM | POA: Diagnosis present

## 2024-04-05 DIAGNOSIS — Z87891 Personal history of nicotine dependence: Secondary | ICD-10-CM

## 2024-04-05 DIAGNOSIS — Z5900 Homelessness unspecified: Secondary | ICD-10-CM

## 2024-04-05 DIAGNOSIS — Z79899 Other long term (current) drug therapy: Secondary | ICD-10-CM

## 2024-04-05 DIAGNOSIS — Z91010 Allergy to peanuts: Secondary | ICD-10-CM

## 2024-04-05 DIAGNOSIS — L03119 Cellulitis of unspecified part of limb: Secondary | ICD-10-CM | POA: Diagnosis not present

## 2024-04-05 DIAGNOSIS — Z539 Procedure and treatment not carried out, unspecified reason: Secondary | ICD-10-CM | POA: Diagnosis not present

## 2024-04-05 DIAGNOSIS — Z91148 Patient's other noncompliance with medication regimen for other reason: Secondary | ICD-10-CM

## 2024-04-05 DIAGNOSIS — B20 Human immunodeficiency virus [HIV] disease: Secondary | ICD-10-CM | POA: Diagnosis not present

## 2024-04-05 DIAGNOSIS — T50996A Underdosing of other drugs, medicaments and biological substances, initial encounter: Secondary | ICD-10-CM | POA: Diagnosis present

## 2024-04-05 DIAGNOSIS — L0390 Cellulitis, unspecified: Secondary | ICD-10-CM | POA: Diagnosis present

## 2024-04-05 DIAGNOSIS — D539 Nutritional anemia, unspecified: Secondary | ICD-10-CM | POA: Insufficient documentation

## 2024-04-05 DIAGNOSIS — F200 Paranoid schizophrenia: Secondary | ICD-10-CM | POA: Diagnosis present

## 2024-04-05 DIAGNOSIS — M6598 Unspecified synovitis and tenosynovitis, other site: Secondary | ICD-10-CM | POA: Diagnosis present

## 2024-04-05 DIAGNOSIS — Z8614 Personal history of Methicillin resistant Staphylococcus aureus infection: Secondary | ICD-10-CM

## 2024-04-05 DIAGNOSIS — L03011 Cellulitis of right finger: Secondary | ICD-10-CM | POA: Diagnosis present

## 2024-04-05 DIAGNOSIS — M869 Osteomyelitis, unspecified: Secondary | ICD-10-CM | POA: Diagnosis not present

## 2024-04-05 DIAGNOSIS — F319 Bipolar disorder, unspecified: Secondary | ICD-10-CM | POA: Diagnosis present

## 2024-04-05 DIAGNOSIS — L03113 Cellulitis of right upper limb: Secondary | ICD-10-CM | POA: Diagnosis present

## 2024-04-05 DIAGNOSIS — I10 Essential (primary) hypertension: Secondary | ICD-10-CM | POA: Diagnosis present

## 2024-04-05 DIAGNOSIS — Z91199 Patient's noncompliance with other medical treatment and regimen due to unspecified reason: Secondary | ICD-10-CM

## 2024-04-05 DIAGNOSIS — Z66 Do not resuscitate: Secondary | ICD-10-CM | POA: Diagnosis present

## 2024-04-05 LAB — CBC WITH DIFFERENTIAL/PLATELET
Abs Immature Granulocytes: 0.01 10*3/uL (ref 0.00–0.07)
Basophils Absolute: 0 10*3/uL (ref 0.0–0.1)
Basophils Relative: 0 %
Eosinophils Absolute: 0.3 10*3/uL (ref 0.0–0.5)
Eosinophils Relative: 6 %
HCT: 32.2 % — ABNORMAL LOW (ref 39.0–52.0)
Hemoglobin: 10.3 g/dL — ABNORMAL LOW (ref 13.0–17.0)
Immature Granulocytes: 0 %
Lymphocytes Relative: 30 %
Lymphs Abs: 1.6 10*3/uL (ref 0.7–4.0)
MCH: 32.6 pg (ref 26.0–34.0)
MCHC: 32 g/dL (ref 30.0–36.0)
MCV: 101.9 fL — ABNORMAL HIGH (ref 80.0–100.0)
Monocytes Absolute: 0.8 10*3/uL (ref 0.1–1.0)
Monocytes Relative: 16 %
Neutro Abs: 2.6 10*3/uL (ref 1.7–7.7)
Neutrophils Relative %: 48 %
Platelets: 316 10*3/uL (ref 150–400)
RBC: 3.16 MIL/uL — ABNORMAL LOW (ref 4.22–5.81)
RDW: 14.8 % (ref 11.5–15.5)
WBC: 5.4 10*3/uL (ref 4.0–10.5)
nRBC: 0 % (ref 0.0–0.2)

## 2024-04-05 LAB — COMPREHENSIVE METABOLIC PANEL WITH GFR
ALT: 29 U/L (ref 0–44)
AST: 31 U/L (ref 15–41)
Albumin: 3.6 g/dL (ref 3.5–5.0)
Alkaline Phosphatase: 72 U/L (ref 38–126)
Anion gap: 8 (ref 5–15)
BUN: 15 mg/dL (ref 6–20)
CO2: 21 mmol/L — ABNORMAL LOW (ref 22–32)
Calcium: 8.6 mg/dL — ABNORMAL LOW (ref 8.9–10.3)
Chloride: 103 mmol/L (ref 98–111)
Creatinine, Ser: 0.71 mg/dL (ref 0.61–1.24)
GFR, Estimated: >60 mL/min (ref >60)
Glucose, Bld: 105 mg/dL — ABNORMAL HIGH (ref 70–99)
Potassium: 3.4 mmol/L — ABNORMAL LOW (ref 3.5–5.1)
Sodium: 132 mmol/L — ABNORMAL LOW (ref 135–145)
Total Bilirubin: 0.5 mg/dL (ref 0.0–1.2)
Total Protein: 8.3 g/dL — ABNORMAL HIGH (ref 6.5–8.1)

## 2024-04-05 LAB — C-REACTIVE PROTEIN: CRP: 1.1 mg/dL — ABNORMAL HIGH (ref <1.0)

## 2024-04-05 LAB — LACTIC ACID, PLASMA: Lactic Acid, Venous: 1.2 mmol/L (ref 0.5–1.9)

## 2024-04-05 MED ORDER — LACTATED RINGERS IV SOLN: INTRAVENOUS | Status: AC

## 2024-04-05 MED ORDER — HYDROMORPHONE HCL 1 MG/ML IJ SOLN
0.2000 mg | INTRAMUSCULAR | Status: AC | PRN
  Administered 2024-04-06 – 2024-04-19 (×9): 0.2 mg via INTRAVENOUS
  Filled 2024-04-05 (×11): qty 0.5

## 2024-04-05 MED ORDER — BICTEGRAVIR-EMTRICITAB-TENOFOV 50-200-25 MG PO TABS
1.0000 | ORAL_TABLET | Freq: Every day | ORAL | Status: DC
  Administered 2024-04-06 – 2024-04-24 (×19): 1 via ORAL
  Filled 2024-04-05 (×20): qty 1

## 2024-04-05 MED ORDER — OLANZAPINE 5 MG PO TBDP
15.0000 mg | ORAL_TABLET | Freq: Two times a day (BID) | ORAL | Status: DC
Start: 2024-04-06 — End: 2024-04-24
  Administered 2024-04-06 – 2024-04-24 (×37): 15 mg via ORAL
  Filled 2024-04-05 (×39): qty 3

## 2024-04-05 MED ORDER — POTASSIUM CHLORIDE 20 MEQ PO PACK
40.0000 meq | PACK | Freq: Once | ORAL | Status: AC
  Administered 2024-04-05: 40 meq via ORAL
  Filled 2024-04-05: qty 2

## 2024-04-05 MED ORDER — HYDROMORPHONE HCL 1 MG/ML IJ SOLN
0.2000 mg | Freq: Once | INTRAMUSCULAR | Status: AC
  Administered 2024-04-05: 0.2 mg via INTRAVENOUS
  Filled 2024-04-05: qty 0.5

## 2024-04-05 NOTE — H&P (Incomplete)
 History and Physical    Mark Galloway QMV:[id number] DOB: 1986-08-13 DOA: 04/05/2024  Patient coming from: ***  Chief Complaint: ***  HPI: Mark Galloway is a 38 y.o. male with ***   ED Course: ***  Review of Systems: As per HPI, rest all negative.   Past Medical History:  Diagnosis Date   Bipolar 1 disorder ([name])    HIV infection ([name])    Schizophrenia ([name])    Shingles     Past Surgical History:  Procedure Laterality Date   I & D EXTREMITY Right 02/22/2024   Procedure: IRRIGATION AND DEBRIDEMENT EXTREMITY;  Surgeon: Betha Loa, MD;  Location: [facility] OR;  Service: Orthopedics;  Laterality: Right;   IRRIGATION AND DEBRIDEMENT FOOT Left      reports that he has been smoking cigars and cigarettes. He has never used smokeless tobacco. He reports current alcohol use. He reports that he does not use drugs.  Allergies  Allergen Reactions   Peanut Oil Hives   Peanut-Containing Drug Products     History reviewed. No pertinent family history.  Prior to Admission medications   Medication Sig Start Date End Date Taking? Authorizing Provider  bictegravir-emtricitabine-tenofovir AF (BIKTARVY) 50-200-25 MG TABS tablet Take 1 tablet by mouth daily. 04/01/24   Verner Chol, MD  hydrALAZINE (APRESOLINE) 50 MG tablet Take 1 tablet (50 mg total) by mouth every 2 (two) hours as needed (for SBP>160). 03/31/24   Verner Chol, MD  melatonin 5 MG TABS Take 1 tablet (5 mg total) by mouth at bedtime. 03/31/24   Verner Chol, MD  nicotine (NICODERM CQ - DOSED IN MG/24 HOURS) 21 mg/24hr patch Place 1 patch (21 mg total) onto the skin daily. 04/01/24   Verner Chol, MD  OLANZapine zydis (ZYPREXA) 15 MG disintegrating tablet Take 1 tablet (15 mg total) by mouth 2 (two) times daily. 03/31/24   Verner Chol, MD  propranolol (INDERAL) 10 MG tablet Take 1 tablet (10 mg total) by mouth 2 (two) times daily. 03/31/24   Verner Chol, MD  sulfamethoxazole-trimethoprim (BACTRIM DS) 800-160 MG tablet Take 1  tablet by mouth 2 (two) times daily. 03/31/24   Verner Chol, MD  sulfamethoxazole-trimethoprim (BACTRIM DS) 800-160 MG tablet Take 2 tablets by mouth 2 (two) times daily for 14 days. 03/31/24 04/14/24  Verner Chol, MD    Physical Exam: Constitutional: *** Vitals:   04/05/24 1720 04/05/24 1910 04/05/24 1935 04/05/24 2033  BP:  96/76 112/75 119/72  Pulse:  91 98 83  Resp:  17 17 18   Temp:   98 F (36.7 C) 97.8 F (36.6 C)  TempSrc:      SpO2:  99% 99% 99%  Weight: 74.8 kg     Height: 5\' 9"  (1.753 m)      Eyes: *** ENMT: *** Neck: *** Respiratory: *** Cardiovascular: *** Abdomen: *** Musculoskeletal: *** Skin: *** Neurologic:*** Psychiatric: ***   Labs on Admission: I have personally reviewed following labs and imaging studies  CBC: Recent Labs  Lab 04/05/24 1826  WBC 5.4  NEUTROABS 2.6  HGB 10.3*  HCT 32.2*  MCV 101.9*  PLT 316   Basic Metabolic Panel: Recent Labs  Lab 04/05/24 1826  NA 132*  K 3.4*  CL 103  CO2 21*  GLUCOSE 105*  BUN 15  CREATININE 0.71  CALCIUM 8.6*   GFR: Estimated Creatinine Clearance: 126.4 mL/min (by C-G formula based on SCr of 0.71 mg/dL). Liver Function Tests: Recent Labs  Lab 04/05/24 1826  AST 31  ALT 29  ALKPHOS  72  BILITOT 0.5  PROT 8.3*  ALBUMIN 3.6   No results for input(s): "LIPASE", "AMYLASE" in the last 168 hours. No results for input(s): "AMMONIA" in the last 168 hours. Coagulation Profile: No results for input(s): "INR", "PROTIME" in the last 168 hours. Cardiac Enzymes: No results for input(s): "CKTOTAL", "CKMB", "CKMBINDEX", "TROPONINI" in the last 168 hours. BNP (last 3 results) No results for input(s): "PROBNP" in the last 8760 hours. HbA1C: No results for input(s): "HGBA1C" in the last 72 hours. CBG: No results for input(s): "GLUCAP" in the last 168 hours. Lipid Profile: No results for input(s): "CHOL", "HDL", "LDLCALC", "TRIG", "CHOLHDL", "LDLDIRECT" in the last 72 hours. Thyroid Function  Tests: No results for input(s): "TSH", "T4TOTAL", "FREET4", "T3FREE", "THYROIDAB" in the last 72 hours. Anemia Panel: No results for input(s): "VITAMINB12", "FOLATE", "FERRITIN", "TIBC", "IRON", "RETICCTPCT" in the last 72 hours. Urine analysis:    Component Value Date/Time   COLORURINE YELLOW 04/17/2021 1556   APPEARANCEUR CLEAR 04/17/2021 1556   LABSPEC 1.023 04/17/2021 1556   PHURINE 8.0 04/17/2021 1556   GLUCOSEU NEGATIVE 04/17/2021 1556   HGBUR NEGATIVE 04/17/2021 1556   BILIRUBINUR NEGATIVE 04/17/2021 1556   KETONESUR NEGATIVE 04/17/2021 1556   PROTEINUR TRACE (A) 04/17/2021 1556   NITRITE NEGATIVE 04/17/2021 1556   LEUKOCYTESUR NEGATIVE 04/17/2021 1556   Sepsis Labs: @LABRCNTIP (procalcitonin:4,lacticidven:4) ) Recent Results (from the past 240 hours)  Blood culture (routine x 2)     Status: None (Preliminary result)   Collection Time: 04/05/24  9:10 PM   Specimen: BLOOD RIGHT ARM  Result Value Ref Range Status   Specimen Description BLOOD RIGHT ARM  Final   Special Requests   Final    BOTTLES DRAWN AEROBIC AND ANAEROBIC Blood Culture adequate volume Performed at [facility], [address] [place], [place] [address]    Culture PENDING  Incomplete   Report Status PENDING  Incomplete     Radiological Exams on Admission: DG Finger Little Right Result Date: 04/05/2024 CLINICAL DATA:  Nonhealing wound on anterior right little finger. History is spider bite. EXAM: RIGHT LITTLE FINGER 2+V COMPARISON:  03/17/2024. FINDINGS: Periosteal elevation is noted along the mid and proximal phalanx of the fifth digit with a associated bony erosions. Lucency is noted in the head of the middle phalanx on image 2, possible nondisplaced fracture. There is diffuse soft tissue swelling of the fifth digit. No radiopaque foreign body is seen. IMPRESSION: Periosteal elevation involving in the proximal and mid phalanx of the fifth digit, concerning for osteomyelitis. There is a linear lucency  in the head of the middle phalanx on image 2, possible nondisplaced fracture. Electronically Signed   By: Thornell Sartorius M.D.   On: 04/05/2024 18:40    EKG: Independently reviewed. ***  Assessment/Plan Principal Problem:   Cellulitis of hand Active Problems:   AIDS ([name])   Paranoid schizophrenia ([name])   Hyponatremia   Bipolar 1 disorder ([name])   Cellulitis   Macrocytic anemia    ***   DVT prophylaxis: *** Code Status: ***  Family Communication: ***  Disposition Plan: ***  Consults called: ***  Admission status: ***

## 2024-04-05 NOTE — Consult Note (Signed)
 Orthopedic Surgery Consult Note  Assessment: Patient is a 38 y.o. 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 Merlyn Lot or one of his partners in the morning since this is a post-operative patient. There are several photos in the media tab that show a swollen and erythematous finger. It does not appear much different from this photos. There are also notes documenting persistent drainage when he was at [facility] from Dr. Hyacinth Meeker (1 month after his surgery) and his periosteal reaction suggests chronic as well. I do not feel that urgent overnight surgery is needed given the chronicity. Okay for diet. NPO at midnight. Further recommendations to follow once discussion had with Dr. Merlyn Lot.     ___________________________________________________________________________   Reason for consult: right small finger infection  History:  Patient is a 38 y.o. male with a history of right small finger I&D with Dr. Merlyn Lot on 02/20/2024 whose post-operative course was complicated by non-compliance with antibiotics and persistent drainage. Dr. Hyacinth Meeker saw the patient on 03/21/2024 and noted persistent drainage. Soaks and antibiotics were started at that time. He was on antibiotics while admitted to [facility] and was recently discharged on 03/31/2024. He then presented to [facility] with right small finger pain. He was transferred to  [facility] and admitted to medicine. He is having some pain over the his small finger particularly near the MCP joint. He says that there was been drainage from his wound over the middle and proximal phalanx. He has not noted pain over the middle or distal phalanx of the small finger. He said his small finger has been swollen since his index surgery. He has decreased sensation over the volar surface of the small finger fingertip. No other numbness or paresthesias. He states that his  finger has been stuck in a flexed position for some time now and he has been unable to extend it.   Past Medical History:  Diagnosis Date   Bipolar 1 disorder ([name])    HIV infection ([name])    Schizophrenia ([name])    Shingles     Allergies: NKDA   Past Surgical History:  Procedure Laterality Date   I & D EXTREMITY Right 02/22/2024   Procedure: IRRIGATION AND DEBRIDEMENT EXTREMITY;  Surgeon: Betha Loa, MD;  Location: [facility] OR;  Service: Orthopedics;  Laterality: Right;   IRRIGATION AND DEBRIDEMENT FOOT Left     Social History   Tobacco Use   Smoking status: Some Days    Types: Cigars, Cigarettes   Smokeless tobacco: Never   Tobacco comments:    2 per week  Substance Use Topics   Alcohol use: Yes    Comment: 3-4 per week    Physical Exam:  BMI of 24.4  General: no acute distress, appears stated age Neurologic: alert, answering questions appropriately, following commands Cardiovascular: regular rate, no cyanosis Respiratory: unlabored breathing on room air, symmetric chest rise Psychiatric: appropriate affect, normal cadence to speech  MSK:   -Right hand exam  TTP over the MCP joint, no other tenderness to palpation over the hand or finger  Swelling seen volarly and dorsally at the small finger from the distal phalanx to the MCP joint, no other swelling sween  Chronic wound with granulation tissue in the base over the middle and proximal phalanx, no active or expressible drainage, dry blood appears around the wound  Flexion contractures at the DIP and POP joints, unable  to passively extend   No tenderness to palpation over the flexor sheath in the middle or distal phlanx  AIN/PIN/IO intact  Palpable radial pulse  Sensation intact to light touch in median/ulnar/radial/axillary nerve distributions (decreased midline at the ventral surface of the small finger fingertip)  Hand warm and well perfused  Photos in the media tab from 03/21/2024 and 03/30/2024 were reviewed  showing swelling around the proximal phalanx and middle phalanx. Wound seen over the proximal and middle phalanx. Erythema seen around the MCP joint particularly on the 03/21/2024 photo.  Labs from 4/06 show CRP of 1.1, lactate of 1.2, WBC of 5.4  Imaging: XRs of the right small finger from 04/05/2024 were independently reviewed and interpreted, showing periosteal reaction over the middle phalanx. Soft tissue swelling seen around the middle and proximal phalanx. No fracture or dislocation seen.    Patient name: Mark Galloway Patient MRN: [id number] Date: 04/05/24

## 2024-04-05 NOTE — Plan of Care (Signed)

## 2024-04-05 NOTE — ED Provider Notes (Signed)
 Mark Galloway EMERGENCY DEPARTMENT AT [facility] Provider Note   CSN: [id number] Arrival date & time: 04/05/24  1710     History  Chief Complaint  Patient presents with   Hand Pain    Mark Galloway is a 38 y.o. male, hx of HIV, schizophrenia, who presents to the ED 2/2 to right Mark Galloway finger infection, this been going on for several months.  He states it happened after a spider bite on 2/2, and states that has been more painful, for the last few days after being discharged from the hospital.  Has not been taking his HIV medication or antibiotics.  States he cannot bend his finger now.  It is extremely painful.  Home Medications Prior to Admission medications   Medication Sig Start Date End Date Taking? Authorizing Provider  bictegravir-emtricitabine-tenofovir AF (BIKTARVY) 50-200-25 MG TABS tablet Take 1 tablet by mouth daily. 04/01/24   Verner Chol, MD  hydrALAZINE (APRESOLINE) 50 MG tablet Take 1 tablet (50 mg total) by mouth every 2 (two) hours as needed (for SBP>160). 03/31/24   Verner Chol, MD  melatonin 5 MG TABS Take 1 tablet (5 mg total) by mouth at bedtime. 03/31/24   Verner Chol, MD  nicotine (NICODERM CQ - DOSED IN MG/24 HOURS) 21 mg/24hr patch Place 1 patch (21 mg total) onto the skin daily. 04/01/24   Verner Chol, MD  OLANZapine zydis (ZYPREXA) 15 MG disintegrating tablet Take 1 tablet (15 mg total) by mouth 2 (two) times daily. 03/31/24   Verner Chol, MD  propranolol (INDERAL) 10 MG tablet Take 1 tablet (10 mg total) by mouth 2 (two) times daily. 03/31/24   Verner Chol, MD  sulfamethoxazole-trimethoprim (BACTRIM DS) 800-160 MG tablet Take 1 tablet by mouth 2 (two) times daily. 03/31/24   Verner Chol, MD  sulfamethoxazole-trimethoprim (BACTRIM DS) 800-160 MG tablet Take 2 tablets by mouth 2 (two) times daily for 14 days. 03/31/24 04/14/24  Verner Chol, MD      Allergies    Peanut oil and Peanut-containing drug products    Review of Systems   Review  of Systems  Constitutional:  Negative for fever.  Skin:  Positive for wound.    Physical Exam Updated Vital Signs BP 123/74 (BP Location: Left Arm)   Pulse 96   Temp 98.7 F (37.1 C) (Oral)   Resp 16   Ht 5\' 9"  (1.753 m)   Wt 74.8 kg   SpO2 100%   BMI 24.37 kg/m  Physical Exam Vitals and nursing note reviewed.  Constitutional:      General: He is not in acute distress.    Appearance: He is well-developed.  HENT:     Head: Normocephalic and atraumatic.  Eyes:     General:        Right eye: No discharge.        Left eye: No discharge.     Conjunctiva/sclera: Conjunctivae normal.  Pulmonary:     Effort: No respiratory distress.  Musculoskeletal:     Comments: Right hand: TTP of PIP and MCP of digit 5. Flexed, unable to extend completely. Radial pulses present. Grip strength intact. Able to flex, extend, ulnar and radial deviate wrist. Two point discrimination intact. Normal thumb opposition. Intact ROM for all MCPs, PIPs, and DIPs except 5th digit as noted.  No snuffbox ttp. No sensory deficits. Capillary refill <2sec   Skin:    Comments: +open wound with overlying erythema of 5th digit of right hand  Neurological:     Mental  Status: He is alert.     Comments: Clear speech.   Psychiatric:        Behavior: Behavior normal.        Thought Content: Thought content normal.     ED Results / Procedures / Treatments   Labs (all labs ordered are listed, but only abnormal results are displayed) Labs Reviewed  CBC WITH DIFFERENTIAL/PLATELET - Abnormal; Notable for the following components:      Result Value   RBC 3.16 (*)    Hemoglobin 10.3 (*)    HCT 32.2 (*)    MCV 101.9 (*)    All other components within normal limits  COMPREHENSIVE METABOLIC PANEL WITH GFR - Abnormal; Notable for the following components:   Sodium 132 (*)    Potassium 3.4 (*)    CO2 21 (*)    Glucose, Bld 105 (*)    Calcium 8.6 (*)    Total Protein 8.3 (*)    All other components within normal  limits  CULTURE, BLOOD (ROUTINE X 2)  CULTURE, BLOOD (ROUTINE X 2)  LACTIC ACID, PLASMA  LACTIC ACID, PLASMA  C-REACTIVE PROTEIN  SEDIMENTATION RATE    EKG None  Radiology DG Finger Little Right Result Date: 04/05/2024 CLINICAL DATA:  Nonhealing wound on anterior right little finger. History is spider bite. EXAM: RIGHT LITTLE FINGER 2+V COMPARISON:  03/17/2024. FINDINGS: Periosteal elevation is noted along the mid and proximal phalanx of the fifth digit with a associated bony erosions. Lucency is noted in the head of the middle phalanx on image 2, possible nondisplaced fracture. There is diffuse soft tissue swelling of the fifth digit. No radiopaque foreign body is seen. IMPRESSION: Periosteal elevation involving in the proximal and mid phalanx of the fifth digit, concerning for osteomyelitis. There is a linear lucency in the head of the middle phalanx on image 2, possible nondisplaced fracture. Electronically Signed   By: Thornell Sartorius M.D.   On: 04/05/2024 18:40    Procedures Procedures    Medications Ordered in ED Medications - No data to display  ED Course/ Medical Decision Making/ A&P                                 Medical Decision Making Patient is a 38 year old male, history of HIV, that is uncontrolled, with previous diagnosis of AIDS, here for Mark Galloway finger pain, this been going on for the last few days.  He states that been going on for the last couple months, but got worse in the last couple days.  Was previously admitted, for finger infection, foot infection, last admission ended on 3/31.  Has had an irrigation and debridement of his right Mark Galloway finger by Dr. Merlyn Lot in February, repeat hospitalization in March and now is here again. Finger is swollen and contracted here. Unable to passively extend. Will obtain xray and blood work for further management.  Also reached out to Dr. Christell Constant, in regards to the concern, for flexor tenosynovitis, given inability to completely extend  finger, and swelling.  He stated he will likely take the patient to the OR tonight, and to have them admitted to the hospitalist, and sent to [facility] for further management.  Keep patient n.p.o.  Amount and/or Complexity of Data Reviewed Labs: ordered.    Details: Blood work unremarkable Radiology: ordered.    Details: X-ray shows findings concerning of osteomyelitis, also possible nondisplaced fracture of the middle phalanx Discussion of management  or test interpretation with external provider(s): Discussed with patient, he will need to be admitted, given concerns for osteomyelitis, and flexor tenosynovitis, to be transferred to [facility], per Dr. Kathi Der, work recommendation.  And likely taken to the OR tonight.  Will be kept n.p.o., admitted to Dr.Kakrakandy for further management.  Patient is afebrile, nontachycardic, but is not compliant with his Biktarvy, or antibiotics.  Makes him at an increased risk, for infection, and further deterioration  Risk Decision regarding hospitalization.    Final Clinical Impression(s) / ED Diagnoses Final diagnoses:  Tenosynovitis of finger  Osteomyelitis of right hand, unspecified type [facility])    Rx / DC Orders ED Discharge Orders     None         Pete Pelt, PA 04/05/24 Arman Bogus, MD 04/05/24 2116

## 2024-04-05 NOTE — ED Triage Notes (Signed)
 Pt BIB [facility]. Pt presents with pain to pinky finger. Concerned for infection. Wound is reportedly from a spider bit on 2/2. He has had previous visits to the ED for the same.   EMS Vitals  118/86 HR 90 SpO2 99% on RA

## 2024-04-05 NOTE — ED Notes (Signed)
[facility] called 

## 2024-04-06 ENCOUNTER — Observation Stay (HOSPITAL_COMMUNITY): Payer: Self-pay

## 2024-04-06 DIAGNOSIS — M65949 Unspecified synovitis and tenosynovitis, unspecified hand: Secondary | ICD-10-CM

## 2024-04-06 DIAGNOSIS — M86141 Other acute osteomyelitis, right hand: Secondary | ICD-10-CM | POA: Diagnosis not present

## 2024-04-06 DIAGNOSIS — L03113 Cellulitis of right upper limb: Secondary | ICD-10-CM

## 2024-04-06 DIAGNOSIS — B20 Human immunodeficiency virus [HIV] disease: Secondary | ICD-10-CM | POA: Diagnosis not present

## 2024-04-06 DIAGNOSIS — L03119 Cellulitis of unspecified part of limb: Secondary | ICD-10-CM | POA: Diagnosis not present

## 2024-04-06 DIAGNOSIS — Z91199 Patient's noncompliance with other medical treatment and regimen due to unspecified reason: Secondary | ICD-10-CM

## 2024-04-06 LAB — CBC
HCT: 30.7 % — ABNORMAL LOW (ref 39.0–52.0)
Hemoglobin: 10.2 g/dL — ABNORMAL LOW (ref 13.0–17.0)
MCH: 33 pg (ref 26.0–34.0)
MCHC: 33.2 g/dL (ref 30.0–36.0)
MCV: 99.4 fL (ref 80.0–100.0)
Platelets: 309 10*3/uL (ref 150–400)
RBC: 3.09 MIL/uL — ABNORMAL LOW (ref 4.22–5.81)
RDW: 14.6 % (ref 11.5–15.5)
WBC: 6.1 10*3/uL (ref 4.0–10.5)
nRBC: 0 % (ref 0.0–0.2)

## 2024-04-06 LAB — RETICULOCYTES
Immature Retic Fract: 15.4 % (ref 2.3–15.9)
RBC.: 3.14 MIL/uL — ABNORMAL LOW (ref 4.22–5.81)
Retic Count, Absolute: 81.3 10*3/uL (ref 19.0–186.0)
Retic Ct Pct: 2.6 % (ref 0.4–3.1)

## 2024-04-06 LAB — BASIC METABOLIC PANEL WITH GFR
Anion gap: 5 (ref 5–15)
BUN: 9 mg/dL (ref 6–20)
CO2: 23 mmol/L (ref 22–32)
Calcium: 8.5 mg/dL — ABNORMAL LOW (ref 8.9–10.3)
Chloride: 107 mmol/L (ref 98–111)
Creatinine, Ser: 0.63 mg/dL (ref 0.61–1.24)
GFR, Estimated: >60 mL/min (ref >60)
Glucose, Bld: 95 mg/dL (ref 70–99)
Potassium: 3.7 mmol/L (ref 3.5–5.1)
Sodium: 135 mmol/L (ref 135–145)

## 2024-04-06 LAB — FOLATE: Folate: 15.1 ng/mL (ref >5.9)

## 2024-04-06 LAB — IRON AND TIBC
Iron: 22 ug/dL — ABNORMAL LOW (ref 45–182)
Saturation Ratios: 6 % — ABNORMAL LOW (ref 17.9–39.5)
TIBC: 350 ug/dL (ref 250–450)
UIBC: 328 ug/dL

## 2024-04-06 LAB — FERRITIN: Ferritin: 6 ng/mL — ABNORMAL LOW (ref 24–336)

## 2024-04-06 LAB — VITAMIN B12: Vitamin B-12: 190 pg/mL (ref 180–914)

## 2024-04-06 MED ORDER — HYDRALAZINE HCL 20 MG/ML IJ SOLN: 5.0000 mg | INTRAMUSCULAR | Status: DC | PRN

## 2024-04-06 MED ORDER — VANCOMYCIN HCL 1500 MG/300ML IV SOLN
1500.0000 mg | Freq: Two times a day (BID) | INTRAVENOUS | Status: DC
  Administered 2024-04-06 – 2024-04-09 (×6): 1500 mg via INTRAVENOUS
  Filled 2024-04-06 (×7): qty 300

## 2024-04-06 MED ORDER — SULFAMETHOXAZOLE-TRIMETHOPRIM 800-160 MG PO TABS
1.0000 | ORAL_TABLET | Freq: Every day | ORAL | Status: DC
Start: 2024-04-07 — End: 2024-04-24
  Administered 2024-04-07 – 2024-04-24 (×18): 1 via ORAL
  Filled 2024-04-06 (×18): qty 1

## 2024-04-06 MED ORDER — SODIUM CHLORIDE 0.9 % IV SOLN
1.0000 g | INTRAVENOUS | Status: DC
  Administered 2024-04-06 – 2024-04-21 (×16): 1 g via INTRAVENOUS
  Filled 2024-04-06 (×18): qty 1000

## 2024-04-06 MED ORDER — SODIUM CHLORIDE 0.9 % IV SOLN
2.0000 g | Freq: Three times a day (TID) | INTRAVENOUS | Status: DC
  Administered 2024-04-06: 2 g via INTRAVENOUS
  Filled 2024-04-06 (×2): qty 12.5

## 2024-04-06 MED ORDER — VANCOMYCIN HCL 1500 MG/300ML IV SOLN
1500.0000 mg | Freq: Once | INTRAVENOUS | Status: AC
  Administered 2024-04-06: 1500 mg via INTRAVENOUS
  Filled 2024-04-06: qty 300

## 2024-04-06 NOTE — Consult Note (Signed)
 [facility]    Date of Admission:  04/05/2024     Total days of antibiotics 2               Reason for Consult: Finger infection/HIV/AIDS  Referring Provider: Dr. Kirke Corin Primary Care Provider: Veryl Speak, [name]   ASSESSMENT:  Mark Galloway is a 38 year old African-American male with advanced HIV disease and less than optimal adherence to medication regimens challenged by his mental health with recent debridement of right fifth finger abscess presenting with worsening pain and swelling.  Previous cultures were positive for MRSA and ESBL E. coli.  No emergent indication for surgery and awaiting evaluation by Dr. Merlyn Lot.  Discussed importance of taking medications as prescribed to reduce risk of further complications and disease progression.  Continue current dose of Biktarvy for HIV/AIDS.  Continue ertapenem and vancomycin for right finger infection.  Will restart Bactrim for now for OI prophylaxis.  Continue standard/universal precautions.  Remaining medical and supportive care per primary team.  PLAN:  Continue current dose of ertapenem and vancomycin. Therapeutic drug monitoring of renal function and vancomycin levels. Postoperative wound care and additional surgical interventions per orthopedics Continue Biktarvy supplemented with Bactrim for OI prophylaxis for advanced HIV/AIDS. Standard/universal precautions. Remaining medical and supportive care per primary team.   Principal Problem:   Cellulitis of hand Active Problems:   Bipolar 1 disorder ([name])   AIDS ([name])   Cellulitis   Paranoid schizophrenia ([name])   Hyponatremia   Osteomyelitis of right hand ([name])   Macrocytic anemia    bictegravir-emtricitabine-tenofovir AF  1 tablet Oral Daily   OLANZapine zydis  15 mg Oral BID   [START ON 04/07/2024] sulfamethoxazole-trimethoprim  1 tablet Oral Daily     HPI: Mark Galloway is a 38 y.o. male with previous medical history of HIV/AIDS,  schizophrenia, bipolar 1 disorder, and recent admission for right fifth finger infection presenting to the hospital with increased pain to his fifth right finger and concern for infection.  Mark Galloway is well-known to the infectious disease service and was recently hospitalized for abscess of the right fifth finger status postdebridement on 02/22/2024 with cultures positive for MRSA and treated with Bactrim.  He had less than optimal adherence to Bactrim secondary to homelessness.  Cultures were found to have MRSA plus ESBL E. coli.  MRI showed osteomyelitis involving the middle phalanx and base of the distal phalanx of the small finger.  Was treated with linezolid upon discharge.  For HIV was found to have viral load of 80 with CD4 count of 163.  Continued on Biktarvy.  Also found to have syphilis which was treated with most recent titer 1: 32 down from 1: 256.    Mark Galloway now presents with 3-day history of right fifth finger swelling and pain.  Less than optimal adherence to his medications since being discharged and missed follow-up with ID clinic.  Afebrile with no leukocytosis.  X-ray right little finger showing periosteal elevation in the proximal and mid phalanx of the fifth digit concerning for osteomyelitis.  Chest x-ray with no active disease.  Orthopedics evaluated with no emergent need for surgical intervention and to be followed by Dr. Merlyn Lot.  Started on ertapenem and vancomycin.  ID has been asked to make antibiotic recommendations.  Review of Systems: Review of Systems  Constitutional:  Negative for chills, fever and weight loss.  Respiratory:  Negative for cough, shortness of breath and wheezing.   Cardiovascular:  Negative for  chest pain and leg swelling.  Gastrointestinal:  Negative for abdominal pain, constipation, diarrhea, nausea and vomiting.  Musculoskeletal:        Right fifth finger swelling  Skin:  Negative for rash.     Past Medical History:  Diagnosis Date   Bipolar  1 disorder ([name])    HIV infection ([name])    Schizophrenia ([name])    Shingles     Social History   Tobacco Use   Smoking status: Some Days    Types: Cigars, Cigarettes   Smokeless tobacco: Never   Tobacco comments:    2 per week  Vaping Use   Vaping status: Never Used  Substance Use Topics   Alcohol use: Yes    Comment: 3-4 per week   Drug use: Never    History reviewed. No pertinent family history.  Allergies  Allergen Reactions   Peanut Oil Hives   Peanut-Containing Drug Products     OBJECTIVE: Blood pressure 102/60, pulse 79, temperature 97.8 F (36.6 C), resp. rate 16, height 5\' 9"  (1.753 m), weight 74.8 kg, SpO2 98%.  Physical Exam Constitutional:      General: He is not in acute distress.    Appearance: He is well-developed.  Cardiovascular:     Rate and Rhythm: Normal rate and regular rhythm.     Heart sounds: Normal heart sounds.  Pulmonary:     Effort: Pulmonary effort is normal.     Breath sounds: Normal breath sounds.  Skin:    General: Skin is warm and dry.  Neurological:     Mental Status: He is alert and oriented to person, place, and time.  Psychiatric:        Mood and Affect: Mood normal.     Lab Results Lab Results  Component Value Date   WBC 6.1 04/06/2024   HGB 10.2 (L) 04/06/2024   HCT 30.7 (L) 04/06/2024   MCV 99.4 04/06/2024   PLT 309 04/06/2024    Lab Results  Component Value Date   CREATININE 0.63 04/06/2024   BUN 9 04/06/2024   NA 135 04/06/2024   K 3.7 04/06/2024   CL 107 04/06/2024   CO2 23 04/06/2024    Lab Results  Component Value Date   ALT 29 04/05/2024   AST 31 04/05/2024   ALKPHOS 72 04/05/2024   BILITOT 0.5 04/05/2024     Microbiology: Recent Results (from the past 240 hours)  Blood culture (routine x 2)     Status: None (Preliminary result)   Collection Time: 04/05/24  6:26 PM   Specimen: BLOOD  Result Value Ref Range Status   Specimen Description   Final    BLOOD SITE NOT SPECIFIED Performed at  [facility], [address] [place], [place] [address]    Special Requests   Final    BOTTLES DRAWN AEROBIC AND ANAEROBIC Blood Culture results may not be optimal due to an inadequate volume of blood received in culture bottles Performed at [facility], [address] [place], [place] [address]    Culture   Final    NO GROWTH < 12 HOURS Performed at [facility], [address] [place], [place] [address]    Report Status PENDING  Incomplete  Blood culture (routine x 2)     Status: None (Preliminary result)   Collection Time: 04/05/24  9:10 PM   Specimen: BLOOD RIGHT ARM  Result Value Ref Range Status   Specimen Description BLOOD RIGHT ARM  Final   Special Requests   Final    BOTTLES DRAWN AEROBIC AND ANAEROBIC Blood Culture adequate volume   Culture   Final    NO GROWTH < 12 HOURS Performed at [facility], [address], [place], [place] [address]    Report Status PENDING  Incomplete    I have personally spent 35 minutes involved in face-to-face and non-face-to-face activities for this patient on the day of the visit. Professional time spent includes the following activities: Preparing to see the patient (review of tests), Obtaining and/or reviewing separately obtained history (admission/discharge record), Performing a medically appropriate examination and/or evaluation , Ordering medications/tests/procedures, referring and communicating with other health care professionals, Documenting clinical information in the EMR, Independently interpreting results (not separately reported), Communicating results to the patient/family/caregiver, Counseling and educating the patient/family/caregiver and Care coordination (not separately reported).    [name], NP [facility]  04/06/2024  3:40 PM

## 2024-04-06 NOTE — Progress Notes (Signed)
 Progress Note   Patient: Mark Galloway ZOX:[id number] DOB: 01-13-1986 DOA: 04/05/2024     0 DOS: the patient was seen and examined on 04/06/2024   Brief hospital course: 38 year old man with PMH of HIV on ART, bipolar disorder, recent right little finger infection who presented with worsening pain and swelling of the right little finger.  Assessment and Plan:  Cellulitis of the right hand lateral finger with possible osteomyelitis  Patient recently treated for infection of right little finger. X-rays on presentation concerning for osteomyelitis. Patient has been seen by orthopedic surgery, who stated no plan for operative intervention at this time. Recommended ID consult. Infectious diseases been consulted.  Input appreciated. -Ertapenem, vancomycin. -Will consider MRI. Marland Kitchen HIV/AIDS  Last CD4 count was 163 on March 15, 2024.   Last viral load was 80.   -Continue home Biktarvy. -Started Bactrim per ID.  Macrocytic anemia  Iron panel consistent with iron deficiency, with low ferritin, elevated TIBC. 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 -For iron supplementation as outpatient.  Hypertension mentioned in the chart.   - Will follow blood pressure trends with as needed IV hydralazine.   -Was taking propranolol 10 mg twice daily in the hospital last month not sure if he is still taking it.       Subjective: Patient reports that finger has been painful.   Physical Exam: Vitals:   04/06/24 0038 04/06/24 0442 04/06/24 0901 04/06/24 1650  BP: 118/68 117/68 102/60 113/61  Pulse: 80 84 79 85  Resp: 18 18 16    Temp: 97.6 F (36.4 C) 98.1 F (36.7 C) 97.8 F (36.6 C) 98.2 F (36.8 C)  TempSrc:      SpO2: 98% 97% 98% 97%  Weight:      Height:         General: Alert, oriented X3  Eyes: Pupils equal, reactive  Oral cavity: moist mucous membranes  Head: Atraumatic, normocephalic  Neck: supple  Chest: clear to auscultation. No crackles, no wheezes  CVS: S1,S2 RRR. No murmurs   Abd: No distention, soft, non-tender. No masses palpable  Extr: No edema   MSK: Swelling of right little finger, contracture, open granulating wound on palmar aspect Neurological: Grossly intact.      Data Reviewed:     Latest Ref Rng & Units 04/06/2024    6:36 AM 04/05/2024    6:26 PM 03/26/2024    6:31 AM  CBC  WBC 4.0 - 10.5 K/uL 6.1  5.4  4.5   Hemoglobin 13.0 - 17.0 g/dL 81.1  91.4  78.2   Hematocrit 39.0 - 52.0 % 30.7  32.2  33.0   Platelets 150 - 400 K/uL 309  316  362       Latest Ref Rng & Units 04/06/2024    6:36 AM 04/05/2024    6:26 PM 03/22/2024    6:45 AM  BMP  Glucose 70 - 99 mg/dL 95  956  213   BUN 6 - 20 mg/dL 9  15  13    Creatinine 0.61 - 1.24 mg/dL 0.86  5.78  4.69   Sodium 135 - 145 mmol/L 135  132  134   Potassium 3.5 - 5.1 mmol/L 3.7  3.4  4.0   Chloride 98 - 111 mmol/L 107  103  104   CO2 22 - 32 mmol/L 23  21  23    Calcium 8.9 - 10.3 mg/dL 8.5  8.6  9.1      Family Communication: n/a  Disposition: Status is: Inpatient  Remains inpatient appropriate because: Requires IV antibiotics for treatment of his finger infection.  Planned Discharge Destination: Home DVT ppx: SCDs     Time spent: 40 minutes  Author: Marcine Matar, MD 04/06/2024 5:06 PM  For on call review [email or web address].

## 2024-04-06 NOTE — TOC Initial Note (Signed)
 Transition of Care [facility]) - Initial/Assessment Note    Patient Details  Name: Mark Galloway MRN: [id number] Date of Birth: 12/30/1986  Transition [facility] Care [facility]) CM/SW Contact:    Harriet Masson, RN Phone Number: 04/06/2024, 1:51 PM  Clinical Narrative:                 Spoke to patient regarding transition needs.  Patient lives with his mother and follows up with [name], NP at infectious disease.  Patient states his medications are too expensive and is willing to attempt to get assistance at ID office for HIV medications.  Patient will need transportation assistance at discharge.  TOC following.  Expected Discharge Plan: Home/Self Care Barriers to Discharge: Continued Medical Work up   Patient Goals and CMS Choice Patient states their goals for this hospitalization and ongoing recovery are:: retunr home with mom          Expected Discharge Plan and Services       Living arrangements for the past 2 months: Single Family Home                                      Prior Living Arrangements/Services Living arrangements for the past 2 months: Single Family Home Lives with:: Parents Patient language and need for interpreter reviewed:: Yes Do you feel safe going back to the place where you live?: Yes      Need for Family Participation in Patient Care: Yes (Comment) Care giver support system in place?: Yes (comment)   Criminal Activity/Legal Involvement Pertinent to Current Situation/Hospitalization: No - Comment as needed  Activities of Daily Living      Permission Sought/Granted                  Emotional Assessment Appearance:: Appears stated age Attitude/Demeanor/Rapport: Engaged Affect (typically observed): Accepting Orientation: : Oriented to Self, Oriented to Place, Oriented to  Time, Oriented to Situation Alcohol / Substance Use: Not Applicable Psych Involvement: No (comment)  Admission diagnosis:  Cellulitis of hand  [L03.119] Tenosynovitis of finger [M65.949] Osteomyelitis of right hand, unspecified type ([name]) [M86.9] Cellulitis [L03.90] Patient Active Problem List   Diagnosis Date Noted   Cellulitis of hand 04/05/2024   Macrocytic anemia 04/05/2024   MRSA infection 03/23/2024   Tenosynovitis of finger 03/23/2024   Osteomyelitis of right hand ([name]) 03/21/2024   Cellulitis of foot, left 03/21/2024   HTN (hypertension) 03/21/2024   Wound infection after surgery 03/17/2024   Hyponatremia 03/17/2024   Paranoid schizophrenia ([name]) 03/14/2024   Cellulitis of right hand 02/22/2024   Cellulitis 02/22/2024   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 [address] & [facility] [address] [place] [address] Phone: [phone] Fax: [phone]  [id number] [place],  - [address] AT CORNER OF [facility] [address] [place] [address] Phone: [phone] Fax: [phone]  [facility] [phone] @ [address] [place], [place] - [address] [place] [address] Phone: [phone] Fax: [phone]  [facility] [address] [place] [address] Phone: [phone] Fax: [phone]  [facility] REGIONAL - [facility] [address] [place] [address] Phone: [phone] Fax: [phone]  [id number] [place], [place] - [address] AT [facility] [address] [place] [address] Phone: [phone] Fax: [phone]     Social Drivers of Health (SDOH) Social History: SDOH Screenings   Food Insecurity:  Food Insecurity Present (03/14/2024)  Housing: High Risk (03/14/2024)  Transportation Needs: No Transportation Needs (03/14/2024)  Recent Concern: Transportation Needs - Unmet Transportation Needs (02/23/2024)  Utilities: Not At Risk (03/14/2024)  Recent Concern: Utilities - At Risk (02/23/2024)  Alcohol Screen: Low Risk  (03/14/2024)  Depression (PHQ2-9): Low Risk  (03/08/2023)  Financial Resource Strain: High Risk (01/04/2024)   [facility] from [name]  Social Connections: Unknown (05/04/2022)   Received from [facility]  Tobacco Use: High Risk (04/05/2024)   SDOH Interventions:     Readmission Risk Interventions     No data to display

## 2024-04-06 NOTE — Progress Notes (Signed)
 Spoke with Dr. Merlyn Lot this morning.  No plan for operative intervention at this time.  Patient can have a diet.  Finger and wrist range of motion should be encouraged.  OT consult recommended. Dr. Merlyn Lot will come by in the couple of days for repeat evaluation.  Can continue with antibiotics.  London Sheer, MD Orthopedic Surgeon

## 2024-04-06 NOTE — Progress Notes (Signed)
 Patient ID: Mark Galloway, male   DOB: Nov 29, 1986, 38 y.o.   MRN: [id number]   LOS: 0 days   Subjective: Mild pain at base of little finger but otherwise ok.   Objective: Vital signs in last 24 hours: Temp:  [97.6 F (36.4 C)-98.7 F (37.1 C)] 97.8 F (36.6 C) (04/07 0901) Pulse Rate:  [79-98] 79 (04/07 0901) Resp:  [16-18] 16 (04/07 0901) BP: (96-123)/(60-76) 102/60 (04/07 0901) SpO2:  [97 %-100 %] 98 % (04/07 0901) Weight:  [74.8 kg] 74.8 kg (04/06 1720)     Laboratory  CBC Recent Labs    04/05/24 1826 04/06/24 0636  WBC 5.4 6.1  HGB 10.3* 10.2*  HCT 32.2* 30.7*  PLT 316 309   BMET Recent Labs    04/05/24 1826 04/06/24 0636  NA 132* 135  K 3.4* 3.7  CL 103 107  CO2 21* 23  GLUCOSE 105* 95  BUN 15 9  CREATININE 0.71 0.63  CALCIUM 8.6* 8.5*     Physical Exam General appearance: alert and no distress Right hand -- Open wound volar P1 w/well granulated bed, no surrounding erythema or purulence. Flexion contracture. Numbness ulnar aspect finger.   Assessment/Plan: Right little finger infection -- Will start hydrotherapy and get ID consult.    Freeman Caldron, PA-C Orthopedic Surgery [phone] 04/06/2024

## 2024-04-06 NOTE — Plan of Care (Signed)

## 2024-04-06 NOTE — Progress Notes (Signed)
 Subjective:    Patient reports pain as controlled.  No fevers/chills.  Had hydrotherapy and states finger feels better.  He is encouraged by appearance after hydrotherapy.    Objective: Vital signs in last 24 hours: Temp:  [97.6 F (36.4 C)-98.2 F (36.8 C)] 98.2 F (36.8 C) (04/07 1650) Pulse Rate:  [79-98] 85 (04/07 1650) Resp:  [16-18] 16 (04/07 0901) BP: (96-119)/(60-76) 113/61 (04/07 1650) SpO2:  [97 %-99 %] 97 % (04/07 1650)  Intake/Output from previous day: 04/06 0701 - 04/07 0700 In: 500 [P.O.:500] Out: -  Intake/Output this shift: No intake/output data recorded.  Recent Labs    04/05/24 1826 04/06/24 0636  HGB 10.3* 10.2*   Recent Labs    04/05/24 1826 04/06/24 0636  WBC 5.4 6.1  RBC 3.16* 3.09*  3.14*  HCT 32.2* 30.7*  PLT 316 309   Recent Labs    04/05/24 1826 04/06/24 0636  NA 132* 135  K 3.4* 3.7  CL 103 107  CO2 21* 23  BUN 15 9  CREATININE 0.71 0.63  GLUCOSE 105* 95  CALCIUM 8.6* 8.5*   No results for input(s): "LABPT", "INR" in the last 72 hours.  Intact sensation and capillary refill in digit.  Dressing c/d/i   Assessment/Plan:    Right small finger s/p incision and drainage.  Still with wound on finger.  Not erythematous, no proximal streaking.  Granulation tissue in wound in photos.  Improved appearance since last office visit.  Discussed attempts to achieve wound healing with local wound care.  If unsuccessful may need repeat I&D vs coverage options vs amputation.  Continue antibiotics.  He voiced understanding of plan.   Betha Loa 04/06/2024, 6:22 PM

## 2024-04-06 NOTE — Progress Notes (Signed)
 Physical Therapy Wound Evaluation and Treatment Patient Details  Name: Mark Galloway MRN: [id number] Date of Birth: 1986-11-23  Today's Date: 04/06/2024 Time: 4098-1191 Time Calculation (min): 27 min  Subjective  Subjective Assessment Subjective: Pt pleasant and agreeable to physical therapy goals. Patient and Family Stated Goals: Heal wound, get ROM back in finger Date of Onset:  (Unsure) Prior Treatments: 02/22/24 I&D per Dr. Merlyn Lot  Pain Score:  Pt premedicated for pain  Wound Assessment  Wound / Incision (Open or Dehisced) 04/06/24 Incision - Dehisced Finger (Comment which one) Anterior;Right (Active)  Wound Image   04/06/24 1400  Dressing Type Gauze (Comment);Normal saline moist dressing;Moist to moist 04/06/24 1400  Dressing Changed New 04/06/24 1400  Dressing Status Clean, Dry, Intact 04/06/24 1400  Dressing Change Frequency Daily 04/06/24 1400  Site / Wound Assessment Red;Pink;Yellow;Brown 04/06/24 1400  % Wound base Red or Granulating 30% 04/06/24 1400  % Wound base Yellow/Fibrinous Exudate 70% 04/06/24 1400  % Wound base Black/Eschar 0% 04/06/24 1400  % Wound base Other/Granulation Tissue (Comment) 0% 04/06/24 1400  Peri-wound Assessment Intact 04/06/24 1400  Wound Length (cm) 2.2 cm 04/06/24 1400  Wound Width (cm) 2 cm 04/06/24 1400  Wound Depth (cm) 0.2 cm 04/06/24 1400  Wound Volume (cm^3) 0.88 cm^3 04/06/24 1400  Wound Surface Area (cm^2) 4.4 cm^2 04/06/24 1400  Tunneling (cm) 0 04/06/24 1400  Undermining (cm) 0 04/06/24 1400  Margins Unattached edges (unapproximated) 04/06/24 1400  Closure None 04/06/24 1400  Drainage Amount Moderate 04/06/24 1400  Drainage Description Purulent 04/06/24 1400  Treatment Debridement (Selective);Hydrotherapy (Pulse lavage);Packing (Saline gauze) 04/06/24 1400   Hydrotherapy Pulsed lavage therapy - wound location: R 5th finger Pulsed Lavage with Suction (psi): 12 psi Pulsed Lavage with Suction - Normal Saline Used: 1000 mL Pulsed  Lavage Tip: Tip with splash shield Selective Debridement (non-excisional) Selective Debridement (non-excisional) - Location: R 5th finger Selective Debridement (non-excisional) - Tools Used: Scalpel Selective Debridement (non-excisional) - Tissue Removed: Yellow/brown nonviable tissue vs scabbing    Wound Assessment and Plan  Wound Therapy - Assess/Plan/Recommendations Wound Therapy - Clinical Statement: This patient presents to wound therapy with dehisced wound from prior I&D on 02/22/24. Noted wound bed was oozing purulence with attempts at ROM as finger with flexion contracture. Pulsed lavage and debridement initiated, and NS dressing placed at end of session. Instructed pt in ROM HEP and need to keep dressing on and wound bed clean. This patient will benefit from continued wound therapy for selective removal of unviable tissue, to decrease bioburden, and promote wound bed healing. Wound Therapy - Functional Problem List: Decreased AROM - flexion contracture Factors Delaying/Impairing Wound Healing: Infection - systemic/local Hydrotherapy Plan: Debridement, Dressing change, Patient/family education, Pulsatile lavage with suction Wound Therapy - Frequency: 5X / week Wound Therapy - Current Recommendations: OT Wound Therapy - Follow Up Recommendations: dressing changes by family/patient  Wound Therapy Goals- Improve the function of patient's integumentary system by progressing the wound(s) through the phases of wound healing (inflammation - proliferation - remodeling) by: Wound Therapy Goals - Improve the function of patient's integumentary system by progressing the wound(s) through the phases of wound healing by: Decrease Necrotic Tissue to: 0 Decrease Necrotic Tissue - Progress: Goal set today Increase Granulation Tissue to: 100 Increase Granulation Tissue - Progress: Goal set today Goals/treatment plan/discharge plan were made with and agreed upon by patient/family: Yes Time For Goal  Achievement: 7 days Wound Therapy - Potential for Goals: Good  Goals will be updated until maximal potential achieved or discharge criteria  met.  Discharge criteria: when goals achieved, discharge from hospital, MD decision/surgical intervention, no progress towards goals, refusal/missing three consecutive treatments without notification or medical reason.  GP     Charges PT Wound Care Charges $Wound Debridement up to 20 cm: < or equal to 20 cm $PT PLS Gun and Tip: 1 Supply $PT Hydrotherapy Visit: 1 Visit       Marylynn Pearson 04/06/2024, 3:52 PM  Conni Slipper, PT, DPT Acute Rehabilitation Services Secure Chat Preferred Office: [phone]

## 2024-04-06 NOTE — Progress Notes (Signed)
 Pharmacy Antibiotic Note  Detron Carras is a 38 y.o. male admitted on 04/05/2024 with  wound infection .  Pharmacy has been consulted for Vancomycin/Cefepime dosing. WBC WNL. Renal function good.   Plan: Vancomycin 1500 mg IV q12h >>>Estimated AUC: 508 Cefepime 2g IV q8h Trend WBC, temp, renal function  F/U infectious work-up Drug levels as indicated   Height: 5\' 9"  (175.3 cm) Weight: 74.8 kg (165 lb) IBW/kg (Calculated) : 70.7  Temp (24hrs), Avg:98 F (36.7 C), Min:97.6 F (36.4 C), Max:98.7 F (37.1 C)  Recent Labs  Lab 04/05/24 1826  WBC 5.4  CREATININE 0.71  LATICACIDVEN 1.2    Estimated Creatinine Clearance: 126.4 mL/min (by C-G formula based on SCr of 0.71 mg/dL).    Allergies  Allergen Reactions   Peanut Oil Hives   Peanut-Containing Drug Products     Abran Duke, PharmD, BCPS Clinical Pharmacist Phone: [phone]

## 2024-04-07 ENCOUNTER — Other Ambulatory Visit (HOSPITAL_COMMUNITY): Payer: Self-pay

## 2024-04-07 DIAGNOSIS — L03119 Cellulitis of unspecified part of limb: Secondary | ICD-10-CM | POA: Diagnosis not present

## 2024-04-07 DIAGNOSIS — M65949 Unspecified synovitis and tenosynovitis, unspecified hand: Secondary | ICD-10-CM | POA: Diagnosis not present

## 2024-04-07 DIAGNOSIS — L03113 Cellulitis of right upper limb: Secondary | ICD-10-CM | POA: Diagnosis not present

## 2024-04-07 DIAGNOSIS — M86141 Other acute osteomyelitis, right hand: Secondary | ICD-10-CM | POA: Diagnosis not present

## 2024-04-07 DIAGNOSIS — B20 Human immunodeficiency virus [HIV] disease: Secondary | ICD-10-CM | POA: Diagnosis not present

## 2024-04-07 LAB — MRSA NEXT GEN BY PCR, NASAL: MRSA by PCR Next Gen: DETECTED — AB

## 2024-04-07 LAB — BASIC METABOLIC PANEL WITH GFR
Anion gap: 8 (ref 5–15)
BUN: 9 mg/dL (ref 6–20)
CO2: 23 mmol/L (ref 22–32)
Calcium: 9.2 mg/dL (ref 8.9–10.3)
Chloride: 106 mmol/L (ref 98–111)
Creatinine, Ser: 0.65 mg/dL (ref 0.61–1.24)
GFR, Estimated: >60 mL/min (ref >60)
Glucose, Bld: 92 mg/dL (ref 70–99)
Potassium: 4 mmol/L (ref 3.5–5.1)
Sodium: 137 mmol/L (ref 135–145)

## 2024-04-07 MED ORDER — ONDANSETRON HCL 4 MG/2ML IJ SOLN
4.0000 mg | Freq: Four times a day (QID) | INTRAMUSCULAR | Status: DC | PRN
  Administered 2024-04-07: 4 mg via INTRAVENOUS
  Filled 2024-04-07: qty 2

## 2024-04-07 NOTE — TOC Initial Note (Signed)
 Transition of Care [name]) - Initial/Assessment Note    Patient Details  Name: Mark Galloway MRN: [id number] Date of Birth: January 30, 1986  [name]) CM/SW Contact:    Marliss Coots, LCSW Phone Number: 04/07/2024, 10:27 AM  Clinical Narrative:                  10:27 AM CSW introduced self and role to patient at bedside. CSW inquired about SDOH needs (food, housing, utilities, transportation). Patient confirmed needs and stated that he is in between homes of family and friends who may not be able to provide transportation at discharge. Patient expressed difficulty in obtaining transportation to and from medical/non-medical appointments but is learning how to use the bus. Patient stated that he is ineligible for food stamps. CSW provided list of food pantries, financial assistance resource, transportation resources, and housing resources, including contact information for [employer]. CSW encourage patient to contact [facility] shelter prior to discharge to ensure bed. CSW also informed patient of transportation options for discharge (bus passes or taxi voucher) as well as how to obtain them (ask bedside RN). Patient expressed understanding of the information.  Expected Discharge Plan: Homeless Shelter Barriers to Discharge: Continued Medical Work up   Patient Goals and CMS Choice Patient states their goals for this hospitalization and ongoing recovery are:: retunr home with mom          Expected Discharge Plan and Services       Living arrangements for the past 2 months: Single Family Home                                      Prior Living Arrangements/Services Living arrangements for the past 2 months: Single Family Home Lives with:: Parents Patient language and need for interpreter reviewed:: Yes Do you feel safe going back to the place where you live?: Yes      Need for Family Participation in Patient Care: Yes  (Comment) Care giver support system in place?: Yes (comment)   Criminal Activity/Legal Involvement Pertinent to Current Situation/Hospitalization: No - Comment as needed  Activities of Daily Living   ADL Screening (condition at time of admission) Independently performs ADLs?: Yes (appropriate for developmental age) Is the patient deaf or have difficulty hearing?: No Does the patient have difficulty seeing, even when wearing glasses/contacts?: No Does the patient have difficulty concentrating, remembering, or making decisions?: No  Permission Sought/Granted Permission sought to share information with : Family Supports Permission granted to share information with : No (Contact information on chart)  Share Information with NAME: Albertha Ghee     Permission granted to share info w Relationship: Sister  Permission granted to share info w Contact Information: [phone]  Emotional Assessment Appearance:: Appears stated age Attitude/Demeanor/Rapport: Engaged Affect (typically observed): Accepting Orientation: : Oriented to Self, Oriented to Place, Oriented to  Time, Oriented to Situation Alcohol / Substance Use: Not Applicable Psych Involvement: No (comment)  Admission diagnosis:  Cellulitis of hand [L03.119] Tenosynovitis of finger [M65.949] Osteomyelitis of right hand, unspecified type ([name]) [M86.9] Cellulitis [L03.90] Patient Active Problem List   Diagnosis Date Noted   Nonadherence to medical treatment 04/06/2024   Cellulitis of hand 04/05/2024   Macrocytic anemia 04/05/2024   MRSA infection 03/23/2024   Tenosynovitis of finger 03/23/2024   Osteomyelitis of right hand ([name]) 03/21/2024   Cellulitis of foot, left 03/21/2024   HTN (hypertension)  03/21/2024   Wound infection after surgery 03/17/2024   Hyponatremia 03/17/2024   Paranoid schizophrenia ([name]) 03/14/2024   Cellulitis of right hand 02/22/2024   Cellulitis 02/22/2024   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 AT CORNER OF [facility] [address] [place] [address] Phone: [phone] Fax: [phone]  [facility] [phone] @ [address] [place], [place] - [address] [place] [address] Phone: [phone] Fax: [phone]  [facility] [address] [place] [address] Phone: [phone] Fax: [phone]  [facility] REGION[facility] - [facility] [address] [place] [address] Phone: [phone] Fax: [phone]  [id number] [place], [place] - [address] AT [facility] [address] [place] [address] Phone: [phone] Fax: [phone]     Social Drivers of Health (SDOH) Social History: SDOH Screenings   Food Insecurity: Food Insecurity Present (04/06/2024)  Housing: High Risk (04/06/2024)  Transportation Needs: Unmet Transportation Needs (04/06/2024)  Utilities: At Risk (04/06/2024)  Alcohol Screen: Low Risk  (03/14/2024)  Depression (PHQ2-9): Low Risk  (03/08/2023)  Financial Resource Strain: High Risk (01/04/2024)   Received from [facility]  Social Connections: Unknown (05/04/2022)   Received from [facility]  Tobacco Use: High Risk (04/05/2024)   SDOH Interventions:     Readmission Risk Interventions     No data to display

## 2024-04-07 NOTE — Plan of Care (Signed)

## 2024-04-07 NOTE — Progress Notes (Signed)
 Physical Therapy Wound Treatment Patient Details  Name: Mark Galloway MRN: [id number] Date of Birth: 01-21-1986  Today's Date: 04/07/2024 Time: 7829-5621 Time Calculation (min): 26 min  Subjective  Subjective Assessment Subjective: Pt pleasant and agreeable to wound therapy Patient and Family Stated Goals: Heal wound, get ROM back in finger Date of Onset:  (Unsure) Prior Treatments: 02/22/24 I&D per Dr. Merlyn Lot  Pain Score:  Pt premedicated for pain, asleep throughout session.   Wound Assessment  Wound / Incision (Open or Dehisced) 04/06/24 Incision - Dehisced Finger (Comment which one) Anterior;Right (Active)  Dressing Type Gauze (Comment) 04/07/24 1450  Dressing Changed Changed 04/07/24 1450  Dressing Status Clean, Dry, Intact 04/07/24 1450  Dressing Change Frequency Daily 04/07/24 1450  Site / Wound Assessment Red 04/07/24 1450  % Wound base Red or Granulating 95% 04/07/24 1450  % Wound base Yellow/Fibrinous Exudate 5% 04/07/24 1450  % Wound base Black/Eschar 0% 04/07/24 1450  % Wound base Other/Granulation Tissue (Comment) 0% 04/07/24 1450  Peri-wound Assessment Intact 04/07/24 1450  Wound Length (cm) 2.2 cm 04/06/24 1400  Wound Width (cm) 2 cm 04/06/24 1400  Wound Depth (cm) 0.2 cm 04/06/24 1400  Wound Volume (cm^3) 0.88 cm^3 04/06/24 1400  Wound Surface Area (cm^2) 4.4 cm^2 04/06/24 1400  Tunneling (cm) 0 04/06/24 1400  Undermining (cm) 0 04/06/24 1400  Margins Unattached edges (unapproximated) 04/07/24 1450  Closure None 04/07/24 1450  Drainage Amount Minimal 04/07/24 1450  Drainage Description Serosanguineous 04/07/24 1450  Treatment Hydrotherapy (Pulse lavage);Packing (Saline gauze) 04/07/24 1450   Hydrotherapy Pulsed lavage therapy - wound location: R 5th finger Pulsed Lavage with Suction (psi): 12 psi Pulsed Lavage with Suction - Normal Saline Used: 1000 mL Pulsed Lavage Tip: Tip with splash shield Selective Debridement (non-excisional) Selective Debridement  (non-excisional) - Location: R 5th finger Selective Debridement (non-excisional) - Tools Used: Scalpel Selective Debridement (non-excisional) - Tissue Removed: Yellow/brown nonviable tissue vs scabbing    Wound Assessment and Plan  Wound Therapy - Assess/Plan/Recommendations Wound Therapy - Clinical Statement: Wound bed significantly improved from last session. Continued with pulsed lavage and worked on scar massage and stretching at MCP and PIP joints. This patient will benefit from continued wound therapy for selective removal of unviable tissue, to decrease bioburden, and promote wound bed healing. Wound Therapy - Functional Problem List: Decreased AROM - flexion contracture Factors Delaying/Impairing Wound Healing: Infection - systemic/local Hydrotherapy Plan: Debridement, Dressing change, Patient/family education, Pulsatile lavage with suction Wound Therapy - Frequency: 5X / week Wound Therapy - Current Recommendations: OT Wound Therapy - Follow Up Recommendations: dressing changes by family/patient  Wound Therapy Goals- Improve the function of patient's integumentary system by progressing the wound(s) through the phases of wound healing (inflammation - proliferation - remodeling) by: Wound Therapy Goals - Improve the function of patient's integumentary system by progressing the wound(s) through the phases of wound healing by: Decrease Necrotic Tissue to: 0 Decrease Necrotic Tissue - Progress: Progressing toward goal Increase Granulation Tissue to: 100 Increase Granulation Tissue - Progress: Progressing toward goal Goals/treatment plan/discharge plan were made with and agreed upon by patient/family: Yes Time For Goal Achievement: 7 days Wound Therapy - Potential for Goals: Good  Goals will be updated until maximal potential achieved or discharge criteria met.  Discharge criteria: when goals achieved, discharge from hospital, MD decision/surgical intervention, no progress towards goals,  refusal/missing three consecutive treatments without notification or medical reason.  GP     Charges PT Wound Care Charges $Wound Debridement up to 20 cm: < or equal  to 20 cm $PT PLS Gun and Tip: 1 Supply $PT Hydrotherapy Visit: 1 Visit       Marylynn Pearson 04/07/2024, 2:55 PM  Conni Slipper, PT, DPT Acute Rehabilitation Services Secure Chat Preferred Office: [phone]

## 2024-04-07 NOTE — Progress Notes (Signed)
 Progress Note   Patient: Mark Galloway ZOX:[id number] DOB: [birth year] DOA: 04/05/2024     1 DOS: the patient was seen and examined on 04/07/2024   Brief hospital course: 38 year old man with PMH of HIV on ART, bipolar disorder, recent right little finger infection who presented with worsening pain and swelling of the right little finger and is being treated for osteomyelitis.   Assessment and Plan:  Osteomyelitis of right little finger and Cellulitis of the right hand lateral  Patient recently treated for infection of right little finger. X-rays on presentation concerning for osteomyelitis. Patient has been seen by orthopedic surgery, who stated no plan for operative intervention at this time. Recommended ID consult. Infectious diseases been consulted.  Input appreciated. -Ertapenem, vancomycin, and considering transitioning to Daptomycin. -Will await further recs from ID.  Marland Kitchen HIV/AIDS  Last CD4 count was 163 on March 15, 2024.   Last viral load was 80.   -Continue home Biktarvy. -Started Bactrim per ID.  Macrocytic anemia  Iron panel consistent with iron deficiency, with low ferritin, elevated TIBC. 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 -For iron supplementation as outpatient.  Hypertension mentioned in the chart.   - Will follow blood pressure trends with as needed IV hydralazine.   -Was taking propranolol 10 mg twice daily in the hospital last month not sure if he is still taking it.       Subjective: Patient reports he is feeling a little better.    Physical Exam: Vitals:   04/06/24 1650 04/06/24 2042 04/07/24 0434 04/07/24 0832  BP: 113/61 114/66 111/83 116/75  Pulse: 85 80 73 80  Resp:  18 18   Temp: 98.2 F (36.8 C) 98.2 F (36.8 C) 98.6 F (37 C) 98 F (36.7 C)  TempSrc:   Oral   SpO2: 97% 99% 99% 98%  Weight:      Height:         General: Alert, oriented X3  Eyes: Pupils equal, reactive  Oral cavity: moist mucous membranes  Head: Atraumatic,  normocephalic  Neck: supple  Chest: clear to auscultation. No crackles, no wheezes  CVS: S1,S2 RRR. No murmurs  Abd: No distention, soft, non-tender. No masses palpable  Extr: No edema   MSK: Swelling of right little finger, contracture, open granulating wound on palmar aspect Neurological: Grossly intact.      Data Reviewed:     Latest Ref Rng & Units 04/06/2024    6:36 AM 04/05/2024    6:26 PM 03/26/2024    6:31 AM  CBC  WBC 4.0 - 10.5 K/uL 6.1  5.4  4.5   Hemoglobin 13.0 - 17.0 g/dL 81.1  91.4  78.2   Hematocrit 39.0 - 52.0 % 30.7  32.2  33.0   Platelets 150 - 400 K/uL 309  316  362       Latest Ref Rng & Units 04/07/2024    5:55 AM 04/06/2024    6:36 AM 04/05/2024    6:26 PM  BMP  Glucose 70 - 99 mg/dL 92  95  956   BUN 6 - 20 mg/dL 9  9  15    Creatinine 0.61 - 1.24 mg/dL 2.13  0.86  5.78   Sodium 135 - 145 mmol/L 137  135  132   Potassium 3.5 - 5.1 mmol/L 4.0  3.7  3.4   Chloride 98 - 111 mmol/L 106  107  103   CO2 22 - 32 mmol/L 23  23  21    Calcium 8.9 -  10.3 mg/dL 9.2  8.5  8.6      Family Communication: n/a  Disposition: Status is: Inpatient Remains inpatient appropriate because: Requires IV antibiotics for treatment of his finger infection.  Planned Discharge Destination: Home DVT ppx: SCDs     Time spent: 40 minutes  Author: Marcine Matar, MD 04/07/2024 11:20 AM  For on call review [email or web address].

## 2024-04-07 NOTE — Progress Notes (Signed)
 [facility]  Date of Admission:  04/05/2024     Reason for Follow Up: Cellulitis of hand  Total days of antibiotics 3         ASSESSMENT:  Mark Galloway blood cultures from 04/05/2024 are without growth till date in the setting of MRSA and ESBL E. coli right fifth finger infection status post debridement.  Orthopedics recommending wound care and antibiotics with surgery if indicated.  Continue current dose of ertapenem and vancomycin.  Therapeutic drug monitoring of renal function and vancomycin levels with adjustments per protocol.  Continue postoperative wound care per orthopedics.  Tolerating Biktarvy and Bactrim for OI prophylaxis.  Reminded of importance of taking medications daily to reduce risk of disease progression or complications in the future.  Continue contact precautions for MRSA and ESBL organism.  Remaining medical and supportive care per internal medicine.   PLAN:  Continue current dose of ertapenem and vancomycin. Therapeutic drug monitoring of renal function and vancomycin levels. Post operative wound care per orthopedics. Continue current dose of Biktarvy and Bactrim for OI prophylaxis. Continue contact precautions for MRSA and ESBL organism. Remaining medical and supportive care per internal medicine.  Principal Problem:   Cellulitis of hand Active Problems:   Bipolar 1 disorder ([name])   AIDS ([name])   Cellulitis   Paranoid schizophrenia ([name])   Hyponatremia   Osteomyelitis of right hand ([name])   Macrocytic anemia   Nonadherence to medical treatment    bictegravir-emtricitabine-tenofovir AF  1 tablet Oral Daily   OLANZapine zydis  15 mg Oral BID   sulfamethoxazole-trimethoprim  1 tablet Oral Daily    SUBJECTIVE:  Afebrile overnight with no acute events.  Tolerating antibiotics with no adverse side effects.  Denies fevers, chills, or sweats.  Allergies  Allergen Reactions   Peanut Oil Hives   Peanut-Containing Drug Products  Hives     Review of Systems: Review of Systems  Constitutional:  Negative for chills, fever and weight loss.  Respiratory:  Negative for cough, shortness of breath and wheezing.   Cardiovascular:  Negative for chest pain and leg swelling.  Gastrointestinal:  Negative for abdominal pain, constipation, diarrhea, nausea and vomiting.  Skin:  Negative for rash.      OBJECTIVE: Vitals:   04/06/24 1650 04/06/24 2042 04/07/24 0434 04/07/24 0832  BP: 113/61 114/66 111/83 116/75  Pulse: 85 80 73 80  Resp:  18 18   Temp: 98.2 F (36.8 C) 98.2 F (36.8 C) 98.6 F (37 C) 98 F (36.7 C)  TempSrc:   Oral   SpO2: 97% 99% 99% 98%  Weight:      Height:       Body mass index is 24.37 kg/m.  Physical Exam Constitutional:      General: He is not in acute distress.    Appearance: He is well-developed.  Cardiovascular:     Rate and Rhythm: Normal rate and regular rhythm.     Heart sounds: Normal heart sounds.  Pulmonary:     Effort: Pulmonary effort is normal.     Breath sounds: Normal breath sounds.  Skin:    General: Skin is warm and dry.  Neurological:     Mental Status: He is alert and oriented to person, place, and time.  Psychiatric:        Mood and Affect: Mood normal.     Lab Results Lab Results  Component Value Date   WBC 6.1 04/06/2024   HGB 10.2 (L) 04/06/2024  HCT 30.7 (L) 04/06/2024   MCV 99.4 04/06/2024   PLT 309 04/06/2024    Lab Results  Component Value Date   CREATININE 0.65 04/07/2024   BUN 9 04/07/2024   NA 137 04/07/2024   K 4.0 04/07/2024   CL 106 04/07/2024   CO2 23 04/07/2024    Lab Results  Component Value Date   ALT 29 04/05/2024   AST 31 04/05/2024   ALKPHOS 72 04/05/2024   BILITOT 0.5 04/05/2024     Microbiology: Recent Results (from the past 240 hours)  Blood culture (routine x 2)     Status: None (Preliminary result)   Collection Time: 04/05/24  6:26 PM   Specimen: BLOOD  Result Value Ref Range Status   Specimen Description    Final    BLOOD SITE NOT SPECIFIED Performed at [facility], [address], [place], [place] [address]    Special Requests   Final    BOTTLES DRAWN AEROBIC AND ANAEROBIC Blood Culture results may not be optimal due to an inadequate volume of blood received in culture bottles Performed at [facility], [address], [place], [place] [address]    Culture   Final    NO GROWTH 2 DAYS Performed at [facility], [address], [place], [place] [address]    Report Status PENDING  Incomplete  Blood culture (routine x 2)     Status: None (Preliminary result)   Collection Time: 04/05/24  9:10 PM   Specimen: BLOOD RIGHT ARM  Result Value Ref Range Status   Specimen Description BLOOD RIGHT ARM  Final   Special Requests   Final    BOTTLES DRAWN AEROBIC AND ANAEROBIC Blood Culture adequate volume   Culture   Final    NO GROWTH 2 DAYS Performed at [facility], [address], [place], [place] [address]    Report Status PENDING  Incomplete    I have personally spent 28 minutes involved in face-to-face and non-face-to-face activities for this patient on the day of the visit. Professional time spent includes the following activities: Preparing to see the patient (review of tests), Obtaining and/or reviewing separately obtained history (admission/discharge record), Performing a medically appropriate examination and/or evaluation , Ordering medications/tests/procedures, referring and communicating with other health care professionals, Documenting clinical information in the EMR, Independently interpreting results (not separately reported), Communicating results to the patient/family/caregiver, Counseling and educating the patient/family/caregiver and Care coordination (not separately reported).    Mark Eke, NP [facility]  04/07/2024  1:39 PM

## 2024-04-07 NOTE — Progress Notes (Signed)
   04/07/24 1500  Spiritual Encounters  Type of Visit Initial  Care provided to: Patient  Referral source Patient request  Reason for visit Routine spiritual support  Spiritual Framework  Community/Connection Family  Patient Stress Factors Health changes  Interventions  Spiritual Care Interventions Made Established relationship of care and support;Reflective listening;Prayer   Chaplain responded to consult request for major life transition. Patient was available at this time. He shared that he has been in the hospital for two weeks. He stated that the healing process getting better. He requested a prayer. Chaplain attentively listened and offered a prayer. Chaplain will be available if needed.  M.Kubra Delano Metz Resident [phone]

## 2024-04-08 DIAGNOSIS — L03119 Cellulitis of unspecified part of limb: Secondary | ICD-10-CM | POA: Diagnosis not present

## 2024-04-08 LAB — BASIC METABOLIC PANEL WITH GFR
Anion gap: 8 (ref 5–15)
BUN: 11 mg/dL (ref 6–20)
CO2: 22 mmol/L (ref 22–32)
Calcium: 9 mg/dL (ref 8.9–10.3)
Chloride: 104 mmol/L (ref 98–111)
Creatinine, Ser: 0.72 mg/dL (ref 0.61–1.24)
GFR, Estimated: >60 mL/min (ref >60)
Glucose, Bld: 105 mg/dL — ABNORMAL HIGH (ref 70–99)
Potassium: 4.1 mmol/L (ref 3.5–5.1)
Sodium: 134 mmol/L — ABNORMAL LOW (ref 135–145)

## 2024-04-08 LAB — VANCOMYCIN, PEAK
Vancomycin Pk: 51 ug/mL (ref 30–40)
Vancomycin Pk: 28 ug/mL — ABNORMAL LOW (ref 30–40)

## 2024-04-08 MED ORDER — CHLORHEXIDINE GLUCONATE CLOTH 2 % EX PADS
6.0000 | MEDICATED_PAD | Freq: Every day | CUTANEOUS | Status: AC
  Administered 2024-04-08 – 2024-04-12 (×5): 6 via TOPICAL

## 2024-04-08 MED ORDER — MUPIROCIN 2 % EX OINT
1.0000 | TOPICAL_OINTMENT | Freq: Two times a day (BID) | CUTANEOUS | Status: AC
  Administered 2024-04-08 – 2024-04-12 (×10): 1 via NASAL
  Filled 2024-04-08 (×2): qty 22

## 2024-04-08 NOTE — Progress Notes (Signed)
 Progress Note   Patient: Mark Galloway ZOX:[id number] DOB: [birth year] DOA: 04/05/2024     2 DOS: the patient was seen and examined on 04/08/2024   Brief hospital course: 38 year old man with PMH of HIV on ART, bipolar disorder, recent right little finger infection who presented with worsening pain and swelling of the right little finger and is being treated for osteomyelitis.   Assessment and Plan:  Osteomyelitis of right little finger and Cellulitis of the right hand lateral  Patient recently treated for infection of right little finger. X-rays on presentation concerning for osteomyelitis. Patient has been seen by orthopedic surgery, who stated no plan for operative intervention at this time. Recommended ID consult. Infectious diseases been consulted.  Input appreciated. -Ertapenem, vancomycin, and considering transitioning to Daptomycin. -Will await further recs from ID.  Marland Kitchen HIV/AIDS  Last CD4 count was 163 on March 15, 2024.   Last viral load was 80.   -Continue home Biktarvy. -Started Bactrim per ID.  Macrocytic anemia  Iron panel consistent with iron deficiency, with low ferritin, elevated TIBC. 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 -For iron supplementation as outpatient.  Hypertension mentioned in the chart.   - Will follow blood pressure trends with as needed IV hydralazine.   -Was taking propranolol 10 mg twice daily in the hospital last month not sure if he is still taking it.       Subjective: Patient has no new complaints.     Physical Exam: Vitals:   04/07/24 1623 04/07/24 1925 04/08/24 0352 04/08/24 0850  BP: 108/63 133/77 109/62 (!) 115/55  Pulse: 90 94 81 83  Resp:  16 16 17   Temp: 98.2 F (36.8 C) 98.3 F (36.8 C) 97.8 F (36.6 C) 98.2 F (36.8 C)  TempSrc:      SpO2: 100% 100% 98% 100%  Weight:      Height:         General: Alert, oriented X3  Eyes: Pupils equal, reactive  Oral cavity: moist mucous membranes  Head: Atraumatic, normocephalic   Neck: supple  Chest: clear to auscultation. No crackles, no wheezes  CVS: S1,S2 RRR. No murmurs  Abd: No distention, soft, non-tender. No masses palpable  Extr: No edema   MSK: Swelling of right little finger, contracture, open granulating wound on palmar aspect Neurological: Grossly intact.      Data Reviewed:     Latest Ref Rng & Units 04/06/2024    6:36 AM 04/05/2024    6:26 PM 03/26/2024    6:31 AM  CBC  WBC 4.0 - 10.5 K/uL 6.1  5.4  4.5   Hemoglobin 13.0 - 17.0 g/dL 81.1  91.4  78.2   Hematocrit 39.0 - 52.0 % 30.7  32.2  33.0   Platelets 150 - 400 K/uL 309  316  362       Latest Ref Rng & Units 04/08/2024    4:55 AM 04/07/2024    5:55 AM 04/06/2024    6:36 AM  BMP  Glucose 70 - 99 mg/dL 956  92  95   BUN 6 - 20 mg/dL 11  9  9    Creatinine 0.61 - 1.24 mg/dL 2.13  0.86  5.78   Sodium 135 - 145 mmol/L 134  137  135   Potassium 3.5 - 5.1 mmol/L 4.1  4.0  3.7   Chloride 98 - 111 mmol/L 104  106  107   CO2 22 - 32 mmol/L 22  23  23    Calcium 8.9 - 10.3  mg/dL 9.0  9.2  8.5      Family Communication: n/a  Disposition: Status is: Inpatient Remains inpatient appropriate because: Requires IV antibiotics for treatment of his finger infection.  Planned Discharge Destination: Home DVT ppx: SCDs     Time spent: 40 minutes  Author: Marcine Matar, MD 04/08/2024 3:53 PM  For on call review [email or web address].

## 2024-04-08 NOTE — TOC Progression Note (Signed)
 Transition of Care [facility]) - Progression Note    Patient Details  Name: Mark Galloway MRN: [id number] Date of Birth: 1986-10-11  Transition of Care [facility]) CM/SW Contact  Harriet Masson, RN Phone Number: 04/08/2024, 12:53 PM  Clinical Narrative:    Wonda Cheng message to review patient for medicaid.  TOC following.    Expected Discharge Plan: Homeless Shelter Barriers to Discharge: Continued Medical Work up  Expected Discharge Plan and Services       Living arrangements for the past 2 months: Single Family Home                                       Social Determinants of Health (SDOH) Interventions SDOH Screenings   Food Insecurity: Food Insecurity Present (04/06/2024)  Housing: High Risk (04/06/2024)  Transportation Needs: Unmet Transportation Needs (04/06/2024)  Utilities: At Risk (04/06/2024)  Alcohol Screen: Low Risk  (03/14/2024)  Depression (PHQ2-9): Low Risk  (03/08/2023)  Financial Resource Strain: High Risk (01/04/2024)   Received from [facility]  Social Connections: Unknown (05/04/2022)   Received from [facility]  Tobacco Use: High Risk (04/05/2024)    Readmission Risk Interventions     No data to display

## 2024-04-08 NOTE — Progress Notes (Signed)
 Physical Therapy Wound Treatment and Discharge Patient Details  Name: Mark Galloway MRN: [id number] Date of Birth: 1986/10/14  Today's Date: 04/08/2024 Time: 1107-1130 Time Calculation (min): 23 min  Subjective  Subjective Assessment Subjective: Pt pleasant and agreeable to wound therapy Patient and Family Stated Goals: Heal wound, get ROM back in finger Date of Onset:  (Unsure) Prior Treatments: 02/22/24 I&D per Dr. Merlyn Lot  Pain Score:  Pt reports minimal pain during scar massage and stretching.   Wound Assessment     Wound / Incision (Open or Dehisced) 04/06/24 Incision - Dehisced Finger (Comment which one) Anterior;Right (Active)  Wound Image   04/08/24 1339  Dressing Type Gauze (Comment);Normal saline moist dressing;Moist to moist 04/08/24 1339  Dressing Changed Changed 04/08/24 1339  Dressing Status Clean, Dry, Intact 04/08/24 1339  Dressing Change Frequency Daily 04/08/24 1339  Site / Wound Assessment Red 04/08/24 1339  % Wound base Red or Granulating 100% 04/08/24 1339  % Wound base Yellow/Fibrinous Exudate 0% 04/08/24 1339  % Wound base Black/Eschar 0% 04/08/24 1339  % Wound base Other/Granulation Tissue (Comment) 0% 04/08/24 1339  Peri-wound Assessment Intact 04/08/24 1339  Wound Length (cm) 2.2 cm 04/08/24 1339  Wound Width (cm) 2 cm 04/08/24 1339  Wound Depth (cm) 0.2 cm 04/08/24 1339  Wound Volume (cm^3) 0.88 cm^3 04/08/24 1339  Wound Surface Area (cm^2) 4.4 cm^2 04/08/24 1339  Tunneling (cm) 0 04/08/24 1339  Undermining (cm) 0 04/08/24 1339  Margins Unattached edges (unapproximated) 04/08/24 1339  Closure None 04/08/24 1339  Drainage Amount Minimal 04/08/24 1339  Drainage Description Serosanguineous 04/08/24 1339  Treatment Hydrotherapy (Pulse lavage);Packing (Saline gauze) 04/08/24 1339   Hydrotherapy Pulsed lavage therapy - wound location: R 5th finger Pulsed Lavage with Suction (psi): 12 psi Pulsed Lavage with Suction - Normal Saline Used: 1000 mL Pulsed  Lavage Tip: Tip with splash shield Selective Debridement (non-excisional) Selective Debridement (non-excisional) - Location: R 5th finger Selective Debridement (non-excisional) - Tools Used: Scalpel Selective Debridement (non-excisional) - Tissue Removed: Yellow/brown nonviable tissue vs scabbing    Wound Assessment and Plan  Wound Therapy - Assess/Plan/Recommendations Wound Therapy - Clinical Statement: Wound bed appears clean this date. Continued with pulsed lavage and worked on scar massage and stretching at MCP and PIP joints this session. Pt was educated on this as well to perform on his own. As wound bed is clean, recommend continued dressing changes by nursing staff. Wound therapy will sign off at this time. If needs change, please reconsult. Wound Therapy - Functional Problem List: Decreased AROM - flexion contracture Factors Delaying/Impairing Wound Healing: Infection - systemic/local Hydrotherapy Plan: Debridement, Dressing change, Patient/family education, Pulsatile lavage with suction Wound Therapy - Frequency: 5X / week Wound Therapy - Current Recommendations: OT Wound Therapy - Follow Up Recommendations: dressing changes by family/patient  Wound Therapy Goals- Improve the function of patient's integumentary system by progressing the wound(s) through the phases of wound healing (inflammation - proliferation - remodeling) by: Wound Therapy Goals - Improve the function of patient's integumentary system by progressing the wound(s) through the phases of wound healing by: Decrease Necrotic Tissue to: 0 Decrease Necrotic Tissue - Progress: Progressing toward goal Increase Granulation Tissue to: 100 Increase Granulation Tissue - Progress: Progressing toward goal Goals/treatment plan/discharge plan were made with and agreed upon by patient/family: Yes Time For Goal Achievement: 7 days Wound Therapy - Potential for Goals: Good  Goals will be updated until maximal potential achieved or  discharge criteria met.  Discharge criteria: when goals achieved, discharge from hospital, MD  decision/surgical intervention, no progress towards goals, refusal/missing three consecutive treatments without notification or medical reason.  GP     Charges PT Wound Care Charges $PT Hydrotherapy Dressing: 1 dressing $PT PLS Gun and Tip: 1 Supply $PT Hydrotherapy Visit: 1 Visit       Marylynn Pearson 04/08/2024, 1:51 PM  Conni Slipper, PT, DPT Acute Rehabilitation Services Secure Chat Preferred Office: [phone]

## 2024-04-08 NOTE — Plan of Care (Signed)
  Problem: Education: Goal: Knowledge of General Education information will improve Description: Including pain rating scale, medication(s)/side effects and non-pharmacologic comfort measures Outcome: Progressing   Problem: Health Behavior/Discharge Planning: Goal: Ability to manage health-related needs will improve Outcome: Progressing   Problem: Clinical Measurements: Goal: Ability to maintain clinical measurements within normal limits will improve Outcome: Progressing   Problem: Clinical Measurements: Goal: Will remain free from infection Outcome: Progressing   Problem: Clinical Measurements: Goal: Respiratory complications will improve Outcome: Progressing   Problem: Clinical Measurements: Goal: Cardiovascular complication will be avoided Outcome: Progressing   

## 2024-04-09 ENCOUNTER — Other Ambulatory Visit: Payer: Self-pay

## 2024-04-09 ENCOUNTER — Encounter (HOSPITAL_COMMUNITY): Payer: Self-pay | Admitting: Internal Medicine

## 2024-04-09 ENCOUNTER — Inpatient Hospital Stay (HOSPITAL_COMMUNITY): Payer: MEDICAID | Admitting: Anesthesiology

## 2024-04-09 ENCOUNTER — Encounter (HOSPITAL_COMMUNITY)
Admission: EM | Disposition: A | Discharge status: Home / Self Care | Payer: Self-pay | Source: Home / Self Care | Attending: Internal Medicine

## 2024-04-09 ENCOUNTER — Inpatient Hospital Stay (HOSPITAL_COMMUNITY): Payer: MEDICAID

## 2024-04-09 DIAGNOSIS — B9620 Unspecified Escherichia coli [E. coli] as the cause of diseases classified elsewhere: Secondary | ICD-10-CM

## 2024-04-09 DIAGNOSIS — L03119 Cellulitis of unspecified part of limb: Secondary | ICD-10-CM | POA: Diagnosis not present

## 2024-04-09 DIAGNOSIS — Z1612 Extended spectrum beta lactamase (ESBL) resistance: Secondary | ICD-10-CM | POA: Diagnosis not present

## 2024-04-09 DIAGNOSIS — B9562 Methicillin resistant Staphylococcus aureus infection as the cause of diseases classified elsewhere: Secondary | ICD-10-CM | POA: Diagnosis not present

## 2024-04-09 DIAGNOSIS — L03113 Cellulitis of right upper limb: Secondary | ICD-10-CM | POA: Diagnosis not present

## 2024-04-09 LAB — BASIC METABOLIC PANEL WITH GFR
Anion gap: 8 (ref 5–15)
BUN: 11 mg/dL (ref 6–20)
CO2: 22 mmol/L (ref 22–32)
Calcium: 9.6 mg/dL (ref 8.9–10.3)
Chloride: 103 mmol/L (ref 98–111)
Creatinine, Ser: 0.66 mg/dL (ref 0.61–1.24)
GFR, Estimated: >60 mL/min (ref >60)
Glucose, Bld: 101 mg/dL — ABNORMAL HIGH (ref 70–99)
Potassium: 4.4 mmol/L (ref 3.5–5.1)
Sodium: 133 mmol/L — ABNORMAL LOW (ref 135–145)

## 2024-04-09 LAB — VANCOMYCIN, TROUGH: Vancomycin Tr: 7 ug/mL — ABNORMAL LOW (ref 15–20)

## 2024-04-09 SURGERY — INCISION AND DRAINAGE, ABSCESS
Anesthesia: General | Laterality: Right

## 2024-04-09 MED ORDER — FENTANYL CITRATE (PF) 250 MCG/5ML IJ SOLN
INTRAMUSCULAR | Status: AC
  Filled 2024-04-09: qty 5

## 2024-04-09 MED ORDER — CHLORHEXIDINE GLUCONATE 0.12 % MT SOLN
OROMUCOSAL | Status: AC
  Administered 2024-04-09: 15 mL via OROMUCOSAL
  Filled 2024-04-09: qty 15

## 2024-04-09 MED ORDER — CHLORHEXIDINE GLUCONATE 0.12 % MT SOLN
15.0000 mL | Freq: Once | OROMUCOSAL | Status: AC
  Filled 2024-04-09: qty 15

## 2024-04-09 MED ORDER — MIDAZOLAM HCL 2 MG/2ML IJ SOLN
INTRAMUSCULAR | Status: AC
  Filled 2024-04-09: qty 2

## 2024-04-09 MED ORDER — PROPOFOL 10 MG/ML IV BOLUS
INTRAVENOUS | Status: AC
  Filled 2024-04-09: qty 20

## 2024-04-09 MED ORDER — VANCOMYCIN HCL 1250 MG/250ML IV SOLN
1250.0000 mg | Freq: Three times a day (TID) | INTRAVENOUS | Status: DC
  Administered 2024-04-09 – 2024-04-16 (×22): 1250 mg via INTRAVENOUS
  Filled 2024-04-09 (×26): qty 250

## 2024-04-09 MED ORDER — ORAL CARE MOUTH RINSE: 15.0000 mL | Freq: Once | OROMUCOSAL | Status: AC

## 2024-04-09 MED ORDER — ACETAMINOPHEN 500 MG PO TABS
1000.0000 mg | ORAL_TABLET | Freq: Once | ORAL | Status: AC
  Administered 2024-04-09: 1000 mg via ORAL
  Filled 2024-04-09: qty 2

## 2024-04-09 MED ORDER — LACTATED RINGERS IV SOLN: INTRAVENOUS | Status: DC

## 2024-04-09 NOTE — TOC Progression Note (Signed)
 Transition of Care [facility]) - Progression Note    Patient Details  Name: Mark Galloway MRN: [id number] Date of Birth: [birth year]  [name]) CM/SW Contact  Harriet Masson, RN Phone Number: 04/09/2024, 12:45 PM  Clinical Narrative:    I&D of finger today. TOC following.     Expected Discharge Plan: Homeless Shelter Barriers to Discharge: Continued Medical Work up  Expected Discharge Plan and Services       Living arrangements for the past 2 months: Single Family Home                                       Social Determinants of Health (SDOH) Interventions SDOH Screenings   Food Insecurity: Food Insecurity Present (04/06/2024)  Housing: High Risk (04/06/2024)  Transportation Needs: Unmet Transportation Needs (04/06/2024)  Utilities: At Risk (04/06/2024)  Alcohol Screen: Low Risk  (03/14/2024)  Depression (PHQ2-9): Low Risk  (03/08/2023)  Financial Resource Strain: High Risk (01/04/2024)   Received from [facility]  Social Connections: Unknown (05/04/2022)   Received from [facility]  Tobacco Use: High Risk (04/05/2024)    Readmission Risk Interventions     No data to display

## 2024-04-09 NOTE — Progress Notes (Signed)
 Pharmacy Antibiotic Note  Mark Galloway is a 38 y.o. male admitted on 04/05/2024 with  wound infection .  Pharmacy has been consulted for Vancomycin/Cefepime dosing. WBC WNL. Renal function good.   Vanco level assessment: Dose admin: 4/9 1837 Peak 28 mcg/mL 4/9 2144 Trough 7 mcg/mL 4/10 0535 eAUC 422.5 mcg*hr/mL cTrough: 5.8 mcg/mL  Plan: Change Vancomycin 1250 mg IV q8h eAUC 525.7 mcg*hr/mL (trough 11.5 mcg/mL) Cefepime 2g IV q8h Trend WBC, temp, renal function  F/U infectious work-up Drug levels as indicated   Height: 5\' 9"  (175.3 cm) Weight: 74.8 kg (165 lb) IBW/kg (Calculated) : 70.7  Temp (24hrs), Avg:98.2 F (36.8 C), Min:98.2 F (36.8 C), Max:98.3 F (36.8 C)  Recent Labs  Lab 04/05/24 1826 04/06/24 0636 04/07/24 0555 04/08/24 0455 04/08/24 2038 04/08/24 2144 04/09/24 0535  WBC 5.4 6.1  --   --   --   --   --   CREATININE 0.71 0.63 0.65 0.72  --   --  0.66  LATICACIDVEN 1.2  --   --   --   --   --   --   VANCOTROUGH  --   --   --   --   --   --  7*  VANCOPEAK  --   --   --   --  51* 28*  --     Estimated Creatinine Clearance: 126.4 mL/min (by C-G formula based on SCr of 0.66 mg/dL).    Allergies  Allergen Reactions   Peanut Oil Hives   Peanut-Containing Drug Products Hives    Makiya Jeune BS, PharmD, BCPS Clinical Pharmacist 04/09/2024 8:19 AM  Contact: [phone] after 3 PM  "Be curious, not judgmental..." -Debbora Dus

## 2024-04-09 NOTE — Progress Notes (Signed)
 Subjective: * Day of Surgery * Procedure(s) (LRB): INCISION AND DRAINAGE, ABSCESS (Right) Patient reports pain as minimal.  Happy with progress of finger.  No fevers or chills.    Objective: Vital signs in last 24 hours: Temp:  [97.8 F (36.6 C)-98.7 F (37.1 C)] 98.7 F (37.1 C) (04/10 1529) Pulse Rate:  [75-99] 75 (04/10 1529) Resp:  [16-18] 18 (04/10 1529) BP: (121-128)/(68-81) 122/79 (04/10 1529) SpO2:  [97 %-99 %] 99 % (04/10 1529)  Intake/Output from previous day: No intake/output data recorded. Intake/Output this shift: No intake/output data recorded.  No results for input(s): "HGB" in the last 72 hours. No results for input(s): "WBC", "RBC", "HCT", "PLT" in the last 72 hours. Recent Labs    04/08/24 0455 04/09/24 0535  NA 134* 133*  K 4.1 4.4  CL 104 103  CO2 22 22  BUN 11 11  CREATININE 0.72 0.66  GLUCOSE 105* 101*  CALCIUM 9.0 9.6   No results for input(s): "LABPT", "INR" in the last 72 hours.  Intact sensation and capillary refill in finger.  Wound with good granulation tissue.  No erythema.  Swelling decreased.  Active motion at mp joint.     Assessment/Plan: * Day of Surgery * Procedure(s) (LRB): INCISION AND DRAINAGE, ABSCESS (Right) Surgery cancelled.  Wound looks good.  U/S does not appear to show abscess formation.  Continue with wound care and antibiotics.     Mark Galloway 04/09/2024, 4:07 PM

## 2024-04-09 NOTE — Progress Notes (Signed)
 Progress Note   Patient: Mark Galloway GNF:[id number] DOB: 03-Jun-1986 DOA: 04/05/2024     3 DOS: the patient was seen and examined on 04/09/2024   Brief hospital course: 38 year old man with PMH of HIV on ART, bipolar disorder, recent right little finger infection who presented with worsening pain and swelling of the right little finger and is being treated for osteomyelitis.   Assessment and Plan:  Osteomyelitis of right little finger and Cellulitis of the right hand lateral  Patient recently treated for infection of right little finger. X-rays on presentation concerning for osteomyelitis. Patient has been seen by orthopedic surgery, Infectious Disease. Input appreciated.  Patient scheduled for I&D 4/10.  - Continue Ertapenem, vancomycin.  -Will await final recs from ID after I&D. - Patient does not appear to be a candidate for a PICC line, given use of IV methamphetamine documented by ED provider on 02/21/2024.  Marland Kitchen HIV/AIDS  Last CD4 count was 163 on March 15, 2024.   Last viral load was 80.   -Continue home Biktarvy. -Started Bactrim per ID.  Macrocytic anemia  Iron panel consistent with iron deficiency, with low ferritin, elevated TIBC. 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 -For iron supplementation as outpatient.  Hypertension mentioned in the chart.   - Will follow blood pressure trends with as needed IV hydralazine.   -Was taking propranolol 10 mg twice daily in the hospital last month not sure if he is still taking it.       Subjective: Patient has no new complaints. Seen by Hand Surgery and scheduled for I&D today.     Physical Exam: Vitals:   04/08/24 1839 04/08/24 1928 04/09/24 0403 04/09/24 0847  BP: 128/68 126/79 121/81 121/76  Pulse: 93 99 82 90  Resp: 18 18 16    Temp: 98.3 F (36.8 C) 98.2 F (36.8 C) 98.2 F (36.8 C) 97.8 F (36.6 C)  TempSrc: Oral Oral  Oral  SpO2: 97% 99% 99% 99%  Weight:      Height:         General: Alert, oriented X3  Eyes:  Pupils equal, reactive  Oral cavity: moist mucous membranes  Head: Atraumatic, normocephalic  Neck: supple  Chest: clear to auscultation. No crackles, no wheezes  CVS: S1,S2 RRR. No murmurs  Abd: No distention, soft, non-tender. No masses palpable  Extr: No edema   MSK: Swelling of right little finger, contracture, open granulating wound on palmar aspect Neurological: Grossly intact.      Data Reviewed:     Latest Ref Rng & Units 04/06/2024    6:36 AM 04/05/2024    6:26 PM 03/26/2024    6:31 AM  CBC  WBC 4.0 - 10.5 K/uL 6.1  5.4  4.5   Hemoglobin 13.0 - 17.0 g/dL 84.6  96.2  95.2   Hematocrit 39.0 - 52.0 % 30.7  32.2  33.0   Platelets 150 - 400 K/uL 309  316  362       Latest Ref Rng & Units 04/09/2024    5:35 AM 04/08/2024    4:55 AM 04/07/2024    5:55 AM  BMP  Glucose 70 - 99 mg/dL 841  324  92   BUN 6 - 20 mg/dL 11  11  9    Creatinine 0.61 - 1.24 mg/dL 4.01  0.27  2.53   Sodium 135 - 145 mmol/L 133  134  137   Potassium 3.5 - 5.1 mmol/L 4.4  4.1  4.0   Chloride 98 - 111 mmol/L  103  104  106   CO2 22 - 32 mmol/L 22  22  23    Calcium 8.9 - 10.3 mg/dL 9.6  9.0  9.2      Family Communication: n/a  Disposition: Status is: Inpatient Remains inpatient appropriate because: Requires IV antibiotics for treatment of his finger infection.  Planned Discharge Destination: Home DVT ppx: SCDs     Time spent: 40 minutes  Author: Marcine Matar, MD 04/09/2024 2:06 PM  For on call review [email or web address].

## 2024-04-09 NOTE — Plan of Care (Signed)

## 2024-04-09 NOTE — TOC Progression Note (Signed)
 Transition of Care [facility]) - Progression Note    Patient Details  Name: Mark Galloway MRN: [id number] Date of Birth: [birth year]  [name] CM/SW Contact  Lockie Pares, RN Phone Number: 04/09/2024, 2:00 PM  Clinical Narrative:     Shelter resource guide added to AVS. The current short term housing options are listed there for shelters.   Expected Discharge Plan: Homeless Shelter Barriers to Discharge: Continued Medical Work up  Expected Discharge Plan and Services       Living arrangements for the past 2 months: Single Family Home                                       Social Determinants of Health (SDOH) Interventions SDOH Screenings   Food Insecurity: Food Insecurity Present (04/06/2024)  Housing: High Risk (04/06/2024)  Transportation Needs: Unmet Transportation Needs (04/06/2024)  Utilities: At Risk (04/06/2024)  Alcohol Screen: Low Risk  (03/14/2024)  Depression (PHQ2-9): Low Risk  (03/08/2023)  Financial Resource Strain: High Risk (01/04/2024)   Received from [facility]  Social Connections: Unknown (05/04/2022)   Received from [facility]  Tobacco Use: High Risk (04/05/2024)    Readmission Risk Interventions     No data to display

## 2024-04-09 NOTE — Anesthesia Preprocedure Evaluation (Addendum)
 Anesthesia Evaluation    Reviewed: Allergy & Precautions, Patient's Chart, lab work & pertinent test results, reviewed documented beta blocker date and time   Airway Mallampati: II  TM Distance: >3 FB Neck ROM: Full    Dental no notable dental hx.    Pulmonary Current Smoker and Patient abstained from smoking.   Pulmonary exam normal breath sounds clear to auscultation       Cardiovascular hypertension (121/81 preop), Pt. on medications and Pt. on home beta blockers Normal cardiovascular exam Rhythm:Regular Rate:Normal     Neuro/Psych  PSYCHIATRIC DISORDERS Anxiety  Bipolar Disorder Schizophrenia  negative neurological ROS     GI/Hepatic negative GI ROS, Neg liver ROS,,,  Endo/Other  negative endocrine ROS    Renal/GU negative Renal ROS  negative genitourinary   Musculoskeletal negative musculoskeletal ROS (+)    Abdominal   Peds  Hematology  (+) Blood dyscrasia, anemia , HIVHb 10.2   Anesthesia Other Findings   Reproductive/Obstetrics negative OB ROS                             Anesthesia Physical Anesthesia Plan  ASA: 3  Anesthesia Plan: General   Post-op Pain Management:    Induction: Intravenous  PONV Risk Score and Plan: 2 and Ondansetron, Dexamethasone, Treatment may vary due to age or medical condition and Midazolam  Airway Management Planned: LMA  Additional Equipment:   Intra-op Plan:   Post-operative Plan: Extubation in OR  Informed Consent: I have reviewed the patients History and Physical, chart, labs and discussed the procedure including the risks, benefits and alternatives for the proposed anesthesia with the patient or authorized representative who has indicated his/her understanding and acceptance.     Dental advisory given  Plan Discussed with: CRNA  Anesthesia Plan Comments:        Anesthesia Quick Evaluation

## 2024-04-09 NOTE — Progress Notes (Signed)
 [facility]  Date of Admission:  04/05/2024     Reason for Follow Up: Cellulitis of hand  Total days of antibiotics 5         ASSESSMENT:  Mr. Mark Galloway is scheduled for repeat debridement of the right fifth finger in the setting of MRSA and ESBL E. coli right fifth finger infection.  Tolerating medications with no adverse side effects.  Discussed plan of care following debridement to continue with antibiotics with ertapenem and vancomycin.  Therapeutic drug monitoring of vancomycin levels and renal function.  Additional surgical interventions and postoperative wound care per orthopedics.  Continue contact precautions for ESBL and MRSA.  Tolerating Biktarvy with no adverse side effects supplemented with Bactrim for OI prophylaxis.  Mood appears stable with current dose of olanzapine.  Housing remains a concern.  Will ask for social work assistance and short-term housing while awaiting additional options.  Remaining medical and supportive care per internal medicine.    PLAN:  Continue current dose of vancomycin and ertapenem. Therapeutic drug monitoring of renal function and vancomycin levels. Surgical intervention and postoperative wound care per orthopedics. Continue Biktarvy supplemented with Bactrim for OI prophylaxis Social work consult for assistance in housing. Continue contact precautions for MRSA and ESBL organism. Remaining medical and supportive care per internal medicine.  Principal Problem:   Cellulitis of hand Active Problems:   Bipolar 1 disorder ([name])   AIDS ([name])   Cellulitis   Paranoid schizophrenia ([name])   Hyponatremia   Osteomyelitis of right hand ([name])   Macrocytic anemia   Nonadherence to medical treatment    bictegravir-emtricitabine-tenofovir AF  1 tablet Oral Daily   Chlorhexidine Gluconate Cloth  6 each Topical Daily   mupirocin ointment  1 Application Nasal BID   OLANZapine zydis  15 mg Oral BID    sulfamethoxazole-trimethoprim  1 tablet Oral Daily    SUBJECTIVE:  Afebrile overnight with no acute events.  Plans for surgical intervention this afternoon.  Tolerating antibiotics with no adverse side effects.  Denies fevers, chills, or sweats.  Allergies  Allergen Reactions   Peanut Oil Hives   Peanut-Containing Drug Products Hives     Review of Systems: Review of Systems  Constitutional:  Negative for chills, fever and weight loss.  Respiratory:  Negative for cough, shortness of breath and wheezing.   Cardiovascular:  Negative for chest pain and leg swelling.  Gastrointestinal:  Negative for abdominal pain, constipation, diarrhea, nausea and vomiting.  Skin:  Negative for rash.      OBJECTIVE: Vitals:   04/08/24 1839 04/08/24 1928 04/09/24 0403 04/09/24 0847  BP: 128/68 126/79 121/81 121/76  Pulse: 93 99 82 90  Resp: 18 18 16    Temp: 98.3 F (36.8 C) 98.2 F (36.8 C) 98.2 F (36.8 C) 97.8 F (36.6 C)  TempSrc: Oral Oral  Oral  SpO2: 97% 99% 99% 99%  Weight:      Height:       Body mass index is 24.37 kg/m.  Physical Exam Constitutional:      General: He is not in acute distress.    Appearance: He is well-developed.  Cardiovascular:     Rate and Rhythm: Normal rate and regular rhythm.     Heart sounds: Normal heart sounds.  Pulmonary:     Effort: Pulmonary effort is normal.     Breath sounds: Normal breath sounds.  Skin:    General: Skin is warm and dry.  Neurological:     Mental Status:  He is alert and oriented to person, place, and time.  Psychiatric:        Mood and Affect: Mood normal.     Lab Results Lab Results  Component Value Date   WBC 6.1 04/06/2024   HGB 10.2 (L) 04/06/2024   HCT 30.7 (L) 04/06/2024   MCV 99.4 04/06/2024   PLT 309 04/06/2024    Lab Results  Component Value Date   CREATININE 0.66 04/09/2024   BUN 11 04/09/2024   NA 133 (L) 04/09/2024   K 4.4 04/09/2024   CL 103 04/09/2024   CO2 22 04/09/2024    Lab Results   Component Value Date   ALT 29 04/05/2024   AST 31 04/05/2024   ALKPHOS 72 04/05/2024   BILITOT 0.5 04/05/2024     Microbiology: Recent Results (from the past 240 hours)  Blood culture (routine x 2)     Status: None (Preliminary result)   Collection Time: 04/05/24  6:26 PM   Specimen: BLOOD  Result Value Ref Range Status   Specimen Description   Final    BLOOD SITE NOT SPECIFIED Performed at [facility], [address] [place], [place] [address]    Special Requests   Final    BOTTLES DRAWN AEROBIC AND ANAEROBIC Blood Culture results may not be optimal due to an inadequate volume of blood received in culture bottles Performed at [facility], [address], [place], [place] [address]    Culture   Final    NO GROWTH 4 DAYS Performed at [facility], [address], [place], [place] [address]    Report Status PENDING  Incomplete  Blood culture (routine x 2)     Status: None (Preliminary result)   Collection Time: 04/05/24  9:10 PM   Specimen: BLOOD RIGHT ARM  Result Value Ref Range Status   Specimen Description BLOOD RIGHT ARM  Final   Special Requests   Final    BOTTLES DRAWN AEROBIC AND ANAEROBIC Blood Culture adequate volume   Culture   Final    NO GROWTH 4 DAYS Performed at [facility], [address] [place], [place] [address]    Report Status PENDING  Incomplete  MRSA Next Gen by PCR, Nasal     Status: Abnormal   Collection Time: 04/07/24  9:04 AM   Specimen: Nasal Mucosa; Nasal Swab  Result Value Ref Range Status   MRSA by PCR Next Gen DETECTED (A) NOT DETECTED Final    Comment: RESULT CALLED TO, READ BACK BY AND VERIFIED WITH: RN D. [id number] @1547  FH (NOTE) The GeneXpert MRSA Assay (FDA approved for NASAL specimens only), is one component of a comprehensive MRSA colonization surveillance program. It is not intended to diagnose MRSA infection nor to guide or monitor treatment for MRSA infections. Test  performance is not FDA approved in patients less than 56 years old. Performed at [facility], [address] [place], [place] [address]      Mark Galloway, Mark Galloway [facility]  04/09/2024  1:39 PM

## 2024-04-09 NOTE — Progress Notes (Signed)
 Surgery was canceled per Dr. Merlyn Lot. The floor nurse was notified. Patient was transported back to the floor by OR staff.

## 2024-04-09 NOTE — Evaluation (Signed)
 Occupational Therapy Evaluation Patient Details Name: Mark Galloway MRN: [id number] DOB: 1986-06-30 Today's Date: 04/09/2024   History of Present Illness   Pt is a 38 yo male admitted to [facility] on 04/05/24 with worsening swelling and pain in the R hand little finger. PMH of HIV, schizophrenia, bipolar disorder, I &D R little finger.     Clinical Impressions Pt admitted for above, PTA he reports being ind with ADLs/iADLs and mobility. Pt currently presenting with pain in R little finger and limited ROM, pt also noted to have weak grasp secondary to inability to make full composite fist with little finger involved. Educated pt on ROM exercises to promote ROM in R little finger, handout provided for carryover. OT to continue following pt acutely to address listed deficits and help transition to next level of care. Recommend pt follow-up with outpatient OT services to promote ROM.      If plan is discharge home, recommend the following:   Other (comment) (prn)     Functional Status Assessment   Patient has had a recent decline in their functional status and demonstrates the ability to make significant improvements in function in a reasonable and predictable amount of time.     Equipment Recommendations   None recommended by OT     Recommendations for Other Services         Precautions/Restrictions   Precautions Precautions: None Recall of Precautions/Restrictions: Intact Restrictions Weight Bearing Restrictions Per Provider Order: No     Mobility Bed Mobility Overal bed mobility: Independent                  Transfers                          Balance Overall balance assessment: Mild deficits observed, not formally tested                                         ADL either performed or assessed with clinical judgement   ADL Overall ADL's : Independent                                       General ADL Comments:  Pt eating without challenge, demonstrated abiltiy to open containers without challenge, Reports dressing self.     Vision         Perception         Praxis         Pertinent Vitals/Pain Pain Assessment Pain Assessment: 0-10 Pain Score: 6  Pain Location: R Little- stayed the same with ROM Pain Descriptors / Indicators: Dull Pain Intervention(s): Limited activity within patient's tolerance, Monitored during session     Extremity/Trunk Assessment Upper Extremity Assessment Upper Extremity Assessment: RUE deficits/detail;Right hand dominant RUE Deficits / Details: Weak grasp, unable to make full composite fist with pinky finger included. Pinky finger noted to be eematuos and DIP limited/rests in 10 degrees of flexion, difficult to fully assess PIP ROM due to bandaging but ROM is limited and it also rests in slight flexion.           Communication Communication Communication: No apparent difficulties   Cognition Arousal: Alert Behavior During Therapy: WFL for tasks assessed/performed Cognition: No apparent impairments  Following commands: Intact       Cueing  General Comments   Cueing Techniques: Verbal cues      Exercises Other Exercises Other Exercises: Tendon glides (turn palm towards him then use LUE to pull in the pinky) Other Exercises: DIP/PIP flexion and extension R little finger Other Exercises: Isolated finger ext with palm suppoted on flat suface RUE Other Exercises: Palm flat, finger abduction/adduction   Shoulder Instructions      Home Living Family/patient expects to be discharged to:: Shelter/Homeless                                 Additional Comments: In between place per report. Prior notes indicate pt homeless      Prior Functioning/Environment Prior Level of Function : Independent/Modified Independent             Mobility Comments: Ind ADLs Comments: Ind,    OT Problem  List: Pain;Increased edema   OT Treatment/Interventions: Therapeutic exercise;Therapeutic activities;Patient/family education      OT Goals(Current goals can be found in the care plan section)   Acute Rehab OT Goals Patient Stated Goal: To get more ROM OT Goal Formulation: With patient Time For Goal Achievement: 04/23/24 Potential to Achieve Goals: Good ADL Goals Pt/caregiver will Perform Home Exercise Program: Right Upper extremity;With written HEP provided;Independently;Increased ROM;Increased strength Additional ADL Goal #2: Pt will demonstrate understanding of scar management and edema management strategies.   OT Frequency:  Min 4X/week    Co-evaluation              AM-PAC OT "6 Clicks" Daily Activity     Outcome Measure Help from another person eating meals?: None Help from another person taking care of personal grooming?: None Help from another person toileting, which includes using toliet, bedpan, or urinal?: None Help from another person bathing (including washing, rinsing, drying)?: None Help from another person to put on and taking off regular upper body clothing?: None Help from another person to put on and taking off regular lower body clothing?: None 6 Click Score: 24   End of Session Nurse Communication: Mobility status  Activity Tolerance: Patient tolerated treatment well Patient left: in bed;with call bell/phone within reach  OT Visit Diagnosis: Pain;Muscle weakness (generalized) (M62.81) Pain - Right/Left: Right Pain - part of body: Hand                Time: 1610-9604 OT Time Calculation (min): 18 min Charges:  OT General Charges $OT Visit: 1 Visit OT Evaluation $OT Eval Low Complexity: 1 Low  04/09/2024  AB, OTR/L  Acute Rehabilitation Services  Office: [phone]   Tristan Schroeder 04/09/2024, 6:07 PM

## 2024-04-09 NOTE — Plan of Care (Signed)

## 2024-04-09 NOTE — Progress Notes (Addendum)
 OT Cancellation Note  Patient Details Name: Alexzavier Girardin MRN: [id number] DOB: [birth year]   Cancelled Treatment:    Reason Eval/Treat Not Completed: Patient at procedure or test/ unavailable (plan for I & D this PM. Will follow up for OT eval as schedule permits)  Carver Fila, OTD, OTR/L SecureChat Preferred Acute Rehab [phone]   Carver Fila Koonce 04/09/2024, 4:10 PM

## 2024-04-09 NOTE — Discharge Instructions (Addendum)
 [employer] assistance programs Crisis assistance programs  -[facility] Homelessness Arts development officer. If you are experiencing homelessness in [place], [place] , your first point of contact should be Pensions consultant. You can reach Coordinated Entry by calling [phone] or by emailing [email or web address] [email or web address].  Community access points: [employer] [phone] [address], [place]) every Tuesday from 9am-10am. [facility] ([address] [place] [place]) every Wednesday from 8am-9am.   -[place] Coordinated Re-entry Jayson Michael: Dial 211 and request. Offers referrals to homeless shelters in the area.    -The [employer] [phone]) offers several services to local families, as funding allows. The Emergency Assistance Program (EAP), which they administer, provides household goods, free food, clothing, and financial aid to people in need in the [place]  area. The EAP program does have some qualification, and counselors will interview clients for financial assistance by written referral only. Referrals need to be made by the Department of Social Services or by other EAP approved human services agencies or charities in the area.  -Open Door Ministries of [employer], which can be reached at [phone], offers emergency assistance programs for those in need of help, such as food, rent assistance, a soup kitchen, shelter, and clothing. They are based in [facility]  but provide a number of services to those that qualify for assistance.   [facility] Department of Social Services may be able to offer temporary financial assistance and cash grants for paying rent and utilities, Help may be provided for local county residents who may be experiencing personal crisis when other resources, including government programs, are not available. Call [phone]  -High [employer] Army is a [employer] agency, The organization can offer emergency assistance for paying rent, Caremark Rx, utilities, food, household products and furniture. They offer extensive emergency and transitional housing for families, children and single women, and also run a Boy's and [employer]. Thrift Shops, Secondary school teacher, and other aid offered too. [address] [place], [place]  [address], [phone]  -[place] Low Income Energy Assistance Program -- This is offered for [facility] families. The federal government created [employer] Program provides a one-time cash grant payment to help eligible low-income families pay their electric and heating bills. [address], [place], [place]  [address], [phone]  -[place] Emergency Assistance -- A program offers emergency utility and rent funds for greater [employer] area residents. The program can also provide counseling and referrals to charities and government programs. Also provides food and a free meal program that serves lunch Mondays - Saturdays and dinner seven days per week to individuals in the community. [address] [place], [place]  [address], [phone]  -[employer] - Offers affordable apartment and housing communities across      [place] and [place]. The low income and seniors can access public housing, rental assistance to qualified applicants, and apply for the section 8 rent subsidy program. Other programs include Chiropractor and Engineer, maintenance. [address] [place], [place]  [address], dial [phone].  -The [facility] provides transitional housing to veterans and the disabled. Clients will also access other services too, including assistance in applying for Disability, life skills classes, case management, and assistance in finding permanent housing. [address], [place], [place]  [place] [address], call [phone]  -[place] Transitional Housing through [facility] is for people who were just  evicted or that are formerly homeless. The non-profit will also help then gain self-sufficiency, find a home or apartment to live in, and also provides information on rent assistance when needed. Phone [phone]  -The [place] [facility] [facility] helps low income, elderly, or disabled residents in seven counties in the [place] ([place], [place], [place], [place], [place], [place], [place], and [place]) save energy and reduce their utility bills by improving energy efficiency. Phone [phone].  -[employer] is located in the [place] [facility] in the [employer], [address], [place], [place] [address]. Parking is in the rear of the building. Phone: [phone]   General Email: info@gsohc .org  GHC provides free housing counseling assistance in locating affordable rental housing or housing with support services for families and individuals in crisis and the chronically homeless. We provide potential resources for other housing needs like utilities. Our trained counselors also work with clients on budgeting and financial literacy in effort to empower them to take control of their financial situations. [employer] collaborates with homeless service providers and other stakeholders as part of the [employer] COC (Continuum of Care). The (COC) is a regional/local planning body that coordinates housing and services funding for homeless families and individuals. The role of [facility] in the COC is through housing counseling to work with people we serve on diversion strategies for those that are at imminent risk of becoming homeless. We also work with the Coordinated Assessment/Entry Specialist who attempts to find temporary solutions and/or connects the people  to Housing First, Rapid Re-housing or transitional housing programs. Our Homelessness Prevention Housing Counselors meet with clients on business days (Monday-Fridays, except scheduled holidays) from 8:30 am to 4:30 pm.  Legal assistance for evictions, foreclosure, and more -If you need free legal advice on civil issues, such as foreclosures, evictions, Electronics engineer, government programs, domestic issues and more, Landscape architect of [place]  [facility]) is a Associate Professor firm that provides free legal services and counsel to lower income people, seniors, disabled, and others, The goal is to ensure everyone has access to justice and fair representation. Call them at ([phone].  [facility] for Housing and Community Studies can provide info about obtaining legal assistance with evictions. Phone [phone].  Data processing manager  The [employer], [employer]. offers job and Dispensing optician. Resources are focused on helping students obtain the skills and experiences that are necessary to compete in today's challenging and tight job market. The non-profit faith-based community action agency offers internship trainings as well as classroom instruction. Classes are tailored to meet the needs of people in the [facility] region. [place], [place] [address], [phone]  Foreclosure prevention/Debt Services Family Services of the [employer] Credit Counseling Service inludes debt and foreclosure prevention programs for local families. This includes money management, financial advice, budget review and development of a written action plan with a Pensions consultant to help solve specific individual financial problems. In addition, housing and mortgage counselors can also provide pre- and post-purchase homeownership counseling, default resolution counseling (to prevent foreclosure) and reverse mortgage counseling. A Debt Management Program allows  people and families with a high level of credit card or medical debt to consolidate and repay consumer debt and loans to creditors and rebuild positive credit ratings and scores. Contact ([phone] F1555895.  Community clinics in [place] -Health Department [facility]: [address] [place], [place], [address]. [phone].  -Health Department [place] [facility]: [phone]  [address], [facility], [address]. [phone].  -[facility] offers medical care through a group of doctors, pharmacies and other healthcare related agencies that offer services for low income, uninsured adults in [place]. Also offers adult Dental care and assistance with applying for an [employer]. Call [phone].   [facility]. This center provides low-cost health care to those without health insurance. Services offered include an onsite pharmacy. Phone [phone]. [address], [place].  -Medication Assistance Program serves as a link between pharmaceutical companies and patients to provide low cost or free prescription medications. This service is available for residents who meet certain income restrictions and have no insurance coverage. PLEASE CALL [phone] Jonette Nestle) OR [phone] ([place])  -One Step Further: Materials engineer, The [employer] Support & Nutrition Program, [employer]. Call ([phone]/ [phone].  Food pantry and assistance -Urban Ministry-Food Bank: [address], [place] [address]. Phone [phone]  -Blessed Table Food Pantry: [address] [place], [place] [address]. [phone].  -Missionary Ministry: has the purpose of visiting the sick and shut-ins and provide for needs in the surrounding communities. Call [phone]. Email: [email or web address] This program provides: Food box for seniors, Financial assistance, Food to meet basic  nutritional needs.  -Meals on Wheels with Senior Resources: [facility] residents age 40 and over who are homebound and unable to obtain and prepare a nutritious meal for themselves are eligible for this service. There may be a waiting list in certain parts of [facility] if the route in that area is full. If you are in [facility] and [place] call [phone] to register. For all other areas call [phone] to register.  -Greater Dietitian: https://findfood.BargainContractor.si  TRANSPORTATION: -[employer] Department of Health: Call [facility] and [employer] at [phone] for details. AttractionGuides.es  -Access [place]: Access [place] is the [employer] Agency's shared-ride transportation service for eligible riders who have a disability that prevents them from riding the fixed route bus. Call [phone]. Access [place] riders must pay a fare of $1.50 per trip, or may purchase a 10-ride punch card for $14.00 ($1.40 per ride) or a 40-ride punch card for $48.00 ($1.20 per ride).  -The Shepherd's WHEELS rideshare transportation service is provided for senior citizens (60+) who live independently within [place] city limits and are unable to drive or have limited access to transportation. Call [phone] to schedule an appointment.  -[place] Transportation: For Medicare or Medicaid recipients call [phone]?[facility], wheelchair Carloyn Chi, and ambulatory quotes available.   FLEEING VIOLENCE: -Family Services of the [place]- 24/7 Crisis line 2026073550) -[facility] Centers: (336) 641-SAFE [phone])  [place] 2-1-1 is another useful way to locate resources in the community. Visit ShedSizes.ch to find service information online. If you need additional assistance, 2-1-1 Referral Specialists are available 24 hours a day, every day by dialing  2-1-1 or [phone] from any phone. The call is free, confidential, and available in any language.  Affordable Housing Search http://www.nchousingsearch.[facility])   M-F 8a-3p [address]  [place], [place] [address] [phone] Services include: laundry, barbering, support groups, case management, phone & computer access, showers, AA/NA mtgs, mental health/substance abuse nurse, job skills class, disability information, VA assistance, spiritual classes, etc. Winter Shelter available when temperatures are less than 32 degrees.   HOMELESS SHELTERS [facility] at [facility]- Call [phone] ext. [phone]  or ext. [phone]. Located at [address] [place], [place] [address]  [facility] Mens Shelter- Call [phone]. Located at [address] [place] [address].  [facility]- [employer]. Call [phone]. Office located at [address], [place] [address].  Pathways Family Housing through [place] [phone].  [facility] Family Shelter- Call [phone]. Located at [address] [place], [place], [place] [address].  Room at the Inn-For Pregnant mothers. Call [phone]. Located at [address] [place], [address].  [place] [facility]-For men in [place]. Call [phone]. [address] Place-Shelter in [place]. Call [phone].  Home of [employer] for [employer] [phone]. Office located at [address], [place], [address].  [employer] be agreeable to help with chores. Call [phone] ext. [phone].  Men's: [address] [place], [place] [address]. Women's: [facility]  [address] [place], [place] [address]  Crisis Services [facility] [place] Crisis Management- [phone]  [facility] [address] [place], [place] [address]. Phone: [phone]   [facility] Address: [address] [place], [place] [address] Phone Number: [phone] Hours of Operation: Residents of [place] can come to obtain food Monday through Friday from 8:30am until 3:30pm. Photo ID and Social Security cards required for all residents of a household. Can come six times a year  THE BLESSED TABLE Address: [address] [place], [place] [address] Phone Number: [phone] Hours of Operation: Operates Tuesday-Friday 10:00 a.m. to 1 p.m. Requirements: Referral from DSS needed. May come 6 times a year, 30 days apart. Photo ID and SS required for all residents of household.  [facility] Address: [address] [place], [place] [address] Phone Number: [phone] Hours of Operation: Food pantry is open on the last Saturday of each month from 10:00 am - 12:00 noon. No appointment needed. No qualifications.  [facility] Address: [address] [place], [place] [address] Phone Number: [phone]. [phone] Hours of Operation: Must make reservations to pick up food on Saturdays. Sign ups for Saturday pick up beginning at 8:30 a.m. on Monday morning.  [facility]. [facility] F[facility] Address: [address] [place], [place] [address] Phone Number: [phone] Hours of Operation: If you need food, bring proper identification such as a driver's license to receive a bag of food once a month. Requirements: Can come once every 30 days with referral DSS, Holiday representative, Mental health etc. Each referral good for six visits. Photo ID required. *1st visit no referral required.  [facility] Address: [address] [place], [place] [address] Phone Number: [phone]  [place] [facility] Address: [address] [place], [place] [address] Phone Number: [phone] Hours of Operation:  You can register at [email or web address].[email or web address]/ for free groceries  FREE INDEED FOOD PANTRY Address: [address]  [place], [place] [address] Phone Number: [phone] Hours of Operation: Drive through giveaway, first come first served. Every 3rd Saturday 11AM - 1PM  [facility] [address] Address: [address] [place] [address] Phone Number: [phone]   [place]  HAND TO HAND FOOD PANTRY Address: [address] [facility] [address] Veola Giovanni [place], [place] [address] Phone Number: [phone] Hours of Operation: Once a month every 3rd Saturday  [facility] Address: [address] [place], [place] [address] Phone Number: [phone] Hours of Operation: Distribution happens from 9:00-10:00 a.m. every Saturday.     HELPING HANDS Address: [address] [place], [place] [address] Phone Number: [phone] Hours of Operation: ONCE a week for the community food distribution held every Tuesday, Wednesday and Thursday from 11 a.m. - 2:00 p.m. Food is available on a first come, first serve basis and varies week to week. No appointment necessary for drive thru pick up.  [facility] Address: [address] [place], [place] [address] Phone Number: [phone] Hours of Operation: Open every 3rd Thursday 9:30 a.m. - 11:00 a.m.  [facility] Address: [address] [place], [place] [address] Phone Number: [phone] Hours of Operation: Please call for hours, directions, and questions  GREATER [place] FOOD ALLIANCE Address: [address], [place], [place]  [address] Phone Number: [phone] Website: https://www.Hollyguns.co.za Food Finder app: https://findfood.ghpfa.org  [facility] Address: [address] [place], [place] [address] Phone Number: [phone] Hours of Operation: Contact Bree Harpe. Enrolled Substance Abuse Clients Only  [facility] Address: [address] [place], [address]  Phone Number: [phone] Hours of Operation: Contact Alene Ana. Food pantry open the 3rd Saturday of each month from 9 a.m. -12 p.m.  only  [place] [facility] Address: [address] [place], [place] [address] Phone Number: [phone] Hours of Operation: Contact Loletta Ripple. Emergency food bank open on Saturdays by appointment only  [facility] Address: [address] [place], [place] [address] Phone Number: [phone] Hours of Operation: No specific contact person; Anyone can help  [facility] Address: [address] [place], [place] [address] Phone Number: [phone] Hours of Operation: Contact Julia Oats. Agency gives out a bag of food every Thursday from 2-4 p.m. only, and also provides a community meal every Thursday between 5-6 p.m. Other services provided include rent/mortgage and utility assistance, women's winter shelter, thrift store, and senior adult activities.  [facility] [place] Address: [address] [place], [place] [address] Phone Number: [phone] Hours of Operation: The Emergency Food Assistance Program provides individuals and families with a generous supply of food including meat, fresh vegetables, and nonperishable items. The food box contains five days' worth of food, and each family or individual can receive a box once per month. M, W, Th, Fr 11am-2pm, walk-ins welcome.  [facility] Address: [address] [place], [place] [address]  Phone Number: [phone] Hours of Operation: Contact Asia Blanca Bunch. Tuesdays and Thursdays from 11am - 3pm by appointment only  Homeless Shelter List:  [name]) [address] [place], [place]  Phone: [phone]  [facility] Men's [facility]ter [address] [place], [place] [address] Phone: [phone]  [facility] (Women only) [address]Margarita Shear [place], [place] [address] Phone: [phone]  [facility] [address], [place] [address] Phone:  [phone]  [facility] of Hope: [phone]. [address] [place], [place] [address] Phone: [phone]  [facility] Overflow Shelter  [address] [place], [place] [address] (Check in at 6:00PM for placement at a local shelter) Phone: [phone]  Rent/Utility Assistance in [facility]:  INNOVATIVE PATHWAYS [address] [place], [place] [address] [phone] Mon 8:00am - 6:00pm; Tue 8:00am - 6:00pm; Wed 8:00am - 6:00pm; Thu 8:00am - 6:00pm; Fri 8:00am - 6:00pm; Email: [email or web address] Eligibility: Residents of [place], [place], [place], [place], [place] and [place] that meet income limits. Call or text for eligibility screening.   [place]  [facility]IST[facility] [address] [place], [place], [place] [address] [phone] (Main: Rental Assistance) [phone] (Main: Utility Assistance) Mon 8:30am - 5:00pm; Tue 8:30am - 5:00pm; Wed 8:30am - 5:00pm; Thu 8:30am - 5:00pm; Fri 8:30am - 5:00pm; Website: http://www.greensborourbanministry.org/emergency-assistance-program Eligibility: People who have an unexpected crisis or emergency that can be verified. Must have some form of income and meet income limits. At the first of the month, only helps with rent/mortgage assistance for those who have court ordered eviction notices. Call for application information. Call for exact documents that will be needed. Examples of documents that may be needed: Photo ID, Social Security cards for everyone in the household, and proof of income for previous 2 months. Copy of eviction notice for rent assistance and copy of final notice for utility assistance. Statements or receipts of bills for previous 2 months.   [employer] - [place] [address] [place], [place] [address] [phone] (Main) [phone] (Alternate) Mon 9:00am - 5:00pm; Tue 9:00am - 5:00pm; Wed 9:00am - 5:00pm; Thu 9:00am - 5:00pm; Fri 9:00am - 5:00pm; Website:  http://[email or web address].[email or web address]/[place]/emergency-financial-assistance Email: [email or web address] Eligibility: People experiencing a housing crisis with past-due rent and/or utilities and meet income limits. Must be willing to take part in 6 Call or visit website to download application. Return complete application by mail or email only. Documents: Help with Utilities: Photo ID, proof of household income, copies of monthly bills or receipts, and a final disconnection/shut-off notice. Help with Rent or Mortgage: Photo ID, proof of income, copies of monthly bills or receipts, and eviction notice. Help with Household Goods: Photo ID, proof of household income, copies of monthly bills or receipts, and a fire or flood report.  [facility] - [place] [address] [place], [place] [address] [phone] (Main) Mon 8:00am - 5:00pm; Tue 8:00am - 5:00pm; Wed 8:00am - 5:00pm; Thu 8:00am - 5:00pm; Fri 8:00am - 12:00pm; Website: http://[email or web address].[email or web address]/[place]/emergency-financial-assistance Email: [email or web address] Call for eligibility information. Apply :Utilities Assistance: Visit office by 8:30am on 1st and 4th Monday of each month to pick up application. Rent and Mortgage Assistance: Visit office by 8:30am on 2nd and 3rd Monday of each month to pick up application. NOTE: If Monday falls on a holiday applications can be picked up the following Tuesday. Documents required will be listed on application.  [facility] [phone] (Main) Seen by appointment only. Call for more information. Eligibility: Meet income limits. Apply: Call for information on how to schedule an appointment. Each month there is a specific day to call to schedule an appointment. It is stated on the agency voicemail message. Appointments fill up quickly each month. Documents: Photo ID, copy of current utility bill.  [facility]  HANDS [place] [address], [place], [place] [address] [phone] (Main) Tue 9:00am - 4:00pm; Wed 9:00am - 4:00pm; Thu 9:00am - 4:00pm; Website: http://[email or web address].[email or web address] Email: [email or web address] Eligibility: Utility Assistance: Meet income limits and be a Holiday representative. [employer] customers do not qualify. Must not have received utility assistance for another agency within the last 90 days. Rent Assistance: Residents of [employer] who meet income limits. Must not have received rent assistance for another agency within the last 90 days. Apply: Call to schedule an appointment. Documents: Utility Assistance: Photo ID, City of [employer], copy of lease (if not paying a mortgage), proof of income, and monthly expenses. Rent Assistance: Photo ID, W-9 from the landlord, copy of the lease, proof of income,  and a list of monthly expenses.  [facility] [place] [address], [place], [place] [address] [phone] (Main: Help With Rent) [phone] (Main: Help With Utilities) Mon 9:00am - 4:00pm; Tue 9:00am - 4:00pm; Wed 9:00am - 4:00pm; Thu 9:00am - 4:00pm; Fri 9:00am - 4:00pm; Website: [email or web address].no Email: [email or web address] Eligibility: People experiencing a financial crisis. Apply: Call to schedule an appointment Wednesday, 7:30am. Documents: Photo ID, Social Security card, proof of income, and proof of address. Other documents may be required, depending on service. Call for more information.  LOW INCOME ENERGY ASSISTANCE PROGRAM DEPARTMENT OF SOCIAL SERVICES - [facility] [address] [place], [place] [address] [phone] (Main) Mon 8:00am - 5:00pm; Tue 8:00am - 5:00pm; Wed 8:00am - 5:00pm; Thu 8:00am - 5:00pm; Fri 8:00am - 5:00pm; Website:  [email or web address] Eligibility: Meet income limits and resource guidelines. Each household is only eligible once, even if multiple members apply. Apply: Call to see if funds are available. Visit to complete an application, call to have 1 mailed, or apply online at [email or web address] NOTE: Households with a person age 25 and over or a person with a documented disability can apply beginning December 1. Other households can apply beginning January 1. Documents: Photo ID, birth certificate, proof of household income, copy of utility bill, latest bank statement, the names and Social Security numbers for everyone in the household, and proof of disability if under age 73.  LOW INCOME ENERGY ASSISTANCE PROGRAM DEPARTMENT OF SOCIAL SERVICES - [facility] [address] [place], [place], [place] [address] [phone] (Main) Mon 8:00am - 5:00pm; Tue 8:00am - 5:00pm; Wed 8:00am - 5:00pm; Thu 8:00am - 5:00pm; Fri 8:00am - 5:00pm; Website: [email or web address] Eligibility: Meet income limits and resource guidelines. Each household is only eligible once, even if multiple members apply. Apply: Call to see if funds are available. Visit to complete an application, call to have 1 mailed, or apply online at [email or web address] NOTE: Households with a person age 60 and over or a person with a documented disability can apply beginning December 1. Other households can apply beginning January 1. Documents: Photo ID, birth certificate, proof of household income, copy of utility bill, latest bank statement, the names and Social Security numbers for everyone in the household, and proof of disability if under age 54.  [employer] Guide Inpatient Behavioral Health/Residential Substance Abuse Treatment - Adults The United Way's "[id number]" is a great source of information about community services  available.  Access by dialing 2-1-1 from anywhere in [place] , or by website -  PooledIncome.pl.    (Updated 03/2016)   Crisis Assistance 24 hours a day     Services Offered       Area [employer] 24-hour crisis assistance: [phone] [place], [place]  [facility] 24-hour crisis assistance:4584900554 [place], [place]  [name] 24-hour crisis assistance: [phone] [place], [place]  [facility] Access to Care Line 24-hour crisis assistance; (718) [phone] All  [facility] 24-hour crisis response line: [phone] All    Other Local Resources (Updated 01/2016)   Inpatient Behavioral Health/Residential Substance Abuse Treatment Programs     Services          Address and Phone Number  [facility] ([facility])   14-day residential rehabilitation  [phone] 100 [address] [place], [place]  [facility] ([facility])    Detox - private pay only 14-day residential rehabilitation -  [place], insurance, private pay only [phone], or [phone] [employer]  [address], [place], [place] [address]   [facility] only Multiple facilities [phone] admissions    [facility] ([facility])   90-day program Must be homeless to participate   [phone], or [phone] Jayson Michael, [facility]  [facility] only [phone], or  [phone] [address] [place], [place] [address]  [facility] Residential Treatment Services Must make an appointment Accepts private pay, [place] Areola [facility] [phone]  [address] [place], [place] [address]   [facility] Females only Associated with the [facility] [phone] [address] [place], [place] [address]  [facility] only [phone], or [phone] [address] [place], [place]   [facility]     Detox Residential rehabilitation Private insurance only Multiple locations [phone] admissions  [facility]     Private pay Private insurance [phone] [address] [place], [place] [address]  [facility]     Males only Fee required at time of admission [phone] [address] [place], [place] [address]  [facility]     Private pay only   [phone] [address] [place], [place]  RTS (Residential Treatment Services)    Detox - private pay, Medicaid Residential rehabilitation for males  - Medicare, Medicaid, insurance, private pay [phone] [address] [place], [place]   TOIZT              Walk-in interviews Monday - Saturday from 8 am - 4 pm Individuals with legal charges are not eligible [phone] [address] [place], [place] [address]  [facility]  Must be willing to work Must attend Alcoholics Anonymous meetings [phone] [address] [place], [place]   [facility]    Faith-based program Private pay only [phone] [address] [place], [place]    In a time of Crisis:   - [facility]   Mobile Crisis Management provides immediate crisis response, 24/7.   Call [phone]   [facility] for MH/DD/SA Services?Access Center is available 24 hours a day, 7 days a week. Customer Service Specialists will assist you to find a crisis provider that is well-matched with your needs. Your local number is:?[phone]   - [facility])  OP, individual counseling, medication management  [address] [place], [place] [address]  ; [phone]  Call for intake hours; Medicaid and Uninsured     Outpatient Providers    [facility] ([facility])  Group and individual counseling.  [address]   [place], [place] [address]  [phone]  [place]: [phone]   [place]: [phone]  Medicaid and uninsured.     [facility]  Offers IOP groups multiple times per week.  [address] Zada [place]lich [place], [place] [address]  [phone]  Takes Medicaid and other insurances.     [facility]   Chemical Dependency Intensive Outpatient Program (IOP)  [address]  [place], [place] [address]  [phone]  Takes Nurse, learning disability and [place].     [facility]   IOP and Partial Hospitalization Program   [address]   [place], [place] [address]  [phone]  Private Insurance, [place] only for partial hospitalization    [facility]  [address], [place], [place] [address]  [phone]  Monday-Friday. Short  and Long term options.  [facility]al Health Urgent Care ([facility])  IOP, individual counseling, medication management  [address] [place], [place] [address]  [phone]  Medicaid and [facility]    [facility]  [address]   [place], [place] [address]  [phone]  Private Insurance and Self Pay     [facility]  [address]   [place], [place] [address]  [phone]  Private Insurance, [place], and Self Pay     [facility]   [address] [place], [place] [address]  [facility]   [address]   [place], [place] [address]  [phone]    [facility]   [address]  [place], [place] [address]  [phone]          Residential Treatment Programs    [facility] ([facility]  [address]  [place], [place] [address]  [phone] or [phone]  Detox and Residential Rehab 21 days  (Medicaid, private insurance, and self pay. If Medicare, will look into funding). No methadone. Call for pre-screen.     [facility])  [address]   [place], [place] [address]   [phone]  Detox 3-7 days (self Pay and Medicaid Limited availability). Transitional Program for females needs 60 days clean first.   Rehab Only for Males (Medicare, Medicaid, and Self Pay)-No methadone.    [facility] [address]  [address]  [place], [place] [address]  [phone] or [phone]  Private Insurance only    [facility]  PHONE:?[phone]  FAX:?[phone]  Residential program for women 21 and over for up to a year through a Christian 12-step recovery model.  Self-pay.      [facility]  [address] [place], [place] [address]  Phone: ?[phone]  Must be detoxed 72 hours prior to admission; 28 day program.   Self-pay.    [facility]  [address]   [place], [place]  [phone]  [employer], Medicare, [place] (not straight [place]). They offer assistance with transportation.     [facility]  [address] [place],   [place], [place] [address]  [phone]  Christian Based Program. Men only.  No insurance    [facility] is a substance use disorder treatment program for women, including those who are pregnant, parenting, and/or whose lives have been touched by abuse and violence.  [phone]    [facility]  [address] [place], [place] [address]  Women's: [phone]  Men's: [phone]  No Medicaid.     [facility]  Locations across the [place] (mainly [place] ) willing to help with transportation.   [phone] [employer].  [facility]   [address]   [place], [place] [address]  [phone]  Treatment Only, must make assessment appointment, and must be sober for assessment appointment. Self pay, [facility], must be [facility] resident. No methadone.     [facility]   [address]  [place], [place] [address]  [phone]  No pending legal charges, Long-term  work program. No methadone. Call for assessment.    [facility]   [address],  [place], [place] [address]  [phone] or [phone]  Commercial Insurance Only    [facility] [employer]  Local - [phone]) [phone]  Admissions--[phone]  Private Insurance (no Medicaid).  Males/Females, call to make referrals, multiple facilities.     [facility]  [address]   [place], [place] [address]  ?([place]  Men Only Upfront Fee  [facility]  Women's Program: [facility]  [address]  [place], [place] [address]  [phone]    [facility]-no methadone:  [facility]  [address]  [place], [place] [address]  [phone] (f)    [facility]  [address] [place], [place] [address]  [phone] [facility]  [phone]  [place], [place]  For women, houses 8 residents for sober living. No Medicaid.               AA Meetings   Meeting Locator:  [email or web address]  Also can download an app on that website.    Syringe Services Program   Due to COVID-19, syringe services programs are likely operating under different hours with limited or no fixed site hours. Some programs may not be operating at all. Please contact the program directly using the phone numbers provided below to see if they are still operating under COVID-19.   [facility] Solution to the Opioid Problem (GCSTOP)  Fixed; mobile; peer-based  Amos Balint  [phone]  [email or web address]   Fixed site exchange at [facility], [address]  [place], [place] [address] on Wednesdays (2:00 - 5:00 pm) and Thursdays (4:00 - 8:00 pm).  Pop-up mobile exchange locations:  [employer], [address] [place], [place] [address] on Tuesdays (11:00 am - 1:00 pm) and Fridays (11:00 am - 1:00 pm)   -[facility]  Project?-?[address], [place], [place] [address] on Tuesdays (2:00 - 4:00 pm) and Fridays (2:00 - 4:00 pm)   -[facility]?- also serves [place] and [employer]  [facility]  Fixed; mobile; peer-based  Adel Aden  [phone]  [email or web address]  [address] [place],  [address]  Delivery and outreach available in [place] and [place], please call for more information. Monday, Tuesday: 1:00 -7:00 pm  Thursday: 4:00 pm - 8:00 pm  Friday: 1:00 pm - 8:00 pm)   Medication-Assisted Treatment (MAT)   -[place]- services [address] and surrounding areas including [place], [place], [place], [place], [address], [place], [place], [place], [place], and [place], [place].   Options include Methadone, buprenorphine or Suboxone.   [address], [place]  [place], [place] [address]  Phone: [phone]   Mon - Fri: 5:30am - 2:00pm  Sat: 5:30am -7:30am  Sun: Closed  Holidays: 6:00am - 8:00am    -[facility] [place]- We use FDA-approved medications, like methadone/suboxone/sublocade, and vivitrol. These medications are then combined with customized care plans that include individual or group counseling, toxicology, and medical care directed by on-site physicians. Accepts most insurance plans, Medicaid, and private pay.    [address]  [place], [place] [address]  Phone:?[phone]   Monday-Friday 5:00 AM - 10:00 AM  Saturday  6:00 AM - 8:30 AM  Sunday 6:00 AM - 7:00 AM    -[facility]- [facility] is a treatment & recovery focused program. In addition to receiving methadone medication, our clients participate in individual and group counseling as well as random drug testing. If accepted into the [facility] Opioid Program, you will be provided several intake appointments and  a physical exam   [address]  [place], [place] [address]  Office: [phone]  Fax: [phone]     -[facility]- We put our community members at the center of everything we do, for remote treatment services as well as in-person, from alcohol withdrawal to opioid use and more.?   [address] [place] [address], [place], [place] [address]  [phone]   Monday-Wednesday:?9:00am - 5:00pm  Thursday:?9:00am - 6:00pm  Friday:?9:00am - 5:00pm  Saturday:?9:00am - 1:00pm  Sunday:?Closed      -[facility]- Our clinic in [place], a medication unit, offers daily dosing of methadone or buprenorphine in addition to our clinic in [place] offering counseling to help people overcome addiction to heroin and other opiates. We also offer psychiatric services including medication management, and an office based suboxone program.   [address], [place], [place] [address]  Phone [phone]    [facility]-We treat Opioid Addiction using medications that are a combination of buprenorphine and naloxone, which are used to treat adults addicted to narcotic painkillers and drugs such as heroin. They reduce the intense cravings and painful symptoms that accompany withdrawal.   [address] [place], [place]  Phone: [phone]    [facility])  [address] [place], [place] [address]  [phone]   [place]  [phone]  [address] [place], [place] [address]   M-W??? 5:00am-12:00pm  Thu???? 5:00am-10:00am  Fri?????? 5:00am-12:00pm  Sat????? 5:00am-8:00am  Sun???? Closed   $12/daily for Methadone Treatment.

## 2024-04-10 DIAGNOSIS — L03119 Cellulitis of unspecified part of limb: Secondary | ICD-10-CM | POA: Diagnosis not present

## 2024-04-10 LAB — CBC WITH DIFFERENTIAL/PLATELET
Abs Immature Granulocytes: 0 10*3/uL (ref 0.00–0.07)
Basophils Absolute: 0 10*3/uL (ref 0.0–0.1)
Basophils Relative: 1 %
Eosinophils Absolute: 0.3 10*3/uL (ref 0.0–0.5)
Eosinophils Relative: 11 %
HCT: 36.9 % — ABNORMAL LOW (ref 39.0–52.0)
Hemoglobin: 13 g/dL (ref 13.0–17.0)
Immature Granulocytes: 0 %
Lymphocytes Relative: 49 %
Lymphs Abs: 1.5 10*3/uL (ref 0.7–4.0)
MCH: 33.8 pg (ref 26.0–34.0)
MCHC: 35.2 g/dL (ref 30.0–36.0)
MCV: 95.8 fL (ref 80.0–100.0)
Monocytes Absolute: 0.5 10*3/uL (ref 0.1–1.0)
Monocytes Relative: 17 %
Neutro Abs: 0.7 10*3/uL — ABNORMAL LOW (ref 1.7–7.7)
Neutrophils Relative %: 22 %
Platelets: 412 10*3/uL — ABNORMAL HIGH (ref 150–400)
RBC: 3.85 MIL/uL — ABNORMAL LOW (ref 4.22–5.81)
RDW: 13.7 % (ref 11.5–15.5)
Smear Review: NORMAL
WBC: 3 10*3/uL — ABNORMAL LOW (ref 4.0–10.5)
nRBC: 0 % (ref 0.0–0.2)

## 2024-04-10 LAB — CULTURE, BLOOD (ROUTINE X 2)
Culture: NO GROWTH
Special Requests: ADEQUATE

## 2024-04-10 LAB — BASIC METABOLIC PANEL WITH GFR
Anion gap: 7 (ref 5–15)
BUN: 14 mg/dL (ref 6–20)
CO2: 24 mmol/L (ref 22–32)
Calcium: 9.6 mg/dL (ref 8.9–10.3)
Chloride: 103 mmol/L (ref 98–111)
Creatinine, Ser: 0.77 mg/dL (ref 0.61–1.24)
GFR, Estimated: >60 mL/min (ref >60)
Glucose, Bld: 98 mg/dL (ref 70–99)
Potassium: 4.4 mmol/L (ref 3.5–5.1)
Sodium: 134 mmol/L — ABNORMAL LOW (ref 135–145)

## 2024-04-10 LAB — VANCOMYCIN, PEAK: Vancomycin Pk: 23 ug/mL — ABNORMAL LOW (ref 30–40)

## 2024-04-10 MED ORDER — FERROUS SULFATE 325 (65 FE) MG PO TBEC
325.0000 mg | DELAYED_RELEASE_TABLET | Freq: Every day | ORAL | Status: DC
  Filled 2024-04-10: qty 1

## 2024-04-10 MED ORDER — FERROUS SULFATE 325 (65 FE) MG PO TABS
325.0000 mg | ORAL_TABLET | Freq: Every day | ORAL | Status: DC
  Administered 2024-04-10 – 2024-04-23 (×14): 325 mg via ORAL
  Filled 2024-04-10 (×13): qty 1

## 2024-04-10 NOTE — Progress Notes (Signed)
 OT Cancellation Note  Patient Details Name: Mark Galloway MRN: [id number] DOB: [birth year]   Cancelled Treatment:    Reason Eval/Treat Not Completed: Other (comment) Pt with lunch on initial OT attempt. Pt verbalized understanding of previously educated scar massage/ROM exercises. Encouraged pt to complete these over the weekend. Will follow up at a later date.   Lorre Munroe 04/10/2024, 1:21 PM

## 2024-04-10 NOTE — Plan of Care (Signed)

## 2024-04-10 NOTE — Progress Notes (Signed)
 Progress Note   Patient: Mark Galloway RUE:[id number] DOB: [birth year] DOA: 04/05/2024     4 DOS: the patient was seen and examined on 04/10/2024   Brief hospital course: 38 year old man with PMH of HIV on ART, bipolar disorder, recent right little finger infection who presented with worsening pain and swelling of the right little finger and is being treated for osteomyelitis.   Assessment and Plan:  Osteomyelitis of right little finger and Cellulitis of the right hand lateral  Patient recently treated for infection of right little finger. X-rays on presentation concerning for osteomyelitis. Patient has been seen by orthopedic surgery, Infectious Disease. Input appreciated.  US done 4/10 showed no discrete fluid collection. - Continue Ertapenem, vancomycin.  -Per ID, given patient's ESBL E. coli, he will need continued therapy with ertapenem for 4 to 6 weeks. - Patient does not appear to be a candidate for a PICC line, given use of IV methamphetamine documented by ED provider on 02/21/2024.  Mar[place] Kitchen HIV/AIDS  Last CD4 count was 163 on March 15, 2024.   Last viral load was 80.   -Continue home Biktarvy. -Started Bactrim per ID.  Macrocytic anemia  Iron panel consistent with iron deficiency, with low ferritin, elevated TIBC. 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 -For iron supplementation as outpatient.  Hypertension mentioned in the chart.   - Will follow blood pressure trends with as needed IV hydralazine.   -Was taking propranolol 10 mg twice daily in the hospital last month not sure if he is still taking it.       Subjective: Patient is feeling well.   Physical Exam: Vitals:   04/09/24 1722 04/09/24 1916 04/10/24 0353 04/10/24 0948  BP: 120/72 127/80 108/60 104/70  Pulse: 75 (!) 106 80 79  Resp:  16 16   Temp: 98 F (36.7 C) 98.8 F (37.1 C) 98 F (36.7 C) 98.5 F (36.9 C)  TempSrc:  Oral Oral Oral  SpO2: 100% 98% 100% 97%  Weight:      Height:         General: Alert,  oriented X3  Eyes: Pupils equal, reactive  Oral cavity: moist mucous membranes  Head: Atraumatic, normocephalic  Neck: supple  Chest: clear to auscultation. No crackles, no wheezes  CVS: S1,S2 RRR. No murmurs  Abd: No distention, soft, non-tender. No masses palpable  Extr: No edema   MSK: Swelling of right little finger, contracture, open granulating wound on palmar aspect Neurological: Grossly intact.      Data Reviewed:     Latest Ref Rng & Units 04/10/2024    5:26 AM 04/06/2024    6:36 AM 04/05/2024    6:26 PM  CBC  WBC 4.0 - 10.5 K/uL 3.0  6.1  5.4   Hemoglobin 13.0 - 17.0 g/dL 14.7  82.9  56.2   Hematocrit 39.0 - 52.0 % 36.9  30.7  32.2   Platelets 150 - 400 K/uL 412  309  316       Latest Ref Rng & Units 04/10/2024    5:26 AM 04/09/2024    5:35 AM 04/08/2024    4:55 AM  BMP  Glucose 70 - 99 mg/dL 98  130  865   BUN 6 - 20 mg/dL 14  11  11    Creatinine 0.61 - 1.24 mg/dL 7.84  6.96  2.95   Sodium 135 - 145 mmol/L 134  133  134   Potassium 3.5 - 5.1 mmol/L 4.4  4.4  4.1   Chloride 98 -  111 mmol/L 103  103  104   CO2 22 - 32 mmol/L 24  22  22    Calcium 8.9 - 10.3 mg/dL 9.6  9.6  9.0      Family Communication: n/a  Disposition: Status is: Inpatient Remains inpatient appropriate because: Requires IV antibiotics for treatment of his finger infection.  Planned Discharge Destination: Home DVT ppx: SCDs     Time spent: 40 minutes  Author: Marcine Matar, MD 04/10/2024 4:35 PM  For on call review [email or web address].

## 2024-04-10 NOTE — Plan of Care (Signed)
 Pt alert and oriented x4. Ambulatory within the room. No complaints of pain. Tolerating po intake. Drsg to rt pinky finger removed. Finger cleansed and new drsg applied. The bed of the wound is pink; no drainage noted.tolerating Vancomycin IV. Encouraged pt to call for assistance as needed. Call light in reach. Sr x2 elevated. Bed in low position.

## 2024-04-11 DIAGNOSIS — L03119 Cellulitis of unspecified part of limb: Secondary | ICD-10-CM | POA: Diagnosis not present

## 2024-04-11 LAB — BASIC METABOLIC PANEL WITH GFR
Anion gap: 9 (ref 5–15)
BUN: 14 mg/dL (ref 6–20)
CO2: 23 mmol/L (ref 22–32)
Calcium: 9.7 mg/dL (ref 8.9–10.3)
Chloride: 102 mmol/L (ref 98–111)
Creatinine, Ser: 0.76 mg/dL (ref 0.61–1.24)
GFR, Estimated: >60 mL/min (ref >60)
Glucose, Bld: 112 mg/dL — ABNORMAL HIGH (ref 70–99)
Potassium: 4 mmol/L (ref 3.5–5.1)
Sodium: 134 mmol/L — ABNORMAL LOW (ref 135–145)

## 2024-04-11 LAB — VANCOMYCIN, TROUGH: Vancomycin Tr: 11 ug/mL — ABNORMAL LOW (ref 15–20)

## 2024-04-11 NOTE — Progress Notes (Signed)
 Pt rested at intervals. Tolerating Vancomycin IVPB. No c/o pain to rt hand. Call light in reach. Sr x2 elevated. Bed in low position.

## 2024-04-11 NOTE — Plan of Care (Signed)

## 2024-04-11 NOTE — Progress Notes (Signed)
 Progress Note   Patient: Mark Galloway ZOX:[id number] DOB: 09/05/1986 DOA: 04/05/2024     5 DOS: the patient was seen and examined on 04/11/2024   Brief hospital course: 38 year old man with PMH of HIV on ART, bipolar disorder, recent right little finger infection who presented with worsening pain and swelling of the right little finger and is being treated for osteomyelitis.   Assessment and Plan:  Osteomyelitis of right little finger and Cellulitis of the right hand lateral  Patient recently treated for infection of right little finger. X-rays on presentation concerning for osteomyelitis. Patient has been seen by orthopedic surgery, Infectious Disease. Input appreciated.  US  done 4/10 showed no discrete fluid collection. - Continue Ertapenem, vancomycin.  -Per ID, given patient's ESBL E. coli, he will need continued therapy with ertapenem for 4 to 6 weeks.  End date for 6 weeks of treatment would be 05/17/2024. - Patient does not appear to be a candidate for a PICC line, given use of IV methamphetamine documented by ED provider on 02/21/2024.  Mark Galloway HIV/AIDS  Last CD4 count was 163 on March 15, 2024.   Last viral load was 80.   -Continue home Biktarvy. -Started Bactrim per ID.  Macrocytic anemia  Iron panel consistent with iron deficiency, with low ferritin, elevated TIBC. 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 - Continue ferrous sulfate.  Bipolar disorder - Continue home olanzapine.       Subjective: Patient is feeling well. I explained his plan of care, and that he would require a prolonged inpatient stay. He is on board with this plan .    Physical Exam: Vitals:   04/10/24 0948 04/10/24 2058 04/11/24 0502 04/11/24 0928  BP: 104/70 120/81 118/75 113/69  Pulse: 79 96 95 80  Resp:  17 18 18   Temp: 98.5 F (36.9 C) 98.4 F (36.9 C) 98.6 F (37 C) 98.6 F (37 C)  TempSrc: Oral Oral Oral Oral  SpO2: 97% 96% 98% 99%  Weight:      Height:         General: Alert, oriented X3   Eyes: Pupils equal, reactive  Oral cavity: moist mucous membranes  Head: Atraumatic, normocephalic  Neck: supple  Chest: clear to auscultation. No crackles, no wheezes  CVS: S1,S2 RRR. No murmurs  Abd: No distention, soft, non-tender. No masses palpable  Extr: No edema   MSK: Swelling of right little finger, contracture, open granulating wound on palmar aspect Neurological: Grossly intact.      Data Reviewed:     Latest Ref Rng & Units 04/10/2024    5:26 AM 04/06/2024    6:36 AM 04/05/2024    6:26 PM  CBC  WBC 4.0 - 10.5 K/uL 3.0  6.1  5.4   Hemoglobin 13.0 - 17.0 g/dL 81.1  91.4  78.2   Hematocrit 39.0 - 52.0 % 36.9  30.7  32.2   Platelets 150 - 400 K/uL 412  309  316       Latest Ref Rng & Units 04/11/2024    4:46 AM 04/10/2024    5:26 AM 04/09/2024    5:35 AM  BMP  Glucose 70 - 99 mg/dL 956  98  213   BUN 6 - 20 mg/dL 14  14  11    Creatinine 0.61 - 1.24 mg/dL 0.86  5.78  4.69   Sodium 135 - 145 mmol/L 134  134  133   Potassium 3.5 - 5.1 mmol/L 4.0  4.4  4.4   Chloride 98 - 111  mmol/L 102  103  103   CO2 22 - 32 mmol/L 23  24  22    Calcium 8.9 - 10.3 mg/dL 9.7  9.6  9.6      Family Communication: n/a  Disposition: Status is: Inpatient Remains inpatient appropriate because: Requires IV antibiotics for treatment of his finger infection.  Planned Discharge Destination: Home DVT ppx: SCDs     Time spent: 40 minutes  Author: MDALA-GAUSI, Garyn Arlotta AGATHA, MD 04/11/2024 3:51 PM  For on call review [email or web address].

## 2024-04-11 NOTE — Progress Notes (Signed)
 Pharmacy Antibiotic Note  Mark Galloway is a 38 y.o. male admitted on 04/05/2024 with  wound infection .  Pharmacy has been consulted for Vancomycin/Cefepime dosing.   Vanco level assessment: Dose admin: 4/11 13:58 Peak 23 mcg/mL 4/11 17:01 Trough 11 mcg/mL 4/11 21:38 cAUC 448 mcg*hr/mL cTrough: 10 mcg/mL  Plan: Continue Vancomycin 1250 mg IV q8h cAUC 448 mcg*hr/mL (trough 10 mcg/mL) Ertapenem 1g IV every 24 hours given hx of ESBL E. coli Trend WBC, temp, renal function  F/U infectious work-up Drug levels as indicated   Height: 5\' 9"  (175.3 cm) Weight: 74.8 kg (165 lb) IBW/kg (Calculated) : 70.7  Temp (24hrs), Avg:98.3 F (36.8 C), Min:98 F (36.7 C), Max:98.5 F (36.9 C)  Recent Labs  Lab 04/05/24 1826 04/06/24 0636 04/07/24 0555 04/08/24 0455 04/08/24 2038 04/08/24 2144 04/09/24 0535 04/10/24 0526 04/10/24 1701 04/10/24 2138  WBC 5.4 6.1  --   --   --   --   --  3.0*  --   --   CREATININE 0.71 0.63 0.65 0.72  --   --  0.66 0.77  --   --   LATICACIDVEN 1.2  --   --   --   --   --   --   --   --   --   VANCOTROUGH  --   --   --   --   --   --  7*  --   --  11*  VANCOPEAK  --   --   --   --    < > 28*  --   --  23*  --    < > = values in this interval not displayed.    Estimated Creatinine Clearance: 126.4 mL/min (by C-G formula based on SCr of 0.77 mg/dL).    Young Hensen, PharmD. Clinical Pharmacist 04/11/2024 2:08 AM

## 2024-04-12 DIAGNOSIS — L03119 Cellulitis of unspecified part of limb: Secondary | ICD-10-CM | POA: Diagnosis not present

## 2024-04-12 NOTE — Care Management (Signed)
 MATCH MEDICATION ASSISTANCE CARD Pharmacies please call: [phone] for claim processing assistance.  Rx BIN: [id number] Rx Group: [id number] Rx PCN: [id number] Relationship Code: 1 Person Code: 01  Patient ID (MRN): [id number]   Patient Name: Mark Galloway     Patient DOB: Jan 19, 1986   Discharge Date: 04/12/24    Expiration Date:04/20/24 (must be filled within 7 days of discharge)

## 2024-04-12 NOTE — Plan of Care (Signed)
 Pt alert and oriented x4. Out of bed and ambulated to the 1st floor of the hospital. No complaints voiced. Tolerating po intake. Encouraged to call for assistance as needed. Call light in reach. Sr x2 elevated. Bed in low position.

## 2024-04-12 NOTE — Progress Notes (Signed)
 Progress Note   Patient: Mark Galloway ZOX:[id number] DOB: 23-Nov-1986 DOA: 04/05/2024     6 DOS: the patient was seen and examined on 04/12/2024   Brief hospital course: 38 year old man with PMH of HIV on ART, bipolar disorder, recent right little finger infection who presented with worsening pain and swelling of the right little finger and is being treated for osteomyelitis.   Assessment and Plan:  Osteomyelitis of right little finger and Cellulitis of the right hand lateral  Patient recently treated for infection of right little finger. X-rays on presentation concerning for osteomyelitis. Patient has been seen by orthopedic surgery, Infectious Disease. Input appreciated.  US  done 4/10 showed no discrete fluid collection. - Continue Ertapenem, vancomycin.  -Per ID, given patient's ESBL E. coli, he will need continued therapy with ertapenem for 4 to 6 weeks.  End date for 6 weeks of treatment would be 05/17/2024. - Patient does not appear to be a candidate for a PICC line, given use of IV methamphetamine documented by ED provider on 02/21/2024.  Aaron Aas HIV/AIDS  Last CD4 count was 163 on March 15, 2024.   Last viral load was 80.   -Continue home Biktarvy. -Started Bactrim per ID.  Macrocytic anemia  Iron panel consistent with iron deficiency, with low ferritin, elevated TIBC. 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 - Continue ferrous sulfate.  Bipolar disorder - Continue home olanzapine.       Subjective: Patient is feeling well. Ambulating in room.    Physical Exam: Vitals:   04/11/24 0928 04/11/24 2111 04/12/24 0447 04/12/24 0744  BP: 113/69 121/68 116/71 120/60  Pulse: 80 97 83 78  Resp: 18 17 18 18   Temp: 98.6 F (37 C) 98.9 F (37.2 C) 97.9 F (36.6 C) 98 F (36.7 C)  TempSrc: Oral Oral Oral Oral  SpO2: 99% 99% 98% 99%  Weight:      Height:         General: Alert, oriented X3  Eyes: Pupils equal, reactive  Oral cavity: moist mucous membranes  Head: Atraumatic,  normocephalic  Neck: supple  Chest: clear to auscultation. No crackles, no wheezes  CVS: S1,S2 RRR. No murmurs  Abd: No distention, soft, non-tender. No masses palpable  Extr: No edema   MSK: Right little finger in dressing.  Neurological: Grossly intact.      Data Reviewed:     Latest Ref Rng & Units 04/10/2024    5:26 AM 04/06/2024    6:36 AM 04/05/2024    6:26 PM  CBC  WBC 4.0 - 10.5 K/uL 3.0  6.1  5.4   Hemoglobin 13.0 - 17.0 g/dL 81.1  91.4  78.2   Hematocrit 39.0 - 52.0 % 36.9  30.7  32.2   Platelets 150 - 400 K/uL 412  309  316       Latest Ref Rng & Units 04/11/2024    4:46 AM 04/10/2024    5:26 AM 04/09/2024    5:35 AM  BMP  Glucose 70 - 99 mg/dL 956  98  213   BUN 6 - 20 mg/dL 14  14  11    Creatinine 0.61 - 1.24 mg/dL 0.86  5.78  4.69   Sodium 135 - 145 mmol/L 134  134  133   Potassium 3.5 - 5.1 mmol/L 4.0  4.4  4.4   Chloride 98 - 111 mmol/L 102  103  103   CO2 22 - 32 mmol/L 23  24  22    Calcium 8.9 - 10.3 mg/dL 9.7  9.6  9.6      Family Communication: n/a  Disposition: Status is: Inpatient Remains inpatient appropriate because: Requires IV antibiotics for treatment of his finger infection.  Planned Discharge Destination: Home DVT ppx: SCDs     Time spent: 25 minutes  Author: MDALA-GAUSI, Yohannes Waibel AGATHA, MD 04/12/2024 2:46 PM  For on call review [email or web address].

## 2024-04-12 NOTE — Plan of Care (Signed)

## 2024-04-12 NOTE — Progress Notes (Signed)
 Pt has rested this shift. Tolerated po intake. No c/o pain. Afebrile. Tolerated IV antibiotic. Call light in reach. Sr x2 elevated. Bed in low position.

## 2024-04-12 NOTE — Plan of Care (Signed)
 Pt alert and oriented x4. Ambulatory within the room. No complaints voiced. Tolerating po and IV meds. Drsg intact to rt pinky finger. No acute distress. Call light in reach. Sr x2 elevated. Bed in low position.

## 2024-04-13 DIAGNOSIS — L03119 Cellulitis of unspecified part of limb: Secondary | ICD-10-CM | POA: Diagnosis not present

## 2024-04-13 NOTE — Progress Notes (Signed)
 Pt tolerating po intake. Medicated x1 for c/o pain @ his rt pinky finger. Voiced relief. Tolerating vancomycin IVPB. Call light in reach. Sr x2 elevated. Bed in low position.

## 2024-04-13 NOTE — Progress Notes (Signed)
 Progress Note   Patient: Mark Galloway NWG:[id number] DOB: 1986/05/17 DOA: 04/05/2024     7 DOS: the patient was seen and examined on 04/13/2024   Brief hospital course: 38 year old man with PMH of HIV on ART, bipolar disorder, recent right little finger infection who presented with worsening pain and swelling of the right little finger and is being treated for osteomyelitis.   Assessment and Plan:  Osteomyelitis of right little finger and Cellulitis of the right hand lateral  Patient recently treated for infection of right little finger. X-rays on presentation concerning for osteomyelitis. Patient has been seen by orthopedic surgery, Infectious Disease. Input appreciated.  US done 4/10 showed no discrete fluid collection. - Continue Ertapenem, vancomycin.  -Per ID, given patient's ESBL E. coli, he will need continued therapy with ertapenem for 4 to 6 weeks.  End date for 6 weeks of treatment would be 05/17/2024. - Patient does not appear to be a candidate for a PICC line, given use of IV methamphetamine documented by ED provider on 02/21/2024.  Marland Kitchen HIV/AIDS  Last CD4 count was 163 on March 15, 2024.   Last viral load was 80.   -Continue home Biktarvy. -Started Bactrim per ID.  Macrocytic anemia  Iron panel consistent with iron deficiency, with low ferritin, elevated TIBC. 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 - Continue ferrous sulfate.  Bipolar disorder - Continue home olanzapine.       Subjective: Patient has no new complaints.    Physical Exam: Vitals:   04/12/24 1604 04/12/24 2100 04/13/24 0605 04/13/24 0757  BP: 128/71 (!) 174/91 120/72 117/74  Pulse: 90 97 93 84  Resp: 18 18 18 18   Temp: 98.2 F (36.8 C) 98.5 F (36.9 C) 98.4 F (36.9 C) 98.6 F (37 C)  TempSrc: Oral Oral Oral Oral  SpO2: 96% 100% 99% 99%  Weight:      Height:         General: Alert, oriented X3  Eyes: Pupils equal, reactive  Oral cavity: moist mucous membranes  Head: Atraumatic,  normocephalic  Neck: supple  Chest: clear to auscultation. No crackles, no wheezes  CVS: S1,S2 RRR. No murmurs  Abd: No distention, soft, non-tender. No masses palpable  Extr: No edema   MSK: Right little finger in dressing.  Neurological: Grossly intact.      Data Reviewed:     Latest Ref Rng & Units 04/10/2024    5:26 AM 04/06/2024    6:36 AM 04/05/2024    6:26 PM  CBC  WBC 4.0 - 10.5 K/uL 3.0  6.1  5.4   Hemoglobin 13.0 - 17.0 g/dL 57.8  46.9  62.9   Hematocrit 39.0 - 52.0 % 36.9  30.7  32.2   Platelets 150 - 400 K/uL 412  309  316       Latest Ref Rng & Units 04/11/2024    4:46 AM 04/10/2024    5:26 AM 04/09/2024    5:35 AM  BMP  Glucose 70 - 99 mg/dL 528  98  413   BUN 6 - 20 mg/dL 14  14  11    Creatinine 0.61 - 1.24 mg/dL 2.44  0.10  2.72   Sodium 135 - 145 mmol/L 134  134  133   Potassium 3.5 - 5.1 mmol/L 4.0  4.4  4.4   Chloride 98 - 111 mmol/L 102  103  103   CO2 22 - 32 mmol/L 23  24  22    Calcium 8.9 - 10.3 mg/dL 9.7  9.6  9.6      Family Communication: n/a  Disposition: Status is: Inpatient Remains inpatient appropriate because: Requires IV antibiotics for treatment of his finger infection.  Planned Discharge Destination: Home DVT ppx: SCDs     Time spent: 25 minutes  Author: MDALA-GAUSI, Tayshun Gappa AGATHA, MD 04/13/2024 2:27 PM  For on call review [email or web address].

## 2024-04-13 NOTE — TOC Progression Note (Signed)
 Transition of Care [facility]) - Progression Note    Patient Details  Name: Mark Galloway MRN: [id number] Date of Birth: [birth year]  [facility] CM/SW Contact  Jeani Mill, RN Phone Number: 04/13/2024, 12:16 PM  Clinical Narrative:   Patient will need IV antibiotics: End date for 6 weeks of treatment would be 05/17/2024.   Patient does not appear to be a candidate for a PICC line, given use of IV methamphetamine documented by ED provider on 02/21/2024.  TOC folloiwng.   Expected Discharge Plan: Homeless Shelter Barriers to Discharge: Continued Medical Work up  Expected Discharge Plan and Services       Living arrangements for the past 2 months: Single Family Home                                       Social Determinants of Health (SDOH) Interventions SDOH Screenings   Food Insecurity: Food Insecurity Present (04/06/2024)  Housing: High Risk (04/06/2024)  Transportation Needs: Unmet Transportation Needs (04/06/2024)  Utilities: At Risk (04/06/2024)  Alcohol Screen: Low Risk  (03/14/2024)  Depression (PHQ2-9): Low Risk  (03/08/2023)  Financial Resource Strain: High Risk (01/04/2024)   Received from [facility]  Social Connections: Unknown (05/04/2022)   [facility]  Tobacco Use: High Risk (04/09/2024)    Readmission Risk Interventions     No data to display

## 2024-04-13 NOTE — Progress Notes (Signed)
 Occupational Therapy Treatment Patient Details Name: Mark Galloway MRN: [id number] DOB: 09/17/1986 Today's Date: 04/13/2024   History of present illness Pt is a 38 yo male admitted to [name] on 04/05/24 with worsening swelling and pain in the R hand little finger. PMH of HIV, schizophrenia, bipolar disorder, I &D R little finger.   OT comments  Patient received in supine and seen to address RUE hand HEP to address ROM for R little finger. Patient able to follow written HEP, tendon glide, and scare tissue massage to address ROM and pain. Patient to continue to be followed by acute OT while in this setting and follow up up with outpatient OT following discharge.       If plan is discharge home, recommend the following:  Other (comment) (prn)   Equipment Recommendations  None recommended by OT    Recommendations for Other Services      Precautions / Restrictions Precautions Precautions: None Recall of Precautions/Restrictions: Intact Restrictions Weight Bearing Restrictions Per Provider Order: No       Mobility Bed Mobility Overal bed mobility: Independent                  Transfers                         Balance                                           ADL either performed or assessed with clinical judgement   ADL Overall ADL's : Independent                                       General ADL Comments: patient left with lunch and able to manage utensils and open containers    Extremity/Trunk Assessment Upper Extremity Assessment Upper Extremity Assessment: RUE deficits/detail;Right hand dominant RUE Deficits / Details: Weak grasp, unable to make full composite fist with pinky finger included. Pinky finger noted to be eematuos and DIP limited/rests in 10 degrees of flexion, difficult to fully assess PIP ROM due to bandaging but ROM is limited and it also rests in slight flexion.            Vision       Perception      Praxis     Communication Communication Communication: No apparent difficulties   Cognition Arousal: Alert Behavior During Therapy: WFL for tasks assessed/performed Cognition: No apparent impairments                               Following commands: Intact        Cueing   Cueing Techniques: Verbal cues  Exercises Exercises: Other exercises Other Exercises Other Exercises: Tendon Glides Other Exercises: DIP/PIP flexion and extension R little finger Other Exercises: Palm flat, finger abduction/adduction    Shoulder Instructions       General Comments      Pertinent Vitals/ Pain       Pain Assessment Pain Assessment: Faces Faces Pain Scale: Hurts a little bit Pain Location: R little finger Pain Descriptors / Indicators: Dull Pain Intervention(s): Limited activity within patient's tolerance, Monitored during session  Home Living  Prior Functioning/Environment              Frequency  Min 4X/week        Progress Toward Goals  OT Goals(current goals can now be found in the care plan section)  Progress towards OT goals: Progressing toward goals  Acute Rehab OT Goals Patient Stated Goal: more ROM to right little finger OT Goal Formulation: With patient Time For Goal Achievement: 04/23/24 Potential to Achieve Goals: Good ADL Goals Pt/caregiver will Perform Home Exercise Program: Right Upper extremity;With written HEP provided;Independently;Increased ROM;Increased strength Additional ADL Goal #2: Pt will demonstrate understanding of scar management and edema management strategies.  Plan      Co-evaluation                 AM-PAC OT "6 Clicks" Daily Activity     Outcome Measure   Help from another person eating meals?: None Help from another person taking care of personal grooming?: None Help from another person toileting, which includes using toliet, bedpan, or urinal?:  None Help from another person bathing (including washing, rinsing, drying)?: None Help from another person to put on and taking off regular upper body clothing?: None Help from another person to put on and taking off regular lower body clothing?: None 6 Click Score: 24    End of Session    OT Visit Diagnosis: Pain;Muscle weakness (generalized) (M62.81) Pain - Right/Left: Right Pain - part of body: Hand   Activity Tolerance Patient tolerated treatment well   Patient Left in bed;with call bell/phone within reach   Nurse Communication Mobility status        Time: 1253-1310 OT Time Calculation (min): 17 min  Charges: OT General Charges $OT Visit: 1 Visit OT Treatments $Therapeutic Exercise: 8-22 mins  Anitra Barn, OTA Acute Rehabilitation Services  Office [phone]   Jovita Nipper 04/13/2024, 2:10 PM

## 2024-04-14 DIAGNOSIS — L03119 Cellulitis of unspecified part of limb: Secondary | ICD-10-CM | POA: Diagnosis not present

## 2024-04-14 LAB — BASIC METABOLIC PANEL WITH GFR
Anion gap: 8 (ref 5–15)
BUN: 11 mg/dL (ref 6–20)
CO2: 23 mmol/L (ref 22–32)
Calcium: 9.5 mg/dL (ref 8.9–10.3)
Chloride: 106 mmol/L (ref 98–111)
Creatinine, Ser: 0.74 mg/dL (ref 0.61–1.24)
GFR, Estimated: >60 mL/min (ref >60)
Glucose, Bld: 89 mg/dL (ref 70–99)
Potassium: 4.2 mmol/L (ref 3.5–5.1)
Sodium: 137 mmol/L (ref 135–145)

## 2024-04-14 NOTE — Progress Notes (Signed)
 Occupational Therapy Treatment Patient Details Name: Mark Galloway MRN: [id number] DOB: [birth year] Today's Date: 04/14/2024   History of present illness Pt is a 38 yo male admitted to [facility] on 04/05/24 with worsening swelling and pain in the R hand little finger. PMH of HIV, schizophrenia, bipolar disorder, I &D R little finger.   OT comments  Patient seen to address RUE little finger ROM. Patient performed tendon glide exercises from HEP, SROM, and scar tissue massage to increase ROM. Finger extension/flexion continues to be limited and acute OT will continue to address.       If plan is discharge home, recommend the following:  Other (comment) (PRN)   Equipment Recommendations  None recommended by OT    Recommendations for Other Services      Precautions / Restrictions Precautions Precautions: None Recall of Precautions/Restrictions: Intact Restrictions Weight Bearing Restrictions Per Provider Order: No       Mobility Bed Mobility Overal bed mobility: Independent                  Transfers Overall transfer level: Independent                       Balance                                           ADL either performed or assessed with clinical judgement   ADL Overall ADL's : Independent                                       General ADL Comments: Patient able to perform self care tasks and functional transfers without assistance    Extremity/Trunk Assessment Upper Extremity Assessment Upper Extremity Assessment: Right hand dominant;RUE deficits/detail RUE Deficits / Details: Weak grasp, unable to make full composite fist with pinky finger included. Pinky finger noted to be eematuos and DIP limited/rests in 10 degrees of flexion, difficult to fully assess PIP ROM due to bandaging but ROM is limited and it also rests in slight flexion.            Vision       Perception     Praxis     Communication  Communication Communication: No apparent difficulties   Cognition Arousal: Alert Behavior During Therapy: WFL for tasks assessed/performed Cognition: No apparent impairments                               Following commands: Intact        Cueing   Cueing Techniques: Verbal cues  Exercises Exercises: Other exercises Other Exercises Other Exercises: Tendon Glides Other Exercises: DIP/PIP flexion and extension R little finger Other Exercises: Palm flat, finger abduction/adduction    Shoulder Instructions       General Comments      Pertinent Vitals/ Pain       Pain Assessment Pain Assessment: Faces Faces Pain Scale: Hurts a little bit Pain Location: R little finger Pain Descriptors / Indicators: Dull Pain Intervention(s): Monitored during session, Repositioned  Home Living  Prior Functioning/Environment              Frequency  Min 4X/week        Progress Toward Goals  OT Goals(current goals can now be found in the care plan section)  Progress towards OT goals: Progressing toward goals  Acute Rehab OT Goals Patient Stated Goal: none stated OT Goal Formulation: With patient Time For Goal Achievement: 04/23/24 Potential to Achieve Goals: Good ADL Goals Pt/caregiver will Perform Home Exercise Program: Right Upper extremity;With written HEP provided;Independently;Increased ROM;Increased strength Additional ADL Goal #2: Pt will demonstrate understanding of scar management and edema management strategies.  Plan      Co-evaluation                 AM-PAC OT "6 Clicks" Daily Activity     Outcome Measure   Help from another person eating meals?: None Help from another person taking care of personal grooming?: None Help from another person toileting, which includes using toliet, bedpan, or urinal?: None Help from another person bathing (including washing, rinsing, drying)?:  None Help from another person to put on and taking off regular upper body clothing?: None Help from another person to put on and taking off regular lower body clothing?: None 6 Click Score: 24    End of Session    OT Visit Diagnosis: Pain;Muscle weakness (generalized) (M62.81) Pain - Right/Left: Right Pain - part of body: Hand   Activity Tolerance Patient tolerated treatment well   Patient Left in bed;with call bell/phone within reach   Nurse Communication Mobility status        Time: 4696-2952 OT Time Calculation (min): 25 min  Charges: OT General Charges $OT Visit: 1 Visit OT Treatments $Therapeutic Exercise: 23-37 mins  Anitra Barn, OTA Acute Rehabilitation Services  Office [phone]   Jovita Nipper 04/14/2024, 11:26 AM

## 2024-04-14 NOTE — Plan of Care (Signed)

## 2024-04-14 NOTE — Progress Notes (Signed)
 PROGRESS NOTE  Mark Galloway  WJX:[id number] DOB: 01/20/1986 DOA: 04/05/2024 PCP: Veryl Speak, [name]   Brief Narrative: Patient is a 38 year old male with history of HIV on ART, bipolar disorder, recent right little finger infection who presented with worsening pain, swelling of right little finger.  Orthopedics consulted for the concern of osteomyelitis.  No plan for surgical intervention.  Infectious disease consulted.  Recommended antibiotic for 6 weeks.  History of drug abuse so cannot be sent home with IV antibiotics.  Prolonged  hospitalization to finish the antibiotics course  Assessment & Plan:  Principal Problem:   Cellulitis of hand Active Problems:   Osteomyelitis of right hand ([name])   AIDS ([name])   Paranoid schizophrenia ([name])   Hyponatremia   Bipolar 1 disorder ([name])   Cellulitis   Macrocytic anemia   Nonadherence to medical treatment  Osteomyelitis of the right little finger/cellulitis of right hand: Was recently treated for the same. Patient was noncompliant to treatment.   X-rays on presentation concerning for osteomyelitis.  Orthopedics, ID consulted.  Recent debridement of right fifth finger abscess, cultures positive for MRSA and ESBL E. coli.  Imaging done here did not significant collection.  Currently on vancomycin, ertapenem, plan for 6 weeks of treatment.  Antibiotics will be continued for 05/17/2024.  Recommended for PICC line and home antibiotic administration admitted against  History of HIV/AIDS: Last CD4 count was 163 as per March  6, 25.  Continue home Biktarvy.  Also started on Bactrim by ID  Macrocytic anemia: Iron panel consistent with iron deficiency, low ferritin.  Continue iron supplementation  Bipolar disorder: On olanzapine        DVT prophylaxis:SCDs Start: 04/05/24 2124     Code Status: Full Code  Family Communication: None at bedside  Patient status:Inpatient  Patient is from :Home  Anticipated discharge OZ:HYQM  Estimated DC  date:after finishing abx   Consultants: Orthopedics, ID  Procedures: None  Antimicrobials:  Anti-infectives (From admission, onward)    Start     Dose/Rate Route Frequency Ordered Stop   04/09/24 1400  vancomycin (VANCOREADY) IVPB 1250 mg/250 mL        1,250 mg 166.7 mL/hr over 90 Minutes Intravenous Every 8 hours 04/09/24 0819     04/07/24 1000  sulfamethoxazole-trimethoprim (BACTRIM DS) 800-160 MG per tablet 1 tablet        1 tablet Oral Daily 04/06/24 1506     04/06/24 1800  vancomycin (VANCOREADY) IVPB 1500 mg/300 mL  Status:  Discontinued        1,500 mg 150 mL/hr over 120 Minutes Intravenous Every 12 hours 04/06/24 0342 04/09/24 0819   04/06/24 1530  ertapenem (INVANZ) 1 g in sodium chloride 0.9 % 100 mL IVPB        1 g 200 mL/hr over 30 Minutes Intravenous Every 24 hours 04/06/24 1442     04/06/24 1000  bictegravir-emtricitabine-tenofovir AF (BIKTARVY) 50-200-25 MG per tablet 1 tablet        1 tablet Oral Daily 04/05/24 2124     04/06/24 0400  vancomycin (VANCOREADY) IVPB 1500 mg/300 mL        1,500 mg 150 mL/hr over 120 Minutes Intravenous  Once 04/06/24 0301 04/06/24 0730   04/06/24 0315  ceFEPIme (MAXIPIME) 2 g in sodium chloride 0.9 % 100 mL IVPB  Status:  Discontinued        2 g 200 mL/hr over 30 Minutes Intravenous Every 8 hours 04/06/24 0301 04/06/24 1442       Subjective:  Patient seen and examined at bedside today.  Comfortable lying in bed.  .  Complains of some loose stools.  Denies abdominal pain, nausea or vomiting.  We discussed that the diarrhea could be from antibiotics as well   Objective: Vitals:   04/13/24 0757 04/13/24 1539 04/13/24 2034 04/14/24 0551  BP: 117/74 120/73 122/73 115/80  Pulse: 84 90 97 86  Resp: 18 18 18 20   Temp: 98.6 F (37 C) 98.4 F (36.9 C) 99 F (37.2 C) 98.4 F (36.9 C)  TempSrc: Oral Oral Oral Oral  SpO2: 99% 99% 99% 100%  Weight:      Height:        Intake/Output Summary (Last 24 hours) at 04/14/2024 1358 Last  data filed at 04/13/2024 2301 Gross per 24 hour  Intake --  Output 500 ml  Net -500 ml   Filed Weights   04/05/24 1720  Weight: 74.8 kg    Examination:   General exam: Overall comfortable, not in distress HEENT: PERRL Respiratory system:  no wheezes or crackles  Cardiovascular system: S1 & S2 heard, RRR.  Gastrointestinal system: Abdomen is nondistended, soft and nontender. Central nervous system: Alert and oriented Extremities: No edema, no clubbing ,no cyanosis.  Right little finger covered with dressing, no drainage Skin: No rashes, no ulcers,no icterus     Data Reviewed: I have personally reviewed following labs and imaging studies  CBC: Recent Labs  Lab 04/10/24 0526  WBC 3.0*  NEUTROABS 0.7*  HGB 13.0  HCT 36.9*  MCV 95.8  PLT 412*   Basic Metabolic Panel: Recent Labs  Lab 04/08/24 0455 04/09/24 0535 04/10/24 0526 04/11/24 0446 04/14/24 0823  NA 134* 133* 134* 134* 137  K 4.1 4.4 4.4 4.0 4.2  CL 104 103 103 102 106  CO2 22 22 24 23 23   GLUCOSE 105* 101* 98 112* 89  BUN 11 11 14 14 11   CREATININE 0.72 0.66 0.77 0.76 0.74  CALCIUM 9.0 9.6 9.6 9.7 9.5     Recent Results (from the past 240 hours)  Blood culture (routine x 2)     Status: None   Collection Time: 04/05/24  6:26 PM   Specimen: BLOOD  Result Value Ref Range Status   Specimen Description   Final    BLOOD SITE NOT SPECIFIED Performed at [facility], [address] [place], [place] [address]    Special Requests   Final    BOTTLES DRAWN AEROBIC AND ANAEROBIC Blood Culture results may not be optimal due to an inadequate volume of blood received in culture bottles Performed at [facility], [address], [place], [place] [address]    Culture   Final    NO GROWTH 5 DAYS Performed at [facility], [address] [place], [place] [address]    Report Status 04/10/2024 FINAL  Final  Blood culture (routine x 2)     Status: None   Collection  Time: 04/05/24  9:10 PM   Specimen: BLOOD RIGHT ARM  Result Value Ref Range Status   Specimen Description BLOOD RIGHT ARM  Final   Special Requests   Final    BOTTLES DRAWN AEROBIC AND ANAEROBIC Blood Culture adequate volume   Culture   Final    NO GROWTH 5 DAYS Performed at [facility], [address] [place], [place] [address]    Report Status 04/10/2024 FINAL  Final  MRSA Next Gen by PCR, Nasal     Status:  Abnormal   Collection Time: 04/07/24  9:04 AM   Specimen: Nasal Mucosa; Nasal Swab  Result Value Ref Range Status   MRSA by PCR Next Gen DETECTED (A) NOT DETECTED Final    Comment: RESULT CALLED TO, READ BACK BY AND VERIFIED WITH: RN D. [id number] @1547  FH (NOTE) The GeneXpert MRSA Assay (FDA approved for NASAL specimens only), is one component of a comprehensive MRSA colonization surveillance program. It is not intended to diagnose MRSA infection nor to guide or monitor treatment for MRSA infections. Test performance is not FDA approved in patients less than 24 years old. Performed at [facility], [address], [place], [place] [address]      Radiology Studies: No results found.  Scheduled Meds:  bictegravir-emtricitabine-tenofovir AF  1 tablet Oral Daily   ferrous sulfate  325 mg Oral Q lunch   OLANZapine zydis  15 mg Oral BID   sulfamethoxazole-trimethoprim  1 tablet Oral Daily   Continuous Infusions:  ertapenem 1 g (04/14/24 1104)   vancomycin 1,250 mg (04/14/24 1328)     LOS: 8 days   Leona Rake, MD [facility] P4/15/2025, 1:58 PM

## 2024-04-15 DIAGNOSIS — L03119 Cellulitis of unspecified part of limb: Secondary | ICD-10-CM | POA: Diagnosis not present

## 2024-04-15 NOTE — Progress Notes (Signed)
 Patient ID: Mark Galloway, male   DOB: 08/17/1986, 38 y.o.   MRN: [id number]   LOS: 9 days   Subjective: Doing much better. Pain improved, still with significant contracture.   Objective: Vital signs in last 24 hours: Temp:  [97.6 F (36.4 C)-98.1 F (36.7 C)] 98.1 F (36.7 C) (04/16 0804) Pulse Rate:  [87-93] 87 (04/16 0804) Resp:  [18-19] 19 (04/16 0804) BP: (113-136)/(64-72) 118/72 (04/16 0804) SpO2:  [98 %] 98 % (04/16 0804) Last BM Date : 04/13/24   Laboratory  CBC No results for input(s): "WBC", "HGB", "HCT", "PLT" in the last 72 hours. BMET Recent Labs    04/14/24 0823  NA 137  K 4.2  CL 106  CO2 23  GLUCOSE 89  BUN 11  CREATININE 0.74  CALCIUM 9.5     Physical Exam General appearance: alert and no distress Right little finger -- Wound with good granulation.    Assessment/Plan: Right little finger infection -- Continue present management. Ok for discharge from hand surgery perspective. F/u with Dr. Kuzma in 1-2 weeks.    Georganna Kin, PA-C Orthopedic Surgery [phone] 04/15/2024

## 2024-04-15 NOTE — Progress Notes (Signed)
 PROGRESS NOTE  Mark Galloway  OZH:[id number] DOB: [birth year] DOA: 04/05/2024 PCP: Veryl Speak, [name]   Brief Narrative: Patient is a 38 year old male with history of HIV on ART, bipolar disorder, recent right little finger infection who presented with worsening pain, swelling of right little finger.  Orthopedics consulted for the concern of osteomyelitis.  No plan for surgical intervention.  Infectious disease consulted.  Recommended antibiotic for 6 weeks.  History of drug abuse so cannot be sent home with IV antibiotics.  Prolonged  hospitalization to finish the antibiotics course  Assessment & Plan:  Principal Problem:   Cellulitis of hand Active Problems:   Osteomyelitis of right hand ([name])   AIDS ([name])   Paranoid schizophrenia ([name])   Hyponatremia   Bipolar 1 disorder ([name])   Cellulitis   Macrocytic anemia   Nonadherence to medical treatment  Osteomyelitis of the right little finger/cellulitis of right hand: Was recently treated for the same. Patient was noncompliant to treatment.   X-rays on presentation concerning for osteomyelitis.  Orthopedics, ID consulted.  Recent debridement of right fifth finger abscess, cultures positive for MRSA and ESBL E. coli.  Imaging done here did not significant collection.  Currently on vancomycin, ertapenem, plan for 6 weeks of treatment.  Antibiotics will be continued for 05/17/2024.  Recommended for PICC line and home antibiotic administration admitted against  History of HIV/AIDS: Last CD4 count was 163 as per March  6, 25.  Continue home Biktarvy.  Also started on Bactrim by ID  Macrocytic anemia: Iron panel consistent with iron deficiency, low ferritin.  Continue iron supplementation  Bipolar disorder: On olanzapine        DVT prophylaxis:SCDs Start: 04/05/24 2124     Code Status: Full Code  Family Communication: None at bedside  Patient status:Inpatient  Patient is from :Home  Anticipated discharge GE:XBMW  Estimated DC  date:after finishing abx   Consultants: Orthopedics, ID  Procedures: None  Antimicrobials:  Anti-infectives (From admission, onward)    Start     Dose/Rate Route Frequency Ordered Stop   04/09/24 1400  vancomycin (VANCOREADY) IVPB 1250 mg/250 mL        1,250 mg 166.7 mL/hr over 90 Minutes Intravenous Every 8 hours 04/09/24 0819     04/07/24 1000  sulfamethoxazole-trimethoprim (BACTRIM DS) 800-160 MG per tablet 1 tablet        1 tablet Oral Daily 04/06/24 1506     04/06/24 1800  vancomycin (VANCOREADY) IVPB 1500 mg/300 mL  Status:  Discontinued        1,500 mg 150 mL/hr over 120 Minutes Intravenous Every 12 hours 04/06/24 0342 04/09/24 0819   04/06/24 1530  ertapenem (INVANZ) 1 g in sodium chloride 0.9 % 100 mL IVPB        1 g 200 mL/hr over 30 Minutes Intravenous Every 24 hours 04/06/24 1442     04/06/24 1000  bictegravir-emtricitabine-tenofovir AF (BIKTARVY) 50-200-25 MG per tablet 1 tablet        1 tablet Oral Daily 04/05/24 2124     04/06/24 0400  vancomycin (VANCOREADY) IVPB 1500 mg/300 mL        1,500 mg 150 mL/hr over 120 Minutes Intravenous  Once 04/06/24 0301 04/06/24 0730   04/06/24 0315  ceFEPIme (MAXIPIME) 2 g in sodium chloride 0.9 % 100 mL IVPB  Status:  Discontinued        2 g 200 mL/hr over 30 Minutes Intravenous Every 8 hours 04/06/24 0301 04/06/24 1442       Subjective:  Patient seen and examined at bedside today.  Lying comfortably on bed.  Not in any acute distress.  No new complaints   Objective: Vitals:   04/14/24 0810 04/14/24 1613 04/15/24 0348 04/15/24 0804  BP: (!) 115/56 136/64 113/65 118/72  Pulse: 93 93 92 87  Resp:   18 19  Temp: 98 F (36.7 C) 98 F (36.7 C) 97.6 F (36.4 C) 98.1 F (36.7 C)  TempSrc:   Oral Oral  SpO2: 100% 98% 98% 98%  Weight:      Height:       No intake or output data in the 24 hours ending 04/15/24 1256  Filed Weights   04/05/24 1720  Weight: 74.8 kg    Examination:  General exam: Overall comfortable, not  in distress HEENT: PERRL Respiratory system:  no wheezes or crackles  Cardiovascular system: S1 & S2 heard, RRR.  Gastrointestinal system: Abdomen is nondistended, soft and nontender. Central nervous system: Alert and oriented Extremities: No edema, no clubbing ,no cyanosis, right little finger covered with dressing  skin: No rashes, no ulcers,no icterus       Data Reviewed: I have personally reviewed following labs and imaging studies  CBC: Recent Labs  Lab 04/10/24 0526  WBC 3.0*  NEUTROABS 0.7*  HGB 13.0  HCT 36.9*  MCV 95.8  PLT 412*   Basic Metabolic Panel: Recent Labs  Lab 04/09/24 0535 04/10/24 0526 04/11/24 0446 04/14/24 0823  NA 133* 134* 134* 137  K 4.4 4.4 4.0 4.2  CL 103 103 102 106  CO2 22 24 23 23   GLUCOSE 101* 98 112* 89  BUN 11 14 14 11   CREATININE 0.66 0.77 0.76 0.74  CALCIUM 9.6 9.6 9.7 9.5     Recent Results (from the past 240 hours)  Blood culture (routine x 2)     Status: None   Collection Time: 04/05/24  6:26 PM   Specimen: BLOOD  Result Value Ref Range Status   Specimen Description   Final    BLOOD SITE NOT SPECIFIED Performed at [facility], [address] [place], [place] [address]    Special Requests   Final    BOTTLES DRAWN AEROBIC AND ANAEROBIC Blood Culture results may not be optimal due to an inadequate volume of blood received in culture bottles Performed at [facility], [address] [place], [place] [address]    Culture   Final    NO GROWTH 5 DAYS Performed at [facility], [address] [place], [place] [address]    Report Status 04/10/2024 FINAL  Final  Blood culture (routine x 2)     Status: None   Collection Time: 04/05/24  9:10 PM   Specimen: BLOOD RIGHT ARM  Result Value Ref Range Status   Specimen Description BLOOD RIGHT ARM  Final   Special Requests   Final    BOTTLES DRAWN AEROBIC AND ANAEROBIC Blood Culture adequate volume   Culture   Final    NO GROWTH 5  DAYS Performed at [facility], [address], [place], [place] [address]    Report Status 04/10/2024 FINAL  Final  MRSA Next Gen by PCR, Nasal     Status: Abnormal   Collection Time: 04/07/24  9:04 AM   Specimen: Nasal Mucosa; Nasal Swab  Result Value Ref Range Status   MRSA by PCR Next Gen DETECTED (A) NOT DETECTED Final    Comment: RESULT CALLED TO, READ BACK BY AND VERIFIED  WITH: RN D. [id number] @1547  FH (NOTE) The GeneXpert MRSA Assay (FDA approved for NASAL specimens only), is one component of a comprehensive MRSA colonization surveillance program. It is not intended to diagnose MRSA infection nor to guide or monitor treatment for MRSA infections. Test performance is not FDA approved in patients less than 38 years old. Performed at [facility], [address], [place], [place] [address]      Radiology Studies: No results found.  Scheduled Meds:  bictegravir-emtricitabine-tenofovir AF  1 tablet Oral Daily   ferrous sulfate  325 mg Oral Q lunch   OLANZapine zydis  15 mg Oral BID   sulfamethoxazole-trimethoprim  1 tablet Oral Daily   Continuous Infusions:  ertapenem 1 g (04/15/24 0902)   vancomycin 1,250 mg (04/15/24 0603)     LOS: 9 days   Leona Rake, MD [facility] P4/16/2025, 12:56 PM

## 2024-04-15 NOTE — Plan of Care (Signed)

## 2024-04-15 NOTE — Progress Notes (Signed)
 Occupational Therapy Treatment Patient Details Name: Mark Galloway MRN: [id number] DOB: Jul 14, 1986 Today's Date: 04/15/2024   History of present illness Pt is a 38 yo male admitted to [facility] on 04/05/24 with worsening swelling and pain in the R hand little finger. PMH of HIV, schizophrenia, bipolar disorder, I &D R little finger.   OT comments  Patient continues to tolerate RUE HEP for flexion/extension of DIP, PIP, and MCP. Patient expressed concern over over stretching and opening wound. Patient also concerned of not being able to increase ROM with exercises or surgery although patient demonstrating slight increase with flexion/extension at MCP and DIP. Patient to continue to be followed by acute OT to address RUE HEP.       If plan is discharge home, recommend the following:  Other (comment) (PRN)   Equipment Recommendations  None recommended by OT    Recommendations for Other Services      Precautions / Restrictions Precautions Precautions: None Recall of Precautions/Restrictions: Intact Restrictions Weight Bearing Restrictions Per Provider Order: No       Mobility Bed Mobility Overal bed mobility: Independent                  Transfers Overall transfer level: Independent                       Balance                                           ADL either performed or assessed with clinical judgement   ADL Overall ADL's : Independent                                       General ADL Comments: Patient able to perform self care tasks and functional transfers without assistance    Extremity/Trunk Assessment Upper Extremity Assessment Upper Extremity Assessment: Right hand dominant;RUE deficits/detail RUE Deficits / Details: Weak grasp, unable to make full composite fist with pinky finger included. Pinky finger noted to be eematuos and DIP limited/rests in 10 degrees of flexion, difficult to fully assess PIP ROM due to  bandaging but ROM is limited and it also rests in slight flexion.            Vision       Perception     Praxis     Communication Communication Communication: No apparent difficulties   Cognition Arousal: Alert Behavior During Therapy: WFL for tasks assessed/performed Cognition: No apparent impairments                               Following commands: Intact        Cueing   Cueing Techniques: Verbal cues  Exercises Exercises: Other exercises Other Exercises Other Exercises: Tendon Glides following HEP Other Exercises: DIP/PIP flexion and extension R little finger Other Exercises: Isolated finger ext with palm supported on flat suface RUE Other Exercises: Palm flat, finger abduction/adduction    Shoulder Instructions       General Comments patient concerned that too much stretching of finger could open wound more    Pertinent Vitals/ Pain       Pain Assessment Pain Assessment: Faces Faces Pain Scale: Hurts a little bit Pain Location: R little  finger Pain Descriptors / Indicators: Dull Pain Intervention(s): Monitored during session  Home Living                                          Prior Functioning/Environment              Frequency  Min 4X/week        Progress Toward Goals  OT Goals(current goals can now be found in the care plan section)  Progress towards OT goals: Progressing toward goals  Acute Rehab OT Goals Patient Stated Goal: increase finger ROM OT Goal Formulation: With patient Time For Goal Achievement: 04/23/24 Potential to Achieve Goals: Good ADL Goals Pt/caregiver will Perform Home Exercise Program: Right Upper extremity;With written HEP provided;Independently;Increased ROM;Increased strength Additional ADL Goal #2: Pt will demonstrate understanding of scar management and edema management strategies.  Plan      Co-evaluation                 AM-PAC OT "6 Clicks" Daily Activity      Outcome Measure   Help from another person eating meals?: None Help from another person taking care of personal grooming?: None Help from another person toileting, which includes using toliet, bedpan, or urinal?: None Help from another person bathing (including washing, rinsing, drying)?: None Help from another person to put on and taking off regular upper body clothing?: None Help from another person to put on and taking off regular lower body clothing?: None 6 Click Score: 24    End of Session    OT Visit Diagnosis: Pain;Muscle weakness (generalized) (M62.81) Pain - Right/Left: Right Pain - part of body: Hand   Activity Tolerance Patient tolerated treatment well   Patient Left in bed;with call bell/phone within reach   Nurse Communication Mobility status        Time: 7829-5621 OT Time Calculation (min): 23 min  Charges: OT General Charges $OT Visit: 1 Visit OT Treatments $Therapeutic Exercise: 23-37 mins  Anitra Barn, OTA Acute Rehabilitation Services  Office [phone]   Jovita Nipper 04/15/2024, 12:50 PM

## 2024-04-16 DIAGNOSIS — L03119 Cellulitis of unspecified part of limb: Secondary | ICD-10-CM | POA: Diagnosis not present

## 2024-04-16 LAB — BASIC METABOLIC PANEL WITH GFR
Anion gap: 6 (ref 5–15)
BUN: 9 mg/dL (ref 6–20)
CO2: 22 mmol/L (ref 22–32)
Calcium: 9.4 mg/dL (ref 8.9–10.3)
Chloride: 108 mmol/L (ref 98–111)
Creatinine, Ser: 0.69 mg/dL (ref 0.61–1.24)
GFR, Estimated: >60 mL/min (ref >60)
Glucose, Bld: 97 mg/dL (ref 70–99)
Potassium: 4.1 mmol/L (ref 3.5–5.1)
Sodium: 136 mmol/L (ref 135–145)

## 2024-04-16 MED ORDER — VANCOMYCIN HCL 1.25 G IV SOLR
1250.0000 mg | Freq: Three times a day (TID) | INTRAVENOUS | Status: DC
  Administered 2024-04-16: 1250 mg via INTRAVENOUS
  Filled 2024-04-16 (×4): qty 25

## 2024-04-16 NOTE — Progress Notes (Signed)
 PROGRESS NOTE  Mark Galloway  ZOX:[id number] DOB: 1986/03/21 DOA: 04/05/2024 PCP: Veryl Speak, [name]   Brief Narrative: Patient is a 38 year old male with history of HIV on ART, bipolar disorder, recent right little finger infection who presented with worsening pain, swelling of right little finger.  Orthopedics consulted for the concern of osteomyelitis.  No plan for surgical intervention.  Infectious disease consulted.  Recommended antibiotic for 6 weeks.  History of drug abuse so cannot be sent home with IV antibiotics.  Prolonged  hospitalization to finish the antibiotics course  Assessment & Plan:  Principal Problem:   Cellulitis of hand Active Problems:   Osteomyelitis of right hand ([name])   AIDS ([name])   Paranoid schizophrenia ([name])   Hyponatremia   Bipolar 1 disorder ([name])   Cellulitis   Macrocytic anemia   Nonadherence to medical treatment  Osteomyelitis of the right little finger/cellulitis of right hand: Was recently treated for the same. Patient was noncompliant to treatment.   X-rays on presentation concerning for osteomyelitis.  Orthopedics, ID consulted.  Recent debridement of right fifth finger abscess, cultures positive for MRSA and ESBL E. coli.  Imaging done here did not significant collection.  Currently on vancomycin, ertapenem, plan for 6 weeks of treatment.  Antibiotics will be continued for 05/17/2024.  Recommended for PICC line and home antibiotic administration admitted against  History of HIV/AIDS: Last CD4 count was 163 as per March  6, 25.  Continue home Biktarvy.  Also started on Bactrim by ID  Macrocytic anemia: Iron panel consistent with iron deficiency, low ferritin.  Continue iron supplementation  Bipolar disorder: On olanzapine        DVT prophylaxis:SCDs Start: 04/05/24 2124     Code Status: Full Code  Family Communication: None at bedside  Patient status:Inpatient  Patient is from :Home  Anticipated discharge WJ:XBJY  Estimated DC  date:after finishing abx   Consultants: Orthopedics, ID  Procedures: None  Antimicrobials:  Anti-infectives (From admission, onward)    Start     Dose/Rate Route Frequency Ordered Stop   04/09/24 1400  vancomycin (VANCOREADY) IVPB 1250 mg/250 mL        1,250 mg 166.7 mL/hr over 90 Minutes Intravenous Every 8 hours 04/09/24 0819     04/07/24 1000  sulfamethoxazole-trimethoprim (BACTRIM DS) 800-160 MG per tablet 1 tablet        1 tablet Oral Daily 04/06/24 1506     04/06/24 1800  vancomycin (VANCOREADY) IVPB 1500 mg/300 mL  Status:  Discontinued        1,500 mg 150 mL/hr over 120 Minutes Intravenous Every 12 hours 04/06/24 0342 04/09/24 0819   04/06/24 1530  ertapenem (INVANZ) 1 g in sodium chloride 0.9 % 100 mL IVPB        1 g 200 mL/hr over 30 Minutes Intravenous Every 24 hours 04/06/24 1442     04/06/24 1000  bictegravir-emtricitabine-tenofovir AF (BIKTARVY) 50-200-25 MG per tablet 1 tablet        1 tablet Oral Daily 04/05/24 2124     04/06/24 0400  vancomycin (VANCOREADY) IVPB 1500 mg/300 mL        1,500 mg 150 mL/hr over 120 Minutes Intravenous  Once 04/06/24 0301 04/06/24 0730   04/06/24 0315  ceFEPIme (MAXIPIME) 2 g in sodium chloride 0.9 % 100 mL IVPB  Status:  Discontinued        2 g 200 mL/hr over 30 Minutes Intravenous Every 8 hours 04/06/24 0301 04/06/24 1442       Subjective:  Patient seen and examined at bedside.  Comfortable.  Lying in bed.  Not in any kind of distress.  Right little finger wound looks dry, with healthy granulation tissue   Objective: Vitals:   04/15/24 0804 04/15/24 1954 04/16/24 0410 04/16/24 0825  BP: 118/72 134/72 (!) 105/51 120/70  Pulse: 87 100 90 100  Resp: 19 16 16 16   Temp: 98.1 F (36.7 C) 98.6 F (37 C) 98.6 F (37 C) 98.1 F (36.7 C)  TempSrc: Oral Oral Oral Oral  SpO2: 98% 100% 99% 100%  Weight:      Height:       No intake or output data in the 24 hours ending 04/16/24 1120  Filed Weights   04/05/24 1720  Weight: 74.8  kg    Examination:   General exam: Overall comfortable, not in distress HEENT: PERRL Respiratory system:  no wheezes or crackles  Cardiovascular system: S1 & S2 heard, RRR.  Gastrointestinal system: Abdomen is nondistended, soft and nontender. Central nervous system: Alert and oriented Extremities: No edema, no clubbing ,no cyanosis, ulcer on the right little finger with healthy granulation tissue Skin: No rashes, no ulcers,no icterus        Data Reviewed: I have personally reviewed following labs and imaging studies  CBC: Recent Labs  Lab 04/10/24 0526  WBC 3.0*  NEUTROABS 0.7*  HGB 13.0  HCT 36.9*  MCV 95.8  PLT 412*   Basic Metabolic Panel: Recent Labs  Lab 04/10/24 0526 04/11/24 0446 04/14/24 0823 04/16/24 0550  NA 134* 134* 137 136  K 4.4 4.0 4.2 4.1  CL 103 102 106 108  CO2 24 23 23 22   GLUCOSE 98 112* 89 97  BUN 14 14 11 9   CREATININE 0.77 0.76 0.74 0.69  CALCIUM 9.6 9.7 9.5 9.4     Recent Results (from the past 240 hours)  MRSA Next Gen by PCR, Nasal     Status: Abnormal   Collection Time: 04/07/24  9:04 AM   Specimen: Nasal Mucosa; Nasal Swab  Result Value Ref Range Status   MRSA by PCR Next Gen DETECTED (A) NOT DETECTED Final    Comment: RESULT CALLED TO, READ BACK BY AND VERIFIED WITH: RN D. [id number] @1547  FH (NOTE) The GeneXpert MRSA Assay (FDA approved for NASAL specimens only), is one component of a comprehensive MRSA colonization surveillance program. It is not intended to diagnose MRSA infection nor to guide or monitor treatment for MRSA infections. Test performance is not FDA approved in patients less than 37 years old. Performed at [facility], [address] [place], [place] [address]      Radiology Studies: No results found.  Scheduled Meds:  bictegravir-emtricitabine-tenofovir AF  1 tablet Oral Daily   ferrous sulfate  325 mg Oral Q lunch   OLANZapine zydis  15 mg Oral BID   sulfamethoxazole-trimethoprim  1  tablet Oral Daily   Continuous Infusions:  ertapenem 1 g (04/16/24 0932)   vancomycin 1,250 mg (04/16/24 0541)     LOS: 10 days   Leona Rake, MD [facility] P4/17/2025, 11:20 AM

## 2024-04-16 NOTE — Progress Notes (Signed)
 Occupational Therapy Treatment Patient Details Name: Mark Galloway MRN: [id number] DOB: 1986-07-13 Today's Date: 04/16/2024   History of present illness Pt is a 38 yo male admitted to [facility] on 04/05/24 with worsening swelling and pain in the R hand little finger. PMH of HIV, schizophrenia, bipolar disorder, I &D R little finger.   OT comments  Patient agreeable to session. Continued with HEP for RUE to improve flexion and extension of all joints of little finger. Pt tolerated completing AROM and nerve glides without assistance as well tolerating passive flexion/extension. Pt curious about possible finger orthosis to aid in restoring function to affected finger. Acute OT to continue to follow to address established goals to facilitate DC to next venue of care.        If plan is discharge home, recommend the following:  Other (comment) (PRN)   Equipment Recommendations  None recommended by OT    Recommendations for Other Services      Precautions / Restrictions Precautions Precautions: None Recall of Precautions/Restrictions: Intact Restrictions Weight Bearing Restrictions Per Provider Order: No       Mobility Bed Mobility Overal bed mobility: Independent                  Transfers Overall transfer level: Independent                       Balance                                           ADL either performed or assessed with clinical judgement   ADL Overall ADL's : Independent                                       General ADL Comments: Pt completes ADL tasks with no assist    Extremity/Trunk Assessment              Vision       Perception     Praxis     Communication Communication Communication: No apparent difficulties   Cognition Arousal: Alert Behavior During Therapy: WFL for tasks assessed/performed Cognition: No apparent impairments                               Following commands:  Intact        Cueing   Cueing Techniques: Verbal cues  Exercises Exercises: Other exercises Other Exercises Other Exercises: Tendon Glides following HEP Other Exercises: DIP/PIP flexion and extension R little finger Other Exercises: Isolated finger ext with palm supported RUE    Shoulder Instructions       General Comments pt curious about possible finger orthosis for little finger to improve extension    Pertinent Vitals/ Pain       Pain Assessment Pain Assessment: Faces Faces Pain Scale: No hurt Pain Intervention(s): Monitored during session  Home Living                                          Prior Functioning/Environment              Frequency  Min 4X/week  Progress Toward Goals  OT Goals(current goals can now be found in the care plan section)  Progress towards OT goals: Progressing toward goals  Acute Rehab OT Goals Patient Stated Goal: increase use and function of little finger OT Goal Formulation: With patient Time For Goal Achievement: 04/23/24 Potential to Achieve Goals: Good ADL Goals Pt/caregiver will Perform Home Exercise Program: Right Upper extremity;With written HEP provided;Independently;Increased ROM;Increased strength Additional ADL Goal #2: Pt will demonstrate understanding of scar management and edema management strategies.  Plan      Co-evaluation                 AM-PAC OT "6 Clicks" Daily Activity     Outcome Measure   Help from another person eating meals?: None Help from another person taking care of personal grooming?: None Help from another person toileting, which includes using toliet, bedpan, or urinal?: None Help from another person bathing (including washing, rinsing, drying)?: None Help from another person to put on and taking off regular upper body clothing?: None Help from another person to put on and taking off regular lower body clothing?: None 6 Click Score: 24    End of Session     OT Visit Diagnosis: Pain;Muscle weakness (generalized) (M62.81) Pain - Right/Left: Right Pain - part of body: Hand   Activity Tolerance Patient tolerated treatment well   Patient Left in bed;with call bell/phone within reach   Nurse Communication Mobility status        Time: 2841-3244 OT Time Calculation (min): 19 min  Charges: OT General Charges $OT Visit: 1 Visit OT Treatments $Therapeutic Exercise: 8-22 mins  Taino Maertens, BS, OTA/S   Luca Dyar 04/16/2024, 1:43 PM

## 2024-04-16 NOTE — Plan of Care (Signed)

## 2024-04-17 DIAGNOSIS — L03119 Cellulitis of unspecified part of limb: Secondary | ICD-10-CM | POA: Diagnosis not present

## 2024-04-17 LAB — VANCOMYCIN, PEAK: Vancomycin Pk: 7 ug/mL — ABNORMAL LOW (ref 30–40)

## 2024-04-17 MED ORDER — VANCOMYCIN HCL 1.25 G IV SOLR
1250.0000 mg | Freq: Three times a day (TID) | INTRAVENOUS | Status: DC
  Administered 2024-04-17 – 2024-04-21 (×12): 1250 mg via INTRAVENOUS
  Filled 2024-04-17 (×2): qty 25
  Filled 2024-04-17: qty 1250
  Filled 2024-04-17 (×11): qty 25

## 2024-04-17 NOTE — Progress Notes (Signed)
 PROGRESS NOTE  Mark Galloway  BJY:[id number] DOB: [birth year] DOA: 04/05/2024 PCP: Bubba Carbo, [name]   Brief Narrative: Patient is a 38 year old male with history of HIV on ART, bipolar disorder, recent right little finger infection who presented with worsening pain, swelling of right little finger.  Orthopedics consulted for the concern of osteomyelitis.  No plan for surgical intervention.  Infectious disease consulted.  Recommended antibiotic for 6 weeks.  History of drug abuse so cannot be sent home with IV antibiotics.  Prolonged  hospitalization to finish the antibiotics course  Assessment & Plan:  Principal Problem:   Cellulitis of hand Active Problems:   Osteomyelitis of right hand ([name])   AIDS ([name])   Paranoid schizophrenia ([name])   Hyponatremia   Bipolar 1 disorder ([name])   Cellulitis   Macrocytic anemia   Nonadherence to medical treatment  Osteomyelitis of the right little finger/cellulitis of right hand: Was recently treated for the same. Patient was noncompliant to treatment.   X-rays on presentation concerning for osteomyelitis.  Orthopedics, ID consulted.  Recent debridement of right fifth finger abscess, cultures positive for MRSA and ESBL E. coli.  Imaging done here did not significant collection.  Currently on vancomycin , ertapenem , plan for 6 weeks of treatment.  Antibiotics will be continued for 05/17/2024.  Recommended for PICC line and home antibiotic administration admitted against  History of HIV/AIDS: Last CD4 count was 163 as per March  6, 25.  Continue home Biktarvy .  Also started on Bactrim  by ID  Macrocytic anemia: Iron panel consistent with iron deficiency, low ferritin.  Continue iron supplementation  Bipolar disorder: On olanzapine         DVT prophylaxis:SCDs Start: 04/05/24 2124     Code Status: Full Code  Family Communication: None at bedside  Patient status:Inpatient  Patient is from :Home  Anticipated discharge YQ:MVHQ  Estimated DC  date:after finishing abx   Consultants: Orthopedics, ID  Procedures: None  Antimicrobials:  Anti-infectives (From admission, onward)    Start     Dose/Rate Route Frequency Ordered Stop   04/17/24 0830  Vancomycin  (VANCOCIN ) 1,250 mg in sodium chloride  0.9 % 250 mL IVPB        1,250 mg 166.7 mL/hr over 90 Minutes Intravenous Every 8 hours 04/17/24 0822     04/16/24 2215  Vancomycin  (VANCOCIN ) 1,250 mg in sodium chloride  0.9 % 250 mL IVPB  Status:  Discontinued        1,250 mg 166.7 mL/hr over 90 Minutes Intravenous Every 8 hours 04/16/24 1704 04/17/24 0822   04/09/24 1400  vancomycin  (VANCOREADY) IVPB 1250 mg/250 mL  Status:  Discontinued        1,250 mg 166.7 mL/hr over 90 Minutes Intravenous Every 8 hours 04/09/24 0819 04/16/24 1704   04/07/24 1000  sulfamethoxazole -trimethoprim  (BACTRIM  DS) 800-160 MG per tablet 1 tablet        1 tablet Oral Daily 04/06/24 1506     04/06/24 1800  vancomycin  (VANCOREADY) IVPB 1500 mg/300 mL  Status:  Discontinued        1,500 mg 150 mL/hr over 120 Minutes Intravenous Every 12 hours 04/06/24 0342 04/09/24 0819   04/06/24 1530  ertapenem  (INVANZ ) 1 g in sodium chloride  0.9 % 100 mL IVPB        1 g 200 mL/hr over 30 Minutes Intravenous Every 24 hours 04/06/24 1442     04/06/24 1000  bictegravir-emtricitabine -tenofovir  AF (BIKTARVY ) 50-200-25 MG per tablet 1 tablet        1 tablet Oral Daily  04/05/24 2124     04/06/24 0400  vancomycin  (VANCOREADY) IVPB 1500 mg/300 mL        1,500 mg 150 mL/hr over 120 Minutes Intravenous  Once 04/06/24 0301 04/06/24 0730   04/06/24 0315  ceFEPIme  (MAXIPIME ) 2 g in sodium chloride  0.9 % 100 mL IVPB  Status:  Discontinued        2 g 200 mL/hr over 30 Minutes Intravenous Every 8 hours 04/06/24 0301 04/06/24 1442       Subjective: Patient seen and examined at bedside today.  Comfortable lying on bed.  Listening music.  Denies any new complaints   Objective: Vitals:   04/16/24 0825 04/16/24 1700 04/17/24 0415  04/17/24 0833  BP: 120/70 124/64 109/62 127/75  Pulse: 100 99 86 95  Resp: 16 16 16 16   Temp: 98.1 F (36.7 C) 97.6 F (36.4 C) 98.6 F (37 C) (!) 97.4 F (36.3 C)  TempSrc: Oral Oral Oral Oral  SpO2: 100% 100% 99% 99%  Weight:      Height:        Intake/Output Summary (Last 24 hours) at 04/17/2024 1040 Last data filed at 04/16/2024 2007 Gross per 24 hour  Intake 700 ml  Output --  Net 700 ml    Filed Weights   04/05/24 1720  Weight: 74.8 kg    Examination:   General exam: Overall comfortable, not in distress HEENT: PERRL Respiratory system:  no wheezes or crackles  Cardiovascular system: S1 & S2 heard, RRR.  Gastrointestinal system: Abdomen is nondistended, soft and nontender. Central nervous system: Alert and oriented Extremities: No edema, no clubbing ,no cyanosis, ulcer on the right little finger Skin: No rashes, no ulcers,no icterus       Data Reviewed: I have personally reviewed following labs and imaging studies  CBC: No results for input(s): "WBC", "NEUTROABS", "HGB", "HCT", "MCV", "PLT" in the last 168 hours.  Basic Metabolic Panel: Recent Labs  Lab 04/11/24 0446 04/14/24 0823 04/16/24 0550  NA 134* 137 136  K 4.0 4.2 4.1  CL 102 106 108  CO2 23 23 22   GLUCOSE 112* 89 97  BUN 14 11 9   CREATININE 0.76 0.74 0.69  CALCIUM  9.7 9.5 9.4     No results found for this or any previous visit (from the past 240 hours).    Radiology Studies: No results found.  Scheduled Meds:  bictegravir-emtricitabine -tenofovir  AF  1 tablet Oral Daily   ferrous sulfate   325 mg Oral Q lunch   OLANZapine  zydis  15 mg Oral BID   sulfamethoxazole -trimethoprim   1 tablet Oral Daily   Continuous Infusions:  ertapenem  Stopped (04/16/24 1002)   vancomycin  1,250 mg (04/17/24 0854)     LOS: 11 days   Leona Rake, MD [facility] P4/18/2025, 10:40 AM

## 2024-04-17 NOTE — Progress Notes (Signed)
 Pharmacy Antibiotic Note  Mark Galloway is a 38 y.o. male admitted on 04/05/2024 with  wound infection .  Pharmacy has been consulted for Vancomycin /Cefepime  dosing.   Dose 1250 Q8H- administered 4/17 2140 Vancomycin  peak checked as a trough- 7 (4/18 0844)>>approximately 3 hours late. Corrected trough level ~10.3. This trough level is consistent with former levels which included a peak and allowed for calculation of AUC which revealed a therapeutic AUC. Due to challenge with obtaining levels reliably, will based doing decision off of trough alone. As is in aligment with previous levels/calculations, will continue dosing as-is. Consider levels in 1 week.   Plan: Continue Vancomycin  1250 mg IV q8h cAUC 448 mcg*hr/mL (trough 10 mcg/mL) Ertapenem  1g IV every 24 hours given hx of ESBL E. coli Trend WBC, temp, renal function  F/U infectious work-up Drug levels as indicated   Height: 5\' 9"  (175.3 cm) Weight: 74.8 kg (165 lb) IBW/kg (Calculated) : 70.7  Temp (24hrs), Avg:97.9 F (36.6 C), Min:97.4 F (36.3 C), Max:98.6 F (37 C)  Recent Labs  Lab 04/10/24 1701 04/10/24 2138 04/11/24 0446 04/14/24 0823 04/16/24 0550 04/17/24 0844  CREATININE  --   --  0.76 0.74 0.69  --   VANCOTROUGH  --  11*  --   --   --   --   VANCOPEAK 23*  --   --   --   --  7*    Estimated Creatinine Clearance: 126.4 mL/min (by C-G formula based on SCr of 0.69 mg/dL).    Chrystie Crass, PharmD Clinical Pharmacist  04/17/2024 12:35 PM

## 2024-04-17 NOTE — Plan of Care (Signed)

## 2024-04-18 DIAGNOSIS — L03119 Cellulitis of unspecified part of limb: Secondary | ICD-10-CM | POA: Diagnosis not present

## 2024-04-18 NOTE — Plan of Care (Signed)

## 2024-04-18 NOTE — *Deleted (Incomplete)
 Pt

## 2024-04-18 NOTE — Progress Notes (Signed)
 PROGRESS NOTE  Mark Galloway  FAO:[id number] DOB: [birth year] DOA: 04/05/2024 PCP: Bubba Carbo, [name]   Brief Narrative: Patient is a 38 year old male with history of HIV on ART, bipolar disorder, recent right little finger infection who presented with worsening pain, swelling of right little finger.  Orthopedics consulted for the concern of osteomyelitis.  No plan for surgical intervention.  Infectious disease consulted.  Recommended antibiotic for 6 weeks.  History of drug abuse so cannot be sent home with IV antibiotics.  Prolonged  hospitalization to finish the antibiotics course  Assessment & Plan:  Principal Problem:   Cellulitis of hand Active Problems:   Osteomyelitis of right hand ([name])   AIDS ([name])   Paranoid schizophrenia ([name])   Hyponatremia   Bipolar 1 disorder ([name])   Cellulitis   Macrocytic anemia   Nonadherence to medical treatment  Osteomyelitis of the right little finger/cellulitis of right hand: Was recently treated for the same. Patient was noncompliant to treatment.   X-rays on presentation concerning for osteomyelitis.  Orthopedics, ID consulted.  Recent debridement of right fifth finger abscess, cultures positive for MRSA and ESBL E. coli.  Imaging done here did not significant collection.  Currently on vancomycin , ertapenem , plan for 6 weeks of treatment.  Antibiotics will be continued for 05/17/2024.  Recommended for PICC line and home antibiotic administration admitted against  History of HIV/AIDS: Last CD4 count was 163 as per March  6, 25.  Continue home Biktarvy .  Also started on Bactrim  by ID  Macrocytic anemia: Iron panel consistent with iron deficiency, low ferritin.  Continue iron supplementation  Bipolar disorder: On olanzapine         DVT prophylaxis:SCDs Start: 04/05/24 2124     Code Status: Full Code  Family Communication: None at bedside  Patient status:Inpatient  Patient is from :Home  Anticipated discharge ON:GEXB  Estimated DC  date:after finishing abx   Consultants: Orthopedics, ID  Procedures: None  Antimicrobials:  Anti-infectives (From admission, onward)    Start     Dose/Rate Route Frequency Ordered Stop   04/17/24 0830  Vancomycin  (VANCOCIN ) 1,250 mg in sodium chloride  0.9 % 250 mL IVPB        1,250 mg 166.7 mL/hr over 90 Minutes Intravenous Every 8 hours 04/17/24 0822     04/16/24 2215  Vancomycin  (VANCOCIN ) 1,250 mg in sodium chloride  0.9 % 250 mL IVPB  Status:  Discontinued        1,250 mg 166.7 mL/hr over 90 Minutes Intravenous Every 8 hours 04/16/24 1704 04/17/24 0822   04/09/24 1400  vancomycin  (VANCOREADY) IVPB 1250 mg/250 mL  Status:  Discontinued        1,250 mg 166.7 mL/hr over 90 Minutes Intravenous Every 8 hours 04/09/24 0819 04/16/24 1704   04/07/24 1000  sulfamethoxazole -trimethoprim  (BACTRIM  DS) 800-160 MG per tablet 1 tablet        1 tablet Oral Daily 04/06/24 1506     04/06/24 1800  vancomycin  (VANCOREADY) IVPB 1500 mg/300 mL  Status:  Discontinued        1,500 mg 150 mL/hr over 120 Minutes Intravenous Every 12 hours 04/06/24 0342 04/09/24 0819   04/06/24 1530  ertapenem  (INVANZ ) 1 g in sodium chloride  0.9 % 100 mL IVPB        1 g 200 mL/hr over 30 Minutes Intravenous Every 24 hours 04/06/24 1442     04/06/24 1000  bictegravir-emtricitabine -tenofovir  AF (BIKTARVY ) 50-200-25 MG per tablet 1 tablet        1 tablet Oral Daily  04/05/24 2124     04/06/24 0400  vancomycin  (VANCOREADY) IVPB 1500 mg/300 mL        1,500 mg 150 mL/hr over 120 Minutes Intravenous  Once 04/06/24 0301 04/06/24 0730   04/06/24 0315  ceFEPIme  (MAXIPIME ) 2 g in sodium chloride  0.9 % 100 mL IVPB  Status:  Discontinued        2 g 200 mL/hr over 30 Minutes Intravenous Every 8 hours 04/06/24 0301 04/06/24 1442       Subjective: Patient seen and examined at bedside today.  He was not in the room.  Again seen the patient around noontime.  He said he had GONE FOR  smoking.  Discussed that his smoking is not allowed  in the hospital and patient cannot go outside without any supervision   Objective: Vitals:   04/17/24 0833 04/17/24 1809 04/17/24 2108 04/18/24 0548  BP: 127/75 125/63 122/69 111/67  Pulse: 95 100 (!) 103 91  Resp: 16 16 16 18   Temp: (!) 97.4 F (36.3 C) 97.8 F (36.6 C) 98.4 F (36.9 C) 98.4 F (36.9 C)  TempSrc: Oral Axillary Oral Oral  SpO2: 99% 98% 97% 97%  Weight:      Height:        Intake/Output Summary (Last 24 hours) at 04/18/2024 1151 Last data filed at 04/17/2024 2021 Gross per 24 hour  Intake 100 ml  Output --  Net 100 ml    Filed Weights   04/05/24 1720  Weight: 74.8 kg    Examination:   General exam: Overall comfortable, not in distress HEENT: PERRL Respiratory system:  no wheezes or crackles  Cardiovascular system: S1 & S2 heard, RRR.  Gastrointestinal system: Abdomen is nondistended, soft and nontender. Central nervous system: Alert and oriented Extremities: No edema, no clubbing ,no cyanosis,right hand covered with dressing Skin: No rashes, no ulcers,no icterus         Data Reviewed: I have personally reviewed following labs and imaging studies  CBC: No results for input(s): "WBC", "NEUTROABS", "HGB", "HCT", "MCV", "PLT" in the last 168 hours.  Basic Metabolic Panel: Recent Labs  Lab 04/14/24 0823 04/16/24 0550  NA 137 136  K 4.2 4.1  CL 106 108  CO2 23 22  GLUCOSE 89 97  BUN 11 9  CREATININE 0.74 0.69  CALCIUM  9.5 9.4     No results found for this or any previous visit (from the past 240 hours).    Radiology Studies: No results found.  Scheduled Meds:  bictegravir-emtricitabine -tenofovir  AF  1 tablet Oral Daily   ferrous sulfate   325 mg Oral Q lunch   OLANZapine  zydis  15 mg Oral BID   sulfamethoxazole -trimethoprim   1 tablet Oral Daily   Continuous Infusions:  ertapenem  Stopped (04/17/24 1137)   vancomycin  1,250 mg (04/18/24 1118)     LOS: 12 days   Leona Rake, MD [facility] P4/19/2025, 11:51 AM

## 2024-04-19 DIAGNOSIS — L03119 Cellulitis of unspecified part of limb: Secondary | ICD-10-CM | POA: Diagnosis not present

## 2024-04-19 NOTE — Plan of Care (Signed)

## 2024-04-19 NOTE — Progress Notes (Signed)
 PROGRESS NOTE  Pranay Hilbun  ZOX:[id number] DOB: [birth year] DOA: 04/05/2024 PCP: Bubba Carbo, [name]   Brief Narrative: Patient is a 38 year old male with history of HIV on ART, bipolar disorder, recent right little finger infection who presented with worsening pain, swelling of right little finger.  Orthopedics consulted for the concern of osteomyelitis.  No plan for surgical intervention.  Infectious disease consulted.  Recommended antibiotic for 6 weeks.  History of drug abuse so cannot be sent home with IV antibiotics.  Prolonged  hospitalization to finish the antibiotics course  Assessment & Plan:  Principal Problem:   Cellulitis of hand Active Problems:   Osteomyelitis of right hand ([name])   AIDS ([name])   Paranoid schizophrenia ([name])   Hyponatremia   Bipolar 1 disorder ([name])   Cellulitis   Macrocytic anemia   Nonadherence to medical treatment  Osteomyelitis of the right little finger/cellulitis of right hand: Was recently treated for the same. Patient was noncompliant to treatment.   X-rays on presentation concerning for osteomyelitis.  Orthopedics, ID consulted.  Recent debridement of right fifth finger abscess, cultures positive for MRSA and ESBL E. coli.  Imaging done here did not significant collection.  Currently on vancomycin , ertapenem , plan for 6 weeks of treatment.  Antibiotics will be continued for 05/17/2024.  Recommended for PICC line and home antibiotic administration admitted against  History of HIV/AIDS: Last CD4 count was 163 as per March  6, 25.  Continue home Biktarvy .  Also started on Bactrim  by ID  Macrocytic anemia: Iron panel consistent with iron deficiency, low ferritin.  Continue iron supplementation  Bipolar disorder: On olanzapine         DVT prophylaxis:SCDs Start: 04/05/24 2124     Code Status: Full Code  Family Communication: None at bedside  Patient status:Inpatient  Patient is from :Home  Anticipated discharge WJ:XBJY  Estimated DC  date:after finishing abx   Consultants: Orthopedics, ID  Procedures: None  Antimicrobials:  Anti-infectives (From admission, onward)    Start     Dose/Rate Route Frequency Ordered Stop   04/17/24 0830  Vancomycin  (VANCOCIN ) 1,250 mg in sodium chloride  0.9 % 250 mL IVPB        1,250 mg 166.7 mL/hr over 90 Minutes Intravenous Every 8 hours 04/17/24 0822     04/16/24 2215  Vancomycin  (VANCOCIN ) 1,250 mg in sodium chloride  0.9 % 250 mL IVPB  Status:  Discontinued        1,250 mg 166.7 mL/hr over 90 Minutes Intravenous Every 8 hours 04/16/24 1704 04/17/24 0822   04/09/24 1400  vancomycin  (VANCOREADY) IVPB 1250 mg/250 mL  Status:  Discontinued        1,250 mg 166.7 mL/hr over 90 Minutes Intravenous Every 8 hours 04/09/24 0819 04/16/24 1704   04/07/24 1000  sulfamethoxazole -trimethoprim  (BACTRIM  DS) 800-160 MG per tablet 1 tablet        1 tablet Oral Daily 04/06/24 1506     04/06/24 1800  vancomycin  (VANCOREADY) IVPB 1500 mg/300 mL  Status:  Discontinued        1,500 mg 150 mL/hr over 120 Minutes Intravenous Every 12 hours 04/06/24 0342 04/09/24 0819   04/06/24 1530  ertapenem  (INVANZ ) 1 g in sodium chloride  0.9 % 100 mL IVPB        1 g 200 mL/hr over 30 Minutes Intravenous Every 24 hours 04/06/24 1442     04/06/24 1000  bictegravir-emtricitabine -tenofovir  AF (BIKTARVY ) 50-200-25 MG per tablet 1 tablet        1 tablet Oral Daily  04/05/24 2124     04/06/24 0400  vancomycin  (VANCOREADY) IVPB 1500 mg/300 mL        1,500 mg 150 mL/hr over 120 Minutes Intravenous  Once 04/06/24 0301 04/06/24 0730   04/06/24 0315  ceFEPIme  (MAXIPIME ) 2 g in sodium chloride  0.9 % 100 mL IVPB  Status:  Discontinued        2 g 200 mL/hr over 30 Minutes Intravenous Every 8 hours 04/06/24 0301 04/06/24 1442       Subjective: Patient seen and examined at bedside today.  Hemodynamically stable.  Comfortably sitting on the bed.  Eating his breakfast.  No new complaints   Objective: Vitals:   04/18/24 1449  04/18/24 2033 04/19/24 0505 04/19/24 0810  BP: 129/66 120/66 (!) 108/53 123/69  Pulse: 95 (!) 105 96 90  Resp: 17 18 18 18   Temp: 99.1 F (37.3 C) 98.7 F (37.1 C) 98.6 F (37 C) 98.1 F (36.7 C)  TempSrc: Oral Oral Oral Oral  SpO2: 98% 99% 99% 99%  Weight:      Height:        Intake/Output Summary (Last 24 hours) at 04/19/2024 1117 Last data filed at 04/18/2024 1426 Gross per 24 hour  Intake 300 ml  Output --  Net 300 ml    Filed Weights   04/05/24 1720  Weight: 74.8 kg    Examination:   General exam: Overall comfortable, not in distress HEENT: PERRL Respiratory system:  no wheezes or crackles  Cardiovascular system: S1 & S2 heard, RRR.  Gastrointestinal system: Abdomen is nondistended, soft and nontender. Central nervous system: Alert and oriented Extremities: No edema, no clubbing ,no cyanosis,right hand covered with dressing Skin: No rashes, no ulcers,no icterus        Data Reviewed: I have personally reviewed following labs and imaging studies  CBC: No results for input(s): "WBC", "NEUTROABS", "HGB", "HCT", "MCV", "PLT" in the last 168 hours.  Basic Metabolic Panel: Recent Labs  Lab 04/14/24 0823 04/16/24 0550  NA 137 136  K 4.2 4.1  CL 106 108  CO2 23 22  GLUCOSE 89 97  BUN 11 9  CREATININE 0.74 0.69  CALCIUM  9.5 9.4     No results found for this or any previous visit (from the past 240 hours).    Radiology Studies: No results found.  Scheduled Meds:  bictegravir-emtricitabine -tenofovir  AF  1 tablet Oral Daily   ferrous sulfate   325 mg Oral Q lunch   OLANZapine  zydis  15 mg Oral BID   sulfamethoxazole -trimethoprim   1 tablet Oral Daily   Continuous Infusions:  ertapenem  1 g (04/19/24 1034)   vancomycin  1,250 mg (04/19/24 0515)     LOS: 13 days   Leona Rake, MD [facility] P4/20/2025, 11:17 AM

## 2024-04-20 ENCOUNTER — Telehealth (HOSPITAL_COMMUNITY): Payer: Self-pay | Admitting: Pharmacy Technician

## 2024-04-20 ENCOUNTER — Other Ambulatory Visit (HOSPITAL_COMMUNITY): Payer: Self-pay

## 2024-04-20 DIAGNOSIS — L03119 Cellulitis of unspecified part of limb: Secondary | ICD-10-CM | POA: Diagnosis not present

## 2024-04-20 LAB — CBC WITH DIFFERENTIAL/PLATELET
Abs Immature Granulocytes: 0.02 10*3/uL (ref 0.00–0.07)
Basophils Absolute: 0 10*3/uL (ref 0.0–0.1)
Basophils Relative: 0 %
Eosinophils Absolute: 0.3 10*3/uL (ref 0.0–0.5)
Eosinophils Relative: 4 %
HCT: 34 % — ABNORMAL LOW (ref 39.0–52.0)
Hemoglobin: 11.4 g/dL — ABNORMAL LOW (ref 13.0–17.0)
Immature Granulocytes: 0 %
Lymphocytes Relative: 38 %
Lymphs Abs: 2.5 10*3/uL (ref 0.7–4.0)
MCH: 32.6 pg (ref 26.0–34.0)
MCHC: 33.5 g/dL (ref 30.0–36.0)
MCV: 97.1 fL (ref 80.0–100.0)
Monocytes Absolute: 0.9 10*3/uL (ref 0.1–1.0)
Monocytes Relative: 15 %
Neutro Abs: 2.7 10*3/uL (ref 1.7–7.7)
Neutrophils Relative %: 43 %
Platelets: 332 10*3/uL (ref 150–400)
RBC: 3.5 MIL/uL — ABNORMAL LOW (ref 4.22–5.81)
RDW: 14.9 % (ref 11.5–15.5)
WBC: 6.4 10*3/uL (ref 4.0–10.5)
nRBC: 0 % (ref 0.0–0.2)

## 2024-04-20 LAB — BASIC METABOLIC PANEL WITH GFR
Anion gap: 7 (ref 5–15)
BUN: 7 mg/dL (ref 6–20)
CO2: 24 mmol/L (ref 22–32)
Calcium: 9.3 mg/dL (ref 8.9–10.3)
Chloride: 104 mmol/L (ref 98–111)
Creatinine, Ser: 0.74 mg/dL (ref 0.61–1.24)
GFR, Estimated: >60 mL/min (ref >60)
Glucose, Bld: 98 mg/dL (ref 70–99)
Potassium: 3.9 mmol/L (ref 3.5–5.1)
Sodium: 135 mmol/L (ref 135–145)

## 2024-04-20 LAB — SEDIMENTATION RATE: Sed Rate: 105 mm/h — ABNORMAL HIGH (ref 0–16)

## 2024-04-20 LAB — C-REACTIVE PROTEIN: CRP: 7 mg/dL — ABNORMAL HIGH (ref <1.0)

## 2024-04-20 MED ORDER — HYDROMORPHONE HCL 1 MG/ML IJ SOLN
0.5000 mg | Freq: Once | INTRAMUSCULAR | Status: AC | PRN
  Administered 2024-04-20: 0.5 mg via INTRAVENOUS
  Filled 2024-04-20: qty 0.5

## 2024-04-20 NOTE — Progress Notes (Signed)
 PROGRESS NOTE  Mark Galloway  JOA:[id number] DOB: 02/11/1986 DOA: 04/05/2024 PCP: Bubba Carbo, [name]   Brief Narrative: Patient is a 38 year old male with history of HIV on ART, bipolar disorder, recent right little finger infection who presented with worsening pain, swelling of right little finger.  Orthopedics consulted for the concern of osteomyelitis.  No plan for surgical intervention.  Infectious disease consulted.  Recommended antibiotic for 6 weeks.  History of drug abuse so cannot be sent home with IV antibiotics.  Prolonged  hospitalization to finish the antibiotics course  Assessment & Plan:  Principal Problem:   Cellulitis of hand Active Problems:   Osteomyelitis of right hand ([name])   AIDS ([name])   Paranoid schizophrenia ([name])   Hyponatremia   Bipolar 1 disorder ([name])   Cellulitis   Macrocytic anemia   Nonadherence to medical treatment  Osteomyelitis of the right little finger/cellulitis of right hand: Was recently treated for the same. Patient was noncompliant to treatment.   X-rays on presentation concerning for osteomyelitis.  Orthopedics, ID consulted.  Recent debridement of right fifth finger abscess, cultures positive for MRSA and ESBL E. coli.  Imaging done here did not significant collection.  Currently on vancomycin , ertapenem , plan for 6 weeks of treatment.  Antibiotics will be continued for 05/17/2024.  Recommended for PICC line and home antibiotic administration admitted against  History of HIV/AIDS: Last CD4 count was 163 as per March  6, 25.  Continue home Biktarvy .  Also started on Bactrim  by ID  Macrocytic anemia: Iron panel consistent with iron deficiency, low ferritin.  Continue iron supplementation  Bipolar disorder: On olanzapine         DVT prophylaxis:SCDs Start: 04/05/24 2124     Code Status: Full Code  Family Communication: None at bedside  Patient status:Inpatient  Patient is from :Home  Anticipated discharge SW:FUXN  Estimated DC  date:after finishing abx   Consultants: Orthopedics, ID  Procedures: None  Antimicrobials:  Anti-infectives (From admission, onward)    Start     Dose/Rate Route Frequency Ordered Stop   04/17/24 0830  Vancomycin  (VANCOCIN ) 1,250 mg in sodium chloride  0.9 % 250 mL IVPB        1,250 mg 166.7 mL/hr over 90 Minutes Intravenous Every 8 hours 04/17/24 0822     04/16/24 2215  Vancomycin  (VANCOCIN ) 1,250 mg in sodium chloride  0.9 % 250 mL IVPB  Status:  Discontinued        1,250 mg 166.7 mL/hr over 90 Minutes Intravenous Every 8 hours 04/16/24 1704 04/17/24 0822   04/09/24 1400  vancomycin  (VANCOREADY) IVPB 1250 mg/250 mL  Status:  Discontinued        1,250 mg 166.7 mL/hr over 90 Minutes Intravenous Every 8 hours 04/09/24 0819 04/16/24 1704   04/07/24 1000  sulfamethoxazole -trimethoprim  (BACTRIM  DS) 800-160 MG per tablet 1 tablet        1 tablet Oral Daily 04/06/24 1506     04/06/24 1800  vancomycin  (VANCOREADY) IVPB 1500 mg/300 mL  Status:  Discontinued        1,500 mg 150 mL/hr over 120 Minutes Intravenous Every 12 hours 04/06/24 0342 04/09/24 0819   04/06/24 1530  ertapenem  (INVANZ ) 1 g in sodium chloride  0.9 % 100 mL IVPB        1 g 200 mL/hr over 30 Minutes Intravenous Every 24 hours 04/06/24 1442     04/06/24 1000  bictegravir-emtricitabine -tenofovir  AF (BIKTARVY ) 50-200-25 MG per tablet 1 tablet        1 tablet Oral Daily  04/05/24 2124     04/06/24 0400  vancomycin  (VANCOREADY) IVPB 1500 mg/300 mL        1,500 mg 150 mL/hr over 120 Minutes Intravenous  Once 04/06/24 0301 04/06/24 0730   04/06/24 0315  ceFEPIme  (MAXIPIME ) 2 g in sodium chloride  0.9 % 100 mL IVPB  Status:  Discontinued        2 g 200 mL/hr over 30 Minutes Intravenous Every 8 hours 04/06/24 0301 04/06/24 1442       Subjective: Patient seen at bedside today.  Comfortably sitting at the edge of the bed, eating his breakfast.  No new complaints   Objective: Vitals:   04/19/24 1509 04/19/24 2056 04/20/24 0556  04/20/24 0815  BP: 124/66 138/71 121/76 111/60  Pulse: 99 99 92 93  Resp: 20 18 18 18   Temp: 98.4 F (36.9 C) 98.9 F (37.2 C) 98.4 F (36.9 C) 98.8 F (37.1 C)  TempSrc: Oral Oral Oral Oral  SpO2: 98% 98% 100% 99%  Weight:      Height:        Intake/Output Summary (Last 24 hours) at 04/20/2024 1154 Last data filed at 04/20/2024 6045 Gross per 24 hour  Intake 863.02 ml  Output --  Net 863.02 ml    Filed Weights   04/05/24 1720  Weight: 74.8 kg    Examination:  General exam: Overall comfortable, not in distress HEENT: PERRL Respiratory system:  no wheezes or crackles  Cardiovascular system: S1 & S2 heard, RRR.  Gastrointestinal system: Abdomen is nondistended, soft and nontender. Central nervous system: Alert and oriented Extremities: No edema, no clubbing ,no cyanosis, dressing on the right hand Skin: No rashes, no ulcers,no icterus        Data Reviewed: I have personally reviewed following labs and imaging studies  CBC: Recent Labs  Lab 04/20/24 1039  WBC 6.4  NEUTROABS 2.7  HGB 11.4*  HCT 34.0*  MCV 97.1  PLT 332    Basic Metabolic Panel: Recent Labs  Lab 04/14/24 0823 04/16/24 0550 04/20/24 0659  NA 137 136 135  K 4.2 4.1 3.9  CL 106 108 104  CO2 23 22 24   GLUCOSE 89 97 98  BUN 11 9 7   CREATININE 0.74 0.69 0.74  CALCIUM  9.5 9.4 9.3     No results found for this or any previous visit (from the past 240 hours).    Radiology Studies: No results found.  Scheduled Meds:  bictegravir-emtricitabine -tenofovir  AF  1 tablet Oral Daily   ferrous sulfate   325 mg Oral Q lunch   OLANZapine  zydis  15 mg Oral BID   sulfamethoxazole -trimethoprim   1 tablet Oral Daily   Continuous Infusions:  ertapenem  1 g (04/20/24 1120)   vancomycin  1,250 mg (04/20/24 0629)     LOS: 14 days   Leona Rake, MD [facility] P4/21/2025, 11:54 AM

## 2024-04-20 NOTE — Plan of Care (Signed)

## 2024-04-20 NOTE — Progress Notes (Signed)
 Occupational Therapy Treatment Patient Details Name: Mark Galloway MRN: [id number] DOB: 01-03-1986 Today's Date: 04/20/2024   History of present illness Pt is a 38 yo male admitted to [facility] on 04/05/24 with worsening swelling and pain in the R hand little finger. PMH of HIV, schizophrenia, bipolar disorder, I &D R little finger.   OT comments  Pt agreeable to session. Pt wound healing however scar tissue is very thick and preventing much ROM in little finger. HEP for flexion and extension performed both active and passively to MCP, PIP, and DIP joints as well as nerve glides. Pt concerned about scab forming over scar tissue, reporting that is limiting his motion even further. Acute OT to continue to follow to address established goals to facilitate DC to next venue of care.        If plan is discharge home, recommend the following:  Other (comment) (PRN)   Equipment Recommendations  None recommended by OT    Recommendations for Other Services      Precautions / Restrictions Precautions Precautions: None Restrictions Weight Bearing Restrictions Per Provider Order: No       Mobility Bed Mobility Overal bed mobility: Independent             General bed mobility comments: in bed for duration of session    Transfers Overall transfer level: Independent                       Balance                                           ADL either performed or assessed with clinical judgement   ADL Overall ADL's : Independent                                       General ADL Comments: Pt completes ADL tasks with no assist    Extremity/Trunk Assessment              Vision       Perception     Praxis     Communication Communication Communication: No apparent difficulties   Cognition Arousal: Alert Behavior During Therapy: WFL for tasks assessed/performed Cognition: No apparent impairments                                Following commands: Intact        Cueing   Cueing Techniques: Verbal cues  Exercises Exercises: Other exercises Other Exercises Other Exercises: Tendon Glides following HEP Other Exercises: DIP/PIP flexion and extension R little finger Other Exercises: Isolated finger ext with palm supported RUE Other Exercises: Palm flat, finger abduction/adduction    Shoulder Instructions       General Comments wound closed, pt reporting concern over thickness of wound and scar tissue    Pertinent Vitals/ Pain       Pain Assessment Pain Assessment: No/denies pain Pain Intervention(s): Monitored during session  Home Living                                          Prior Functioning/Environment  Frequency  Min 4X/week        Progress Toward Goals  OT Goals(current goals can now be found in the care plan section)  Progress towards OT goals: Progressing toward goals  Acute Rehab OT Goals Patient Stated Goal: increase range OT Goal Formulation: With patient Time For Goal Achievement: 04/23/24 Potential to Achieve Goals: Good ADL Goals Pt/caregiver will Perform Home Exercise Program: Right Upper extremity;With written HEP provided;Independently;Increased ROM;Increased strength Additional ADL Goal #2: Pt will demonstrate understanding of scar management and edema management strategies.  Plan      Co-evaluation                 AM-PAC OT "6 Clicks" Daily Activity     Outcome Measure   Help from another person eating meals?: None Help from another person taking care of personal grooming?: None Help from another person toileting, which includes using toliet, bedpan, or urinal?: None Help from another person bathing (including washing, rinsing, drying)?: None Help from another person to put on and taking off regular upper body clothing?: None Help from another person to put on and taking off regular lower body clothing?: None 6  Click Score: 24    End of Session    OT Visit Diagnosis: Pain;Muscle weakness (generalized) (M62.81) Pain - Right/Left: Right Pain - part of body: Hand   Activity Tolerance Patient tolerated treatment well   Patient Left in bed;with call bell/phone within reach   Nurse Communication Mobility status        Time: 0272-5366 OT Time Calculation (min): 17 min  Charges: OT General Charges $OT Visit: 1 Visit OT Treatments $Therapeutic Exercise: 8-22 mins  Madonna Flegal, BS, OTA/S   Rayla Pember 04/20/2024, 1:46 PM

## 2024-04-20 NOTE — Progress Notes (Signed)
 [facility] night cross cover note:   I was notified by RN of the patient's request for IV Dilaudid  for pain relating to his right fifth finger, noting that his prior order for prn IV Dilaudid  is expired.  Per brief chart review, appears to is hospitalized with cellulitis/osteomyelitis involving the fifth finger on the right hand.  I subsequently ordered Dilaudid  0.5 mg IV x 1 dose prn.     Camelia Cavalier, DO Hospitalist

## 2024-04-20 NOTE — Plan of Care (Signed)
  Problem: Education: Goal: Knowledge of General Education information will improve Description: Including pain rating scale, medication(s)/side effects and non-pharmacologic comfort measures Outcome: Progressing   Problem: Health Behavior/Discharge Planning: Goal: Ability to manage health-related needs will improve Outcome: Progressing   Problem: Clinical Measurements: Goal: Ability to maintain clinical measurements within normal limits will improve Outcome: Progressing Goal: Diagnostic test results will improve Outcome: Progressing   Problem: Activity: Goal: Risk for activity intolerance will decrease Outcome: Progressing   

## 2024-04-20 NOTE — Telephone Encounter (Signed)
 Patient Product/process development scientist completed.    The patient is insured through [employer] [place]. Patient has Medicare and is not eligible for a copay card, but may be able to apply for patient assistance or Medicare RX Payment Plan (Patient Must reach out to their plan, if eligible for payment plan), if available.    Ran test claim for Biktarvy  and the current 30 day co-pay is $4.80.  Ran test claim for linezolid  600 mg and the current 14 day co-pay is $1.60.  This test claim was processed through [facility]- copay amounts may vary at other pharmacies due to pharmacy/plan contracts, or as the patient moves through the different stages of their insurance plan.     Morgan Arab, CPHT Pharmacy Technician III Certified Patient Advocate [facility] Patient Advocate Team Direct Number: [phone]  Fax: [phone]

## 2024-04-21 ENCOUNTER — Other Ambulatory Visit (HOSPITAL_COMMUNITY): Payer: Self-pay

## 2024-04-21 DIAGNOSIS — Z1612 Extended spectrum beta lactamase (ESBL) resistance: Secondary | ICD-10-CM | POA: Diagnosis not present

## 2024-04-21 DIAGNOSIS — L03119 Cellulitis of unspecified part of limb: Secondary | ICD-10-CM | POA: Diagnosis not present

## 2024-04-21 DIAGNOSIS — M86149 Other acute osteomyelitis, unspecified hand: Secondary | ICD-10-CM | POA: Diagnosis not present

## 2024-04-21 DIAGNOSIS — B961 Klebsiella pneumoniae [K. pneumoniae] as the cause of diseases classified elsewhere: Secondary | ICD-10-CM

## 2024-04-21 DIAGNOSIS — B9562 Methicillin resistant Staphylococcus aureus infection as the cause of diseases classified elsewhere: Secondary | ICD-10-CM | POA: Diagnosis not present

## 2024-04-21 MED ORDER — LINEZOLID 600 MG PO TABS
600.0000 mg | ORAL_TABLET | Freq: Two times a day (BID) | ORAL | Status: DC
  Administered 2024-04-21 – 2024-04-24 (×7): 600 mg via ORAL
  Filled 2024-04-21 (×7): qty 1

## 2024-04-21 MED ORDER — LINEZOLID 600 MG PO TABS
600.0000 mg | ORAL_TABLET | Freq: Two times a day (BID) | ORAL | 0 refills | Status: AC
  Filled 2024-04-21: qty 28, 14d supply, fill #0

## 2024-04-21 MED ORDER — SULFAMETHOXAZOLE-TRIMETHOPRIM 800-160 MG PO TABS
1.0000 | ORAL_TABLET | Freq: Every day | ORAL | 0 refills | Status: AC
  Filled 2024-04-21: qty 30, 30d supply, fill #0

## 2024-04-21 MED ORDER — BIKTARVY 50-200-25 MG PO TABS
1.0000 | ORAL_TABLET | Freq: Every day | ORAL | 0 refills | Status: DC
  Filled 2024-04-21: qty 30, 30d supply, fill #0

## 2024-04-21 NOTE — Progress Notes (Signed)
 Occupational Therapy Treatment Patient Details Name: Mark Galloway MRN: [id number] DOB: Jul 12, 1986 Today's Date: 04/21/2024   History of present illness Pt is a 38 yo male admitted to [facility] on 04/05/24 with worsening swelling and pain in the R hand little finger. PMH of HIV, schizophrenia, bipolar disorder, I &D R little finger.   OT comments  Pt received in supine and agreeable to therapy. This session pt introduced to moist heat modalities for aiding scar management. Moist heat cloth applied to scar tissue for approximately 15 minutes, pt reporting that it felt good on his finger and that he felt his finger felt better following. Manual scar massage performed on pt little finger following modality. HEP completed to address flexion/ext of MCP, PIP, and DIP joints; slight increase in range noted this date. Tendon glides completed. Pt left in bed with all needs met. Acute OT to continue to follow to address established goals to facilitate DC to next venue of care.        If plan is discharge home, recommend the following:  Other (comment) (PRN)   Equipment Recommendations  None recommended by OT    Recommendations for Other Services      Precautions / Restrictions Precautions Precautions: None Recall of Precautions/Restrictions: Intact Restrictions Weight Bearing Restrictions Per Provider Order: No       Mobility Bed Mobility                    Transfers Overall transfer level: Independent                       Balance                                           ADL either performed or assessed with clinical judgement   ADL Overall ADL's : Independent                                            Extremity/Trunk Assessment              Vision       Perception     Praxis     Communication Communication Communication: No apparent difficulties   Cognition Arousal: Alert Behavior During Therapy: WFL for tasks  assessed/performed Cognition: No apparent impairments                               Following commands: Intact        Cueing   Cueing Techniques: Verbal cues  Exercises Exercises: Other exercises Other Exercises Other Exercises: Tendon Glides following HEP Other Exercises: DIP/PIP flexion and extension R little finger Other Exercises: Isolated finger ext with palm supported RUE Other Exercises: Palm flat, finger abduction/adduction    Shoulder Instructions       General Comments pt introduced to moist heat for scar management and enjoyed the heat and reported feeling a little better following    Pertinent Vitals/ Pain       Pain Assessment Pain Assessment: No/denies pain Pain Intervention(s): Monitored during session  Home Living  Prior Functioning/Environment              Frequency  Min 4X/week        Progress Toward Goals  OT Goals(current goals can now be found in the care plan section)  Progress towards OT goals: Progressing toward goals  Acute Rehab OT Goals Patient Stated Goal: increase range OT Goal Formulation: With patient Time For Goal Achievement: 04/23/24 Potential to Achieve Goals: Good ADL Goals Pt/caregiver will Perform Home Exercise Program: Right Upper extremity;With written HEP provided;Independently;Increased ROM;Increased strength Additional ADL Goal #2: Pt will demonstrate understanding of scar management and edema management strategies.  Plan      Co-evaluation                 AM-PAC OT "6 Clicks" Daily Activity     Outcome Measure   Help from another person eating meals?: None Help from another person taking care of personal grooming?: None Help from another person toileting, which includes using toliet, bedpan, or urinal?: None Help from another person bathing (including washing, rinsing, drying)?: None Help from another person to put on and  taking off regular upper body clothing?: None Help from another person to put on and taking off regular lower body clothing?: None 6 Click Score: 24    End of Session    OT Visit Diagnosis: Pain;Muscle weakness (generalized) (M62.81)   Activity Tolerance Patient tolerated treatment well   Patient Left in bed;with call bell/phone within reach   Nurse Communication Mobility status        Time: 1191-4782 OT Time Calculation (min): 38 min  Charges: OT General Charges $OT Visit: 1 Visit OT Treatments $Therapeutic Exercise: 38-52 mins  Teddrick Mallari, BS, OTA/S   Verdine Grenfell 04/21/2024, 2:37 PM

## 2024-04-21 NOTE — Plan of Care (Signed)

## 2024-04-21 NOTE — TOC Progression Note (Signed)
 Transition of Care [facility]) - Progression Note    Patient Details  Name: Mark Galloway MRN: [id number] Date of Birth: 1986-06-09  Transition of Care [facility]) CM/SW Contact  Eusebio High, RN Phone Number: 04/21/2024, 2:20 PM  Clinical Narrative:     RNCM spoke with patient regarding upcoming DC plan. Patient gave RNCM permission to contact his Mother, whom he wanted to dc to. Mom stated that she was in a hotel that Patient is currently banned from but she felt confident she could get her Son in to [facility]. Mother stated that if patient had any follow up appointments in the next 30-60 days, he could not go to [facility] as this is inpatient rehab .  RNCM reached out to treatment team. Was told that he would definitely need follow up within the above timeframe due to this being a surgical patient and two recent hospitalizations R/T the same issue.   RNCM has asked CSW for suggestions. CSW stated that she had given patient a list of Shelters at the beginning of his admission. RNCM contacted patient and he stated he had contacted [facility] and that they could take him. He really wanted [facility] but if he has to have those follow up appointments he said he would go to [facility].   CSW and team updated           Expected Discharge Plan: Homeless Shelter Barriers to Discharge: Continued Medical Work up  Expected Discharge Plan and Services       Living arrangements for the past 2 months: Single Family Home                                       Social Determinants of Health (SDOH) Interventions SDOH Screenings   Food Insecurity: Food Insecurity Present (04/06/2024)  Housing: High Risk (04/06/2024)  Transportation Needs: Unmet Transportation Needs (04/06/2024)  Utilities: At Risk (04/06/2024)  Alcohol Screen: Low Risk  (03/14/2024)  Depression (PHQ2-9): Low Risk  (03/08/2023)  Financial Resource Strain: High Risk (01/04/2024)   [facility]  Social Connections: Unknown  (05/04/2022)   [facility]  Tobacco Use: High Risk (04/09/2024)    Readmission Risk Interventions     No data to display

## 2024-04-21 NOTE — Progress Notes (Signed)
 [facility]  Date of Admission:  04/05/2024      Total days of antibiotics   Ertapenem  4/7 >> c   Vancomycin             ASSESSMENT: Mark Galloway is a 38 y.o. male admitted with:   Cellulitis / Osteomyelitis Hand  MRSA, ESBL Klebsiella -  This week he will have had 3 weeks of coverage for ESBL producing bacteria. Given he had surgery intervention and no signs of worsening during hospitalization this is likely sufficient  We discussed continuing with linezolid  for another 2 weeks at discharge to cover MRSA.  - ertapenem  1 gm IV daily to continue through Friday  - Linezolid  600 mg BID to start now to assess tolerability  - Stop vancomycin   - CRP / ESR Thursday AM   HIV -  AIDS, CD4 163 -  VL controlled in march at only 80 copies. Continue bactrim  prophylaxis for CD4 < 200.  FU VL/CD4 in 3 months to reassess.  Multiple barriers to care discussed below - [facility] referral with [facility]   Medication Monitoring -  PLT > 300 baseline  Needs repeat CBC in outpatient setting   SUD -  He would really like to consider residential program at [facility] however I don't think he is medically ready to avoid follow up for 1-2 months. Being this is his second hospital stay for the hand infection he needs at least one more follow up with hand surgery and ID in community before released ideally   Homeless -  Ongoing discussions with social work / [facility] team - complicated situation with his mother's hotel not being able to keep him there ('banned with previous behavior'). He will need clinic referral to [facility] for resources re: substance use, housing, food insecurity, HIV medication adherence and transportation assistance.   PLAN: Continue biktarvy  daily  Continue bactrim  prophylaxis daily  Continue ertapenem  through Friday  Housing plan pending - appreciate TOC/LCSW team for resources  CRP/ESR Thursday AM  FU appt with Trevor Fudge in ID clinic May 8th @  2:00 pm   Principal Problem:   Cellulitis of hand Active Problems:   Bipolar 1 disorder ([name])   AIDS ([name])   Cellulitis   Paranoid schizophrenia ([name])   Hyponatremia   Osteomyelitis of right hand ([name])   Macrocytic anemia   Nonadherence to medical treatment    bictegravir-emtricitabine -tenofovir  AF  1 tablet Oral Daily   ferrous sulfate   325 mg Oral Q lunch   OLANZapine  zydis  15 mg Oral BID   sulfamethoxazole -trimethoprim   1 tablet Oral Daily    SUBJECTIVE: Hand is feeling much improved. Pain has dulled a lot and he feels like things are coming along.   Review of Systems: ROS  Allergies  Allergen Reactions   Peanut Oil Hives   Peanut-Containing Drug Products Hives    OBJECTIVE: Vitals:   04/20/24 1659 04/20/24 2002 04/21/24 0542 04/21/24 0756  BP: 120/87 121/72 (!) 106/59 120/62  Pulse: 93 (!) 102 87 86  Resp: 18 20 18 18   Temp: 97.7 F (36.5 C) 99.1 F (37.3 C) (!) 97.4 F (36.3 C) 98 F (36.7 C)  TempSrc: Oral Oral Oral   SpO2: 98% 99% 97% 97%  Weight:      Height:       Body mass index is 24.37 kg/m.  Physical Exam Vitals reviewed.  Constitutional:      Appearance: Normal appearance. He is  not ill-appearing.  HENT:     Head: Normocephalic.     Mouth/Throat:     Mouth: Mucous membranes are moist.     Pharynx: Oropharynx is clear.  Eyes:     General: No scleral icterus. Cardiovascular:     Rate and Rhythm: Normal rate and regular rhythm.  Pulmonary:     Effort: Pulmonary effort is normal.  Musculoskeletal:        General: Normal range of motion.     Cervical back: Normal range of motion.     Comments: Rt hand wrapped with clean dry gauze dressing. Good flexibility and mobility of joint.   Skin:    Coloration: Skin is not jaundiced or pale.  Neurological:     Mental Status: He is alert and oriented to person, place, and time.  Psychiatric:        Mood and Affect: Mood normal.        Judgment: Judgment normal.     Lab Results Lab  Results  Component Value Date   WBC 6.4 04/20/2024   HGB 11.4 (L) 04/20/2024   HCT 34.0 (L) 04/20/2024   MCV 97.1 04/20/2024   PLT 332 04/20/2024    Lab Results  Component Value Date   CREATININE 0.74 04/20/2024   BUN 7 04/20/2024   NA 135 04/20/2024   K 3.9 04/20/2024   CL 104 04/20/2024   CO2 24 04/20/2024    Lab Results  Component Value Date   ALT 29 04/05/2024   AST 31 04/05/2024   ALKPHOS 72 04/05/2024   BILITOT 0.5 04/05/2024     Microbiology: No results found for this or any previous visit (from the past 240 hours).  Gibson Kurtz, MSN, NP-C [facility] for Infectious Disease [facility] Pager: [phone]  @TODAY @ 10:59 AM

## 2024-04-21 NOTE — Progress Notes (Addendum)
 PROGRESS NOTE  Mark Galloway  HQI:[id number] DOB: [birth year] DOA: 04/05/2024 PCP: Bubba Carbo, [name]   Brief Narrative: Patient is a 38 year old male with history of HIV on ART, bipolar disorder, recent right little finger infection who presented with worsening pain, swelling of right little finger.  Orthopedics consulted for the concern of osteomyelitis.  No plan for surgical intervention.  Infectious disease consulted.  Recommended antibiotic for 6 weeks.  History of drug abuse so cannot be sent home with IV antibiotics.  Prolonged  hospitalization to finish the antibiotics course  Assessment & Plan:  Principal Problem:   Cellulitis of hand Active Problems:   Osteomyelitis of right hand ([name])   AIDS ([name])   Paranoid schizophrenia ([name])   Hyponatremia   Bipolar 1 disorder ([name])   Cellulitis   Macrocytic anemia   Nonadherence to medical treatment  Osteomyelitis of the right little finger/cellulitis of right hand: Was recently treated for the same. Patient was noncompliant to treatment.   X-rays on presentation concerning for osteomyelitis.  Orthopedics, ID consulted.  Recent debridement of right fifth finger abscess, cultures positive for MRSA and ESBL E. coli.  Imaging done here did not significant collection.  Currently on vancomycin , ertapenem , plan for 6 weeks of treatment.  Antibiotics planned to be  continued till 05/17/2024. Received  message from ID today that they might be finding alternative plan for oral antibiotic.  History of HIV/AIDS: Last CD4 count was 163 as per March  6, 25.  Continue home Biktarvy .  Also started on Bactrim  by ID  Macrocytic anemia: Iron panel consistent with iron deficiency, low ferritin.  Continue iron supplementation  Bipolar disorder: On olanzapine   ADDENDUM:  Antibiotics changed to oral.  Started on linezolid .  Patient will make arrangement to live with his mom soon.  ID recommending him to continue keeping him here till Friday.  Likely  discharge on Friday.  Patient is homeless           DVT prophylaxis:SCDs Start: 04/05/24 2124     Code Status: Full Code  Family Communication: None at bedside  Patient status:Inpatient  Patient is from :Home  Anticipated discharge XL:[facility]  Estimated DC date:04/24/24   Consultants: Orthopedics, ID  Procedures: None  Antimicrobials:  Anti-infectives (From admission, onward)    Start     Dose/Rate Route Frequency Ordered Stop   04/17/24 0830  Vancomycin  (VANCOCIN ) 1,250 mg in sodium chloride  0.9 % 250 mL IVPB        1,250 mg 166.7 mL/hr over 90 Minutes Intravenous Every 8 hours 04/17/24 0822     04/16/24 2215  Vancomycin  (VANCOCIN ) 1,250 mg in sodium chloride  0.9 % 250 mL IVPB  Status:  Discontinued        1,250 mg 166.7 mL/hr over 90 Minutes Intravenous Every 8 hours 04/16/24 1704 04/17/24 0822   04/09/24 1400  vancomycin  (VANCOREADY) IVPB 1250 mg/250 mL  Status:  Discontinued        1,250 mg 166.7 mL/hr over 90 Minutes Intravenous Every 8 hours 04/09/24 0819 04/16/24 1704   04/07/24 1000  sulfamethoxazole -trimethoprim  (BACTRIM  DS) 800-160 MG per tablet 1 tablet        1 tablet Oral Daily 04/06/24 1506     04/06/24 1800  vancomycin  (VANCOREADY) IVPB 1500 mg/300 mL  Status:  Discontinued        1,500 mg 150 mL/hr over 120 Minutes Intravenous Every 12 hours 04/06/24 0342 04/09/24 0819   04/06/24 1530  ertapenem  (INVANZ ) 1 g in sodium chloride  0.9 %  100 mL IVPB        1 g 200 mL/hr over 30 Minutes Intravenous Every 24 hours 04/06/24 1442     04/06/24 1000  bictegravir-emtricitabine -tenofovir  AF (BIKTARVY ) 50-200-25 MG per tablet 1 tablet        1 tablet Oral Daily 04/05/24 2124     04/06/24 0400  vancomycin  (VANCOREADY) IVPB 1500 mg/300 mL        1,500 mg 150 mL/hr over 120 Minutes Intravenous  Once 04/06/24 0301 04/06/24 0730   04/06/24 0315  ceFEPIme  (MAXIPIME ) 2 g in sodium chloride  0.9 % 100 mL IVPB  Status:  Discontinued        2 g 200 mL/hr over 30 Minutes  Intravenous Every 8 hours 04/06/24 0301 04/06/24 1442       Subjective: Patient seen and examined at bedside today.  Hemodynamically stable.  Comfortably lying in bed.  Eating his breakfast.  Denies any complaints.   Objective: Vitals:   04/20/24 1659 04/20/24 2002 04/21/24 0542 04/21/24 0756  BP: 120/87 121/72 (!) 106/59 120/62  Pulse: 93 (!) 102 87 86  Resp: 18 20 18 18   Temp: 97.7 F (36.5 C) 99.1 F (37.3 C) (!) 97.4 F (36.3 C) 98 F (36.7 C)  TempSrc: Oral Oral Oral   SpO2: 98% 99% 97% 97%  Weight:      Height:        Intake/Output Summary (Last 24 hours) at 04/21/2024 1129 Last data filed at 04/20/2024 1813 Gross per 24 hour  Intake 472 ml  Output --  Net 472 ml    Filed Weights   04/05/24 1720  Weight: 74.8 kg    Examination:   General exam: Overall comfortable, not in distress, HEENT: PERRL Respiratory system:  no wheezes or crackles  Cardiovascular system: S1 & S2 heard, RRR.  Gastrointestinal system: Abdomen is nondistended, soft and nontender. Central nervous system: Alert and oriented Extremities: Dressing on the right hand Skin: No rashes, no ulcers,no icterus          Data Reviewed: I have personally reviewed following labs and imaging studies  CBC: Recent Labs  Lab 04/20/24 1039  WBC 6.4  NEUTROABS 2.7  HGB 11.4*  HCT 34.0*  MCV 97.1  PLT 332    Basic Metabolic Panel: Recent Labs  Lab 04/16/24 0550 04/20/24 0659  NA 136 135  K 4.1 3.9  CL 108 104  CO2 22 24  GLUCOSE 97 98  BUN 9 7  CREATININE 0.69 0.74  CALCIUM  9.4 9.3     No results found for this or any previous visit (from the past 240 hours).    Radiology Studies: No results found.  Scheduled Meds:  bictegravir-emtricitabine -tenofovir  AF  1 tablet Oral Daily   ferrous sulfate   325 mg Oral Q lunch   OLANZapine  zydis  15 mg Oral BID   sulfamethoxazole -trimethoprim   1 tablet Oral Daily   Continuous Infusions:  ertapenem  1 g (04/21/24 0926)   vancomycin  1,250  mg (04/21/24 0510)     LOS: 15 days   Leona Rake, MD [facility] P4/22/2025, 11:29 AM

## 2024-04-22 ENCOUNTER — Other Ambulatory Visit (HOSPITAL_COMMUNITY): Payer: Self-pay

## 2024-04-22 DIAGNOSIS — L03119 Cellulitis of unspecified part of limb: Secondary | ICD-10-CM | POA: Diagnosis not present

## 2024-04-22 NOTE — Plan of Care (Signed)

## 2024-04-22 NOTE — TOC Progression Note (Signed)
 Transition of Care [facility]) - Progression Note    Patient Details  Name: Mark Galloway MRN: [id number] Date of Birth: 17-May-1986  Transition of Care [facility]) CM/SW Contact  Juliane Och, LCSW Phone Number: 04/22/2024, 10:29 AM  Clinical Narrative:     10:29 AM Per yesterday covering RNCM, patient expressed interest in discharging to substance use residential treatment facility. CSW provided list of substance use residential treatment facilities at bedside and encourage patient to call prior to discharge. Patient expressed understanding of CSW suggestion. CSW added substance use resources to patient's AVS, as well.  Expected Discharge Plan: Homeless Shelter (or substance use residential treatment facility) Barriers to Discharge: Continued Medical Work up, Homeless with medical needs  Expected Discharge Plan and Services In-house Referral: Clinical Social Work   Post Acute Care Choice:  (substance use residential treatment facility) Living arrangements for the past 2 months: Homeless Shelter, Homeless, Single Family Home                                       Social Determinants of Health (SDOH) Interventions SDOH Screenings   Food Insecurity: Food Insecurity Present (04/06/2024)  Housing: High Risk (04/06/2024)  Transportation Needs: Unmet Transportation Needs (04/06/2024)  Utilities: At Risk (04/06/2024)  Alcohol Screen: Low Risk  (03/14/2024)  Depression (PHQ2-9): Low Risk  (03/08/2023)  Financial Resource Strain: High Risk (01/04/2024)   [facility] from [facility]  Social Connections: Unknown (05/04/2022)   [facility]  Tobacco Use: High Risk (04/09/2024)    Readmission Risk Interventions     No data to display

## 2024-04-22 NOTE — Progress Notes (Signed)
 PROGRESS NOTE  Mark Galloway  ZOX:[id number] DOB: 31-May-1986 DOA: 04/05/2024 PCP: Bubba Carbo, [name]   Brief Narrative: Patient is a 38 year old male with history of HIV on ART, bipolar disorder, recent right little finger infection who presented with worsening pain, swelling of right little finger.  Orthopedics consulted for the concern of osteomyelitis.  No plan for surgical intervention.  Infectious disease consulted.  Recommended antibiotic for 6 weeks.  History of drug abuse so cudnt  be sent  with IV antibiotics.  Prolonged  hospitalization to finish the antibiotics course.  Abx changed to oral. ID recommending him to continue keeping him here till Friday.   Patient is homeless. [facility]  Assessment & Plan:  Principal Problem:   Cellulitis of hand Active Problems:   Osteomyelitis of right hand ([name])   AIDS ([name])   Paranoid schizophrenia ([name])   Hyponatremia   Bipolar 1 disorder ([name])   Cellulitis   Macrocytic anemia   Nonadherence to medical treatment  Osteomyelitis of the right little finger/cellulitis of right hand: Was recently treated for the same. Patient was noncompliant to treatment.   X-rays on presentation concerning for osteomyelitis.  Orthopedics, ID consulted.  Recent debridement of right fifth finger abscess, cultures positive for MRSA and ESBL E. coli.  Imaging done here did not significant collection.  Currently on vancomycin , ertapenem ,there was  plan for 6 weeks of treatment.  ID changed antibiotic to oral.Now on linezolid   History of HIV/AIDS: Last CD4 count was 163 as per March  6, 25.  Continue home Biktarvy .  Also started on Bactrim  by ID  Macrocytic anemia: Iron panel consistent with iron deficiency, low ferritin.  Continue iron supplementation  Bipolar disorder: On olanzapine            DVT prophylaxis:SCDs Start: 04/05/24 2124     Code Status: Full Code  Family Communication: None at bedside  Patient  status:Inpatient  Patient is from :Home  Anticipated discharge to:Shelter  Estimated DC date:04/24/24   Consultants: Orthopedics, ID  Procedures: None  Antimicrobials:  Anti-infectives (From admission, onward)    Start     Dose/Rate Route Frequency Ordered Stop   04/21/24 1230  linezolid  (ZYVOX ) tablet 600 mg        600 mg Oral Every 12 hours 04/21/24 1138     04/21/24 0000  linezolid  (ZYVOX ) 600 MG tablet        600 mg Oral 2 times daily 04/21/24 1137 05/05/24 2359   04/21/24 0000  bictegravir-emtricitabine -tenofovir  AF (BIKTARVY ) 50-200-25 MG TABS tablet        1 tablet Oral Daily 04/21/24 1137     04/21/24 0000  sulfamethoxazole -trimethoprim  (BACTRIM  DS) 800-160 MG tablet        1 tablet Oral Daily 04/21/24 1137 05/21/24 2359   04/17/24 0830  Vancomycin  (VANCOCIN ) 1,250 mg in sodium chloride  0.9 % 250 mL IVPB  Status:  Discontinued        1,250 mg 166.7 mL/hr over 90 Minutes Intravenous Every 8 hours 04/17/24 0822 04/21/24 1138   04/16/24 2215  Vancomycin  (VANCOCIN ) 1,250 mg in sodium chloride  0.9 % 250 mL IVPB  Status:  Discontinued        1,250 mg 166.7 mL/hr over 90 Minutes Intravenous Every 8 hours 04/16/24 1704 04/17/24 0822   04/09/24 1400  vancomycin  (VANCOREADY) IVPB 1250 mg/250 mL  Status:  Discontinued        1,250 mg 166.7 mL/hr over 90 Minutes Intravenous Every 8  hours 04/09/24 0819 04/16/24 1704   04/07/24 1000  sulfamethoxazole -trimethoprim  (BACTRIM  DS) 800-160 MG per tablet 1 tablet        1 tablet Oral Daily 04/06/24 1506     04/06/24 1800  vancomycin  (VANCOREADY) IVPB 1500 mg/300 mL  Status:  Discontinued        1,500 mg 150 mL/hr over 120 Minutes Intravenous Every 12 hours 04/06/24 0342 04/09/24 0819   04/06/24 1530  ertapenem  (INVANZ ) 1 g in sodium chloride  0.9 % 100 mL IVPB  Status:  Discontinued        1 g 200 mL/hr over 30 Minutes Intravenous Every 24 hours 04/06/24 1442 04/21/24 1138   04/06/24 1000  bictegravir-emtricitabine -tenofovir  AF (BIKTARVY )  50-200-25 MG per tablet 1 tablet        1 tablet Oral Daily 04/05/24 2124     04/06/24 0400  vancomycin  (VANCOREADY) IVPB 1500 mg/300 mL        1,500 mg 150 mL/hr over 120 Minutes Intravenous  Once 04/06/24 0301 04/06/24 0730   04/06/24 0315  ceFEPIme  (MAXIPIME ) 2 g in sodium chloride  0.9 % 100 mL IVPB  Status:  Discontinued        2 g 200 mL/hr over 30 Minutes Intravenous Every 8 hours 04/06/24 0301 04/06/24 1442       Subjective: Patient seen and examined at bedside today.  Hemodynamically stable.  Overall comfortable, sitting in the chair.  No new complaints   Objective: Vitals:   04/21/24 1714 04/21/24 1957 04/22/24 0418 04/22/24 0906  BP: 117/72 112/65 121/65 115/69  Pulse: 92 92 82 95  Resp:  16 16 16   Temp: 98 F (36.7 C) 98.6 F (37 C) 98.5 F (36.9 C) 98.5 F (36.9 C)  TempSrc:    Oral  SpO2: 99% 95% 97% 98%  Weight:      Height:       No intake or output data in the 24 hours ending 04/22/24 1122   Filed Weights   04/05/24 1720  Weight: 74.8 kg    Examination:   General exam: Overall comfortable, not in distress, HEENT: PERRL Respiratory system:  no wheezes or crackles  Cardiovascular system: S1 & S2 heard, RRR.  Gastrointestinal system: Abdomen is nondistended, soft and nontender. Central nervous system: Alert and oriented Extremities: Healing ulcer on the right little finger Skin: No rashes, no ulcers,no icterus          Data Reviewed: I have personally reviewed following labs and imaging studies  CBC: Recent Labs  Lab 04/20/24 1039  WBC 6.4  NEUTROABS 2.7  HGB 11.4*  HCT 34.0*  MCV 97.1  PLT 332    Basic Metabolic Panel: Recent Labs  Lab 04/16/24 0550 04/20/24 0659  NA 136 135  K 4.1 3.9  CL 108 104  CO2 22 24  GLUCOSE 97 98  BUN 9 7  CREATININE 0.69 0.74  CALCIUM  9.4 9.3     No results found for this or any previous visit (from the past 240 hours).    Radiology Studies: No results found.  Scheduled Meds:   bictegravir-emtricitabine -tenofovir  AF  1 tablet Oral Daily   ferrous sulfate   325 mg Oral Q lunch   linezolid   600 mg Oral Q12H   OLANZapine  zydis  15 mg Oral BID   sulfamethoxazole -trimethoprim   1 tablet Oral Daily   Continuous Infusions:     LOS: 16 days   Leona Rake, MD [facility] P4/23/2025, 11:22 AM

## 2024-04-23 ENCOUNTER — Other Ambulatory Visit (HOSPITAL_COMMUNITY): Payer: Self-pay

## 2024-04-23 DIAGNOSIS — L03119 Cellulitis of unspecified part of limb: Secondary | ICD-10-CM | POA: Diagnosis not present

## 2024-04-23 LAB — BASIC METABOLIC PANEL WITH GFR
Anion gap: 10 (ref 5–15)
BUN: 10 mg/dL (ref 6–20)
CO2: 22 mmol/L (ref 22–32)
Calcium: 9.7 mg/dL (ref 8.9–10.3)
Chloride: 104 mmol/L (ref 98–111)
Creatinine, Ser: 0.72 mg/dL (ref 0.61–1.24)
GFR, Estimated: >60 mL/min (ref >60)
Glucose, Bld: 97 mg/dL (ref 70–99)
Potassium: 4.3 mmol/L (ref 3.5–5.1)
Sodium: 136 mmol/L (ref 135–145)

## 2024-04-23 NOTE — TOC Progression Note (Addendum)
 Transition of Care [facility]) - Progression Note    Patient Details  Name: Mark Galloway MRN: [id number] Date of Birth: 10/01/1986  Transition [name]) CM/SW Contact  Jeani Mill, RN Phone Number: 04/23/2024, 12:23 PM  Clinical Narrative:    E[facility]ora Greet to patient regarding OP rehab. Patient is agreeable to go [address]  Referral sent and information added to AVS. Patient will need taxi voucher [facility].  Taxi wavier signed on 03/28/24  Expected Discharge Plan: Homeless Shelter (or substance use residential treatment facility) Barriers to Discharge: Continued Medical Work up, Homeless with medical needs  Expected Discharge Plan and Services In-house Referral: Clinical Social Work   Post Acute Care Choice:  (substance use residential treatment facility) Living arrangements for the past 2 months: Homeless Shelter, Homeless, Single Family Home                                       Social Determinants of Health (SDOH) Interventions SDOH Screenings   Food Insecurity: Food Insecurity Present (04/06/2024)  Housing: High Risk (04/06/2024)  Transportation Needs: Unmet Transportation Needs (04/06/2024)  Utilities: At Risk (04/06/2024)  Alcohol Screen: Low Risk  (03/14/2024)  Depression (PHQ2-9): Low Risk  (03/08/2023)  Financial Resource Strain: High Risk (01/04/2024)   [facility]  Social Connections: Unknown (05/04/2022)   [facility]  Tobacco Use: High Risk (04/09/2024)    Readmission Risk Interventions     No data to display

## 2024-04-23 NOTE — Progress Notes (Signed)
 PROGRESS NOTE  Mark Galloway  NGE:[id number] DOB: June 19, 1986 DOA: 04/05/2024 PCP: Bubba Carbo, [name]   Brief Narrative: Patient is a 38 year old male with history of HIV on ART, bipolar disorder, recent right little finger infection who presented with worsening pain, swelling of right little finger.  Orthopedics consulted for the concern of osteomyelitis.  No plan for surgical intervention.  Infectious disease consulted.  Recommended antibiotic for 6 weeks.  History of drug abuse so cudnt  be sent  with IV antibiotics.  Prolonged  hospitalization to finish the antibiotics course.  Abx changed to oral. ID recommending him to continue keeping him here till Friday.   Patient is homeless. He has a bed [facility]  Assessment & Plan:  Principal Problem:   Cellulitis of hand Active Problems:   Osteomyelitis of right hand ([name])   AIDS ([name])   Paranoid schizophrenia ([name])   Hyponatremia   Bipolar 1 disorder ([name])   Cellulitis   Macrocytic anemia   Nonadherence to medical treatment  Osteomyelitis of the right little finger/cellulitis of right hand: Was recently treated for the same. Patient was noncompliant to treatment.   X-rays on presentation concerning for osteomyelitis.  Orthopedics, ID consulted.  Recent debridement of right fifth finger abscess, cultures positive for MRSA and ESBL E. coli.  Imaging done here did not significant collection.  Currently on vancomycin , ertapenem ,there was  plan for 6 weeks of treatment.  ID changed antibiotic to oral.Now on linezolid   History of HIV/AIDS: Last CD4 count was 163 as per March  6, 25.  Continue home Biktarvy .  Also started on Bactrim  by ID  Macrocytic anemia: Iron panel consistent with iron deficiency, low ferritin.  Continue iron supplementation  Bipolar disorder: On olanzapine            DVT prophylaxis:SCDs Start: 04/05/24 2124     Code Status: Full Code  Family Communication: None at bedside  Patient  status:Inpatient  Patient is from :Home  Anticipated discharge to:Shelter  Estimated DC date:04/24/24   Consultants: Orthopedics, ID  Procedures: None  Antimicrobials:  Anti-infectives (From admission, onward)    Start     Dose/Rate Route Frequency Ordered Stop   04/21/24 1230  linezolid  (ZYVOX ) tablet 600 mg        600 mg Oral Every 12 hours 04/21/24 1138     04/21/24 0000  linezolid  (ZYVOX ) 600 MG tablet        600 mg Oral 2 times daily 04/21/24 1137 05/05/24 2359   04/21/24 0000  bictegravir-emtricitabine -tenofovir  AF (BIKTARVY ) 50-200-25 MG TABS tablet        1 tablet Oral Daily 04/21/24 1137     04/21/24 0000  sulfamethoxazole -trimethoprim  (BACTRIM  DS) 800-160 MG tablet        1 tablet Oral Daily 04/21/24 1137 05/21/24 2359   04/17/24 0830  Vancomycin  (VANCOCIN ) 1,250 mg in sodium chloride  0.9 % 250 mL IVPB  Status:  Discontinued        1,250 mg 166.7 mL/hr over 90 Minutes Intravenous Every 8 hours 04/17/24 0822 04/21/24 1138   04/16/24 2215  Vancomycin  (VANCOCIN ) 1,250 mg in sodium chloride  0.9 % 250 mL IVPB  Status:  Discontinued        1,250 mg 166.7 mL/hr over 90 Minutes Intravenous Every 8 hours 04/16/24 1704 04/17/24 0822   04/09/24 1400  vancomycin  (VANCOREADY) IVPB 1250 mg/250 mL  Status:  Discontinued        1,250 mg 166.7 mL/hr over 90 Minutes Intravenous Every 8  hours 04/09/24 0819 04/16/24 1704   04/07/24 1000  sulfamethoxazole -trimethoprim  (BACTRIM  DS) 800-160 MG per tablet 1 tablet        1 tablet Oral Daily 04/06/24 1506     04/06/24 1800  vancomycin  (VANCOREADY) IVPB 1500 mg/300 mL  Status:  Discontinued        1,500 mg 150 mL/hr over 120 Minutes Intravenous Every 12 hours 04/06/24 0342 04/09/24 0819   04/06/24 1530  ertapenem  (INVANZ ) 1 g in sodium chloride  0.9 % 100 mL IVPB  Status:  Discontinued        1 g 200 mL/hr over 30 Minutes Intravenous Every 24 hours 04/06/24 1442 04/21/24 1138   04/06/24 1000  bictegravir-emtricitabine -tenofovir  AF (BIKTARVY )  50-200-25 MG per tablet 1 tablet        1 tablet Oral Daily 04/05/24 2124     04/06/24 0400  vancomycin  (VANCOREADY) IVPB 1500 mg/300 mL        1,500 mg 150 mL/hr over 120 Minutes Intravenous  Once 04/06/24 0301 04/06/24 0730   04/06/24 0315  ceFEPIme  (MAXIPIME ) 2 g in sodium chloride  0.9 % 100 mL IVPB  Status:  Discontinued        2 g 200 mL/hr over 30 Minutes Intravenous Every 8 hours 04/06/24 0301 04/06/24 1442       Subjective: Patient seen and examined at bedside today.  Hemodynamically stable.  Comfortably sitting on the bed.  No new complaints   Objective: Vitals:   04/22/24 1814 04/22/24 2000 04/23/24 0332 04/23/24 0802  BP: 128/74 112/64 118/71 116/71  Pulse: 100 98 88 93  Resp: 18 16 16    Temp: 98.5 F (36.9 C) 98.6 F (37 C) 98.1 F (36.7 C)   TempSrc:      SpO2: 98% 97% 99% 99%  Weight:      Height:        Intake/Output Summary (Last 24 hours) at 04/23/2024 1055 Last data filed at 04/23/2024 0600 Gross per 24 hour  Intake 570 ml  Output --  Net 570 ml     Filed Weights   04/05/24 1720  Weight: 74.8 kg    Examination:    General exam: Overall comfortable, not in distress HEENT: PERRL Respiratory system:  no wheezes or crackles  Cardiovascular system: S1 & S2 heard, RRR.  Gastrointestinal system: Abdomen is nondistended, soft and nontender. Central nervous system: Alert and oriented Extremities: No edema, no clubbing ,no cyanosis, healing ulcer on the right little finger Skin: No rashes, no ulcers,no icterus    Data Reviewed: I have personally reviewed following labs and imaging studies  CBC: Recent Labs  Lab 04/20/24 1039  WBC 6.4  NEUTROABS 2.7  HGB 11.4*  HCT 34.0*  MCV 97.1  PLT 332    Basic Metabolic Panel: Recent Labs  Lab 04/20/24 0659 04/23/24 0550  NA 135 136  K 3.9 4.3  CL 104 104  CO2 24 22  GLUCOSE 98 97  BUN 7 10  CREATININE 0.74 0.72  CALCIUM  9.3 9.7     No results found for this or any previous visit (from  the past 240 hours).    Radiology Studies: No results found.  Scheduled Meds:  bictegravir-emtricitabine -tenofovir  AF  1 tablet Oral Daily   ferrous sulfate   325 mg Oral Q lunch   linezolid   600 mg Oral Q12H   OLANZapine  zydis  15 mg Oral BID   sulfamethoxazole -trimethoprim   1 tablet Oral Daily   Continuous Infusions:     LOS: 17 days  Leona Rake, MD [facility] P4/24/2025, 10:55 AM

## 2024-04-23 NOTE — Plan of Care (Signed)

## 2024-04-23 NOTE — Progress Notes (Signed)
 Occupational Therapy Treatment Patient Details Name: Mark Galloway MRN: [id number] DOB: 1986/06/25 Today's Date: 04/23/2024   History of present illness Pt is a 38 yo male admitted to [facility] on 04/05/24 with worsening swelling and pain in the R hand little finger. PMH of HIV, schizophrenia, bipolar disorder, I &D R little finger.   OT comments  Pt received supine and agreeable to session. Moist heat applied to scar again this session to aid breaking up scar adhesions. Pt reporting he feels more movement in finger. Manual scar massage performed on pt little finger following modality, pt educated on self scar massage techniques. HEP completed to address flexion/ext of MCP, PIP, and DIP joints. Pt performed nerve glides for 10 reps. Left with all needs met. Acute OT to continue to follow to address established goals to facilitate DC to next venue of care.        If plan is discharge home, recommend the following:  Other (comment)   Equipment Recommendations  None recommended by OT    Recommendations for Other Services      Precautions / Restrictions Precautions Precautions: None Restrictions Weight Bearing Restrictions Per Provider Order: No       Mobility Bed Mobility Overal bed mobility: Independent             General bed mobility comments: in bed for duration of session    Transfers Overall transfer level: Independent                       Balance                                           ADL either performed or assessed with clinical judgement   ADL Overall ADL's : Independent                                       General ADL Comments: Pt completes ADL tasks with no assist    Extremity/Trunk Assessment Upper Extremity Assessment Upper Extremity Assessment: Right hand dominant RUE Deficits / Details: DIP and PIP on right 5th finger contracted . increased flexion but continues to me limited and edemous             Vision       Perception     Praxis     Communication Communication Communication: No apparent difficulties   Cognition Arousal: Alert Behavior During Therapy: WFL for tasks assessed/performed Cognition: No apparent impairments                               Following commands: Intact        Cueing   Cueing Techniques: Verbal cues  Exercises Exercises: Other exercises Other Exercises Other Exercises: Tendon Glides following HEP Other Exercises: DIP/PIP flexion and extension R little finger Other Exercises: Isolated finger ext with palm supported RUE Other Exercises: Palm flat, finger abduction/adduction    Shoulder Instructions       General Comments scar tissue extremely hard in little finger, wound appears to be healing well    Pertinent Vitals/ Pain       Pain Assessment Pain Assessment: No/denies pain Pain Intervention(s): Monitored during session  Home Living  Prior Functioning/Environment              Frequency  Min 4X/week        Progress Toward Goals  OT Goals(current goals can now be found in the care plan section)  Progress towards OT goals: Progressing toward goals  Acute Rehab OT Goals Patient Stated Goal: none stated OT Goal Formulation: With patient Time For Goal Achievement: 04/23/24 Potential to Achieve Goals: Good ADL Goals Pt/caregiver will Perform Home Exercise Program: Right Upper extremity;With written HEP provided;Independently;Increased ROM;Increased strength Additional ADL Goal #2: Pt will demonstrate understanding of scar management and edema management strategies.  Plan      Co-evaluation                 AM-PAC OT "6 Clicks" Daily Activity     Outcome Measure   Help from another person eating meals?: None Help from another person taking care of personal grooming?: None Help from another person toileting, which includes using toliet,  bedpan, or urinal?: None Help from another person bathing (including washing, rinsing, drying)?: None Help from another person to put on and taking off regular upper body clothing?: None Help from another person to put on and taking off regular lower body clothing?: None 6 Click Score: 24    End of Session    OT Visit Diagnosis: Pain;Muscle weakness (generalized) (M62.81) Pain - Right/Left: Right Pain - part of body: Hand   Activity Tolerance Patient tolerated treatment well   Patient Left in bed;with call bell/phone within reach   Nurse Communication Mobility status        Time: 4098-1191 OT Time Calculation (min): 26 min  Charges: OT General Charges $OT Visit: 1 Visit OT Treatments $Therapeutic Exercise: 23-37 mins  Alisi Lupien, BS, OTA/S   Mark Galloway 04/23/2024, 1:43 PM

## 2024-04-24 ENCOUNTER — Other Ambulatory Visit (HOSPITAL_COMMUNITY): Payer: Self-pay

## 2024-04-24 DIAGNOSIS — L03119 Cellulitis of unspecified part of limb: Secondary | ICD-10-CM | POA: Diagnosis not present

## 2024-04-24 MED ORDER — OLANZAPINE 15 MG PO TABS
15.0000 mg | ORAL_TABLET | Freq: Two times a day (BID) | ORAL | 0 refills | Status: DC
  Filled 2024-04-24: qty 30, 15d supply, fill #0
  Filled 2024-04-24: qty 60, 30d supply, fill #0

## 2024-04-24 MED ORDER — FERROUS SULFATE 325 (65 FE) MG PO TABS
325.0000 mg | ORAL_TABLET | Freq: Every day | ORAL | 0 refills | Status: AC
  Filled 2024-04-24: qty 60, 60d supply, fill #0

## 2024-04-24 NOTE — Discharge Summary (Signed)
 Physician Discharge Summary  Mark Galloway ZOX:[id number] DOB: June 23, 1986 DOA: 04/05/2024  PCP: Bubba Carbo, [name]  Admit date: 04/05/2024 Discharge date: 04/24/2024  Admitted From: Home Disposition:Shelter  Discharge Condition:Stable CODE STATUS:FULL Diet recommendation: Regular   Brief/Interim Summary: Patient is a 38 year old male with history of HIV on ART, bipolar disorder, recent right little finger infection who presented with worsening pain, swelling of right little finger.  Orthopedics consulted for the concern of osteomyelitis.  No plan for surgical intervention.  Infectious disease consulted.  Recommended antibiotic for 6 weeks.  History of drug abuse so cudnt  be sent  with IV antibiotics.   Abx changed to oral. ID recommending him to continue keeping him here till Friday.   Patient is homeless. He has a bed at [facility] today.  Medically stable for discharge  Following problems were addressed during the hospitalization:  Osteomyelitis of the right little finger/cellulitis of right hand: Was recently treated for the same. Patient was noncompliant to treatment.   X-rays on presentation concerning for osteomyelitis.  Orthopedics, ID consulted.  Recent debridement of right fifth finger abscess, cultures positive for MRSA and ESBL E. coli.  Imaging done here did not significant collection.  He was  on vancomycin , ertapenem ,there was  plan for 6 weeks of treatment.  ID changed antibiotic to oral.Now on linezolid    History of HIV/AIDS: Last CD4 count was 163 as per March  6, 25.  Continue home Biktarvy .  Also started on Bactrim  by ID   Macrocytic anemia: Iron panel consistent with iron deficiency, low ferritin.  Continue iron supplementation   Bipolar disorder: On olanzapine    Discharge Diagnoses:  Principal Problem:   Cellulitis of hand Active Problems:   Osteomyelitis of right hand ([name])   AIDS ([name])   Paranoid schizophrenia ([name])   Hyponatremia   Bipolar 1  disorder ([name])   Cellulitis   Macrocytic anemia   Nonadherence to medical treatment    Discharge Instructions  Discharge Instructions     Ambulatory referral to Occupational Therapy   Complete by: As directed    Ambulatory referral to Occupational Therapy   Complete by: As directed    Diet - low sodium heart healthy   Complete by: As directed    Discharge instructions   Complete by: As directed    1)Please take prescribed medications as instructed 2)Follow up with ID and orthopedics as an outpatient.   Increase activity slowly   Complete by: As directed    No wound care   Complete by: As directed       Allergies as of 04/24/2024       Reactions   Peanut Oil Hives   Peanut-containing Drug Products Hives        Medication List     STOP taking these medications    hydrALAZINE  50 MG tablet Commonly known as: APRESOLINE    melatonin 5 MG Tabs   propranolol  10 MG tablet Commonly known as: INDERAL        TAKE these medications    Biktarvy  50-200-25 MG Tabs tablet Generic drug: bictegravir-emtricitabine -tenofovir  AF Take 1 tablet by mouth daily.   ferrous sulfate  325 (65 FE) MG tablet Take 1 tablet (325 mg total) by mouth daily with lunch.   linezolid  600 MG tablet Commonly known as: ZYVOX  Take 1 tablet (600 mg total) by mouth 2 (two) times daily for 14 days.   nicotine  21 mg/24hr patch Commonly known as: NICODERM CQ  - dosed in mg/24 hours Place 1 patch (21  mg total) onto the skin daily.   OLANZapine  zydis 15 MG disintegrating tablet Commonly known as: ZYPREXA  Take 1 tablet (15 mg total) by mouth 2 (two) times daily.   sulfamethoxazole -trimethoprim  800-160 MG tablet Commonly known as: Bactrim  DS Take 1 tablet by mouth daily. What changed: when to take this        Follow-up Information     [facility] Follow up.   Why: Please call and schedule a intake or speak with them regarding assistance for HIV medications Contact  information: [address] [place] [address] [phone]         [facility]. Schedule an appointment as soon as possible for a visit.   Specialty: Rehabilitation Why: Call to schedule apt for occupational thearpy Contact information: [address] [place]  [address] [phone]               Allergies  Allergen Reactions   Peanut Oil Hives   Peanut-Containing Drug Products Hives    Consultations: ID   Procedures/Studies: US  RT UPPER EXTREM LTD SOFT TISSUE NON VASCULAR Result Date: 04/10/2024 CLINICAL DATA:  Finger abscess EXAM: ULTRASOUND right UPPER EXTREMITY LIMITED TECHNIQUE: Ultrasound examination of the upper extremity soft tissues was performed in the area of clinical concern. COMPARISON:  None Available. FINDINGS: Targeted sonography of the right fifth digit is performed in the region clinical concern. Soft tissue thickening with hyperemia. No discrete focal fluid collection by ultrasound. IMPRESSION: Soft tissue thickening/edema with hyperemia in the right fifth digit. No discrete focal fluid collection by ultrasound. Electronically Signed   By: Esmeralda Hedge M.D.   On: 04/10/2024 03:48   DG Foot 2 Views Left Result Date: 04/06/2024 CLINICAL DATA:  92319.  Cellulitis. EXAM: LEFT FOOT - 2 VIEW COMPARISON:  Study of 03/21/2024 FINDINGS: Mild edema in the soft tissues. This is improved from the prior study. No soft tissue gas is seen or radiopaque foreign body. The underlying bones are unremarkable. There is normal bone mineralization with no evidence of fracture, dislocation or focal pathologic process. IMPRESSION: Mild edema in the soft tissues, improved from the prior study. No soft tissue gas or radiopaque foreign body. No acute osseous abnormality. Electronically Signed   By: Denman Fischer M.D.   On: 04/06/2024 07:54   DG Chest Port 1 View Result Date: 04/06/2024 CLINICAL DATA:  141880 SOB (shortness of breath)  141880 EXAM: PORTABLE CHEST 1 VIEW COMPARISON:  Chest x-ray 04/13/2023 FINDINGS: The heart and mediastinal contours are unchanged. No focal consolidation. No pulmonary edema. No pleural effusion. No pneumothorax. No acute osseous abnormality. IMPRESSION: No active disease. Electronically Signed   By: Morgane  Naveau M.D.   On: 04/06/2024 03:13   DG Finger Little Right Result Date: 04/05/2024 CLINICAL DATA:  Nonhealing wound on anterior right little finger. History is spider bite. EXAM: RIGHT LITTLE FINGER 2+V COMPARISON:  03/17/2024. FINDINGS: Periosteal elevation is noted along the mid and proximal phalanx of the fifth digit with a associated bony erosions. Lucency is noted in the head of the middle phalanx on image 2, possible nondisplaced fracture. There is diffuse soft tissue swelling of the fifth digit. No radiopaque foreign body is seen. IMPRESSION: Periosteal elevation involving in the proximal and mid phalanx of the fifth digit, concerning for osteomyelitis. There is a linear lucency in the head of the middle phalanx on image 2, possible nondisplaced fracture. Electronically Signed   By: Wyvonnia Heimlich M.D.   On: 04/05/2024 18:40  Subjective: Patient seen and examined at the bedside today.  Hemodynamically stable.  Medically stable for discharge today.  No new complaints  Discharge Exam: Vitals:   04/24/24 0412 04/24/24 0726  BP: 116/72 118/66  Pulse: 90 86  Resp: 16 18  Temp: 98.3 F (36.8 C) 98.2 F (36.8 C)  SpO2: 99% 98%   Vitals:   04/23/24 1745 04/23/24 1944 04/24/24 0412 04/24/24 0726  BP: 124/64 126/67 116/72 118/66  Pulse: 60 94 90 86  Resp: 18 16 16 18   Temp: 98 F (36.7 C) 97.8 F (36.6 C) 98.3 F (36.8 C) 98.2 F (36.8 C)  TempSrc: Oral     SpO2: 100% 98% 99% 98%  Weight:      Height:        General: Pt is alert, awake, not in acute distress Cardiovascular: RRR, S1/S2 +, no rubs, no gallops Respiratory: CTA bilaterally, no wheezing, no rhonchi Abdominal:  Soft, NT, ND, bowel sounds + Extremities: no edema, no cyanosis,dressing on right little finger    The results of significant diagnostics from this hospitalization (including imaging, microbiology, ancillary and laboratory) are listed below for reference.     Microbiology: No results found for this or any previous visit (from the past 240 hours).   Labs: BNP (last 3 results) No results for input(s): "BNP" in the last 8760 hours. Basic Metabolic Panel: Recent Labs  Lab 04/20/24 0659 04/23/24 0550  NA 135 136  K 3.9 4.3  CL 104 104  CO2 24 22  GLUCOSE 98 97  BUN 7 10  CREATININE 0.74 0.72  CALCIUM  9.3 9.7   Liver Function Tests: No results for input(s): "AST", "ALT", "ALKPHOS", "BILITOT", "PROT", "ALBUMIN " in the last 168 hours. No results for input(s): "LIPASE", "AMYLASE" in the last 168 hours. No results for input(s): "AMMONIA" in the last 168 hours. CBC: Recent Labs  Lab 04/20/24 1039  WBC 6.4  NEUTROABS 2.7  HGB 11.4*  HCT 34.0*  MCV 97.1  PLT 332   Cardiac Enzymes: No results for input(s): "CKTOTAL", "CKMB", "CKMBINDEX", "TROPONINI" in the last 168 hours. BNP: Invalid input(s): "POCBNP" CBG: No results for input(s): "GLUCAP" in the last 168 hours. D-Dimer No results for input(s): "DDIMER" in the last 72 hours. Hgb A1c No results for input(s): "HGBA1C" in the last 72 hours. Lipid Profile No results for input(s): "CHOL", "HDL", "LDLCALC", "TRIG", "CHOLHDL", "LDLDIRECT" in the last 72 hours. Thyroid  function studies No results for input(s): "TSH", "T4TOTAL", "T3FREE", "THYROIDAB" in the last 72 hours.  Invalid input(s): "FREET3" Anemia work up No results for input(s): "VITAMINB12", "FOLATE", "FERRITIN", "TIBC", "IRON", "RETICCTPCT" in the last 72 hours. Urinalysis    Component Value Date/Time   COLORURINE YELLOW 04/17/2021 1556   APPEARANCEUR CLEAR 04/17/2021 1556   LABSPEC 1.023 04/17/2021 1556   PHURINE 8.0 04/17/2021 1556   GLUCOSEU NEGATIVE  04/17/2021 1556   HGBUR NEGATIVE 04/17/2021 1556   BILIRUBINUR NEGATIVE 04/17/2021 1556   KETONESUR NEGATIVE 04/17/2021 1556   PROTEINUR TRACE (A) 04/17/2021 1556   NITRITE NEGATIVE 04/17/2021 1556   LEUKOCYTESUR NEGATIVE 04/17/2021 1556   Sepsis Labs Recent Labs  Lab 04/20/24 1039  WBC 6.4   Microbiology No results found for this or any previous visit (from the past 240 hours).  Please note: You were cared for by a hospitalist during your hospital stay. Once you are discharged, your primary care physician will handle any further medical issues. Please note that NO REFILLS for any discharge medications will be authorized once you are  discharged, as it is imperative that you return to your primary care physician (or establish a relationship with a primary care physician if you do not have one) for your post hospital discharge needs so that they can reassess your need for medications and monitor your lab values.    Time coordinating discharge: 40 minutes  SIGNED:   Leona Rake, MD  [facility] 04/24/2024, 10:32 AM Pager [phone]  If 7PM-7AM, please contact night-coverage [email or web address] [facility] Physician Discharge Summary  Mark Galloway BJY:[id number] DOB: [birth year] DOA: 04/05/2024  PCP: Bubba Carbo, [name]  Admit date: 04/05/2024 Discharge date: 04/24/2024  Admitted From: Home Disposition:  Home  Discharge Condition:Stable CODE STATUS:FULL, DNR, Comfort Care Diet recommendation: Heart Healthy / Carb Modified / Regular / Dysphagia   Brief/Interim Summary:   Following problems were addressed during the hospitalization:   Discharge Diagnoses:  Principal Problem:   Cellulitis of hand Active Problems:   Osteomyelitis of right hand ([name])   AIDS ([name])   Paranoid schizophrenia ([name])   Hyponatremia   Bipolar 1 disorder ([name])   Cellulitis   Macrocytic anemia   Nonadherence to medical treatment    Discharge Instructions  Discharge Instructions      Ambulatory referral to Occupational Therapy   Complete by: As directed    Ambulatory referral to Occupational Therapy   Complete by: As directed    Diet - low sodium heart healthy   Complete by: As directed    Discharge instructions   Complete by: As directed    1)Please take prescribed medications as instructed 2)Follow up with ID and orthopedics as an outpatient.   Increase activity slowly   Complete by: As directed    No wound care   Complete by: As directed       Allergies as of 04/24/2024       Reactions   Peanut Oil Hives   Peanut-containing Drug Products Hives        Medication List     STOP taking these medications    hydrALAZINE  50 MG tablet Commonly known as: APRESOLINE    melatonin 5 MG Tabs   propranolol  10 MG tablet Commonly known as: INDERAL        TAKE these medications    Biktarvy  50-200-25 MG Tabs tablet Generic drug: bictegravir-emtricitabine -tenofovir  AF Take 1 tablet by mouth daily.   ferrous sulfate  325 (65 FE) MG tablet Take 1 tablet (325 mg total) by mouth daily with lunch.   linezolid  600 MG tablet Commonly known as: ZYVOX  Take 1 tablet (600 mg total) by mouth 2 (two) times daily for 14 days.   nicotine  21 mg/24hr patch Commonly known as: NICODERM CQ  - dosed in mg/24 hours Place 1 patch (21 mg total) onto the skin daily.   OLANZapine  zydis 15 MG disintegrating tablet Commonly known as: ZYPREXA  Take 1 tablet (15 mg total) by mouth 2 (two) times daily.   sulfamethoxazole -trimethoprim  800-160 MG tablet Commonly known as: Bactrim  DS Take 1 tablet by mouth daily. What changed: when to take this        Follow-up Information     [facility] Follow up.   Why: Please call and schedule a intake or speak with them regarding assistance for HIV medications Contact information: [address] [place] [address] [phone]         [facility]. Schedule an appointment as soon as  possible for a visit.   Specialty: Rehabilitation Why: Call to schedule apt for occupational thearpy Contact information: [address] [employer] [address] [place]  [address] [phone]               Allergies  Allergen Reactions   Peanut Oil Hives   Peanut-Containing Drug Products Hives    Consultations:    Procedures/Studies: US  RT UPPER EXTREM LTD SOFT TISSUE NON VASCULAR Result Date: 04/10/2024 CLINICAL DATA:  Finger abscess EXAM: ULTRASOUND right UPPER EXTREMITY LIMITED TECHNIQUE: Ultrasound examination of the upper extremity soft tissues was performed in the area of clinical concern. COMPARISON:  None Available. FINDINGS: Targeted sonography of the right fifth digit is performed in the region clinical concern. Soft tissue thickening with hyperemia. No discrete focal fluid collection by ultrasound. IMPRESSION: Soft tissue thickening/edema with hyperemia in the right fifth digit. No discrete focal fluid collection by ultrasound. Electronically Signed   By: Esmeralda Hedge M.D.   On: 04/10/2024 03:48   DG Foot 2 Views Left Result Date: 04/06/2024 CLINICAL DATA:  92319.  Cellulitis. EXAM: LEFT FOOT - 2 VIEW COMPARISON:  Study of 03/21/2024 FINDINGS: Mild edema in the soft tissues. This is improved from the prior study. No soft tissue gas is seen or radiopaque foreign body. The underlying bones are unremarkable. There is normal bone mineralization with no evidence of fracture, dislocation or focal pathologic process. IMPRESSION: Mild edema in the soft tissues, improved from the prior study. No soft tissue gas or radiopaque foreign body. No acute osseous abnormality. Electronically Signed   By: Denman Fischer M.D.   On: 04/06/2024 07:54   DG Chest Port 1 View Result Date: 04/06/2024 CLINICAL DATA:  141880 SOB (shortness of breath) 141880 EXAM: PORTABLE CHEST 1 VIEW COMPARISON:  Chest x-ray 04/13/2023 FINDINGS: The heart and mediastinal contours are unchanged. No focal consolidation.  No pulmonary edema. No pleural effusion. No pneumothorax. No acute osseous abnormality. IMPRESSION: No active disease. Electronically Signed   By: Morgane  Naveau M.D.   On: 04/06/2024 03:13   DG Finger Little Right Result Date: 04/05/2024 CLINICAL DATA:  Nonhealing wound on anterior right little finger. History is spider bite. EXAM: RIGHT LITTLE FINGER 2+V COMPARISON:  03/17/2024. FINDINGS: Periosteal elevation is noted along the mid and proximal phalanx of the fifth digit with a associated bony erosions. Lucency is noted in the head of the middle phalanx on image 2, possible nondisplaced fracture. There is diffuse soft tissue swelling of the fifth digit. No radiopaque foreign body is seen. IMPRESSION: Periosteal elevation involving in the proximal and mid phalanx of the fifth digit, concerning for osteomyelitis. There is a linear lucency in the head of the middle phalanx on image 2, possible nondisplaced fracture. Electronically Signed   By: Wyvonnia Heimlich M.D.   On: 04/05/2024 18:40      Subjective:   Discharge Exam: Vitals:   04/24/24 0412 04/24/24 0726  BP: 116/72 118/66  Pulse: 90 86  Resp: 16 18  Temp: 98.3 F (36.8 C) 98.2 F (36.8 C)  SpO2: 99% 98%   Vitals:   04/23/24 1745 04/23/24 1944 04/24/24 0412 04/24/24 0726  BP: 124/64 126/67 116/72 118/66  Pulse: 60 94 90 86  Resp: 18 16 16 18   Temp: 98 F (36.7 C) 97.8 F (36.6 C) 98.3 F (36.8 C) 98.2 F (36.8 C)  TempSrc: Oral     SpO2: 100% 98% 99% 98%  Weight:      Height:        General: Pt is alert, awake, not in acute distress Cardiovascular: RRR, S1/S2 +, no rubs, no gallops Respiratory: CTA bilaterally,  no wheezing, no rhonchi Abdominal: Soft, NT, ND, bowel sounds + Extremities: no edema, no cyanosis    The results of significant diagnostics from this hospitalization (including imaging, microbiology, ancillary and laboratory) are listed below for reference.     Microbiology: No results found for this or any  previous visit (from the past 240 hours).   Labs: BNP (last 3 results) No results for input(s): "BNP" in the last 8760 hours. Basic Metabolic Panel: Recent Labs  Lab 04/20/24 0659 04/23/24 0550  NA 135 136  K 3.9 4.3  CL 104 104  CO2 24 22  GLUCOSE 98 97  BUN 7 10  CREATININE 0.74 0.72  CALCIUM  9.3 9.7   Liver Function Tests: No results for input(s): "AST", "ALT", "ALKPHOS", "BILITOT", "PROT", "ALBUMIN " in the last 168 hours. No results for input(s): "LIPASE", "AMYLASE" in the last 168 hours. No results for input(s): "AMMONIA" in the last 168 hours. CBC: Recent Labs  Lab 04/20/24 1039  WBC 6.4  NEUTROABS 2.7  HGB 11.4*  HCT 34.0*  MCV 97.1  PLT 332   Cardiac Enzymes: No results for input(s): "CKTOTAL", "CKMB", "CKMBINDEX", "TROPONINI" in the last 168 hours. BNP: Invalid input(s): "POCBNP" CBG: No results for input(s): "GLUCAP" in the last 168 hours. D-Dimer No results for input(s): "DDIMER" in the last 72 hours. Hgb A1c No results for input(s): "HGBA1C" in the last 72 hours. Lipid Profile No results for input(s): "CHOL", "HDL", "LDLCALC", "TRIG", "CHOLHDL", "LDLDIRECT" in the last 72 hours. Thyroid  function studies No results for input(s): "TSH", "T4TOTAL", "T3FREE", "THYROIDAB" in the last 72 hours.  Invalid input(s): "FREET3" Anemia work up No results for input(s): "VITAMINB12", "FOLATE", "FERRITIN", "TIBC", "IRON", "RETICCTPCT" in the last 72 hours. Urinalysis    Component Value Date/Time   COLORURINE YELLOW 04/17/2021 1556   APPEARANCEUR CLEAR 04/17/2021 1556   LABSPEC 1.023 04/17/2021 1556   PHURINE 8.0 04/17/2021 1556   GLUCOSEU NEGATIVE 04/17/2021 1556   HGBUR NEGATIVE 04/17/2021 1556   BILIRUBINUR NEGATIVE 04/17/2021 1556   KETONESUR NEGATIVE 04/17/2021 1556   PROTEINUR TRACE (A) 04/17/2021 1556   NITRITE NEGATIVE 04/17/2021 1556   LEUKOCYTESUR NEGATIVE 04/17/2021 1556   Sepsis Labs Recent Labs  Lab 04/20/24 1039  WBC 6.4    Microbiology No results found for this or any previous visit (from the past 240 hours).  Please note: You were cared for by a hospitalist during your hospital stay. Once you are discharged, your primary care physician will handle any further medical issues. Please note that NO REFILLS for any discharge medications will be authorized once you are discharged, as it is imperative that you return to your primary care physician (or establish a relationship with a primary care physician if you do not have one) for your post hospital discharge needs so that they can reassess your need for medications and monitor your lab values.    Time coordinating discharge: 40 minutes  SIGNED:   Leona Rake, MD  [facility] 04/24/2024, 10:32 AM Pager [phone]  If 7PM-7AM, please contact night-coverage [email or web address] Password [email or web address]

## 2024-04-24 NOTE — Plan of Care (Signed)

## 2024-04-24 NOTE — TOC Transition Note (Signed)
 Transition of Care [facility]) - Discharge Note   Patient Details  Name: Mark Galloway MRN: [id number] Date of Birth: 10/17/1986  [facility] CM/SW Contact:  Tom-Johnson, Angelique Ken, RN Phone Number: 04/24/2024, 11:21 AM   Clinical Narrative:     Patient is scheduled for discharge today.  Readmission Risk Assessment done. Outpatient PT/OT info, Outpatient referral, Hospital f/u and discharge instructions on AVS. Prescriptions sent to [facility] pharmacy and patient will receive meds prior discharge. Mother, Gilberto Labella to transport at discharge.  No further TOC needs noted.       Final next level of care: Homeless Shelter Barriers to Discharge: Barriers Resolved   Patient Goals and CMS Choice Patient states their goals for this hospitalization and ongoing recovery are:: discharge to substance use residential treatment facility          Discharge Placement                Patient to be transferred to facility by: Mother Name of family member notified: Talyah    Discharge Plan and Services Additional resources added to the After Visit Summary for   In-house Referral: Clinical Social Work   Post Acute Care Choice:  (substance use residential treatment facility)          DME Arranged: N/A DME Agency: NA       HH Arranged: NA HH Agency: NA        Social Drivers of Health (SDOH) Interventions SDOH Screenings   Food Insecurity: Food Insecurity Present (04/06/2024)  Housing: High Risk (04/06/2024)  Transportation Needs: Unmet Transportation Needs (04/06/2024)  Utilities: At Risk (04/06/2024)  Alcohol Screen: Low Risk  (03/14/2024)  Depression (PHQ2-9): Low Risk  (03/08/2023)  Financial Resource Strain: High Risk (01/04/2024)   [facility] from [facility]  Social Connections: Unknown (05/04/2022)   [facility]  Tobacco Use: High Risk (04/09/2024)     Readmission Risk Interventions    04/24/2024   11:18 AM  Readmission Risk  Prevention Plan  Transportation Screening Complete  PCP or Specialist Appt within 3-5 Days Complete  HRI or Home Care Consult Complete  Social Work Consult for Recovery Care Planning/Counseling Complete  Palliative Care Screening Not Applicable  Medication Review Oceanographer) Referral to Pharmacy

## 2024-05-07 ENCOUNTER — Telehealth: Payer: Self-pay

## 2024-05-07 ENCOUNTER — Ambulatory Visit: Payer: Self-pay | Admitting: Infectious Diseases

## 2024-05-07 NOTE — Telephone Encounter (Signed)
 Patient considered out of care.  Last [facility] Visit: 03/08/23  Last HIV Viral Load:  HIV 1 RNA Quant  Date Value Ref Range Status  03/15/2024 80 copies/mL Corrected    Comment:    (NOTE) The reportable range for this assay is 20 to 10,000,000 copies HIV-1 RNA/mL.     Last CD4 Count:  CD4 T Cell Abs  Date Value Ref Range Status  11/07/2022 54 (L) 400 - 1,790 /uL Final    Medication Dispense History:   Dispensed Days Supply Quantity Provider Pharmacy  bictegravir-emtricitabine -tenofovir  AF (BIKTARVY ) 50-200-25 MG TABS tablet 04/21/2024 30 30 tablet Liane Redman, MD [facility]...  BIKTARVY  50-200-25 MG TABLET 03/31/2024 30 30 each Jadapalle, Sree, MD [facility] [phone] - [facility]...  bictegravir-emtricitabine -tenofovir  AF (BIKTARVY ) 50-200-25 MG TABS tablet 02/24/2024 30 30 tablet Pokhrel, Laxman, MD [place] [facility]...    Interventions: Called home number, Corliss's mom answered and state's he's not available right now. Asked if she could have him give us  a call back. Did not discuss any personal or health information. Called patient's cell, number is no longer in service.   Duration of Services: 10 minutes  Fadel Clason, BSN, Charity fundraiser

## 2024-06-03 ENCOUNTER — Telehealth: Payer: Self-pay

## 2024-06-03 NOTE — Telephone Encounter (Signed)
 Called [facility] to schedule appointment, no answer and unable to leave message. Called second number, call could not be completed.   Maili Shutters, BSN, RN

## 2024-07-09 ENCOUNTER — Other Ambulatory Visit: Payer: Self-pay

## 2024-07-09 ENCOUNTER — Ambulatory Visit: Admitting: Family

## 2024-07-09 ENCOUNTER — Encounter: Payer: Self-pay | Admitting: Family

## 2024-07-09 VITALS — BP 121/76 | HR 90 | Temp 98.1°F | Ht 69.0 in | Wt 170.0 lb

## 2024-07-09 DIAGNOSIS — Z609 Problem related to social environment, unspecified: Secondary | ICD-10-CM | POA: Diagnosis not present

## 2024-07-09 DIAGNOSIS — B20 Human immunodeficiency virus [HIV] disease: Secondary | ICD-10-CM | POA: Diagnosis not present

## 2024-07-09 DIAGNOSIS — Z0000 Encounter for general adult medical examination without abnormal findings: Secondary | ICD-10-CM

## 2024-07-09 DIAGNOSIS — F200 Paranoid schizophrenia: Secondary | ICD-10-CM | POA: Diagnosis not present

## 2024-07-09 DIAGNOSIS — M86141 Other acute osteomyelitis, right hand: Secondary | ICD-10-CM | POA: Diagnosis not present

## 2024-07-09 DIAGNOSIS — M869 Osteomyelitis, unspecified: Secondary | ICD-10-CM

## 2024-07-09 MED ORDER — BIKTARVY 50-200-25 MG PO TABS: 1.0000 | ORAL_TABLET | Freq: Every day | ORAL | 4 refills | Status: AC

## 2024-07-09 MED ORDER — OLANZAPINE 15 MG PO TABS: 15.0000 mg | ORAL_TABLET | Freq: Two times a day (BID) | ORAL | 2 refills | Status: AC

## 2024-07-09 NOTE — Assessment & Plan Note (Signed)
 Mark Galloway has well controlled virus since leaving the hospital and continues to take his Biktarvy  and Bactrim  as prescribed with no adverse side effects. Reviewed previous lab work and discussed plan of care. Covered by Medicare and Medicaid and should have no problems obtaining medication from the hospital. Social determinants of health reviewed have referred to appropriate resources. Check lab work. If CD4 count is >150 and viral load remains undetectable will stop Bactrim . Continue current dose of Biktarvy . Plan for follow up in 1 month or sooner if needed with lab work on the same day.

## 2024-07-09 NOTE — Assessment & Plan Note (Signed)
 Currently on olanzapine  and not taking on regular basis. Discussed importance of taking medications as prescribed. Follow up per psychiatry.

## 2024-07-09 NOTE — Assessment & Plan Note (Signed)
 Discussed importance of safe sexual practice and condom use. Condoms and site specific STD testing offered.  Vaccinations reviewed and deferred today. Due for routine dental care and will connect with [facility] if needed.  Due for anal cancer screening and deferred today following counseling.

## 2024-07-09 NOTE — Assessment & Plan Note (Signed)
 Housing, mental health and transportation remain an issue. Have sent assistance for housing to [facility] and Case Management assistance to [facility]. Bus passes provided.

## 2024-07-09 NOTE — Assessment & Plan Note (Signed)
 Hand has healed well with surgical incision closed. Right fifth finger contracted and recommend follow up with Dr. Murrell. Information provided in AVS.

## 2024-07-09 NOTE — Patient Instructions (Addendum)
 Nice to see you.  We will check your lab work today.  Continue to take your medication daily as prescribed.  Refills have been sent to the pharmacy.  Plan for follow up in 1 months or sooner if needed with lab work on the same day.  Have a great day and stay safe!  Dr. Kuzma [address], [place], [place] [address] Phone: [phone]

## 2024-07-09 NOTE — Progress Notes (Signed)
 Brief Narrative   Patient ID: Mark Galloway, male    DOB: June 17, 1986, 38 y.o.   MRN: [id number]  Mark Galloway is a 38 y/o AA male diagnosed with HIV-1 disease in November 2019 with risk factor of exposure to body fluid and possible needle stick. Initial viral load was 21,500 with CD4 count of 122. Genosure with no significant medication resistant mutations.Thrush with no other opportunistic infection. [id number] negative. Started on Biktarvy .    Subjective:   Chief Complaint  Patient presents with   Hospitalization Follow-up    B20 Patients feeling well has no concerns.    HPI:  Mark Galloway is a 38 y.o. male with HIV disease last seen on 04/21/2024 during hospitalization by Corean Fireman, NP and Dr. Luiz with cellulitis/osteomyelitis of the hand with MRSA and ESBL Klebsiella with plan to complete ertapenem  and start linezolid .  Was restarted on Biktarvy  and Bactrim  for OI prophylaxis.  Last viral load prior to leaving the hospital was 80 with CD4 count 164.  Did his miss his posthospital follow-up and is here today for routine follow-up.  Mark Galloway has been doing okay since leaving the hospital and continues to take Biktarvy  for ART and Bactrim  for OI prophylaxis as prescribed with no adverse side effects or problems getting medication from the pharmacy.  Covered by Medicare and Medicaid.  His hand has healed adequately with no further drainage, however his right fifth finger is contracted.  Has not followed up with the surgeon.  Currently living in a hotel and may have found a house but still needs assistance with housing and transportation.  Currently using the bus system.  Requesting assistance with case management.  Not currently sexually active.  Healthcare maintenance reviewed.    Denies fevers, chills, night sweats, headaches, changes in vision, neck pain/stiffness, nausea, diarrhea, vomiting, lesions or rashes.  Lab Results  Component Value Date   CD4TCELL 10 (L) 04/22/2023    CD4TABS 54 (L) 11/07/2022   Lab Results  Component Value Date   HIV1RNAQUANT 80 03/15/2024     Allergies  Allergen Reactions   Peanut Oil Hives   Peanut-Containing Drug Products Hives      Outpatient Medications Prior to Visit  Medication Sig Dispense Refill   ferrous sulfate  325 (65 FE) MG tablet Take 1 tablet (325 mg total) by mouth daily with lunch. 60 tablet 0   nicotine  (NICODERM CQ  - DOSED IN MG/24 HOURS) 21 mg/24hr patch Place 1 patch (21 mg total) onto the skin daily. 28 patch 0   bictegravir-emtricitabine -tenofovir  AF (BIKTARVY ) 50-200-25 MG TABS tablet Take 1 tablet by mouth daily. 30 tablet 0   OLANZapine  (ZYPREXA ) 15 MG tablet Take 1 tablet (15 mg total) by mouth 2 (two) times daily. 60 tablet 0   No facility-administered medications prior to visit.     Past Medical History:  Diagnosis Date   Bipolar 1 disorder ([name])    HIV infection ([name])    Schizophrenia ([name])    Shingles      Past Surgical History:  Procedure Laterality Date   I & D EXTREMITY Right 02/22/2024   Procedure: IRRIGATION AND DEBRIDEMENT EXTREMITY;  Surgeon: Murrell Drivers, MD;  Location: [facility] OR;  Service: Orthopedics;  Laterality: Right;   IRRIGATION AND DEBRIDEMENT FOOT Left         Review of Systems  Constitutional:  Negative for chills, diaphoresis, fatigue and fever.  Respiratory:  Negative for cough, chest tightness, shortness of breath and wheezing.   Cardiovascular:  Negative  for chest pain.  Gastrointestinal:  Negative for abdominal pain, diarrhea, nausea and vomiting.     Objective:   BP 121/76   Pulse 90   Temp 98.1 F (36.7 C) (Oral)   Ht 5' 9 (1.753 m)   Wt 170 lb (77.1 kg)   SpO2 98%   BMI 25.10 kg/m  Nursing note and vital signs reviewed.  Physical Exam Constitutional:      General: He is not in acute distress.    Appearance: He is well-developed.  Eyes:     Conjunctiva/sclera: Conjunctivae normal.  Cardiovascular:     Rate and Rhythm: Normal rate and  regular rhythm.     Heart sounds: Normal heart sounds. No murmur heard.    No friction rub. No gallop.  Pulmonary:     Effort: Pulmonary effort is normal. No respiratory distress.     Breath sounds: Normal breath sounds. No wheezing or rales.  Chest:     Chest wall: No tenderness.  Abdominal:     General: Bowel sounds are normal.     Palpations: Abdomen is soft.     Tenderness: There is no abdominal tenderness.  Musculoskeletal:     Cervical back: Neck supple.  Lymphadenopathy:     Cervical: No cervical adenopathy.  Skin:    General: Skin is warm and dry.     Findings: No rash.  Neurological:     Mental Status: He is alert and oriented to person, place, and time.  Psychiatric:        Mood and Affect: Mood normal.          07/09/2024   11:32 AM 03/08/2023   10:52 AM 11/07/2022    2:03 PM  Depression screen PHQ 2/9  Decreased Interest 0 0 0  Down, Depressed, Hopeless 0 0 0  PHQ - 2 Score 0 0 0         No data to display           The ASCVD Risk score (Arnett DK, et al., 2019) failed to calculate for the following reasons:   The 2019 ASCVD risk score is only valid for ages 60 to 40      Assessment & Plan:    Patient Active Problem List   Diagnosis Date Noted   Poor social situation 07/09/2024   Nonadherence to medical treatment 04/06/2024   Cellulitis of hand 04/05/2024   Macrocytic anemia 04/05/2024   MRSA infection 03/23/2024   Tenosynovitis of finger 03/23/2024   Osteomyelitis of right hand ([name]) 03/21/2024   Cellulitis of foot, left 03/21/2024   HTN (hypertension) 03/21/2024   Wound infection after surgery 03/17/2024   Hyponatremia 03/17/2024   Paranoid schizophrenia ([name]) 03/14/2024   Cellulitis of right hand 02/22/2024   Cellulitis 02/22/2024   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 Right fifth finger contracted and recommend follow up with Dr. Murrell. Information provided in AVS.       Relevant Medications   bictegravir-emtricitabine -tenofovir  AF (BIKTARVY ) 50-200-25 MG TABS tablet     Other   AIDS ([name]) - Primary   Mark Galloway  has well controlled virus since leaving the hospital and continues to take his Biktarvy  and Bactrim  as prescribed with no adverse side effects. Reviewed previous lab work and discussed plan of care. Covered by Medicare and Medicaid and should have no problems obtaining medication from the hospital. Social determinants of health reviewed have referred to appropriate resources. Check lab work. If CD4 count is >150 and viral load remains undetectable will stop Bactrim . Continue current dose of Biktarvy . Plan for follow up in 1 month or sooner if needed with lab work on the same day.       Relevant Medications   bictegravir-emtricitabine -tenofovir  AF (BIKTARVY ) 50-200-25 MG TABS tablet   Other Relevant Orders   Comprehensive metabolic panel with GFR   HIV-1 RNA quant-no reflex-bld   T-helper cell (CD4)- ([facility] clinic only)   AMB REFERRAL TO COMMUNITY SERVICE AGENCY   AMB REFERRAL TO COMMUNITY SERVICE AGENCY   Healthcare maintenance   Discussed importance of safe sexual practice and condom use. Condoms and site specific STD testing offered.  Vaccinations reviewed and deferred today. Due for routine dental care and will connect with [facility] if needed.  Due for anal cancer screening and deferred today following counseling.       Paranoid schizophrenia ([name])   Currently on olanzapine  and not taking on regular basis. Discussed importance of taking medications as prescribed. Follow up per psychiatry.       Poor social situation   Housing, mental health and  transportation remain an issue. Have sent assistance for housing to [facility] and Case Management assistance to [facility]. Bus passes provided.       Relevant Orders   AMB REFERRAL TO COMMUNITY SERVICE AGENCY   AMB REFERRAL TO COMMUNITY SERVICE AGENCY     I am having Mark Galloway maintain his nicotine , ferrous sulfate , Biktarvy , and OLANZapine .   Meds ordered this encounter  Medications   bictegravir-emtricitabine -tenofovir  AF (BIKTARVY ) 50-200-25 MG TABS tablet    Sig: Take 1 tablet by mouth daily.    Dispense:  30 tablet    Refill:  4    Supervising Provider:   SNIDER, CYNTHIA [[id number]]   OLANZapine  (ZYPREXA ) 15 MG tablet    Sig: Take 1 tablet (15 mg total) by mouth 2 (two) times daily.    Dispense:  60 tablet    Refill:  2    Not ODT per Dr. Jillian- 04/24/24, cht    Supervising Provider:   LUIZ CHANNEL [phone]     Follow-up: Return in about 1 month (around 08/09/2024). or sooner if needed.    Cathlyn July, MSN, [name]-C Nurse Practitioner [facility] for [facility]ious Disease [facility] Main number: [phone]

## 2024-07-10 LAB — T-HELPER CELL (CD4) - (RCID CLINIC ONLY)
CD4 % Helper T Cell: 16 % — ABNORMAL LOW (ref 33–65)
CD4 T Cell Abs: 218 /uL — ABNORMAL LOW (ref 400–1790)

## 2024-07-11 LAB — COMPREHENSIVE METABOLIC PANEL WITH GFR
AG Ratio: 1.2 (calc) (ref 1.0–2.5)
ALT: 11 U/L (ref 9–46)
AST: 18 U/L (ref 10–40)
Albumin: 4.5 g/dL (ref 3.6–5.1)
Alkaline phosphatase (APISO): 105 U/L (ref 36–130)
BUN: 8 mg/dL (ref 7–25)
CO2: 27 mmol/L (ref 20–32)
Calcium: 9.6 mg/dL (ref 8.6–10.3)
Chloride: 103 mmol/L (ref 98–110)
Creat: 0.87 mg/dL (ref 0.60–1.26)
Globulin: 3.7 g/dL (ref 1.9–3.7)
Glucose, Bld: 95 mg/dL (ref 65–99)
Potassium: 3.8 mmol/L (ref 3.5–5.3)
Sodium: 137 mmol/L (ref 135–146)
Total Bilirubin: 0.2 mg/dL (ref 0.2–1.2)
Total Protein: 8.2 g/dL — ABNORMAL HIGH (ref 6.1–8.1)
eGFR: 114 mL/min/1.73m2 (ref >=60)

## 2024-07-11 LAB — HIV-1 RNA QUANT-NO REFLEX-BLD
HIV 1 RNA Quant: 214 {copies}/mL — ABNORMAL HIGH
HIV-1 RNA Quant, Log: 2.33 {Log_copies}/mL — ABNORMAL HIGH

## 2024-07-17 ENCOUNTER — Encounter: Payer: Self-pay | Admitting: Advanced Practice Midwife

## 2024-08-10 ENCOUNTER — Ambulatory Visit: Admitting: Family

## 2024-08-17 ENCOUNTER — Telehealth: Payer: Self-pay

## 2024-08-17 DIAGNOSIS — B20 Human immunodeficiency virus [HIV] disease: Secondary | ICD-10-CM

## 2024-08-17 NOTE — Telephone Encounter (Signed)
 Detectable Viral Load Intervention (DVL)  Most recent VL:  HIV 1 RNA Quant  Date Value Ref Range Status  07/09/2024 214 (H) NOT DETECTED copies/mL Final  03/15/2024 80 copies/mL Corrected    Comment:    (NOTE) The reportable range for this assay is 20 to 10,000,000 copies HIV-1 RNA/mL.   04/22/2023 93 (H) Copies/mL Final    Last Clinic Visit: 07/09/24  Current ART regimen: Biktarvy   Appointment status: patient does not have future appointment scheduled   Medication last dispensed (per chart review):   Medication Adherence   Not able to assess    Barriers to Care   Not able to assess   Interventions   Called patient to discuss medication adherence and possible barriers to care. Not abl to reach at this time. Left voicemail requesting call back. Lorenda CHRISTELLA Code, RMA

## 2024-08-19 ENCOUNTER — Ambulatory Visit: Attending: Internal Medicine | Admitting: Occupational Therapy

## 2024-08-26 NOTE — Addendum Note (Signed)
 Addended by: Asaad Gulley M on: 08/26/2024 10:11 AM   Modules accepted: Orders

## 2024-08-26 NOTE — Telephone Encounter (Signed)
 Spoke with pt and rescheduled miss appt. Denies missing doses of ART. Barriers to care is transportation. Okay with referral to [facility].  Lorenda CHRISTELLA Code, RMA

## 2024-09-01 ENCOUNTER — Other Ambulatory Visit: Payer: Self-pay

## 2024-09-01 ENCOUNTER — Encounter (HOSPITAL_COMMUNITY): Payer: Self-pay | Admitting: Emergency Medicine

## 2024-09-01 ENCOUNTER — Emergency Department (HOSPITAL_COMMUNITY): Admission: EM | Admit: 2024-09-01 | Discharge: 2024-09-01 | Disposition: A | Discharge status: Home / Self Care

## 2024-09-01 DIAGNOSIS — S60445A External constriction of left ring finger, initial encounter: Secondary | ICD-10-CM | POA: Insufficient documentation

## 2024-09-01 DIAGNOSIS — Z91010 Allergy to peanuts: Secondary | ICD-10-CM | POA: Insufficient documentation

## 2024-09-01 DIAGNOSIS — S60449A External constriction of unspecified finger, initial encounter: Secondary | ICD-10-CM

## 2024-09-01 DIAGNOSIS — W4904XA Ring or other jewelry causing external constriction, initial encounter: Secondary | ICD-10-CM | POA: Insufficient documentation

## 2024-09-01 NOTE — ED Triage Notes (Signed)
 Patient c/o  ring stuck on left little finger x 1 week.

## 2024-09-01 NOTE — ED Notes (Signed)
 Patient left after PA is trying to cut his ring. Patient ignored PA and left the department.

## 2024-09-01 NOTE — ED Provider Notes (Signed)
 [place] EMERGENCY DEPARTMENT AT [facility] Provider Note   CSN: [id number] Arrival date & time: 09/01/24  1931     Patient presents with: Ring stuck on Little finger   Mark Galloway is a 38 y.o. male.  HPI Patient is a 38 year old male who is in the ED today for concerns for ring stuck on little finger x 1 week.  Noted that it has now become swollen and increasingly painful.  Endorses tingling sensations as well as pain, still has full range of motion and sensation.    Prior to Admission medications   Medication Sig Start Date End Date Taking? Authorizing Provider  bictegravir-emtricitabine -tenofovir  AF (BIKTARVY ) 50-200-25 MG TABS tablet Take 1 tablet by mouth daily. 07/09/24   Calone, Gregory D, [name]  ferrous sulfate  325 (65 FE) MG tablet Take 1 tablet (325 mg total) by mouth daily with lunch. 04/24/24   Jillian Buttery, MD  nicotine  (NICODERM CQ  - DOSED IN MG/24 HOURS) 21 mg/24hr patch Place 1 patch (21 mg total) onto the skin daily. 04/01/24   Donnelly Mellow, MD  OLANZapine  (ZYPREXA ) 15 MG tablet Take 1 tablet (15 mg total) by mouth 2 (two) times daily. 07/09/24   Calone, Gregory D, [name]    Allergies: Peanut oil and Peanut-containing drug products    Review of Systems  Musculoskeletal:  Positive for joint swelling.  All other systems reviewed and are negative.   Updated Vital Signs BP 116/69   Pulse 95   Temp 98 F (36.7 C)   Resp 16   SpO2 100%   Physical Exam Vitals and nursing note reviewed.  Constitutional:      General: He is not in acute distress.    Appearance: Normal appearance. He is not ill-appearing or diaphoretic.  HENT:     Head: Normocephalic and atraumatic.  Eyes:     General: No scleral icterus.       Right eye: No discharge.        Left eye: No discharge.     Extraocular Movements: Extraocular movements intact.     Conjunctiva/sclera: Conjunctivae normal.  Cardiovascular:     Rate and Rhythm: Normal rate and regular rhythm.      Pulses: Normal pulses.     Heart sounds: Normal heart sounds. No murmur heard.    No friction rub. No gallop.  Pulmonary:     Effort: Pulmonary effort is normal. No respiratory distress.     Breath sounds: No stridor. No wheezing, rhonchi or rales.  Chest:     Chest wall: No tenderness.  Abdominal:     General: Abdomen is flat. There is no distension.     Palpations: Abdomen is soft.     Tenderness: There is no abdominal tenderness. There is no right CVA tenderness, left CVA tenderness, guarding or rebound.  Musculoskeletal:        General: Swelling and tenderness present. No deformity or signs of injury.     Cervical back: Normal range of motion. No rigidity.     Right lower leg: No edema.     Left lower leg: No edema.     Comments: Noted to have redness, swelling, tenderness distal to ring on left middle finger  Skin:    General: Skin is warm and dry.     Findings: Erythema present. No bruising or lesion.  Neurological:     General: No focal deficit present.     Mental Status: He is alert and oriented to person, place, and time. Mental  status is at baseline.     Sensory: No sensory deficit.     Motor: No weakness.  Psychiatric:        Mood and Affect: Mood normal.     (all labs ordered are listed, but only abnormal results are displayed) Labs Reviewed - No data to display  EKG: None  Radiology: No results found.  Procedures   Medications Ordered in the ED - No data to display                                Medical Decision Making  This patient is a 38 year old male who presents to the ED for concern of ring stuck on pinky finger x 1 week coming in today with swelling and need for removal..  On physical exam, patient is in no acute distress, afebrile, alert and orient x 4, speaking in full sentences, nontachypneic, nontachycardic.  Patient has notable swollen and erythematous left finger.  Ring still attached to left finger.  Spoke with patient that we will  initially try to remove the ring without pain medications but that we can use pain medications as needed.  Attempted to insert ring cutter underneath finger after applying lube, patient then quickly jerked away causing wound to left pinky finger, bleeding, saying I cannot do it like this immediately gathering his things and walking out of the room.  Spoke quickly with patient as he was gathering his things that we need to remove the ring and may lead to possible loss of finger if he is not get this removed now with the swelling and pain.  Follow patient into the hallway with him continuing to ignore.  He continued to say I cannot do this, I cannot do this not heading responses.  Told him repeatedly that we can use pain medications to help with assistance.  He continued not to listen and walked out the door.  Unable to evaluate him further.  Was not able to talk to him further as he just walked out.  Differential diagnoses prior to evaluation: The emergent differential diagnosis includes, but is not limited to, fracture, ligamentous injury, neurovascular injury, dislocation, malalignment, compartment syndrome. This is not an exhaustive differential.   Past Medical History / Co-morbidities / Social History: Bipolar disorder, shingles, schizophrenia, HIV.  Additional history: Chart reviewed. Pertinent results include:   Recently seen in hospital on 4/6 and 25, admitted for osteomyelitis of right little finger and cellulitis of right hand    Medications:   I have reviewed the patients home medicines and have made adjustments as needed.  Critical Interventions:  Social Determinants of Health:  Disposition: Unable to further evaluate or treat the patient as patient had walked out despite many attempts to try and talk him into staying, patient did not listen and walked out with notable bleeding to left pinky.   Final diagnoses:  Tight ring on finger    ED Discharge Orders     None           Beola Terrall RAMAN, PA-C 09/01/24 [address], [place] 09/02/24 [phone]

## 2024-09-03 ENCOUNTER — Encounter (HOSPITAL_COMMUNITY): Payer: Self-pay | Admitting: Emergency Medicine

## 2024-09-03 ENCOUNTER — Ambulatory Visit (HOSPITAL_COMMUNITY): Admission: EM | Admit: 2024-09-03 | Discharge: 2024-09-03 | Disposition: A | Discharge status: Home / Self Care

## 2024-09-03 ENCOUNTER — Emergency Department (HOSPITAL_COMMUNITY): Admission: EM | Admit: 2024-09-03 | Discharge: 2024-09-03 | Discharge status: Against Medical Advice

## 2024-09-03 DIAGNOSIS — W4904XA Ring or other jewelry causing external constriction, initial encounter: Secondary | ICD-10-CM | POA: Diagnosis not present

## 2024-09-03 DIAGNOSIS — S60457A Superficial foreign body of left little finger, initial encounter: Secondary | ICD-10-CM | POA: Diagnosis present

## 2024-09-03 DIAGNOSIS — S60449A External constriction of unspecified finger, initial encounter: Secondary | ICD-10-CM

## 2024-09-03 DIAGNOSIS — Z5329 Procedure and treatment not carried out because of patient's decision for other reasons: Secondary | ICD-10-CM | POA: Insufficient documentation

## 2024-09-03 DIAGNOSIS — Z91010 Allergy to peanuts: Secondary | ICD-10-CM | POA: Diagnosis not present

## 2024-09-03 NOTE — ED Notes (Signed)
 Patient is being discharged from the Urgent Care and sent to the Emergency Department via POV with friend. Per Krystal Lowing, DO, patient is in need of higher level of care due to ring stuck on swollen finger and not able to cut with out ring cutter. Patient is aware and verbalizes understanding of plan of care.  Vitals:   09/03/24 0837  BP: 116/76  Pulse: 80  Resp: 15  Temp: 98.4 F (36.9 C)  SpO2: 97%

## 2024-09-03 NOTE — ED Triage Notes (Signed)
 Pt sent down from UC to have a ring cut off his pinky finger on the left hand ,

## 2024-09-03 NOTE — ED Triage Notes (Signed)
 Pt has swollen left 5th finger causing a ring to become stuck on finger. Pt reports that he has pain when tries to pull ring off. There is redness to 5th finger and an area where skin has been cut.

## 2024-09-03 NOTE — Discharge Instructions (Addendum)
 Go to the nearest ED right to get this ring off.

## 2024-09-03 NOTE — ED Provider Notes (Signed)
 [place] EMERGENCY DEPARTMENT AT [facility] Provider Note   CSN: [id number] Arrival date & time: 09/03/24  9052     Patient presents with: No chief complaint on file.   Mark Galloway is a 38 y.o. male patient who presents to the emergency department today for further evaluation of a ring on the left pinky.  Attempted to get the ring off in urgent care and was sent to the emergency department today for further evaluation.  Ring has been on the pinky finger for approximately 2 weeks.   HPI     Prior to Admission medications   Medication Sig Start Date End Date Taking? Authorizing Provider  bictegravir-emtricitabine -tenofovir  AF (BIKTARVY ) 50-200-25 MG TABS tablet Take 1 tablet by mouth daily. 07/09/24   Calone, Gregory D, [name]  ferrous sulfate  325 (65 FE) MG tablet Take 1 tablet (325 mg total) by mouth daily with lunch. 04/24/24   Jillian Buttery, MD  nicotine  (NICODERM CQ  - DOSED IN MG/24 HOURS) 21 mg/24hr patch Place 1 patch (21 mg total) onto the skin daily. 04/01/24   Jadapalle, Sree, MD  OLANZapine  (ZYPREXA ) 15 MG tablet Take 1 tablet (15 mg total) by mouth 2 (two) times daily. 07/09/24   Calone, Gregory D, [name]    Allergies: Peanut oil and Peanut-containing drug products    Review of Systems  All other systems reviewed and are negative.   Updated Vital Signs BP (!) 123/109 (BP Location: Right Arm)   Pulse 80   Temp 97.6 F (36.4 C)   Resp 18   SpO2 98%   Physical Exam Vitals and nursing note reviewed.  Constitutional:      Appearance: Normal appearance.  HENT:     Head: Normocephalic and atraumatic.  Eyes:     General:        Right eye: No discharge.        Left eye: No discharge.     Conjunctiva/sclera: Conjunctivae normal.  Pulmonary:     Effort: Pulmonary effort is normal.  Musculoskeletal:     Comments: Swollen left fifth digit.  Ring at the base.  Skin:    General: Skin is warm and dry.     Findings: No rash.  Neurological:     General: No  focal deficit present.     Mental Status: He is alert.  Psychiatric:        Mood and Affect: Mood normal.        Behavior: Behavior normal.     (all labs ordered are listed, but only abnormal results are displayed) Labs Reviewed - No data to display  EKG: None  Radiology: No results found.   .Foreign Body Removal  Date/Time: 09/03/2024 11:37 AM  Performed by: Theotis Cameron HERO, PA-C Authorized by: Theotis Cameron HERO, PA-C  Consent: Verbal consent obtained Consent given by: patient Patient understanding: patient states understanding of the procedure being performed Patient consent: the patient's understanding of the procedure matches consent given Procedure consent: procedure consent matches procedure scheduled Relevant documents: relevant documents present and verified Test results: test results available and properly labeled Patient identity confirmed: verbally with patient Intake: finger. 0 objects recovered. Post-procedure assessment: foreign body not removed Patient tolerance: patient tolerated the procedure well with no immediate complications Comments: Ring removal unsuccessful. Patient terminated and eloped.      Medications Ordered in the ED - No data to display   Medical Decision Making Mark Galloway is a 38 y.o. male patient who presents to the emergency department today  for further evaluation of a ring on the finger.  I attempted to get the ring off with the Dolphin ring cutter.  This was a 2 level to ring I was able to get the top layer removed and a good piece of the second base ring.  However, the finger was so swollen I need to make a third cut.  During the third cut the patient became very agitated and stated that he wanted to leave. I offered a digital block on the finger which patient declined. He then proceeded to steal the lubricant gel for the Dolphin ring cutter.  I urged the patient to return at this as it belonged to the hospital.  He proceeded to open  up the lubricant dump it all over the floor and throw it at me.  The ring is still on the finger and patient walked out before we could discuss risks of not removing the ring from the finger.     Final diagnoses:  Constrictive jewelry of finger, initial encounter    ED Discharge Orders     None          Theotis Cameron HERO, [place] 09/03/24 1141    Ula Prentice SAUNDERS, MD 09/03/24 [phone]

## 2024-09-03 NOTE — ED Provider Notes (Addendum)
 [facility]    CSN: [id number] Arrival date & time: 09/03/24  0820     History   Chief Complaint Chief Complaint  Patient presents with   swollen finger   HPI Mark Galloway is a 38 y.o. male.   Patient presents today for a ring stuck on his left pinky finger.  He states that a friend gave him the ring and it was too small but he forced it out anyway.  He did visit the emergency department on 09/01/2024 and they did attempt removal but patient left due to pain and that his finger was caught.  His finger has been becoming more red and swollen.  He is here to see if he can get this removed today.     Past Medical History:  Diagnosis Date   Bipolar 1 disorder ([name])    HIV infection ([name])    Schizophrenia ([name])    Shingles     Patient Active Problem List   Diagnosis Date Noted   Poor social situation 07/09/2024   Nonadherence to medical treatment 04/06/2024   Cellulitis of hand 04/05/2024   Macrocytic anemia 04/05/2024   MRSA infection 03/23/2024   Tenosynovitis of finger 03/23/2024   Osteomyelitis of right hand ([name]) 03/21/2024   Cellulitis of foot, left 03/21/2024   HTN (hypertension) 03/21/2024   Wound infection after surgery 03/17/2024   Hyponatremia 03/17/2024   Paranoid schizophrenia ([name]) 03/14/2024   Cellulitis of right hand 02/22/2024   Cellulitis 02/22/2024   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  Authorizing Provider  bictegravir-emtricitabine -tenofovir  AF (BIKTARVY ) 50-200-25 MG TABS tablet Take 1 tablet by mouth daily. 07/09/24   Calone, Gregory D, [name]  ferrous sulfate  325 (65 FE) MG tablet Take 1 tablet (325 mg total) by mouth daily with lunch. 04/24/24   Jillian Buttery, MD  nicotine  (NICODERM CQ  - DOSED IN MG/24 HOURS) 21 mg/24hr patch Place 1 patch (21 mg total) onto the skin daily. 04/01/24   Donnelly Mellow, MD  OLANZapine  (ZYPREXA ) 15 MG tablet Take 1 tablet (15 mg total) by mouth 2 (two) times daily. 07/09/24   Calone, Gregory D, [name]    Family History No family history on file.  Social History Social History   Tobacco Use   Smoking status: Some Days    Types: Cigars, Cigarettes   Smokeless tobacco: Never   Tobacco comments:    2 per week  Vaping Use   Vaping status: Never Used  Substance Use Topics   Alcohol use: Yes    Comment: 3-4 per week   Drug use: Never     Allergies   Peanut oil and Peanut-containing drug products   Review of Systems Review of Systems  Constitutional: Negative.   HENT: Negative.    Eyes: Negative.   Respiratory: Negative.    Cardiovascular: Negative.   Gastrointestinal: Negative.      Physical Exam Triage  Vital Signs ED Triage Vitals [09/03/24 0837]  Encounter Vitals Group     BP 116/76     Girls Systolic BP Percentile      Girls Diastolic BP Percentile      Boys Systolic BP Percentile      Boys Diastolic BP Percentile      Pulse Rate 80     Resp 15     Temp 98.4 F (36.9 C)     Temp Source Oral     SpO2 97 %     Weight      Height      Head Circumference      Peak Flow      Pain Score 0     Pain Loc      Pain Education      Exclude from Growth Chart    No data found.  Updated Vital Signs BP 116/76 (BP Location: Right Arm)   Pulse 80   Temp 98.4 F (36.9 C) (Oral)   Resp 15   SpO2 97%   Visual Acuity Right Eye Distance:   Left Eye Distance:   Bilateral Distance:    Right Eye Near:   Left Eye Near:     Bilateral Near:     Physical Exam Constitutional:      Appearance: Normal appearance.  HENT:     Head: Normocephalic and atraumatic.     Nose: Nose normal.     Mouth/Throat:     Mouth: Mucous membranes are moist.     Pharynx: Oropharynx is clear.  Eyes:     Extraocular Movements: Extraocular movements intact.     Pupils: Pupils are equal, round, and reactive to light.  Cardiovascular:     Rate and Rhythm: Normal rate.  Pulmonary:     Effort: Pulmonary effort is normal.  Skin:    General: Skin is warm.     Comments: Left ring finger has a ring that is bound tightly around the base.  Distal to this there is erythema and edema.  Capillary refill is normal.  Sensation is intact.  Neurological:     Mental Status: He is alert.     UC Treatments / Results  Labs (all labs ordered are listed, but only abnormal results are displayed) Labs Reviewed - No data to display  EKG   Radiology No results found.  Procedures Ring removal was attempted today here in the office.  Both with trauma shears as well as electrical ring removing device.  Device was able to be put on ring with some discomfort but patient tolerated well.  Pressure was applied and electrical tool was used to attempt to grind the ring down.  This was unsuccessful after about 5 minutes of heart pressure.  Once the device was removed, hardly any part of the ring had been affected.  Removal of the ring was unsuccessful.  Medications Ordered in UC Medications - No data to display  Initial Impression / Assessment and Plan / UC Course  I have reviewed the triage vital signs and the nursing notes.  Pertinent labs & imaging results that were available during my care of the patient were reviewed by me and considered in my medical decision making (see chart for details).  I discussed with patient that we were unsuccessful in being able to even make progress in attempting to remove the string.  He is unsure of the material.   I instructed him that this ring must come off today due to the  swelling and redness.  He understood and I instructed him to go to straight to the emergency department.  Because he had a poor experience on the second, I recommended that he request something for nerves and/or pain and then have the ring removed today.  I reiterated that if he does not get this ring removed to soon as possible, there is a risk of losing his finger.  Final Clinical Impressions(s) / UC Diagnoses   Final diagnoses:  Tight ring on finger     Discharge Instructions      Go to the nearest ED right to get this ring off.       ED Prescriptions   None    PDMP not reviewed this encounter.   Jilda Krystal HERO, DO 09/03/24 0947    Jilda Krystal HERO, DO 09/03/24 0947    Jilda Krystal HERO, DO 09/03/24 [phone]

## 2024-09-03 NOTE — ED Notes (Signed)
 Per MD pt eloped and left AMA.

## 2024-09-07 ENCOUNTER — Other Ambulatory Visit: Payer: Self-pay

## 2024-09-07 ENCOUNTER — Telehealth: Payer: Self-pay

## 2024-09-07 DIAGNOSIS — B20 Human immunodeficiency virus [HIV] disease: Secondary | ICD-10-CM

## 2024-09-07 DIAGNOSIS — Z79899 Other long term (current) drug therapy: Secondary | ICD-10-CM

## 2024-09-07 DIAGNOSIS — Z113 Encounter for screening for infections with a predominantly sexual mode of transmission: Secondary | ICD-10-CM

## 2024-09-07 NOTE — Telephone Encounter (Signed)
 Patient called, he lost some of his Biktarvy  and is requesting an early fill. Advised that he has refills on file and will need to reach out to the pharmacy to see if they can process and early refill.   Daden Mahany, BSN, RN

## 2024-09-14 ENCOUNTER — Other Ambulatory Visit

## 2024-09-14 ENCOUNTER — Other Ambulatory Visit: Payer: Self-pay

## 2024-09-14 DIAGNOSIS — B20 Human immunodeficiency virus [HIV] disease: Secondary | ICD-10-CM

## 2024-09-14 DIAGNOSIS — Z113 Encounter for screening for infections with a predominantly sexual mode of transmission: Secondary | ICD-10-CM

## 2024-09-16 ENCOUNTER — Telehealth: Payer: Self-pay

## 2024-09-16 NOTE — Telephone Encounter (Signed)
 Detectable Viral Load Intervention (DVL)  Most recent VL:  HIV 1 RNA Quant  Date Value Ref Range Status  07/09/2024 214 (H) NOT DETECTED copies/mL Final  03/15/2024 80 copies/mL Corrected    Comment:    (NOTE) The reportable range for this assay is 20 to 10,000,000 copies HIV-1 RNA/mL.   04/22/2023 93 (H) Copies/mL Final    Last Clinic Visit: 07/09/24  Current ART regimen: Biktarvy   Appointment status: patient has future appointment scheduled  Medication last dispensed (per chart review):  Amoxicillin    Amoxicillin -Pot Clavulanate   Benztropine Mesylate   Bictegravir-Emtricitab-Tenofov   Dispensed Days Supply Quantity Provider Pharmacy  BIKTARVY  50-200-25 MG TABLET 08/18/2024 30 30 each Calone, Gregory D, [name] [facility] [phone] - G...  BIKTARVY  50-200-25 MG TABLET 07/09/2024 30 30 each Calone, Gregory D, [name] [facility] [phone] - G...  bictegravir-emtricitabine -tenofovir  AF (BIKTARVY ) 50-200-25 MG TABS tablet 04/21/2024 30 30 tablet Luiz Channel, MD [facility]...  BIKTARVY  50-200-25 MG TABLET 03/31/2024 30 30 each Jadapalle, Sree, MD [facility] [phone] - G...  bictegravir-emtricitabine -tenofovir  AF (BIKTARVY ) 50-200-25 MG TABS tablet 02/24/2024 30 30 tablet Pokhrel, Laxman, MD [place] [facility].      Medication Adherence  Not able to assess    Barriers to Care   Not able to assess   Interventions   Called patient to discuss medication adherence and possible barriers to care.  Not able to assess, call answered but disconnected by pt. Called x2. Has upcoming appt

## 2024-09-21 ENCOUNTER — Emergency Department (HOSPITAL_COMMUNITY)
Admission: EM | Admit: 2024-09-21 | Discharge: 2024-09-21 | Disposition: A | Discharge status: Home / Self Care | Attending: Emergency Medicine | Admitting: Emergency Medicine

## 2024-09-21 ENCOUNTER — Emergency Department (HOSPITAL_COMMUNITY)

## 2024-09-21 ENCOUNTER — Encounter (HOSPITAL_COMMUNITY): Payer: Self-pay | Admitting: Emergency Medicine

## 2024-09-21 DIAGNOSIS — Z91010 Allergy to peanuts: Secondary | ICD-10-CM | POA: Insufficient documentation

## 2024-09-21 DIAGNOSIS — A419 Sepsis, unspecified organism: Secondary | ICD-10-CM | POA: Diagnosis not present

## 2024-09-21 DIAGNOSIS — M79641 Pain in right hand: Secondary | ICD-10-CM | POA: Insufficient documentation

## 2024-09-21 DIAGNOSIS — J189 Pneumonia, unspecified organism: Secondary | ICD-10-CM | POA: Diagnosis not present

## 2024-09-21 MED ORDER — IBUPROFEN 400 MG PO TABS
600.0000 mg | ORAL_TABLET | Freq: Once | ORAL | Status: AC
  Administered 2024-09-21: 600 mg via ORAL
  Filled 2024-09-21: qty 1

## 2024-09-21 NOTE — ED Triage Notes (Signed)
 Pt received 650mg  of tylenol  from ems

## 2024-09-21 NOTE — ED Provider Notes (Signed)
 [place] EMERGENCY DEPARTMENT AT [facility] Provider Note   CSN: [id number] Arrival date & time: 09/21/24  0126     Patient presents with: Hand Pain   Mark Galloway is a 38 y.o. male with history of a, poorly compliant with antiretroviral therapy and history of osteomyelitis of the right pinky earlier this year that required surgical washout secondary to abscess and hospitalization for MRSA and ESBL Klebsiella infection in the hand.  That was in April 2025.  Following his surgery for washout, patient had severe scarring of the pinky which is now completely retracted with both the PIP and DIP joints in flexion.  He states tonight he was going to pull himself up on a fence and his pinky got caught on the fence and the pinky was forcibly extended resulting in pain.  He denies any redness, swelling, discomfort to the finger prior to his injury tonight.  No recent fevers or chills.   HPI     Prior to Admission medications   Medication Sig Start Date End Date Taking? Authorizing Provider  bictegravir-emtricitabine -tenofovir  AF (BIKTARVY ) 50-200-25 MG TABS tablet Take 1 tablet by mouth daily. 07/09/24   Calone, Gregory D, [name]  ferrous sulfate  325 (65 FE) MG tablet Take 1 tablet (325 mg total) by mouth daily with lunch. 04/24/24   Jillian Buttery, MD  nicotine  (NICODERM CQ  - DOSED IN MG/24 HOURS) 21 mg/24hr patch Place 1 patch (21 mg total) onto the skin daily. 04/01/24   Donnelly Mellow, MD  OLANZapine  (ZYPREXA ) 15 MG tablet Take 1 tablet (15 mg total) by mouth 2 (two) times daily. 07/09/24   Calone, Gregory D, [name]    Allergies: Peanut oil and Peanut-containing drug products    Review of Systems  Musculoskeletal:        R pinky pain    Updated Vital Signs BP 116/67 (BP Location: Left Arm)   Pulse 82   Temp 98.4 F (36.9 C) (Oral)   Resp 18   SpO2 98%   Physical Exam Vitals and nursing note reviewed.  Constitutional:      Appearance: He is not ill-appearing or  toxic-appearing.  HENT:     Head: Normocephalic and atraumatic.  Eyes:     General: No scleral icterus.       Right eye: No discharge.        Left eye: No discharge.     Conjunctiva/sclera: Conjunctivae normal.  Pulmonary:     Effort: Pulmonary effort is normal.  Musculoskeletal:       Hands:  Skin:    General: Skin is warm and dry.  Neurological:     General: No focal deficit present.     Mental Status: He is alert.  Psychiatric:        Mood and Affect: Mood normal.     (all labs ordered are listed, but only abnormal results are displayed) Labs Reviewed - No data to display  EKG: None  Radiology: DG Hand Complete Right Result Date: 09/21/2024 CLINICAL DATA:  39 year old male with right 5th finger pain. History of osteomyelitis. EXAM: RIGHT HAND - COMPLETE 3+ VIEW COMPARISON:  Right 5th finger series 04/05/2024. FINDINGS: Three views, the right 5th finger is completely flexed on all views. No fracture or dislocation. But there does appear to be osteolysis of the tuft of the 5th distal phalanx compared to 04/05/2024. Normal background bone mineralization. No other osteolysis. Maintained joint spaces and alignment. No discrete soft tissue swelling or gas. IMPRESSION: 1. Right 5th finger  flexed on all views, limiting some detail. 2. Evidence of osteolysis of the tuft of the 5th distal phalanx since 04/05/2024. Consider acute or chronic Osteomyelitis. 3. No other acute osseous abnormality identified. Electronically Signed   By: VEAR Hurst M.D.   On: 09/21/2024 04:05     Procedures   Medications Ordered in the ED  ibuprofen  (ADVIL ) tablet 600 mg (600 mg Oral Given 09/21/24 9297)                                    Medical Decision Making 38 year old male who presents with concern for right hand injury today.  Normal vitals on intake. Exam as above without evidence of infectious etiology.  Finger is contracted at baseline.   Amount and/or Complexity of Data Reviewed Radiology:  ordered.    Details: X-ray consistent with chronic osteomyelitis.  Clinical picture most consistent with acute injury from his fall tonight, no evidence of infectious etiology of patient's symptoms.  Aziz voiced understanding of her medical evaluation and treatment plan. Each of their questions answered to their expressed satisfaction.  Return precautions were given.  Patient is well-appearing, stable, and was discharged in good condition.  This chart was dictated using voice recognition software, Dragon. Despite the best efforts of this provider to proofread and correct errors, errors may still occur which can change documentation meaning.      Final diagnoses:  Right hand pain    ED Discharge Orders     None          Bobette Pleasant JONELLE DEVONNA 09/21/24 9294    Mesner, Selinda, MD 09/21/24 [phone]

## 2024-09-21 NOTE — ED Triage Notes (Signed)
 [place] with c/o right pinky pain on his right hand from an old injury from 2 months ago

## 2024-09-21 NOTE — Discharge Instructions (Signed)
 You are seen in the ER today for your hand pain.  Please follow-up with your hand surgeon outpatient for further discussion about scar revision.  He may use Tylenol  or ibuprofen  over-the-counter for your discomfort.  Return to the ER with any severe symptoms.

## 2024-09-24 ENCOUNTER — Emergency Department (HOSPITAL_COMMUNITY)

## 2024-09-24 ENCOUNTER — Inpatient Hospital Stay (HOSPITAL_COMMUNITY)
Admission: EM | Admit: 2024-09-24 | Discharge: 2024-09-27 | DRG: 974 | Disposition: A | Discharge status: Home / Self Care | Attending: Internal Medicine | Admitting: Internal Medicine

## 2024-09-24 ENCOUNTER — Encounter (HOSPITAL_COMMUNITY): Payer: Self-pay

## 2024-09-24 ENCOUNTER — Other Ambulatory Visit: Payer: Self-pay

## 2024-09-24 DIAGNOSIS — F200 Paranoid schizophrenia: Secondary | ICD-10-CM | POA: Diagnosis present

## 2024-09-24 DIAGNOSIS — Z889 Allergy status to unspecified drugs, medicaments and biological substances status: Secondary | ICD-10-CM

## 2024-09-24 DIAGNOSIS — J180 Bronchopneumonia, unspecified organism: Secondary | ICD-10-CM | POA: Diagnosis present

## 2024-09-24 DIAGNOSIS — N179 Acute kidney failure, unspecified: Secondary | ICD-10-CM | POA: Diagnosis present

## 2024-09-24 DIAGNOSIS — B20 Human immunodeficiency virus [HIV] disease: Secondary | ICD-10-CM | POA: Diagnosis present

## 2024-09-24 DIAGNOSIS — F319 Bipolar disorder, unspecified: Secondary | ICD-10-CM | POA: Diagnosis present

## 2024-09-24 DIAGNOSIS — Z5900 Homelessness unspecified: Secondary | ICD-10-CM | POA: Diagnosis not present

## 2024-09-24 DIAGNOSIS — I10 Essential (primary) hypertension: Secondary | ICD-10-CM | POA: Diagnosis present

## 2024-09-24 DIAGNOSIS — F17210 Nicotine dependence, cigarettes, uncomplicated: Secondary | ICD-10-CM | POA: Diagnosis present

## 2024-09-24 DIAGNOSIS — T50996A Underdosing of other drugs, medicaments and biological substances, initial encounter: Secondary | ICD-10-CM | POA: Diagnosis present

## 2024-09-24 DIAGNOSIS — Z79899 Other long term (current) drug therapy: Secondary | ICD-10-CM | POA: Diagnosis not present

## 2024-09-24 DIAGNOSIS — J189 Pneumonia, unspecified organism: Secondary | ICD-10-CM | POA: Diagnosis present

## 2024-09-24 DIAGNOSIS — Z21 Asymptomatic human immunodeficiency virus [HIV] infection status: Secondary | ICD-10-CM | POA: Diagnosis present

## 2024-09-24 DIAGNOSIS — U071 COVID-19: Principal | ICD-10-CM | POA: Diagnosis present

## 2024-09-24 DIAGNOSIS — R6520 Severe sepsis without septic shock: Secondary | ICD-10-CM | POA: Diagnosis present

## 2024-09-24 DIAGNOSIS — Z91018 Allergy to other foods: Secondary | ICD-10-CM

## 2024-09-24 DIAGNOSIS — E869 Volume depletion, unspecified: Secondary | ICD-10-CM | POA: Diagnosis present

## 2024-09-24 DIAGNOSIS — A419 Sepsis, unspecified organism: Principal | ICD-10-CM | POA: Diagnosis present

## 2024-09-24 LAB — URINALYSIS, ROUTINE W REFLEX MICROSCOPIC
Bilirubin Urine: NEGATIVE
Glucose, UA: NEGATIVE mg/dL
Ketones, ur: NEGATIVE mg/dL
Leukocytes,Ua: NEGATIVE
Nitrite: NEGATIVE
Protein, ur: 30 mg/dL — AB
Specific Gravity, Urine: 1.02 (ref 1.005–1.030)
pH: 5 (ref 5.0–8.0)

## 2024-09-24 LAB — COMPREHENSIVE METABOLIC PANEL WITH GFR
ALT: 19 U/L (ref 0–44)
AST: 35 U/L (ref 15–41)
Albumin: 3.6 g/dL (ref 3.5–5.0)
Alkaline Phosphatase: 77 U/L (ref 38–126)
Anion gap: 12 (ref 5–15)
BUN: 25 mg/dL — ABNORMAL HIGH (ref 6–20)
CO2: 22 mmol/L (ref 22–32)
Calcium: 8.6 mg/dL — ABNORMAL LOW (ref 8.9–10.3)
Chloride: 98 mmol/L (ref 98–111)
Creatinine, Ser: 1.58 mg/dL — ABNORMAL HIGH (ref 0.61–1.24)
GFR, Estimated: 57 mL/min — ABNORMAL LOW (ref >60)
Glucose, Bld: 107 mg/dL — ABNORMAL HIGH (ref 70–99)
Potassium: 4.2 mmol/L (ref 3.5–5.1)
Sodium: 132 mmol/L — ABNORMAL LOW (ref 135–145)
Total Bilirubin: 0.6 mg/dL (ref 0.0–1.2)
Total Protein: 8 g/dL (ref 6.5–8.1)

## 2024-09-24 LAB — CBC WITH DIFFERENTIAL/PLATELET
Basophils Absolute: 0 K/uL (ref 0.0–0.1)
Basophils Relative: 0 %
Eosinophils Absolute: 0 K/uL (ref 0.0–0.5)
Eosinophils Relative: 0 %
HCT: 38.5 % — ABNORMAL LOW (ref 39.0–52.0)
Hemoglobin: 13 g/dL (ref 13.0–17.0)
Lymphocytes Relative: 12 %
Lymphs Abs: 2.2 K/uL (ref 0.7–4.0)
MCH: 32.8 pg (ref 26.0–34.0)
MCHC: 33.8 g/dL (ref 30.0–36.0)
MCV: 97.2 fL (ref 80.0–100.0)
Monocytes Absolute: 0.9 K/uL (ref 0.1–1.0)
Monocytes Relative: 5 %
Neutro Abs: 14.9 K/uL — ABNORMAL HIGH (ref 1.7–7.7)
Neutrophils Relative %: 83 %
Platelets: 259 K/uL (ref 150–400)
RBC: 3.96 MIL/uL — ABNORMAL LOW (ref 4.22–5.81)
RDW: 13.4 % (ref 11.5–15.5)
WBC: 18 K/uL — ABNORMAL HIGH (ref 4.0–10.5)
nRBC: 0 % (ref 0.0–0.2)

## 2024-09-24 LAB — RESP PANEL BY RT-PCR (RSV, FLU A&B, COVID)  RVPGX2
Influenza A by PCR: NEGATIVE
Influenza B by PCR: NEGATIVE
Resp Syncytial Virus by PCR: NEGATIVE
SARS Coronavirus 2 by RT PCR: POSITIVE — AB

## 2024-09-24 LAB — I-STAT CG4 LACTIC ACID, ED: Lactic Acid, Venous: 2.3 mmol/L (ref 0.5–1.9)

## 2024-09-24 MED ORDER — LACTATED RINGERS IV SOLN
150.0000 mL/h | INTRAVENOUS | Status: AC
  Administered 2024-09-24 – 2024-09-25 (×3): 150 mL/h via INTRAVENOUS

## 2024-09-24 MED ORDER — GUAIFENESIN ER 600 MG PO TB12
600.0000 mg | ORAL_TABLET | Freq: Two times a day (BID) | ORAL | Status: DC
  Administered 2024-09-24 – 2024-09-27 (×6): 600 mg via ORAL
  Filled 2024-09-24 (×6): qty 1

## 2024-09-24 MED ORDER — SODIUM CHLORIDE 0.9 % IV BOLUS
1000.0000 mL | Freq: Once | INTRAVENOUS | Status: AC
Start: 2024-09-24 — End: 2024-09-24
  Administered 2024-09-24: 1000 mL via INTRAVENOUS

## 2024-09-24 MED ORDER — ACETAMINOPHEN 325 MG PO TABS: 650.0000 mg | ORAL_TABLET | Freq: Four times a day (QID) | ORAL | Status: DC | PRN

## 2024-09-24 MED ORDER — ACETAMINOPHEN 500 MG PO TABS
1000.0000 mg | ORAL_TABLET | Freq: Once | ORAL | Status: AC
  Administered 2024-09-24: 1000 mg via ORAL
  Filled 2024-09-24: qty 2

## 2024-09-24 MED ORDER — BICTEGRAVIR-EMTRICITAB-TENOFOV 50-200-25 MG PO TABS
1.0000 | ORAL_TABLET | Freq: Every day | ORAL | Status: DC
  Administered 2024-09-25 – 2024-09-27 (×4): 1 via ORAL
  Filled 2024-09-24 (×7): qty 1

## 2024-09-24 MED ORDER — OLANZAPINE 5 MG PO TABS
15.0000 mg | ORAL_TABLET | Freq: Two times a day (BID) | ORAL | Status: DC
  Administered 2024-09-24 – 2024-09-27 (×6): 15 mg via ORAL
  Filled 2024-09-24 (×6): qty 1

## 2024-09-24 MED ORDER — SODIUM CHLORIDE 0.9% FLUSH
3.0000 mL | Freq: Two times a day (BID) | INTRAVENOUS | Status: DC
  Administered 2024-09-25 – 2024-09-26 (×3): 3 mL via INTRAVENOUS

## 2024-09-24 MED ORDER — ACETAMINOPHEN 650 MG RE SUPP: 650.0000 mg | Freq: Four times a day (QID) | RECTAL | Status: DC | PRN

## 2024-09-24 MED ORDER — SODIUM CHLORIDE 0.9 % IV SOLN
500.0000 mg | INTRAVENOUS | Status: DC
  Administered 2024-09-25 – 2024-09-26 (×2): 500 mg via INTRAVENOUS
  Filled 2024-09-24 (×4): qty 5

## 2024-09-24 MED ORDER — SODIUM CHLORIDE 0.9 % IV BOLUS
1000.0000 mL | Freq: Once | INTRAVENOUS | Status: AC
  Administered 2024-09-24: 1000 mL via INTRAVENOUS

## 2024-09-24 MED ORDER — ENOXAPARIN SODIUM 40 MG/0.4ML IJ SOSY
40.0000 mg | PREFILLED_SYRINGE | INTRAMUSCULAR | Status: DC
  Administered 2024-09-24 – 2024-09-26 (×3): 40 mg via SUBCUTANEOUS
  Filled 2024-09-24 (×3): qty 0.4

## 2024-09-24 MED ORDER — SODIUM CHLORIDE 0.9 % IV SOLN
500.0000 mg | Freq: Once | INTRAVENOUS | Status: AC
  Administered 2024-09-24: 500 mg via INTRAVENOUS
  Filled 2024-09-24: qty 5

## 2024-09-24 MED ORDER — ONDANSETRON HCL 4 MG PO TABS: 4.0000 mg | ORAL_TABLET | Freq: Four times a day (QID) | ORAL | Status: DC | PRN

## 2024-09-24 MED ORDER — ONDANSETRON HCL 4 MG/2ML IJ SOLN: 4.0000 mg | Freq: Four times a day (QID) | INTRAMUSCULAR | Status: DC | PRN

## 2024-09-24 MED ORDER — IOHEXOL 350 MG/ML SOLN
75.0000 mL | Freq: Once | INTRAVENOUS | Status: AC | PRN
  Administered 2024-09-24: 75 mL via INTRAVENOUS

## 2024-09-24 MED ORDER — SODIUM CHLORIDE 0.9 % IV SOLN
2.0000 g | Freq: Once | INTRAVENOUS | Status: AC
  Administered 2024-09-24: 2 g via INTRAVENOUS
  Filled 2024-09-24: qty 20

## 2024-09-24 MED ORDER — SENNOSIDES-DOCUSATE SODIUM 8.6-50 MG PO TABS: 1.0000 | ORAL_TABLET | Freq: Every evening | ORAL | Status: DC | PRN

## 2024-09-24 MED ORDER — SODIUM CHLORIDE 0.9 % IV SOLN
2.0000 g | INTRAVENOUS | Status: DC
  Administered 2024-09-25 – 2024-09-26 (×2): 2 g via INTRAVENOUS
  Filled 2024-09-24 (×2): qty 20

## 2024-09-24 MED ORDER — BISACODYL 5 MG PO TBEC: 5.0000 mg | DELAYED_RELEASE_TABLET | Freq: Every day | ORAL | Status: DC | PRN

## 2024-09-24 NOTE — ED Provider Notes (Signed)
 [place] EMERGENCY DEPARTMENT AT [facility] Provider Note  CSN: [id number] Arrival date & time: 09/24/24 1345  Chief Complaint(s) Fatigue  HPI Mark Galloway is a 38 y.o. male history of HIV/AIDS, schizophrenia, bipolar disorder presenting to the emergency department with fever.  Patient reports generalized weakness, fevers, body aches, cough, for the past week.  Has been off his Biktarvy  for around 10 days, reports he was compliant with it before this.  Denies any headaches, chest pain, difficulty breathing.  Reports some abdominal pain, worse in the left lower abdomen.  He also reports diarrhea without blood in his stool.  No sick contacts.  Does report some mild runny nose and sore throat.   Past Medical History Past Medical History:  Diagnosis Date   Bipolar 1 disorder ([name])    HIV infection ([name])    Schizophrenia ([name])    Shingles    Patient Active Problem List   Diagnosis Date Noted   Poor social situation 07/09/2024   Nonadherence to medical treatment 04/06/2024   Cellulitis of hand 04/05/2024   Macrocytic anemia 04/05/2024   MRSA infection 03/23/2024   Tenosynovitis of finger 03/23/2024   Osteomyelitis of right hand ([name]) 03/21/2024   Cellulitis of foot, left 03/21/2024   HTN (hypertension) 03/21/2024   Wound infection after surgery 03/17/2024   Hyponatremia 03/17/2024   Paranoid schizophrenia ([name]) 03/14/2024   Cellulitis of right hand 02/22/2024   Cellulitis 02/22/2024   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 Authorizing Provider  bictegravir-emtricitabine -tenofovir  AF (BIKTARVY ) 50-200-25 MG TABS tablet Take 1 tablet by mouth daily. 07/09/24   Calone, Gregory D, [name]  ferrous  sulfate 325 (65 FE) MG tablet Take 1 tablet (325 mg total) by mouth daily with lunch. 04/24/24   Jillian Buttery, MD  nicotine  (NICODERM CQ  - DOSED IN MG/24 HOURS) 21 mg/24hr patch Place 1 patch (21 mg total) onto the skin daily. 04/01/24   Jadapalle, Sree, MD  OLANZapine  (ZYPREXA ) 15 MG tablet Take 1 tablet (15 mg total) by mouth 2 (two) times daily. 07/09/24   Philemon Cordella BIRCH, [name]                                                                                                                                    Past Surgical History Past Surgical History:  Procedure Laterality Date   I & D EXTREMITY Right 02/22/2024   Procedure: IRRIGATION AND DEBRIDEMENT EXTREMITY;  Surgeon: Murrell Drivers, MD;  Location: [facility] OR;  Service: Orthopedics;  Laterality: Right;   IRRIGATION AND DEBRIDEMENT FOOT Left    Family History History reviewed. No pertinent family history.  Social History Social History  Tobacco Use   Smoking status: Some Days    Types: Cigars, Cigarettes   Smokeless tobacco: Never   Tobacco comments:    2 per week  Vaping Use   Vaping status: Never Used  Substance Use Topics   Alcohol use: Yes    Comment: 3-4 per week   Drug use: Never   Allergies Peanut oil and Peanut-containing drug products  Review of Systems Review of Systems  All other systems reviewed and are negative.   Physical Exam Vital Signs  I have reviewed the triage vital signs BP 109/64 (BP Location: Right Arm)   Pulse (!) 107   Temp (!) 100.5 F (38.1 C)   Resp (!) 23   Ht 5' 9 (1.753 m)   Wt 77.1 kg   SpO2 99%   BMI 25.10 kg/m  Physical Exam Vitals and nursing note reviewed.  Constitutional:      General: He is not in acute distress.    Appearance: Normal appearance.  HENT:     Mouth/Throat:     Mouth: Mucous membranes are moist.  Eyes:     Conjunctiva/sclera: Conjunctivae normal.  Cardiovascular:     Rate and Rhythm: Regular rhythm. Tachycardia present.  Pulmonary:     Effort: Pulmonary  effort is normal. No respiratory distress.     Breath sounds: Normal breath sounds.  Abdominal:     General: Abdomen is flat.     Palpations: Abdomen is soft.     Tenderness: There is abdominal tenderness (LLQ).  Musculoskeletal:     Right lower leg: No edema.     Left lower leg: No edema.  Skin:    General: Skin is warm and dry.     Capillary Refill: Capillary refill takes less than 2 seconds.  Neurological:     Mental Status: He is alert and oriented to person, place, and time. Mental status is at baseline.  Psychiatric:        Mood and Affect: Mood normal.        Behavior: Behavior normal.     ED Results and Treatments Labs (all labs ordered are listed, but only abnormal results are displayed) Labs Reviewed  RESP PANEL BY RT-PCR (RSV, FLU A&B, COVID)  RVPGX2 - Abnormal; Notable for the following components:      Result Value   SARS Coronavirus 2 by RT PCR POSITIVE (*)    All other components within normal limits  URINALYSIS, ROUTINE W REFLEX MICROSCOPIC - Abnormal; Notable for the following components:   Hgb urine dipstick SMALL (*)    Protein, ur 30 (*)    Bacteria, UA RARE (*)    All other components within normal limits  CULTURE, BLOOD (ROUTINE X 2)  CULTURE, BLOOD (ROUTINE X 2)  COMPREHENSIVE METABOLIC PANEL WITH GFR  CBC WITH DIFFERENTIAL/PLATELET  I-STAT CG4 LACTIC ACID, ED  Radiology No results found.  Pertinent labs & imaging results that were available during my care of the patient were reviewed by me and considered in my medical decision making (see MDM for details).  Medications Ordered in ED Medications  sodium chloride  0.9 % bolus 1,000 mL (has no administration in time range)  acetaminophen  (TYLENOL ) tablet 1,000 mg (has no administration in time range)                                                                                                                                      Procedures Procedures  (including critical care time)  Medical Decision Making / ED Course   MDM:  38 year old presenting to the emergency department with fever.  Patient overall well-appearing, vitals notable for mild tachycardia, fever.  Differential includes viral URI, pneumonia, opportunistic infection, intra-abdominal process such as diverticulitis or appendicitis.  Does have fever.  Last CD4 count was improved at 218 but still on the lower side.  Additionally has been off his medication.  Will check labs including cultures CBC, lactate.  Does have some abdominal tenderness will obtain CT scan.  Patient's COVID test was positive suspect likely cause of symptoms is COVID.  Further workup is reassuring likely discharge.   Clinical Course as of 09/24/24 1616  Thu Sep 24, 2024  1615 Signed out to Dr. Emil pending workup, if reassuring likely discharge given + covid testing.  [WS]    Clinical Course User Index [WS] Francesca Elsie CROME, MD     Additional history obtained: -Additional history obtained from friend -External records from outside source obtained and reviewed including: Chart review including previous notes, labs, imaging, consultation notes including prior notes    Lab Tests: -I ordered, reviewed, and interpreted labs.   The pertinent results include:   Labs Reviewed  RESP PANEL BY RT-PCR (RSV, FLU A&B, COVID)  RVPGX2 - Abnormal; Notable for the following components:      Result Value   SARS Coronavirus 2 by RT PCR POSITIVE (*)    All other components within normal limits  URINALYSIS, ROUTINE W REFLEX MICROSCOPIC - Abnormal; Notable for the following components:   Hgb urine dipstick SMALL (*)    Protein, ur 30 (*)    Bacteria, UA RARE (*)    All other components within normal limits  CULTURE, BLOOD (ROUTINE X 2)  CULTURE, BLOOD (ROUTINE X 2)  COMPREHENSIVE METABOLIC PANEL WITH GFR  CBC WITH  DIFFERENTIAL/PLATELET  I-STAT CG4 LACTIC ACID, ED    Notable for +COVID   Medicines ordered and prescription drug management: Meds ordered this encounter  Medications   sodium chloride  0.9 % bolus 1,000 mL   acetaminophen  (TYLENOL ) tablet 1,000 mg    -I have reviewed the patients home medicines and have made adjustments as needed  Social Determinants of Health:  Diagnosis or treatment significantly limited by social determinants of health: homelessness   Reevaluation: After  the interventions noted above, I reevaluated the patient and found that their symptoms have improved  Co morbidities that complicate the patient evaluation  Past Medical History:  Diagnosis Date   Bipolar 1 disorder ([name])    HIV infection ([name])    Schizophrenia ([name])    Shingles       Dispostion: Disposition decision including need for hospitalization was considered, and patient disposition pending at time of sign out.    Final Clinical Impression(s) / ED Diagnoses Final diagnoses:  COVID-19     This chart was dictated using voice recognition software.  Despite best efforts to proofread,  errors can occur which can change the documentation meaning.    Francesca Elsie CROME, MD 09/24/24 [phone]

## 2024-09-24 NOTE — ED Provider Notes (Signed)
 Received patient in turnover from Dr. Francesca.  Please see their note for further details of Hx, PE.  Briefly patient is a 38 y.o. male with a Fatigue .  Patient with a history of AIDS comes in with fever.  COVID-positive, extending workup to assess for possible bacterial coinfection.  Awaiting CT blood work reassess.   Patient with an AKI.  Also chest x-ray independently interpreted by me with left lower lobe infiltrate.  Will start on IV antibiotics.  Will discuss with medicine for admission.   Emil Share, DO 09/24/24 1935

## 2024-09-24 NOTE — ED Notes (Signed)
 Pt going to CT. Will start medications/ABX when he returns

## 2024-09-24 NOTE — ED Triage Notes (Signed)
 Patient has missed about 10 days of biktarvy  due to price and has nasal congestion, generalized body aches cough and feels off balance.

## 2024-09-24 NOTE — H&P (Signed)
 History and Physical    Mark [place] FMW:[id number] DOB: 10-09-1986 DOA: 09/24/2024  PCP: Pcp, No  Patient coming from: Home  I have personally briefly reviewed patient's old medical records in [facility]  Chief Complaint: Cough, fatigue  HPI: Mark [place] is a 38 y.o. male with medical history significant for HIV, paranoid schizophrenia who presented to the ED for evaluation of cough and fatigue.  Patient states that he has not been taking his Biktarvy  for about 2 weeks now.  He says he lost some of his medications and was not able to get a refill.  He says for the last 1.5 weeks he has had cough productive of clear/white sputum, body aches, fatigue, subjective fevers, chills, diaphoresis, and loose stools.  He has had progressive shortness of breath.  He has had chest discomfort worse with cough.  ED Course  Labs/Imaging on admission: I have personally reviewed following labs and imaging studies.  Initial vitals showed BP 109/64, pulse 107, RR 23, temp 100.5 F, SpO2 99% on room air.  Labs showed WBC 18.0, hemoglobin 13.0, platelets 259, sodium 132, potassium 4.2, bicarb 22, BUN 25, creatinine 1.58, serum glucose 107, LFTs within normal limits, lactic acid 2.3.  SARS-CoV-2 PCR is positive.  Influenza and RSV negative.  Blood cultures in process.  Urinalysis showed negative nitrites, negative leukocytes, 0-5 RBCs and WBCs, rare bacteria.  Portable chest x-ray showed bronchopneumonia in the left lung base.  No parapneumonic effusion.  CT abdomen/pelvis with contrast also showed left lower lobe pneumonia.  No acute localizing process in the abdomen or pelvis.  Patient was given 2 L normal saline, IV ceftriaxone  and azithromycin .  The hospitalist service was consulted for admission.  Review of Systems: All systems reviewed and are negative except as documented in history of present illness above.   Past Medical History:  Diagnosis Date   Bipolar 1 disorder ([name])    HIV  infection ([name])    Schizophrenia ([name])    Shingles     Past Surgical History:  Procedure Laterality Date   I & D EXTREMITY Right 02/22/2024   Procedure: IRRIGATION AND DEBRIDEMENT EXTREMITY;  Surgeon: Murrell Drivers, MD;  Location: [facility] OR;  Service: Orthopedics;  Laterality: Right;   IRRIGATION AND DEBRIDEMENT FOOT Left     Social History: Patient reports smoking a couple cigarettes per day.  Allergies  Allergen Reactions   Peanut Oil Hives   Peanut-Containing Drug Products Hives    History reviewed. No pertinent family history.   Prior to Admission medications   Medication Sig Start Date End Date Taking? Authorizing Provider  bictegravir-emtricitabine -tenofovir  AF (BIKTARVY ) 50-200-25 MG TABS tablet Take 1 tablet by mouth daily. 07/09/24   Calone, Gregory D, [name]  ferrous sulfate  325 (65 FE) MG tablet Take 1 tablet (325 mg total) by mouth daily with lunch. 04/24/24   Jillian Buttery, MD  nicotine  (NICODERM CQ  - DOSED IN MG/24 HOURS) 21 mg/24hr patch Place 1 patch (21 mg total) onto the skin daily. 04/01/24   Donnelly Mellow, MD  OLANZapine  (ZYPREXA ) 15 MG tablet Take 1 tablet (15 mg total) by mouth 2 (two) times daily. 07/09/24   Philemon Cordella BIRCH, [name]    Physical Exam: Vitals:   09/24/24 1700 09/24/24 1730 09/24/24 1806 09/24/24 1852  BP: (!) 101/53 (!) 93/54  (!) 104/59  Pulse: 91 90  85  Resp:  18  18  Temp:   98.6 F (37 C)   TempSrc:   Oral   SpO2: 100% 100%  100%  Weight:      Height:       Constitutional: Mark [place], NAD, calm, comfortable Eyes: EOMI, lids and conjunctivae normal ENMT: Mucous membranes are moist. Posterior pharynx clear of any exudate or lesions.Normal dentition.  Neck: normal, supple, no masses. Respiratory: Diminished breath sounds left lung base otherwise clear to auscultation bilaterally, no wheezing, no crackles. Normal respiratory effort while on room air. No accessory muscle use.  Cardiovascular: Regular rate and rhythm, no murmurs / rubs /  gallops. No extremity edema. 2+ pedal pulses. Abdomen: no tenderness, no masses palpated. Musculoskeletal: no clubbing / cyanosis. No joint deformity upper and lower extremities. Good ROM, no contractures. Normal muscle tone.  Skin: Diaphoretic.  No rashes, lesions, ulcers. No induration Neurologic: Sensation intact. Strength 5/5 in all 4.  Psychiatric: Normal judgment and insight. Alert and oriented x 3. Normal mood.   EKG: Not performed.  Assessment/Plan Principal Problem:   Sepsis due to pneumonia [facility]) Active Problems:   COVID-19 virus infection   AKI (acute kidney injury)   Paranoid schizophrenia ([name])   HIV (human immunodeficiency virus infection) ([name])   Mark [place] is a 38 y.o. male with medical history significant for HIV, paranoid schizophrenia who is admitted with sepsis due to LLL PNA and COVID-19 viral infection.  Assessment and Plan: Sepsis due to left lower lobe pneumonia and COVID-19 viral infection: Patient presented with fever, leukocytosis, tachycardia.  Imaging shows left lower lobe pneumonia.  COVID-19 PCR is positive.  He is immunosuppressed and has not been taking his Biktarvy  for about 2 weeks. - Continue IV ceftriaxone  and azithromycin  - Continue IV fluid hydration overnight - Follow blood cultures, check strep pneumonia and Legionella urine antigens - Supplemental oxygen as needed - IS, FV, Mucinex   Acute kidney injury: Creatinine 1.58 on admission compared to baseline 0.7-0.8.  Likely due to volume depletion and sepsis physiology.  Continue IV fluids and repeat labs in AM.  HIV: Has not been taking Biktarvy  for about 2 weeks.  Labs on 07/09/2024 showed CD4 218 and viral RNA 214.  Resume Biktarvy .  History of elevated blood pressure?: Patient with previous prescriptions for propranolol  and hydralazine  for blood pressure control.  These are on hold due to sepsis physiology and borderline low BPs.  Paranoid schizophrenia: Continue olanzapine .   DVT  prophylaxis: enoxaparin  (LOVENOX ) injection 40 mg Start: 09/24/24 2015 Code Status: Full code Family Communication: Discussed with patient, he has discussed with family Disposition Plan: From home, dispo pending clinical progress Consults called: None Severity of Illness: The appropriate patient status for this patient is INPATIENT. Inpatient status is judged to be reasonable and necessary in order to provide the required intensity of service to ensure the patient's safety. The patient's presenting symptoms, physical exam findings, and initial radiographic and laboratory data in the context of their chronic comorbidities is felt to place them at high risk for further clinical deterioration. Furthermore, it is not anticipated that the patient will be medically stable for discharge from the hospital within 2 midnights of admission.   * I certify that at the point of admission it is my clinical judgment that the patient will require inpatient hospital care spanning beyond 2 midnights from the point of admission due to high intensity of service, high risk for further deterioration and high frequency of surveillance required.Mark Jorie Blanch MD [facility]  If 7PM-7AM, please contact night-coverage [email or web address]  09/24/2024, 8:56 PM

## 2024-09-24 NOTE — Hospital Course (Signed)
 Mark [place] is a 38 y.o. male with medical history significant for HIV, paranoid schizophrenia who is admitted with sepsis due to LLL PNA and COVID-19 viral infection.

## 2024-09-24 NOTE — ED Triage Notes (Signed)
 Pt states that he would like his blood levels checked.  He is on Biktarvy . No distress expressed or noted on check in

## 2024-09-24 NOTE — ED Provider Triage Note (Signed)
 Emergency Medicine Provider Triage Evaluation Note  Mark [place] , a 38 y.o. male  was evaluated in triage.  Pt complains of generalized fatigue lightheadedness  Patient has not taken his Biktarvy  in the last several days. Review of Systems  Positive:  Negative:   Physical Exam  BP 109/64 (BP Location: Right Arm)   Pulse (!) 107   Temp (!) 100.5 F (38.1 C)   Resp (!) 23   Ht 5' 9 (1.753 m)   Wt 77.1 kg   SpO2 99%   BMI 25.10 kg/m  Gen:   Awake, no distress   Resp:  Normal effort  MSK:   Moves extremities without difficulty  Other:    Medical Decision Making  Medically screening exam initiated at 2:37 PM.  Appropriate orders placed.  Mark [place] was informed that the remainder of the evaluation will be completed by another provider, this initial triage assessment does not replace that evaluation, and the importance of remaining in the ED until their evaluation is complete.  Did not name a   Arloa Chroman, DEVONNA 09/25/24 2125

## 2024-09-25 DIAGNOSIS — U071 COVID-19: Secondary | ICD-10-CM

## 2024-09-25 DIAGNOSIS — N179 Acute kidney failure, unspecified: Secondary | ICD-10-CM | POA: Diagnosis not present

## 2024-09-25 DIAGNOSIS — A419 Sepsis, unspecified organism: Secondary | ICD-10-CM | POA: Diagnosis not present

## 2024-09-25 DIAGNOSIS — J189 Pneumonia, unspecified organism: Secondary | ICD-10-CM | POA: Diagnosis not present

## 2024-09-25 LAB — CBC
HCT: 29.9 % — ABNORMAL LOW (ref 39.0–52.0)
Hemoglobin: 10.3 g/dL — ABNORMAL LOW (ref 13.0–17.0)
MCH: 33.4 pg (ref 26.0–34.0)
MCHC: 34.4 g/dL (ref 30.0–36.0)
MCV: 97.1 fL (ref 80.0–100.0)
Platelets: 198 K/uL (ref 150–400)
RBC: 3.08 MIL/uL — ABNORMAL LOW (ref 4.22–5.81)
RDW: 13.6 % (ref 11.5–15.5)
WBC: 12.8 K/uL — ABNORMAL HIGH (ref 4.0–10.5)
nRBC: 0 % (ref 0.0–0.2)

## 2024-09-25 LAB — BASIC METABOLIC PANEL WITH GFR
Anion gap: 5 (ref 5–15)
BUN: 13 mg/dL (ref 6–20)
CO2: 24 mmol/L (ref 22–32)
Calcium: 8.2 mg/dL — ABNORMAL LOW (ref 8.9–10.3)
Chloride: 109 mmol/L (ref 98–111)
Creatinine, Ser: 0.99 mg/dL (ref 0.61–1.24)
GFR, Estimated: >60 mL/min (ref >60)
Glucose, Bld: 97 mg/dL (ref 70–99)
Potassium: 3.6 mmol/L (ref 3.5–5.1)
Sodium: 138 mmol/L (ref 135–145)

## 2024-09-25 LAB — LACTIC ACID, PLASMA: Lactic Acid, Venous: 1.7 mmol/L (ref 0.5–1.9)

## 2024-09-25 MED ORDER — SODIUM CHLORIDE 0.9 % IV BOLUS
1000.0000 mL | Freq: Once | INTRAVENOUS | Status: AC
  Administered 2024-09-25: 1000 mL via INTRAVENOUS

## 2024-09-25 MED ORDER — INFLUENZA VIRUS VACC SPLIT PF (FLUZONE) 0.5 ML IM SUSY: 0.5000 mL | PREFILLED_SYRINGE | INTRAMUSCULAR | Status: DC | PRN

## 2024-09-25 MED ORDER — ENSURE PLUS HIGH PROTEIN PO LIQD
237.0000 mL | Freq: Two times a day (BID) | ORAL | Status: DC
  Administered 2024-09-26 (×2): 237 mL via ORAL

## 2024-09-25 NOTE — Discharge Instructions (Signed)
 Food  Agency Name: [name] Support [facility] Nutrition Program Address: [address] [place] [address] Phone: [phone] Website: SmoothHits.com.cy Service(s) Offered: Provides grocery assistance to income eligible  individuals and their families. Hours are MondayFriday 10am-2pm by appointment only.  Agency Name: [facility] Address: [address] [place] [address] Phone: [phone] Website: [email or web address]/ Service(s) Offered: Through the Emergency Assistance Program (EAP),  financial assistance is provided for costs necessary to  maintain housing - utility, water , rent, and/or mortgage  payments. [facility] residents specifically. Call to  schedule an appointment.  Agency Name: [facility] Address: [address] [place], [place] [address] Phone: [phone] April 02, 2018 8 Website: [email or web address] Service(s) Offered: Food assistance hours are Tues. & Thurs. 9am-12pm.  Please bring your driver's license and social security  cards for all members of your household.  Transportation  Agency Name: [facility] Address: [address] [place]  [place] [address] Phone: [phone] April 02, 2018 12 Website: [email or web address]://www.[place].gov Service(s) Offered: Public bus transportation.  Agency Name: [employer]  ([employer]) Address: [address] [place] [address] Phone: [phone] Website: DebtSupply.pl Service(s) Offered: Public bus transportation between listed counties:  [place], [place], [place], [place], [place],  [place], [place], [place] and [place].   Agency Name: Scientist, clinical (histocompatibility and immunogenetics)  [facility]) Address: [address]  [place] [address] Phone: [phone] Website: [email or web address]://www.[email or web address].gov Service(s) Offered: Public  transportation specifically for individuals with  disabilities that are unable to utilize the general bus  routes.

## 2024-09-25 NOTE — Progress Notes (Signed)
 [facility] PROGRESS NOTE    Progress Note  Mark [place]  FMW:[id number] DOB: August 20, 1986 DOA: 09/24/2024 PCP: Pcp, No     Brief Narrative:   Mark [place] is an 38 y.o. male past medical history of HIV paranoid schizophrenia comes to the ED for cough and fatigue, in the ED was found to be febrile satting 99% on room air leukocytosis of 18, viral panel was negative, except for SARS-CoV-2 PCR which was positive blood cultures were obtained chest x-ray showed left lung base infiltrate and no parapneumonic effusion.  CT scan of the abdomen pelvis showed left lower lobe infiltrate. Assessment/Plan:   Sepsis due to pneumonia Mark G[name]) which is superimposed on COVID-19 viral infection: Started empirically on IV Rocephin  and azithromycin  and fluid resuscitation. Blood cultures were obtained. Pressure remains borderline. On admission creatinine was 1.5, urine output has improved creatinine has improved to less than 1. Continue sensi spirometry and flutter valve.  Acute kidney injury: Likely prerenal with a baseline creatinine of less than 1 admission 1.5 started on IV fluids creatinine has returned to baseline.  HIV: Continue with active RA.  Essential hypertension: At home he was on propranolol  and hydralazine . This have been held as his blood pressure has been borderline.  Paranoid schizophrenia: Continue elanzepine.  COVID-19 virus infection: He is satting greater than 90% on room air. Not a candidate for treatment at this time. He seems to be responding to empiric antibiotics.  DVT prophylaxis: lovenox  Family Communication:none Status is: Inpatient Remains inpatient appropriate because: Sepsis due to pneumonia    Code Status:     Code Status Orders  (From admission, onward)           Start     Ordered   09/24/24 2003  Full code  Continuous       Question:  By:  Answer:  Consent: discussion documented in EHR   09/24/24 2003           Code Status History      Date Active Date Inactive Code Status Order ID Comments User Context   04/05/2024 2124 04/24/2024 1743 Full Code [id number]  Franky Redia SAILOR, MD Inpatient   03/14/2024 1441 03/31/2024 1451 Full Code [id number]  Mardy Legacy, NP Inpatient   03/12/2024 2148 03/14/2024 1433 Full Code [id number]  Towana Ozell BROCKS, MD ED   02/22/2024 0900 02/24/2024 1210 Full Code [id number]  Moody Alto, MD ED   10/02/2022 0004 10/02/2022 0440 Full Code [id number]  Jarold Olam HERO, PA-C ED   03/29/2020 2012 04/08/2020 2228 Full Code [id number]  Sebastian Quale, NP Inpatient   03/25/2020 0412 03/29/2020 1941 Full Code [id number]  Muthersbaugh, Chiquita RIGGERS ED         IV Access:   Peripheral IV   Procedures and diagnostic studies:   CT ABDOMEN PELVIS W CONTRAST Result Date: 09/24/2024 CLINICAL DATA:  Left lower quadrant pain EXAM: CT ABDOMEN AND PELVIS WITH CONTRAST TECHNIQUE: Multidetector CT imaging of the abdomen and pelvis was performed using the standard protocol following bolus administration of intravenous contrast. RADIATION DOSE REDUCTION: This exam was performed according to the departmental dose-optimization program which includes automated exposure control, adjustment of the mA and/or kV according to patient size and/or use of iterative reconstruction technique. CONTRAST:  75mL OMNIPAQUE  IOHEXOL  350 MG/ML SOLN COMPARISON:  CT chest abdomen and pelvis 04/17/2021. FINDINGS: Lower chest: There is airspace consolidation throughout the visualized left lower lobe compatible with pneumonia. There some patchy ground-glass opacities in the right lung base. Hepatobiliary: No focal liver  abnormality is seen. No gallstones, gallbladder wall thickening, or biliary dilatation. Pancreas: Unremarkable. No pancreatic ductal dilatation or surrounding inflammatory changes. Spleen: Normal in size without focal abnormality. Adrenals/Urinary Tract: Adrenal glands are unremarkable. Kidneys are normal, without renal calculi, focal  lesion, or hydronephrosis. Bladder is unremarkable. Stomach/Bowel: Stomach is within normal limits. Appendix appears normal. No evidence of bowel wall thickening, distention, or inflammatory changes. Vascular/Lymphatic: No significant vascular findings are present. No enlarged abdominal or pelvic lymph nodes. Reproductive: Prostate is unremarkable. Other: No abdominal wall hernia or abnormality. No abdominopelvic ascites. Musculoskeletal: No acute or significant osseous findings. IMPRESSION: 1. Left lower lobe pneumonia. 2. No acute localizing process in the abdomen or pelvis. Electronically Signed   By: Greig Pique M.D.   On: 09/24/2024 18:36   DG Chest Portable 1 View Result Date: 09/24/2024 CLINICAL DATA:  cough EXAM: PORTABLE CHEST - 1 VIEW COMPARISON:  04/06/2024 FINDINGS: Patchy airspace opacities in the left lung base. No pleural effusion or pneumothorax. No cardiomegaly.No acute fracture or destructive lesion. IMPRESSION: Bronchopneumonia in the left lung base. No parapneumonic effusion. A 2 view chest radiograph in 6-12 weeks is recommended to document resolution once treatment is complete. Electronically Signed   By: Rogelia Myers M.D.   On: 09/24/2024 16:44     Medical Consultants:   None.   Subjective:    Mark G[place]ay relates he feels much better than on admission.  Relates appetite has returned  Objective:    Vitals:   09/25/24 0245 09/25/24 0340 09/25/24 0410 09/25/24 0440  BP: (!) 84/59 (!) 93/57 (!) 82/55 (!) 96/49  Pulse: (!) 59 72  75  Resp: 17 16  15   Temp:   97.7 F (36.5 C)   TempSrc:   Oral   SpO2: 100% 100%  100%  Weight:      Height:       SpO2: 100 %   Intake/Output Summary (Last 24 hours) at 09/25/2024 0725 Last data filed at 09/25/2024 0400 Gross per 24 hour  Intake 3285.35 ml  Output 850 ml  Net 2435.35 ml   Filed Weights   09/24/24 1414 09/24/24 2130  Weight: 77.1 kg 74.9 kg    Exam: General exam: In no acute distress. Respiratory  system: Good air movement and clear to auscultation. Cardiovascular system: S1 & S2 heard, RRR. No JVD. Gastrointestinal system: Abdomen is nondistended, soft and nontender.  Extremities: No pedal edema. Skin: No rashes, lesions or ulcers Psychiatry: Judgement and insight appear normal. Mood & affect appropriate.    Data Reviewed:    Labs: Basic Metabolic Panel: Recent Labs  Lab 09/24/24 1632 09/25/24 0458  NA 132* 138  K 4.2 3.6  CL 98 109  CO2 22 24  GLUCOSE 107* 97  BUN 25* 13  CREATININE 1.58* 0.99  CALCIUM  8.6* 8.2*   GFR Estimated Creatinine Clearance: 102.2 mL/min (by C-G formula based on SCr of 0.99 mg/dL). Liver Function Tests: Recent Labs  Lab 09/24/24 1632  AST 35  ALT 19  ALKPHOS 77  BILITOT 0.6  PROT 8.0  ALBUMIN  3.6   No results for input(s): LIPASE, AMYLASE in the last 168 hours. No results for input(s): AMMONIA in the last 168 hours. Coagulation profile No results for input(s): INR, PROTIME in the last 168 hours. COVID-19 Labs  No results for input(s): DDIMER, FERRITIN, LDH, CRP in the last 72 hours.  Lab Results  Component Value Date   SARSCOV2NAA POSITIVE (A) 09/24/2024   SARSCOV2NAA NEGATIVE 04/13/2023   SARSCOV2NAA NEGATIVE  12/01/2020   SARSCOV2NAA NEGATIVE 03/25/2020    CBC: Recent Labs  Lab 09/24/24 1632 09/25/24 0458  WBC 18.0* 12.8*  NEUTROABS 14.9*  --   HGB 13.0 10.3*  HCT 38.5* 29.9*  MCV 97.2 97.1  PLT 259 198   Cardiac Enzymes: No results for input(s): CKTOTAL, CKMB, CKMBINDEX, TROPONINI in the last 168 hours. BNP (last 3 results) No results for input(s): PROBNP in the last 8760 hours. CBG: No results for input(s): GLUCAP in the last 168 hours. D-Dimer: No results for input(s): DDIMER in the last 72 hours. Hgb A1c: No results for input(s): HGBA1C in the last 72 hours. Lipid Profile: No results for input(s): CHOL, HDL, LDLCALC, TRIG, CHOLHDL, LDLDIRECT in the last 72  hours. Thyroid  function studies: No results for input(s): TSH, T4TOTAL, T3FREE, THYROIDAB in the last 72 hours.  Invalid input(s): FREET3 Anemia work up: No results for input(s): VITAMINB12, FOLATE, FERRITIN, TIBC, IRON, RETICCTPCT in the last 72 hours. Sepsis Labs: Recent Labs  Lab 09/24/24 1632 09/24/24 1643 09/25/24 0032 09/25/24 0458  WBC 18.0*  --   --  12.8*  LATICACIDVEN  --  2.3* 1.7  --    Microbiology Recent Results (from the past 240 hours)  Resp panel by RT-PCR (RSV, Flu A&B, Covid) Anterior Nasal Swab     Status: Abnormal   Collection Time: 09/24/24  2:18 PM   Specimen: Anterior Nasal Swab  Result Value Ref Range Status   SARS Coronavirus 2 by RT PCR POSITIVE (A) NEGATIVE Final   Influenza A by PCR NEGATIVE NEGATIVE Final   Influenza B by PCR NEGATIVE NEGATIVE Final    Comment: (NOTE) The Xpert Xpress SARS-CoV-2/FLU/RSV plus assay is intended as an aid in the diagnosis of influenza from Nasopharyngeal swab specimens and should not be used as a sole basis for treatment. Nasal washings and aspirates are unacceptable for Xpert Xpress SARS-CoV-2/FLU/RSV testing.  Fact Sheet for Patients: BloggerCourse.com  Fact Sheet for Healthcare Providers: SeriousBroker.it  This test is not yet approved or cleared by the [place]  FDA and has been authorized for detection and/or diagnosis of SARS-CoV-2 by FDA under an Emergency Use Authorization (EUA). This EUA will remain in effect (meaning this test can be used) for the duration of the COVID-19 declaration under Section 564(b)(1) of the Act, 21 U.S.C. section 360bbb-3(b)(1), unless the authorization is terminated or revoked.     Resp Syncytial Virus by PCR NEGATIVE NEGATIVE Final    Comment: (NOTE) Fact Sheet for Patients: BloggerCourse.com  Fact Sheet for Healthcare  Providers: SeriousBroker.it  This test is not yet approved or cleared by the [place]  FDA and has been authorized for detection and/or diagnosis of SARS-CoV-2 by FDA under an Emergency Use Authorization (EUA). This EUA will remain in effect (meaning this test can be used) for the duration of the COVID-19 declaration under Section 564(b)(1) of the Act, 21 U.S.C. section 360bbb-3(b)(1), unless the authorization is terminated or revoked.  Performed at [facility], [address], [place], [place] [address]   Culture, blood (routine x 2)     Status: None (Preliminary result)   Collection Time: 09/24/24  4:32 PM   Specimen: BLOOD LEFT ARM  Result Value Ref Range Status   Specimen Description BLOOD LEFT ARM  Final   Special Requests   Final    BOTTLES DRAWN AEROBIC AND ANAEROBIC Blood Culture adequate volume   Culture   Final    NO GROWTH < 12 HOURS Performed at [facility],  [address] [place], [place] [address]    Report Status PENDING  Incomplete  Culture, blood (routine x 2)     Status: None (Preliminary result)   Collection Time: 09/24/24  4:32 PM   Specimen: BLOOD RIGHT ARM  Result Value Ref Range Status   Specimen Description BLOOD RIGHT ARM  Final   Special Requests   Final    BOTTLES DRAWN AEROBIC AND ANAEROBIC Blood Culture adequate volume   Culture   Final    NO GROWTH < 12 HOURS Performed at [facility], [address] [place], [place] [address]    Report Status PENDING  Incomplete     Medications:    bictegravir-emtricitabine -tenofovir  AF  1 tablet Oral Daily   enoxaparin  (LOVENOX ) injection  40 mg Subcutaneous Q24H   feeding supplement  237 mL Oral BID BM   guaiFENesin   600 mg Oral BID   OLANZapine   15 mg Oral BID   sodium chloride  flush  3 mL Intravenous Q12H   Continuous Infusions:  azithromycin      cefTRIAXone  (ROCEPHIN )  IV     lactated ringers  150 mL/hr (09/25/24 0646)      LOS: 1 day    Mark [place]  [facility]  09/25/2024, 7:25 AM

## 2024-09-25 NOTE — Plan of Care (Signed)
  Problem: Fluid Volume: Goal: Hemodynamic stability will improve Outcome: Progressing   Problem: Clinical Measurements: Goal: Diagnostic test results will improve Outcome: Progressing Goal: Signs and symptoms of infection will decrease Outcome: Progressing   Problem: Respiratory: Goal: Ability to maintain adequate ventilation will improve Outcome: Progressing   Problem: Education: Goal: Knowledge of General Education information will improve Description: Including pain rating scale, medication(s)/side effects and non-pharmacologic comfort measures Outcome: Progressing   Problem: Health Behavior/Discharge Planning: Goal: Ability to manage health-related needs will improve Outcome: Progressing   Problem: Clinical Measurements: Goal: Ability to maintain clinical measurements within normal limits will improve Outcome: Progressing Goal: Will remain free from infection Outcome: Progressing Goal: Diagnostic test results will improve Outcome: Progressing Goal: Respiratory complications will improve Outcome: Progressing Goal: Cardiovascular complication will be avoided Outcome: Progressing

## 2024-09-25 NOTE — TOC CM/SW Note (Signed)
 CSW added food resources and transportation resources to patients AVS.

## 2024-09-26 DIAGNOSIS — J189 Pneumonia, unspecified organism: Secondary | ICD-10-CM | POA: Diagnosis not present

## 2024-09-26 DIAGNOSIS — U071 COVID-19: Secondary | ICD-10-CM | POA: Diagnosis not present

## 2024-09-26 DIAGNOSIS — N179 Acute kidney failure, unspecified: Secondary | ICD-10-CM | POA: Diagnosis not present

## 2024-09-26 DIAGNOSIS — A419 Sepsis, unspecified organism: Secondary | ICD-10-CM | POA: Diagnosis not present

## 2024-09-26 NOTE — Progress Notes (Signed)
 [facility] PROGRESS NOTE    Progress Note  Mark Galloway  FMW:[id number] DOB: 27-Oct-1986 DOA: 09/24/2024 PCP: Pcp, No     Brief Narrative:   Mark Galloway is an 38 y.o. male past medical history of HIV paranoid schizophrenia comes to the ED for cough and fatigue, in the ED was found to be febrile satting 99% on room air leukocytosis of 18, viral panel was negative, except for SARS-CoV-2 PCR which was positive blood cultures were obtained chest x-ray showed left lung base infiltrate and no parapneumonic effusion.  CT scan of the abdomen pelvis showed left lower lobe infiltrate. Assessment/Plan:   Sepsis due to pneumonia [facility]) which is superimposed on COVID-19 viral infection: Continue empiric IV Rocephin  and azithromycin . Blood cultures were obtained. Blood pressure remains borderline, Mark Galloway continues to spike fevers On admission creatinine was 1.5, urine output has improved.   Acute kidney injury: Likely prerenal with a baseline creatinine of less than 1 admission 1.5 started on IV fluids creatinine has returned to baseline.  HIV: Continue with active RA.  Essential hypertension: At home Mark Galloway was on propranolol  and hydralazine . This have been held as his blood pressure has been borderline.  Paranoid schizophrenia: Continue Olanzepine.  COVID-19 virus infection: Mark Galloway is satting greater than 90% on room air. Not a candidate for treatment at this time. Mark Galloway seems to be responding to empiric antibiotics.  DVT prophylaxis: lovenox  Family Communication:none Status is: Inpatient Remains inpatient appropriate because: Sepsis due to pneumonia    Code Status:     Code Status Orders  (From admission, onward)           Start     Ordered   09/24/24 2003  Full code  Continuous       Question:  By:  Answer:  Consent: discussion documented in EHR   09/24/24 2003           Code Status History     Date Active Date Inactive Code Status Order ID Comments User Context    04/05/2024 2124 04/24/2024 1743 Full Code [id number]  Franky Redia SAILOR, MD Inpatient   03/14/2024 1441 03/31/2024 1451 Full Code [id number]  Mardy Legacy, NP Inpatient   03/12/2024 2148 03/14/2024 1433 Full Code [id number]  Towana Ozell BROCKS, MD ED   02/22/2024 0900 02/24/2024 1210 Full Code [id number]  Moody Alto, MD ED   10/02/2022 0004 10/02/2022 0440 Full Code [id number]  Jarold Olam HERO, PA-C ED   03/29/2020 2012 04/08/2020 2228 Full Code [id number]  Sebastian Quale, NP Inpatient   03/25/2020 0412 03/29/2020 1941 Full Code [id number]  Muthersbaugh, Chiquita RIGGERS ED         IV Access:   Peripheral IV   Procedures and diagnostic studies:   CT ABDOMEN PELVIS W CONTRAST Result Date: 09/24/2024 CLINICAL DATA:  Left lower quadrant pain EXAM: CT ABDOMEN AND PELVIS WITH CONTRAST TECHNIQUE: Multidetector CT imaging of the abdomen and pelvis was performed using the standard protocol following bolus administration of intravenous contrast. RADIATION DOSE REDUCTION: This exam was performed according to the departmental dose-optimization program which includes automated exposure control, adjustment of the mA and/or kV according to patient size and/or use of iterative reconstruction technique. CONTRAST:  75mL OMNIPAQUE  IOHEXOL  350 MG/ML SOLN COMPARISON:  CT chest abdomen and pelvis 04/17/2021. FINDINGS: Lower chest: There is airspace consolidation throughout the visualized left lower lobe compatible with pneumonia. There some patchy ground-glass opacities in the right lung base. Hepatobiliary: No focal liver abnormality is seen. No gallstones, gallbladder wall thickening, or biliary  dilatation. Pancreas: Unremarkable. No pancreatic ductal dilatation or surrounding inflammatory changes. Spleen: Normal in size without focal abnormality. Adrenals/Urinary Tract: Adrenal glands are unremarkable. Kidneys are normal, without renal calculi, focal lesion, or hydronephrosis. Bladder is unremarkable. Stomach/Bowel: Stomach is  within normal limits. Appendix appears normal. No evidence of bowel wall thickening, distention, or inflammatory changes. Vascular/Lymphatic: No significant vascular findings are present. No enlarged abdominal or pelvic lymph nodes. Reproductive: Prostate is unremarkable. Other: No abdominal wall hernia or abnormality. No abdominopelvic ascites. Musculoskeletal: No acute or significant osseous findings. IMPRESSION: 1. Left lower lobe pneumonia. 2. No acute localizing process in the abdomen or pelvis. Electronically Signed   By: Greig Pique M.D.   On: 09/24/2024 18:36   DG Chest Portable 1 View Result Date: 09/24/2024 CLINICAL DATA:  cough EXAM: PORTABLE CHEST - 1 VIEW COMPARISON:  04/06/2024 FINDINGS: Patchy airspace opacities in the left lung base. No pleural effusion or pneumothorax. No cardiomegaly.No acute fracture or destructive lesion. IMPRESSION: Bronchopneumonia in the left lung base. No parapneumonic effusion. A 2 view chest radiograph in 6-12 weeks is recommended to document resolution once treatment is complete. Electronically Signed   By: Rogelia Myers M.D.   On: 09/24/2024 16:44     Medical Consultants:   None.   Subjective:    Mark Galloway relates his appetite has improved.  Objective:    Vitals:   09/26/24 0100 09/26/24 0300 09/26/24 0400 09/26/24 0448  BP:  105/60  (!) 104/56  Pulse: 80 65  75  Resp:  (!) 22 17 18   Temp:    99.6 F (37.6 C)  TempSrc:    Oral  SpO2: 100% 95%  98%  Weight:      Height:       SpO2: 98 %   Intake/Output Summary (Last 24 hours) at 09/26/2024 0737 Last data filed at 09/26/2024 0500 Gross per 24 hour  Intake 720 ml  Output 4000 ml  Net -3280 ml   Filed Weights   09/24/24 1414 09/24/24 2130  Weight: 77.1 kg 74.9 kg    Exam: General exam: In no acute distress. Respiratory system: Good air movement and clear to auscultation. Cardiovascular system: S1 & S2 heard, RRR. No JVD. Gastrointestinal system: Abdomen is nondistended,  soft and nontender.  Extremities: No pedal edema. Skin: No rashes, lesions or ulcers Psychiatry: Judgement and insight appear normal. Mood & affect appropriate.  Data Reviewed:    Labs: Basic Metabolic Panel: Recent Labs  Lab 09/24/24 1632 09/25/24 0458  NA 132* 138  K 4.2 3.6  CL 98 109  CO2 22 24  GLUCOSE 107* 97  BUN 25* 13  CREATININE 1.58* 0.99  CALCIUM  8.6* 8.2*   GFR Estimated Creatinine Clearance: 102.2 mL/min (by C-G formula based on SCr of 0.99 mg/dL). Liver Function Tests: Recent Labs  Lab 09/24/24 1632  AST 35  ALT 19  ALKPHOS 77  BILITOT 0.6  PROT 8.0  ALBUMIN  3.6   No results for input(s): LIPASE, AMYLASE in the last 168 hours. No results for input(s): AMMONIA in the last 168 hours. Coagulation profile No results for input(s): INR, PROTIME in the last 168 hours. COVID-19 Labs  No results for input(s): DDIMER, FERRITIN, LDH, CRP in the last 72 hours.  Lab Results  Component Value Date   SARSCOV2NAA POSITIVE (A) 09/24/2024   SARSCOV2NAA NEGATIVE 04/13/2023   SARSCOV2NAA NEGATIVE 12/01/2020   SARSCOV2NAA NEGATIVE 03/25/2020    CBC: Recent Labs  Lab 09/24/24 1632 09/25/24 0458  WBC 18.0* 12.8*  NEUTROABS 14.9*  --   HGB 13.0 10.3*  HCT 38.5* 29.9*  MCV 97.2 97.1  PLT 259 198   Cardiac Enzymes: No results for input(s): CKTOTAL, CKMB, CKMBINDEX, TROPONINI in the last 168 hours. BNP (last 3 results) No results for input(s): PROBNP in the last 8760 hours. CBG: No results for input(s): GLUCAP in the last 168 hours. D-Dimer: No results for input(s): DDIMER in the last 72 hours. Hgb A1c: No results for input(s): HGBA1C in the last 72 hours. Lipid Profile: No results for input(s): CHOL, HDL, LDLCALC, TRIG, CHOLHDL, LDLDIRECT in the last 72 hours. Thyroid  function studies: No results for input(s): TSH, T4TOTAL, T3FREE, THYROIDAB in the last 72 hours.  Invalid input(s):  FREET3 Anemia work up: No results for input(s): VITAMINB12, FOLATE, FERRITIN, TIBC, IRON, RETICCTPCT in the last 72 hours. Sepsis Labs: Recent Labs  Lab 09/24/24 1632 09/24/24 1643 09/25/24 0032 09/25/24 0458  WBC 18.0*  --   --  12.8*  LATICACIDVEN  --  2.3* 1.7  --    Microbiology Recent Results (from the past 240 hours)  Resp panel by RT-PCR (RSV, Flu A&B, Covid) Anterior Nasal Swab     Status: Abnormal   Collection Time: 09/24/24  2:18 PM   Specimen: Anterior Nasal Swab  Result Value Ref Range Status   SARS Coronavirus 2 by RT PCR POSITIVE (A) NEGATIVE Final   Influenza A by PCR NEGATIVE NEGATIVE Final   Influenza B by PCR NEGATIVE NEGATIVE Final    Comment: (NOTE) The Xpert Xpress SARS-CoV-2/FLU/RSV plus assay is intended as an aid in the diagnosis of influenza from Nasopharyngeal swab specimens and should not be used as a sole basis for treatment. Nasal washings and aspirates are unacceptable for Xpert Xpress SARS-CoV-2/FLU/RSV testing.  Fact Sheet for Patients: BloggerCourse.com  Fact Sheet for Healthcare Providers: SeriousBroker.it  This test is not yet approved or cleared by the [place]  FDA and has been authorized for detection and/or diagnosis of SARS-CoV-2 by FDA under an Emergency Use Authorization (EUA). This EUA will remain in effect (meaning this test can be used) for the duration of the COVID-19 declaration under Section 564(b)(1) of the Act, 21 U.S.C. section 360bbb-3(b)(1), unless the authorization is terminated or revoked.     Resp Syncytial Virus by PCR NEGATIVE NEGATIVE Final    Comment: (NOTE) Fact Sheet for Patients: BloggerCourse.com  Fact Sheet for Healthcare Providers: SeriousBroker.it  This test is not yet approved or cleared by the [place]  FDA and has been authorized for detection and/or diagnosis of SARS-CoV-2  by FDA under an Emergency Use Authorization (EUA). This EUA will remain in effect (meaning this test can be used) for the duration of the COVID-19 declaration under Section 564(b)(1) of the Act, 21 U.S.C. section 360bbb-3(b)(1), unless the authorization is terminated or revoked.  Performed at [facility], [address], [place], [place] [address]   Culture, blood (routine x 2)     Status: None (Preliminary result)   Collection Time: 09/24/24  4:32 PM   Specimen: BLOOD LEFT ARM  Result Value Ref Range Status   Specimen Description BLOOD LEFT ARM  Final   Special Requests   Final    BOTTLES DRAWN AEROBIC AND ANAEROBIC Blood Culture adequate volume   Culture   Final    NO GROWTH < 12 HOURS Performed at [facility], [address], [place], [place] [address]    Report Status PENDING  Incomplete  Culture, blood (routine x 2)  Status: None (Preliminary result)   Collection Time: 09/24/24  4:32 PM   Specimen: BLOOD RIGHT ARM  Result Value Ref Range Status   Specimen Description BLOOD RIGHT ARM  Final   Special Requests   Final    BOTTLES DRAWN AEROBIC AND ANAEROBIC Blood Culture adequate volume   Culture   Final    NO GROWTH < 12 HOURS Performed at [facility], [address] [place], [place] [address]    Report Status PENDING  Incomplete     Medications:    bictegravir-emtricitabine -tenofovir  AF  1 tablet Oral Daily   enoxaparin  (LOVENOX ) injection  40 mg Subcutaneous Q24H   feeding supplement  237 mL Oral BID BM   guaiFENesin   600 mg Oral BID   OLANZapine   15 mg Oral BID   sodium chloride  flush  3 mL Intravenous Q12H   Continuous Infusions:  azithromycin  500 mg (09/25/24 2027)   cefTRIAXone  (ROCEPHIN )  IV 2 g (09/25/24 1835)      LOS: 2 days   Erle Odell Castor  [facility]  09/26/2024, 7:37 AM

## 2024-09-26 NOTE — Plan of Care (Signed)
  Problem: Clinical Measurements: Goal: Diagnostic test results will improve Outcome: Progressing Goal: Signs and symptoms of infection will decrease Outcome: Progressing   Problem: Respiratory: Goal: Ability to maintain adequate ventilation will improve Outcome: Progressing   Problem: Education: Goal: Knowledge of General Education information will improve Description: Including pain rating scale, medication(s)/side effects and non-pharmacologic comfort measures Outcome: Progressing   Problem: Health Behavior/Discharge Planning: Goal: Ability to manage health-related needs will improve Outcome: Progressing   Problem: Activity: Goal: Risk for activity intolerance will decrease Outcome: Progressing   Problem: Nutrition: Goal: Adequate nutrition will be maintained Outcome: Progressing   Problem: Coping: Goal: Level of anxiety will decrease Outcome: Progressing

## 2024-09-27 DIAGNOSIS — J189 Pneumonia, unspecified organism: Secondary | ICD-10-CM | POA: Diagnosis not present

## 2024-09-27 DIAGNOSIS — A419 Sepsis, unspecified organism: Secondary | ICD-10-CM | POA: Diagnosis not present

## 2024-09-27 MED ORDER — AMOXICILLIN-POT CLAVULANATE 875-125 MG PO TABS
1.0000 | ORAL_TABLET | Freq: Two times a day (BID) | ORAL | Status: DC
  Administered 2024-09-27: 1 via ORAL
  Filled 2024-09-27: qty 1

## 2024-09-27 MED ORDER — AZITHROMYCIN 500 MG PO TABS: 500.0000 mg | ORAL_TABLET | Freq: Every day | ORAL | 0 refills | Status: AC

## 2024-09-27 MED ORDER — AMOXICILLIN-POT CLAVULANATE 875-125 MG PO TABS: 1.0000 | ORAL_TABLET | Freq: Two times a day (BID) | ORAL | 0 refills | Status: AC

## 2024-09-27 NOTE — Discharge Summary (Signed)
 Physician Discharge Summary  Mark Galloway FMW:[id number] DOB: 08/11/1986 DOA: 09/24/2024  PCP: Pcp, No  Admit date: 09/24/2024 Discharge date: 09/27/2024  Admitted From: Home Disposition:  Home  Recommendations for Outpatient Follow-up:  Follow up with PCP in 1-2 weeks Please obtain BMP/CBC in one week   Home Health:No Equipment/Devices:None  Discharge Condition:Stable CODE STATUS:Full Diet recommendation: Heart Healthy  Brief/Interim Summary: 38 y.o. male past medical history of HIV paranoid schizophrenia comes to the ED for cough and fatigue, in the ED was found to be febrile satting 99% on room air leukocytosis of 18, viral panel was negative, except for SARS-CoV-2 PCR which was positive blood cultures were obtained chest x-ray showed left lung base infiltrate and no parapneumonic effusion.  CT scan of the abdomen pelvis showed left lower lobe infiltrate.   Discharge Diagnoses:  Principal Problem:   Sepsis due to pneumonia [facility]) Active Problems:   COVID-19 virus infection   AKI (acute kidney injury)   Paranoid schizophrenia ([name])   HIV (human immunodeficiency virus infection) ([name])  Sepsis due to multifocal pneumonia superimposed on COVID-19 viral infection: He was started on Rocephin  and azithromycin  blood cultures remain negative. He did rest. Transition to oral Augmentin  and azithromycin  and will continue this for 5 days a new patient.  Acute kidney injury: Likely prerenal azotemia creatinine returned to baseline with resuscitation.  HIV: Continue current meds no changes made.  Essential hypertension: Blood pressures is improved, resume home regimen as an outpatient.  Paranoid schizophrenia: Continue olanzapine .  COVID-19 viral infection: Noted remained saturating greater than 90% on room air.  Discharge Instructions  Discharge Instructions     Diet - low sodium heart healthy   Complete by: As directed    Increase activity slowly   Complete by: As  directed       Allergies as of 09/27/2024       Reactions   Peanut Oil Hives   Peanut-containing Drug Products Hives        Medication List     TAKE these medications    amoxicillin -clavulanate 875-125 MG tablet Commonly known as: AUGMENTIN  Take 1 tablet by mouth every 12 (twelve) hours for 3 days.   azithromycin  500 MG tablet Commonly known as: Zithromax  Take 1 tablet (500 mg total) by mouth daily for 3 days.   Biktarvy  50-200-25 MG Tabs tablet Generic drug: bictegravir-emtricitabine -tenofovir  AF Take 1 tablet by mouth daily.   FeroSul 325 (65 Fe) MG tablet Generic drug: ferrous sulfate  Take 1 tablet (325 mg total) by mouth daily with lunch.   hydrALAZINE  50 MG tablet Commonly known as: APRESOLINE  Take 50 mg by mouth daily.   OLANZapine  15 MG tablet Commonly known as: ZYPREXA  Take 1 tablet (15 mg total) by mouth 2 (two) times daily.   propranolol  10 MG tablet Commonly known as: INDERAL  Take 10 mg by mouth daily.        Allergies  Allergen Reactions   Peanut Oil Hives   Peanut-Containing Drug Products Hives    Consultations: None   Procedures/Studies: CT ABDOMEN PELVIS W CONTRAST Result Date: 09/24/2024 CLINICAL DATA:  Left lower quadrant pain EXAM: CT ABDOMEN AND PELVIS WITH CONTRAST TECHNIQUE: Multidetector CT imaging of the abdomen and pelvis was performed using the standard protocol following bolus administration of intravenous contrast. RADIATION DOSE REDUCTION: This exam was performed according to the departmental dose-optimization program which includes automated exposure control, adjustment of the mA and/or kV according to patient size and/or use of iterative reconstruction technique. CONTRAST:  75mL OMNIPAQUE  IOHEXOL   350 MG/ML SOLN COMPARISON:  CT chest abdomen and pelvis 04/17/2021. FINDINGS: Lower chest: There is airspace consolidation throughout the visualized left lower lobe compatible with pneumonia. There some patchy ground-glass opacities  in the right lung base. Hepatobiliary: No focal liver abnormality is seen. No gallstones, gallbladder wall thickening, or biliary dilatation. Pancreas: Unremarkable. No pancreatic ductal dilatation or surrounding inflammatory changes. Spleen: Normal in size without focal abnormality. Adrenals/Urinary Tract: Adrenal glands are unremarkable. Kidneys are normal, without renal calculi, focal lesion, or hydronephrosis. Bladder is unremarkable. Stomach/Bowel: Stomach is within normal limits. Appendix appears normal. No evidence of bowel wall thickening, distention, or inflammatory changes. Vascular/Lymphatic: No significant vascular findings are present. No enlarged abdominal or pelvic lymph nodes. Reproductive: Prostate is unremarkable. Other: No abdominal wall hernia or abnormality. No abdominopelvic ascites. Musculoskeletal: No acute or significant osseous findings. IMPRESSION: 1. Left lower lobe pneumonia. 2. No acute localizing process in the abdomen or pelvis. Electronically Signed   By: Greig Pique M.D.   On: 09/24/2024 18:36   DG Chest Portable 1 View Result Date: 09/24/2024 CLINICAL DATA:  cough EXAM: PORTABLE CHEST - 1 VIEW COMPARISON:  04/06/2024 FINDINGS: Patchy airspace opacities in the left lung base. No pleural effusion or pneumothorax. No cardiomegaly.No acute fracture or destructive lesion. IMPRESSION: Bronchopneumonia in the left lung base. No parapneumonic effusion. A 2 view chest radiograph in 6-12 weeks is recommended to document resolution once treatment is complete. Electronically Signed   By: Rogelia Myers M.D.   On: 09/24/2024 16:44   DG Hand Complete Right Result Date: 09/21/2024 CLINICAL DATA:  38 year old male with right 5th finger pain. History of osteomyelitis. EXAM: RIGHT HAND - COMPLETE 3+ VIEW COMPARISON:  Right 5th finger series 04/05/2024. FINDINGS: Three views, the right 5th finger is completely flexed on all views. No fracture or dislocation. But there does appear to be  osteolysis of the tuft of the 5th distal phalanx compared to 04/05/2024. Normal background bone mineralization. No other osteolysis. Maintained joint spaces and alignment. No discrete soft tissue swelling or gas. IMPRESSION: 1. Right 5th finger flexed on all views, limiting some detail. 2. Evidence of osteolysis of the tuft of the 5th distal phalanx since 04/05/2024. Consider acute or chronic Osteomyelitis. 3. No other acute osseous abnormality identified. Electronically Signed   By: VEAR Hurst M.D.   On: 09/21/2024 04:05   (Echo, Carotid, EGD, Colonoscopy, ERCP)    Subjective: No complaints  Discharge Exam: Vitals:   09/27/24 0117 09/27/24 0619  BP: 106/66 (!) 106/57  Pulse: 77 70  Resp: 16 17  Temp: 99 F (37.2 C) 98.9 F (37.2 C)  SpO2: 96% 94%   Vitals:   09/26/24 2000 09/26/24 2141 09/27/24 0117 09/27/24 0619  BP:  116/66 106/66 (!) 106/57  Pulse:  67 77 70  Resp: (!) 25 16 16 17   Temp:  99.4 F (37.4 C) 99 F (37.2 C) 98.9 F (37.2 C)  TempSrc:  Oral Oral Oral  SpO2:  95% 96% 94%  Weight:      Height:        General: Pt is alert, awake, not in acute distress Cardiovascular: RRR, S1/S2 +, no rubs, no gallops Respiratory: CTA bilaterally, no wheezing, no rhonchi Abdominal: Soft, NT, ND, bowel sounds + Extremities: no edema, no cyanosis    The results of significant diagnostics from this hospitalization (including imaging, microbiology, ancillary and laboratory) are listed below for reference.     Microbiology: Recent Results (from the past 240 hours)  Resp panel by  RT-PCR (RSV, Flu A&B, Covid) Anterior Nasal Swab     Status: Abnormal   Collection Time: 09/24/24  2:18 PM   Specimen: Anterior Nasal Swab  Result Value Ref Range Status   SARS Coronavirus 2 by RT PCR POSITIVE (A) NEGATIVE Final   Influenza A by PCR NEGATIVE NEGATIVE Final   Influenza B by PCR NEGATIVE NEGATIVE Final    Comment: (NOTE) The Xpert Xpress SARS-CoV-2/FLU/RSV plus assay is intended as an  aid in the diagnosis of influenza from Nasopharyngeal swab specimens and should not be used as a sole basis for treatment. Nasal washings and aspirates are unacceptable for Xpert Xpress SARS-CoV-2/FLU/RSV testing.  Fact Sheet for Patients: BloggerCourse.com  Fact Sheet for Healthcare Providers: SeriousBroker.it  This test is not yet approved or cleared by the [place]  FDA and has been authorized for detection and/or diagnosis of SARS-CoV-2 by FDA under an Emergency Use Authorization (EUA). This EUA will remain in effect (meaning this test can be used) for the duration of the COVID-19 declaration under Section 564(b)(1) of the Act, 21 U.S.C. section 360bbb-3(b)(1), unless the authorization is terminated or revoked.     Resp Syncytial Virus by PCR NEGATIVE NEGATIVE Final    Comment: (NOTE) Fact Sheet for Patients: BloggerCourse.com  Fact Sheet for Healthcare Providers: SeriousBroker.it  This test is not yet approved or cleared by the [place]  FDA and has been authorized for detection and/or diagnosis of SARS-CoV-2 by FDA under an Emergency Use Authorization (EUA). This EUA will remain in effect (meaning this test can be used) for the duration of the COVID-19 declaration under Section 564(b)(1) of the Act, 21 U.S.C. section 360bbb-3(b)(1), unless the authorization is terminated or revoked.  Performed at [facility], [address], [place], [place] [address]   Culture, blood (routine x 2)     Status: None (Preliminary result)   Collection Time: 09/24/24  4:32 PM   Specimen: BLOOD LEFT ARM  Result Value Ref Range Status   Specimen Description BLOOD LEFT ARM  Final   Special Requests   Final    BOTTLES DRAWN AEROBIC AND ANAEROBIC Blood Culture adequate volume   Culture   Final    NO GROWTH 2 DAYS Performed at [facility], [address]  [place], [place] [address]    Report Status PENDING  Incomplete  Culture, blood (routine x 2)     Status: None (Preliminary result)   Collection Time: 09/24/24  4:32 PM   Specimen: BLOOD RIGHT ARM  Result Value Ref Range Status   Specimen Description BLOOD RIGHT ARM  Final   Special Requests   Final    BOTTLES DRAWN AEROBIC AND ANAEROBIC Blood Culture adequate volume   Culture   Final    NO GROWTH 2 DAYS Performed at [facility], [address] [place], [place] [address]    Report Status PENDING  Incomplete     Labs: BNP (last 3 results) No results for input(s): BNP in the last 8760 hours. Basic Metabolic Panel: Recent Labs  Lab 09/24/24 1632 09/25/24 0458  NA 132* 138  K 4.2 3.6  CL 98 109  CO2 22 24  GLUCOSE 107* 97  BUN 25* 13  CREATININE 1.58* 0.99  CALCIUM  8.6* 8.2*   Liver Function Tests: Recent Labs  Lab 09/24/24 1632  AST 35  ALT 19  ALKPHOS 77  BILITOT 0.6  PROT 8.0  ALBUMIN  3.6   No results for input(s): LIPASE, AMYLASE in the last  168 hours. No results for input(s): AMMONIA in the last 168 hours. CBC: Recent Labs  Lab 09/24/24 1632 09/25/24 0458  WBC 18.0* 12.8*  NEUTROABS 14.9*  --   HGB 13.0 10.3*  HCT 38.5* 29.9*  MCV 97.2 97.1  PLT 259 198   Cardiac Enzymes: No results for input(s): CKTOTAL, CKMB, CKMBINDEX, TROPONINI in the last 168 hours. BNP: Invalid input(s): POCBNP CBG: No results for input(s): GLUCAP in the last 168 hours. D-Dimer No results for input(s): DDIMER in the last 72 hours. Hgb A1c No results for input(s): HGBA1C in the last 72 hours. Lipid Profile No results for input(s): CHOL, HDL, LDLCALC, TRIG, CHOLHDL, LDLDIRECT in the last 72 hours. Thyroid  function studies No results for input(s): TSH, T4TOTAL, T3FREE, THYROIDAB in the last 72 hours.  Invalid input(s): FREET3 Anemia work up No results for input(s): VITAMINB12, FOLATE, FERRITIN, TIBC, IRON,  RETICCTPCT in the last 72 hours. Urinalysis    Component Value Date/Time   COLORURINE YELLOW 09/24/2024 1417   APPEARANCEUR CLEAR 09/24/2024 1417   LABSPEC 1.020 09/24/2024 1417   PHURINE 5.0 09/24/2024 1417   GLUCOSEU NEGATIVE 09/24/2024 1417   HGBUR SMALL (A) 09/24/2024 1417   BILIRUBINUR NEGATIVE 09/24/2024 1417   KETONESUR NEGATIVE 09/24/2024 1417   PROTEINUR 30 (A) 09/24/2024 1417   NITRITE NEGATIVE 09/24/2024 1417   LEUKOCYTESUR NEGATIVE 09/24/2024 1417   Sepsis Labs Recent Labs  Lab 09/24/24 1632 09/25/24 0458  WBC 18.0* 12.8*   Microbiology Recent Results (from the past 240 hours)  Resp panel by RT-PCR (RSV, Flu A&B, Covid) Anterior Nasal Swab     Status: Abnormal   Collection Time: 09/24/24  2:18 PM   Specimen: Anterior Nasal Swab  Result Value Ref Range Status   SARS Coronavirus 2 by RT PCR POSITIVE (A) NEGATIVE Final   Influenza A by PCR NEGATIVE NEGATIVE Final   Influenza B by PCR NEGATIVE NEGATIVE Final    Comment: (NOTE) The Xpert Xpress SARS-CoV-2/FLU/RSV plus assay is intended as an aid in the diagnosis of influenza from Nasopharyngeal swab specimens and should not be used as a sole basis for treatment. Nasal washings and aspirates are unacceptable for Xpert Xpress SARS-CoV-2/FLU/RSV testing.  Fact Sheet for Patients: BloggerCourse.com  Fact Sheet for Healthcare Providers: SeriousBroker.it  This test is not yet approved or cleared by the [place]  FDA and has been authorized for detection and/or diagnosis of SARS-CoV-2 by FDA under an Emergency Use Authorization (EUA). This EUA will remain in effect (meaning this test can be used) for the duration of the COVID-19 declaration under Section 564(b)(1) of the Act, 21 U.S.C. section 360bbb-3(b)(1), unless the authorization is terminated or revoked.     Resp Syncytial Virus by PCR NEGATIVE NEGATIVE Final    Comment: (NOTE) Fact Sheet for  Patients: BloggerCourse.com  Fact Sheet for Healthcare Providers: SeriousBroker.it  This test is not yet approved or cleared by the [place]  FDA and has been authorized for detection and/or diagnosis of SARS-CoV-2 by FDA under an Emergency Use Authorization (EUA). This EUA will remain in effect (meaning this test can be used) for the duration of the COVID-19 declaration under Section 564(b)(1) of the Act, 21 U.S.C. section 360bbb-3(b)(1), unless the authorization is terminated or revoked.  Performed at [facility], [address] [place], [place] [address]   Culture, blood (routine x 2)     Status: None (Preliminary result)   Collection Time: 09/24/24  4:32 PM   Specimen: BLOOD LEFT ARM  Result Value Ref  Range Status   Specimen Description BLOOD LEFT ARM  Final   Special Requests   Final    BOTTLES DRAWN AEROBIC AND ANAEROBIC Blood Culture adequate volume   Culture   Final    NO GROWTH 2 DAYS Performed at [facility], [address] [place], [place] [address]    Report Status PENDING  Incomplete  Culture, blood (routine x 2)     Status: None (Preliminary result)   Collection Time: 09/24/24  4:32 PM   Specimen: BLOOD RIGHT ARM  Result Value Ref Range Status   Specimen Description BLOOD RIGHT ARM  Final   Special Requests   Final    BOTTLES DRAWN AEROBIC AND ANAEROBIC Blood Culture adequate volume   Culture   Final    NO GROWTH 2 DAYS Performed at [facility], [address] [place], [place] [address]    Report Status PENDING  Incomplete     Time coordinating discharge: Over 35 minutes  SIGNED:   Erle Odell Castor, MD  [facility] 09/27/2024, 7:20 AM Pager   If 7PM-7AM, please contact night-coverage [email or web address] Password [email or web address]

## 2024-09-27 NOTE — Plan of Care (Signed)
  Problem: Fluid Volume: Goal: Hemodynamic stability will improve 09/27/2024 0953 by Mila Fairy SAUNDERS, RN Outcome: Adequate for Discharge 09/27/2024 0803 by Mila Fairy SAUNDERS, RN Outcome: Progressing   Problem: Clinical Measurements: Goal: Diagnostic test results will improve 09/27/2024 0953 by Mila Fairy SAUNDERS, RN Outcome: Adequate for Discharge 09/27/2024 0803 by Mila Fairy SAUNDERS, RN Outcome: Progressing Goal: Signs and symptoms of infection will decrease 09/27/2024 0953 by Mila Fairy SAUNDERS, RN Outcome: Adequate for Discharge 09/27/2024 0803 by Mila Fairy SAUNDERS, RN Outcome: Progressing   Problem: Respiratory: Goal: Ability to maintain adequate ventilation will improve 09/27/2024 0953 by Mila Fairy SAUNDERS, RN Outcome: Adequate for Discharge 09/27/2024 0803 by Mila Fairy SAUNDERS, RN Outcome: Progressing   Problem: Education: Goal: Knowledge of General Education information will improve Description: Including pain rating scale, medication(s)/side effects and non-pharmacologic comfort measures 09/27/2024 0953 by Mila Fairy SAUNDERS, RN Outcome: Adequate for Discharge 09/27/2024 0803 by Mila Fairy SAUNDERS, RN Outcome: Progressing   Problem: Health Behavior/Discharge Planning: Goal: Ability to manage health-related needs will improve 09/27/2024 0953 by Mila Fairy SAUNDERS, RN Outcome: Adequate for Discharge 09/27/2024 0803 by Mila Fairy SAUNDERS, RN Outcome: Progressing   Problem: Clinical Measurements: Goal: Ability to maintain clinical measurements within normal limits will improve 09/27/2024 0953 by Mila Fairy SAUNDERS, RN Outcome: Adequate for Discharge 09/27/2024 0803 by Mila Fairy SAUNDERS, RN Outcome: Progressing Goal: Will remain free from infection 09/27/2024 0953 by Mila Fairy SAUNDERS, RN Outcome: Adequate for Discharge 09/27/2024 0803 by Mila Fairy SAUNDERS, RN Outcome: Progressing Goal: Diagnostic test results will improve 09/27/2024 0953 by Mila Fairy SAUNDERS, RN Outcome: Adequate  for Discharge 09/27/2024 0803 by Mila Fairy SAUNDERS, RN Outcome: Progressing Goal: Respiratory complications will improve 09/27/2024 0953 by Mila Fairy SAUNDERS, RN Outcome: Adequate for Discharge 09/27/2024 0803 by Mila Fairy SAUNDERS, RN Outcome: Progressing Goal: Cardiovascular complication will be avoided 09/27/2024 0953 by Mila Fairy SAUNDERS, RN Outcome: Adequate for Discharge 09/27/2024 0803 by Mila Fairy SAUNDERS, RN Outcome: Progressing   Problem: Activity: Goal: Risk for activity intolerance will decrease 09/27/2024 0953 by Mila Fairy SAUNDERS, RN Outcome: Adequate for Discharge 09/27/2024 0803 by Mila Fairy SAUNDERS, RN Outcome: Progressing   Problem: Nutrition: Goal: Adequate nutrition will be maintained 09/27/2024 0953 by Mila Fairy SAUNDERS, RN Outcome: Adequate for Discharge 09/27/2024 0803 by Mila Fairy SAUNDERS, RN Outcome: Progressing   Problem: Coping: Goal: Level of anxiety will decrease 09/27/2024 0953 by Mila Fairy SAUNDERS, RN Outcome: Adequate for Discharge 09/27/2024 0803 by Mila Fairy SAUNDERS, RN Outcome: Progressing   Problem: Elimination: Goal: Will not experience complications related to bowel motility 09/27/2024 0953 by Mila Fairy SAUNDERS, RN Outcome: Adequate for Discharge 09/27/2024 0803 by Mila Fairy SAUNDERS, RN Outcome: Progressing Goal: Will not experience complications related to urinary retention 09/27/2024 0953 by Mila Fairy SAUNDERS, RN Outcome: Adequate for Discharge 09/27/2024 0803 by Mila Fairy SAUNDERS, RN Outcome: Progressing   Problem: Pain Managment: Goal: General experience of comfort will improve and/or be controlled 09/27/2024 0953 by Mila Fairy SAUNDERS, RN Outcome: Adequate for Discharge 09/27/2024 0803 by Mila Fairy SAUNDERS, RN Outcome: Progressing   Problem: Safety: Goal: Ability to remain free from injury will improve 09/27/2024 0953 by Mila Fairy SAUNDERS, RN Outcome: Adequate for Discharge 09/27/2024 0803 by Mila Fairy SAUNDERS, RN Outcome: Progressing    Problem: Skin Integrity: Goal: Risk for impaired skin integrity will decrease 09/27/2024 0953 by Mila Fairy SAUNDERS, RN Outcome: Adequate for Discharge 09/27/2024 0803 by Mila Fairy SAUNDERS, RN Outcome: Progressing

## 2024-09-27 NOTE — Plan of Care (Signed)

## 2024-09-29 LAB — CULTURE, BLOOD (ROUTINE X 2)
Culture: NO GROWTH
Culture: NO GROWTH
Special Requests: ADEQUATE
Special Requests: ADEQUATE

## 2024-10-20 ENCOUNTER — Other Ambulatory Visit: Payer: Self-pay

## 2024-10-20 ENCOUNTER — Emergency Department (HOSPITAL_COMMUNITY)
Admission: EM | Admit: 2024-10-20 | Discharge: 2024-10-20 | Disposition: A | Discharge status: Home / Self Care | Attending: Emergency Medicine | Admitting: Emergency Medicine

## 2024-10-20 ENCOUNTER — Encounter (HOSPITAL_COMMUNITY): Payer: Self-pay

## 2024-10-20 DIAGNOSIS — S0990XA Unspecified injury of head, initial encounter: Secondary | ICD-10-CM | POA: Insufficient documentation

## 2024-10-20 DIAGNOSIS — S41132A Puncture wound without foreign body of left upper arm, initial encounter: Secondary | ICD-10-CM | POA: Diagnosis not present

## 2024-10-20 DIAGNOSIS — S41131A Puncture wound without foreign body of right upper arm, initial encounter: Secondary | ICD-10-CM | POA: Diagnosis not present

## 2024-10-20 DIAGNOSIS — Z91010 Allergy to peanuts: Secondary | ICD-10-CM | POA: Insufficient documentation

## 2024-10-20 DIAGNOSIS — S4991XA Unspecified injury of right shoulder and upper arm, initial encounter: Secondary | ICD-10-CM | POA: Diagnosis present

## 2024-10-20 MED ORDER — DOXYCYCLINE HYCLATE 100 MG PO CAPS: 100.0000 mg | ORAL_CAPSULE | Freq: Two times a day (BID) | ORAL | 0 refills | Status: AC
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
## 2024-10-20 NOTE — Discharge Instructions (Signed)
 Please return for sudden worsening headache, confusion, vomiting

## 2024-10-21 ENCOUNTER — Emergency Department (HOSPITAL_COMMUNITY)
Admission: EM | Admit: 2024-10-21 | Discharge: 2024-10-21 | Discharge status: Against Medical Advice | Attending: Emergency Medicine | Admitting: Emergency Medicine

## 2024-10-21 ENCOUNTER — Other Ambulatory Visit: Payer: Self-pay

## 2024-10-21 ENCOUNTER — Encounter (HOSPITAL_COMMUNITY): Payer: Self-pay

## 2024-10-21 DIAGNOSIS — R519 Headache, unspecified: Secondary | ICD-10-CM | POA: Diagnosis present

## 2024-10-21 DIAGNOSIS — Z5321 Procedure and treatment not carried out due to patient leaving prior to being seen by health care provider: Secondary | ICD-10-CM | POA: Insufficient documentation

## 2024-10-21 LAB — RESP PANEL BY RT-PCR (RSV, FLU A&B, COVID)  RVPGX2
Influenza A by PCR: NEGATIVE
Influenza B by PCR: NEGATIVE
Resp Syncytial Virus by PCR: NEGATIVE
SARS Coronavirus 2 by RT PCR: NEGATIVE

## 2024-10-21 NOTE — ED Triage Notes (Signed)
 Says he continues to have a headache and fears his blood is not circulating properly.

## 2024-10-21 NOTE — ED Notes (Signed)
 PT called for room, no response

## 2024-11-04 ENCOUNTER — Encounter (HOSPITAL_BASED_OUTPATIENT_CLINIC_OR_DEPARTMENT_OTHER): Admitting: Internal Medicine

## 2024-11-23 ENCOUNTER — Ambulatory Visit: Admitting: Family

## 2025-09-29 ENCOUNTER — Ambulatory Visit: Admitting: Family
